# Patient Record
Sex: Male | Born: 1980 | Race: White | Hispanic: No | Smoking: Current every day smoker
Health system: Southern US, Community
[De-identification: ages and names within clinical notes are randomized; demographics above are authoritative.]

## PROBLEM LIST (undated history)

## (undated) DIAGNOSIS — I82409 Acute embolism and thrombosis of unspecified deep veins of unspecified lower extremity: Secondary | ICD-10-CM

## (undated) DIAGNOSIS — J449 Chronic obstructive pulmonary disease, unspecified: Secondary | ICD-10-CM

## (undated) HISTORY — PX: CORONARY ARTERY BYPASS GRAFT: SHX141

## (undated) HISTORY — PX: HERNIA REPAIR: SHX51

---

## 2002-07-24 ENCOUNTER — Encounter: Admission: RE | Admit: 2002-07-24 | Discharge: 2002-09-03 | Payer: Self-pay | Admitting: Orthopedic Surgery

## 2020-12-02 DIAGNOSIS — I361 Nonrheumatic tricuspid (valve) insufficiency: Secondary | ICD-10-CM

## 2021-01-27 ENCOUNTER — Emergency Department (HOSPITAL_COMMUNITY): Payer: Self-pay

## 2021-01-27 ENCOUNTER — Other Ambulatory Visit: Payer: Self-pay

## 2021-01-27 ENCOUNTER — Emergency Department (HOSPITAL_BASED_OUTPATIENT_CLINIC_OR_DEPARTMENT_OTHER): Payer: Self-pay

## 2021-01-27 ENCOUNTER — Inpatient Hospital Stay (HOSPITAL_COMMUNITY)
Admission: EM | Admit: 2021-01-27 | Discharge: 2021-03-01 | DRG: 216 | Disposition: A | Discharge status: Home Health | Payer: Self-pay | Attending: Surgery | Admitting: Surgery

## 2021-01-27 ENCOUNTER — Encounter (HOSPITAL_COMMUNITY): Payer: Self-pay | Admitting: Radiology

## 2021-01-27 DIAGNOSIS — K029 Dental caries, unspecified: Secondary | ICD-10-CM | POA: Diagnosis present

## 2021-01-27 DIAGNOSIS — Z597 Insufficient social insurance and welfare support: Secondary | ICD-10-CM

## 2021-01-27 DIAGNOSIS — T826XXA Infection and inflammatory reaction due to cardiac valve prosthesis, initial encounter: Secondary | ICD-10-CM

## 2021-01-27 DIAGNOSIS — D72829 Elevated white blood cell count, unspecified: Secondary | ICD-10-CM | POA: Diagnosis present

## 2021-01-27 DIAGNOSIS — R718 Other abnormality of red blood cells: Secondary | ICD-10-CM | POA: Diagnosis present

## 2021-01-27 DIAGNOSIS — E43 Unspecified severe protein-calorie malnutrition: Secondary | ICD-10-CM | POA: Diagnosis present

## 2021-01-27 DIAGNOSIS — R7989 Other specified abnormal findings of blood chemistry: Secondary | ICD-10-CM | POA: Insufficient documentation

## 2021-01-27 DIAGNOSIS — F17210 Nicotine dependence, cigarettes, uncomplicated: Secondary | ICD-10-CM | POA: Diagnosis present

## 2021-01-27 DIAGNOSIS — D509 Iron deficiency anemia, unspecified: Secondary | ICD-10-CM

## 2021-01-27 DIAGNOSIS — I440 Atrioventricular block, first degree: Secondary | ICD-10-CM | POA: Diagnosis present

## 2021-01-27 DIAGNOSIS — I083 Combined rheumatic disorders of mitral, aortic and tricuspid valves: Secondary | ICD-10-CM | POA: Diagnosis present

## 2021-01-27 DIAGNOSIS — I4510 Unspecified right bundle-branch block: Secondary | ICD-10-CM | POA: Diagnosis present

## 2021-01-27 DIAGNOSIS — Z86711 Personal history of pulmonary embolism: Secondary | ICD-10-CM

## 2021-01-27 DIAGNOSIS — E872 Acidosis, unspecified: Secondary | ICD-10-CM | POA: Diagnosis present

## 2021-01-27 DIAGNOSIS — Z20822 Contact with and (suspected) exposure to covid-19: Secondary | ICD-10-CM | POA: Diagnosis present

## 2021-01-27 DIAGNOSIS — T380X5A Adverse effect of glucocorticoids and synthetic analogues, initial encounter: Secondary | ICD-10-CM | POA: Diagnosis present

## 2021-01-27 DIAGNOSIS — Z720 Tobacco use: Secondary | ICD-10-CM

## 2021-01-27 DIAGNOSIS — D62 Acute posthemorrhagic anemia: Secondary | ICD-10-CM | POA: Diagnosis not present

## 2021-01-27 DIAGNOSIS — I2721 Secondary pulmonary arterial hypertension: Secondary | ICD-10-CM | POA: Diagnosis present

## 2021-01-27 DIAGNOSIS — I82409 Acute embolism and thrombosis of unspecified deep veins of unspecified lower extremity: Secondary | ICD-10-CM

## 2021-01-27 DIAGNOSIS — K036 Deposits [accretions] on teeth: Secondary | ICD-10-CM

## 2021-01-27 DIAGNOSIS — I5023 Acute on chronic systolic (congestive) heart failure: Secondary | ICD-10-CM | POA: Diagnosis present

## 2021-01-27 DIAGNOSIS — Z01818 Encounter for other preprocedural examination: Secondary | ICD-10-CM

## 2021-01-27 DIAGNOSIS — I2510 Atherosclerotic heart disease of native coronary artery without angina pectoris: Secondary | ICD-10-CM | POA: Diagnosis present

## 2021-01-27 DIAGNOSIS — I252 Old myocardial infarction: Secondary | ICD-10-CM

## 2021-01-27 DIAGNOSIS — J440 Chronic obstructive pulmonary disease with acute lower respiratory infection: Secondary | ICD-10-CM | POA: Diagnosis present

## 2021-01-27 DIAGNOSIS — I214 Non-ST elevation (NSTEMI) myocardial infarction: Principal | ICD-10-CM | POA: Diagnosis present

## 2021-01-27 DIAGNOSIS — I6340 Cerebral infarction due to embolism of unspecified cerebral artery: Secondary | ICD-10-CM | POA: Diagnosis not present

## 2021-01-27 DIAGNOSIS — R509 Fever, unspecified: Secondary | ICD-10-CM

## 2021-01-27 DIAGNOSIS — Z452 Encounter for adjustment and management of vascular access device: Secondary | ICD-10-CM

## 2021-01-27 DIAGNOSIS — J441 Chronic obstructive pulmonary disease with (acute) exacerbation: Secondary | ICD-10-CM | POA: Diagnosis present

## 2021-01-27 DIAGNOSIS — K0530 Chronic periodontitis, unspecified: Secondary | ICD-10-CM

## 2021-01-27 DIAGNOSIS — I059 Rheumatic mitral valve disease, unspecified: Secondary | ICD-10-CM

## 2021-01-27 DIAGNOSIS — I2699 Other pulmonary embolism without acute cor pulmonale: Secondary | ICD-10-CM

## 2021-01-27 DIAGNOSIS — I82433 Acute embolism and thrombosis of popliteal vein, bilateral: Secondary | ICD-10-CM

## 2021-01-27 DIAGNOSIS — N179 Acute kidney failure, unspecified: Secondary | ICD-10-CM | POA: Diagnosis present

## 2021-01-27 DIAGNOSIS — E875 Hyperkalemia: Secondary | ICD-10-CM | POA: Diagnosis present

## 2021-01-27 DIAGNOSIS — I708 Atherosclerosis of other arteries: Secondary | ICD-10-CM | POA: Diagnosis present

## 2021-01-27 DIAGNOSIS — R011 Cardiac murmur, unspecified: Secondary | ICD-10-CM | POA: Diagnosis present

## 2021-01-27 DIAGNOSIS — Z8249 Family history of ischemic heart disease and other diseases of the circulatory system: Secondary | ICD-10-CM

## 2021-01-27 DIAGNOSIS — L8990 Pressure ulcer of unspecified site, unspecified stage: Secondary | ICD-10-CM | POA: Diagnosis present

## 2021-01-27 DIAGNOSIS — J4520 Mild intermittent asthma, uncomplicated: Secondary | ICD-10-CM

## 2021-01-27 DIAGNOSIS — J90 Pleural effusion, not elsewhere classified: Secondary | ICD-10-CM

## 2021-01-27 DIAGNOSIS — J45909 Unspecified asthma, uncomplicated: Secondary | ICD-10-CM

## 2021-01-27 DIAGNOSIS — K045 Chronic apical periodontitis: Secondary | ICD-10-CM | POA: Diagnosis present

## 2021-01-27 DIAGNOSIS — E871 Hypo-osmolality and hyponatremia: Secondary | ICD-10-CM | POA: Diagnosis present

## 2021-01-27 DIAGNOSIS — R197 Diarrhea, unspecified: Secondary | ICD-10-CM | POA: Diagnosis present

## 2021-01-27 DIAGNOSIS — I255 Ischemic cardiomyopathy: Secondary | ICD-10-CM | POA: Diagnosis present

## 2021-01-27 DIAGNOSIS — K08109 Complete loss of teeth, unspecified cause, unspecified class: Secondary | ICD-10-CM

## 2021-01-27 DIAGNOSIS — E8809 Other disorders of plasma-protein metabolism, not elsewhere classified: Secondary | ICD-10-CM | POA: Diagnosis present

## 2021-01-27 DIAGNOSIS — I639 Cerebral infarction, unspecified: Secondary | ICD-10-CM

## 2021-01-27 DIAGNOSIS — J9601 Acute respiratory failure with hypoxia: Secondary | ICD-10-CM | POA: Diagnosis present

## 2021-01-27 DIAGNOSIS — I330 Acute and subacute infective endocarditis: Secondary | ICD-10-CM

## 2021-01-27 DIAGNOSIS — R570 Cardiogenic shock: Secondary | ICD-10-CM | POA: Diagnosis not present

## 2021-01-27 DIAGNOSIS — J449 Chronic obstructive pulmonary disease, unspecified: Secondary | ICD-10-CM

## 2021-01-27 DIAGNOSIS — R778 Other specified abnormalities of plasma proteins: Secondary | ICD-10-CM

## 2021-01-27 DIAGNOSIS — I472 Ventricular tachycardia, unspecified: Secondary | ICD-10-CM | POA: Diagnosis present

## 2021-01-27 DIAGNOSIS — I248 Other forms of acute ischemic heart disease: Secondary | ICD-10-CM | POA: Insufficient documentation

## 2021-01-27 DIAGNOSIS — Z951 Presence of aortocoronary bypass graft: Secondary | ICD-10-CM

## 2021-01-27 DIAGNOSIS — Z6821 Body mass index (BMI) 21.0-21.9, adult: Secondary | ICD-10-CM

## 2021-01-27 DIAGNOSIS — I4729 Other ventricular tachycardia: Secondary | ICD-10-CM

## 2021-01-27 DIAGNOSIS — I38 Endocarditis, valve unspecified: Secondary | ICD-10-CM

## 2021-01-27 DIAGNOSIS — R17 Unspecified jaundice: Secondary | ICD-10-CM

## 2021-01-27 DIAGNOSIS — Z8673 Personal history of transient ischemic attack (TIA), and cerebral infarction without residual deficits: Secondary | ICD-10-CM

## 2021-01-27 DIAGNOSIS — K030 Excessive attrition of teeth: Secondary | ICD-10-CM | POA: Diagnosis present

## 2021-01-27 DIAGNOSIS — Z7901 Long term (current) use of anticoagulants: Secondary | ICD-10-CM

## 2021-01-27 DIAGNOSIS — K000 Anodontia: Secondary | ICD-10-CM | POA: Diagnosis present

## 2021-01-27 DIAGNOSIS — Z91041 Radiographic dye allergy status: Secondary | ICD-10-CM

## 2021-01-27 DIAGNOSIS — I5021 Acute systolic (congestive) heart failure: Secondary | ICD-10-CM

## 2021-01-27 DIAGNOSIS — K083 Retained dental root: Secondary | ICD-10-CM | POA: Diagnosis present

## 2021-01-27 DIAGNOSIS — I82403 Acute embolism and thrombosis of unspecified deep veins of lower extremity, bilateral: Secondary | ICD-10-CM

## 2021-01-27 DIAGNOSIS — Z86718 Personal history of other venous thrombosis and embolism: Secondary | ICD-10-CM

## 2021-01-27 DIAGNOSIS — Z01811 Encounter for preprocedural respiratory examination: Secondary | ICD-10-CM

## 2021-01-27 DIAGNOSIS — I110 Hypertensive heart disease with heart failure: Secondary | ICD-10-CM | POA: Diagnosis present

## 2021-01-27 DIAGNOSIS — I25810 Atherosclerosis of coronary artery bypass graft(s) without angina pectoris: Secondary | ICD-10-CM | POA: Diagnosis present

## 2021-01-27 DIAGNOSIS — Z952 Presence of prosthetic heart valve: Secondary | ICD-10-CM

## 2021-01-27 DIAGNOSIS — I340 Nonrheumatic mitral (valve) insufficiency: Secondary | ICD-10-CM

## 2021-01-27 DIAGNOSIS — J189 Pneumonia, unspecified organism: Secondary | ICD-10-CM | POA: Diagnosis present

## 2021-01-27 HISTORY — DX: Acute embolism and thrombosis of unspecified deep veins of unspecified lower extremity: I82.409

## 2021-01-27 HISTORY — DX: Chronic obstructive pulmonary disease, unspecified: J44.9

## 2021-01-27 LAB — COMPREHENSIVE METABOLIC PANEL
ALT: 28 U/L (ref 0–44)
AST: 35 U/L (ref 15–41)
Albumin: 2.7 g/dL — ABNORMAL LOW (ref 3.5–5.0)
Alkaline Phosphatase: 101 U/L (ref 38–126)
Anion gap: 12 (ref 5–15)
BUN: 23 mg/dL — ABNORMAL HIGH (ref 6–20)
CO2: 22 mmol/L (ref 22–32)
Calcium: 8.2 mg/dL — ABNORMAL LOW (ref 8.9–10.3)
Chloride: 99 mmol/L (ref 98–111)
Creatinine, Ser: 1.39 mg/dL — ABNORMAL HIGH (ref 0.61–1.24)
GFR, Estimated: >60 mL/min (ref >60)
Glucose, Bld: 119 mg/dL — ABNORMAL HIGH (ref 70–99)
Potassium: 3.5 mmol/L (ref 3.5–5.1)
Sodium: 133 mmol/L — ABNORMAL LOW (ref 135–145)
Total Bilirubin: 6.3 mg/dL — ABNORMAL HIGH (ref 0.3–1.2)
Total Protein: 5.5 g/dL — ABNORMAL LOW (ref 6.5–8.1)

## 2021-01-27 LAB — URINALYSIS, ROUTINE W REFLEX MICROSCOPIC
Bilirubin Urine: NEGATIVE
Glucose, UA: NEGATIVE mg/dL
Hgb urine dipstick: NEGATIVE
Ketones, ur: NEGATIVE mg/dL
Leukocytes,Ua: NEGATIVE
Nitrite: NEGATIVE
Protein, ur: NEGATIVE mg/dL
Specific Gravity, Urine: 1.006 (ref 1.005–1.030)
pH: 6 (ref 5.0–8.0)

## 2021-01-27 LAB — CBC
HCT: 36.9 % — ABNORMAL LOW (ref 39.0–52.0)
Hemoglobin: 10.1 g/dL — ABNORMAL LOW (ref 13.0–17.0)
MCH: 19.2 pg — ABNORMAL LOW (ref 26.0–34.0)
MCHC: 27.4 g/dL — ABNORMAL LOW (ref 30.0–36.0)
MCV: 70.3 fL — ABNORMAL LOW (ref 80.0–100.0)
Platelets: 195 10*3/uL (ref 150–400)
RBC: 5.25 MIL/uL (ref 4.22–5.81)
RDW: 22.8 % — ABNORMAL HIGH (ref 11.5–15.5)
WBC: 12.6 10*3/uL — ABNORMAL HIGH (ref 4.0–10.5)
nRBC: 0.3 % — ABNORMAL HIGH (ref 0.0–0.2)

## 2021-01-27 LAB — LACTIC ACID, PLASMA
Lactic Acid, Venous: 3.7 mmol/L (ref 0.5–1.9)
Lactic Acid, Venous: 3.8 mmol/L (ref 0.5–1.9)
Lactic Acid, Venous: 2.5 mmol/L (ref 0.5–1.9)

## 2021-01-27 LAB — BRAIN NATRIURETIC PEPTIDE: B Natriuretic Peptide: 1733 pg/mL — ABNORMAL HIGH (ref 0.0–100.0)

## 2021-01-27 LAB — TROPONIN I (HIGH SENSITIVITY)
Troponin I (High Sensitivity): 99 ng/L — ABNORMAL HIGH (ref <18)
Troponin I (High Sensitivity): 108 ng/L (ref <18)
Troponin I (High Sensitivity): 105 ng/L (ref <18)

## 2021-01-27 LAB — RESP PANEL BY RT-PCR (FLU A&B, COVID) ARPGX2
Influenza A by PCR: NEGATIVE
Influenza B by PCR: NEGATIVE
SARS Coronavirus 2 by RT PCR: NEGATIVE

## 2021-01-27 LAB — PROTIME-INR
INR: 2.5 — ABNORMAL HIGH (ref 0.8–1.2)
Prothrombin Time: 26.7 seconds — ABNORMAL HIGH (ref 11.4–15.2)

## 2021-01-27 MED ORDER — BOOST / RESOURCE BREEZE PO LIQD CUSTOM
1.0000 | Freq: Three times a day (TID) | ORAL | Status: DC
  Administered 2021-01-28: 1 via ORAL
  Filled 2021-01-27: qty 1

## 2021-01-27 MED ORDER — METHYLPREDNISOLONE SODIUM SUCC 40 MG IJ SOLR
40.0000 mg | Freq: Two times a day (BID) | INTRAMUSCULAR | Status: DC
  Administered 2021-01-27 – 2021-01-28 (×2): 40 mg via INTRAVENOUS
  Filled 2021-01-27 (×2): qty 1

## 2021-01-27 MED ORDER — SODIUM CHLORIDE 0.9 % IV SOLN
500.0000 mg | INTRAVENOUS | Status: DC
  Administered 2021-01-27: 500 mg via INTRAVENOUS
  Filled 2021-01-27 (×2): qty 500

## 2021-01-27 MED ORDER — DIPHENHYDRAMINE HCL 25 MG PO CAPS
50.0000 mg | ORAL_CAPSULE | Freq: Once | ORAL | Status: AC
  Administered 2021-01-27: 50 mg via ORAL
  Filled 2021-01-27: qty 2

## 2021-01-27 MED ORDER — METHYLPREDNISOLONE SODIUM SUCC 125 MG IJ SOLR
125.0000 mg | Freq: Once | INTRAMUSCULAR | Status: AC
  Administered 2021-01-27: 125 mg via INTRAVENOUS
  Filled 2021-01-27: qty 2

## 2021-01-27 MED ORDER — HEPARIN BOLUS VIA INFUSION
5100.0000 [IU] | Freq: Once | INTRAVENOUS | Status: AC
  Administered 2021-01-27: 5100 [IU] via INTRAVENOUS
  Filled 2021-01-27: qty 5100

## 2021-01-27 MED ORDER — FUROSEMIDE 10 MG/ML IJ SOLN
40.0000 mg | Freq: Two times a day (BID) | INTRAMUSCULAR | Status: DC
  Administered 2021-01-28 – 2021-01-30 (×5): 40 mg via INTRAVENOUS
  Filled 2021-01-27 (×5): qty 4

## 2021-01-27 MED ORDER — SODIUM CHLORIDE 0.9 % IV SOLN
1.0000 g | INTRAVENOUS | Status: DC
  Filled 2021-01-27: qty 10

## 2021-01-27 MED ORDER — METHYLPREDNISOLONE SODIUM SUCC 125 MG IJ SOLR
75.0000 mg | Freq: Once | INTRAMUSCULAR | Status: AC
  Administered 2021-01-27: 75 mg via INTRAVENOUS
  Filled 2021-01-27: qty 2

## 2021-01-27 MED ORDER — FUROSEMIDE 10 MG/ML IJ SOLN
20.0000 mg | Freq: Once | INTRAMUSCULAR | Status: AC
  Administered 2021-01-27: 20 mg via INTRAVENOUS
  Filled 2021-01-27: qty 2

## 2021-01-27 MED ORDER — FUROSEMIDE 10 MG/ML IJ SOLN
40.0000 mg | Freq: Once | INTRAMUSCULAR | Status: AC
  Administered 2021-01-27: 40 mg via INTRAVENOUS
  Filled 2021-01-27: qty 4

## 2021-01-27 MED ORDER — SODIUM CHLORIDE 0.9 % IV SOLN
1.0000 g | Freq: Once | INTRAVENOUS | Status: AC
  Administered 2021-01-27: 1 g via INTRAVENOUS
  Filled 2021-01-27: qty 10

## 2021-01-27 MED ORDER — HEPARIN (PORCINE) 25000 UT/250ML-% IV SOLN
1500.0000 [IU]/h | INTRAVENOUS | Status: DC
  Administered 2021-01-27: 1250 [IU]/h via INTRAVENOUS
  Administered 2021-01-28: 1350 [IU]/h via INTRAVENOUS
  Filled 2021-01-27 (×2): qty 250

## 2021-01-27 MED ORDER — PANTOPRAZOLE SODIUM 40 MG PO TBEC
40.0000 mg | DELAYED_RELEASE_TABLET | Freq: Every day | ORAL | Status: DC
  Administered 2021-01-27 – 2021-02-16 (×21): 40 mg via ORAL
  Filled 2021-01-27 (×21): qty 1

## 2021-01-27 MED ORDER — IPRATROPIUM-ALBUTEROL 0.5-2.5 (3) MG/3ML IN SOLN
3.0000 mL | Freq: Once | RESPIRATORY_TRACT | Status: AC
  Administered 2021-01-27: 3 mL via RESPIRATORY_TRACT
  Filled 2021-01-27: qty 3

## 2021-01-27 MED ORDER — IOHEXOL 350 MG/ML SOLN
75.0000 mL | Freq: Once | INTRAVENOUS | Status: AC | PRN
  Administered 2021-01-27: 75 mL via INTRAVENOUS

## 2021-01-27 MED ORDER — DM-GUAIFENESIN ER 30-600 MG PO TB12
1.0000 | ORAL_TABLET | Freq: Two times a day (BID) | ORAL | Status: DC
  Administered 2021-01-27 – 2021-02-16 (×40): 1 via ORAL
  Filled 2021-01-27 (×43): qty 1

## 2021-01-27 NOTE — ED Triage Notes (Signed)
Pt arrives via EMS for general weakness and SOB x several days. Pt reports n/v/d x 3 days. States he hasn't eaten in 3 days because he was unable to get to any food. Also c/o throbbing in left arm last night and states he almost passed out on the toilet.

## 2021-01-27 NOTE — H&P (Signed)
History and Physical  Mark Garrett ZDG:[id number] DOB: [birth year] DOA: 01/27/2021  Referring physician: Dwaine Gale, DO PCP: Pcp, No  Patient coming from: Home  Chief Complaint: Shortness of breath and weakness  HPI: Mark Garrett is a 40 y.o. male with medical history significant for asthma/COPD, DVT, tobacco abuse who presents to the emergency department due to 3-day onset of increasing shortness of breath and chest pain, he complained of left-sided throbbing chest pain yesterday with radiation to the left shoulder and arm.  He complained of 3-day onset of leg swelling and states that he ran out of his Xarelto few days ago.  He complained of cough with occasional sputum production and uncertain sputum:, Patient also complained of 3-day onset of diarrhea with several episodes daily.  EMS was activated, on arrival of EMS team, he was noted to be hypotensive and IV fluids was given en route with some improvement (per ED medical record).  He denies fever, chills, nausea, vomiting, headache, abdominal pain.  ED Course:  In the emergency department, he was hemodynamically stable, but O2 sat was 87% on room air and patient was provided with supplemental oxygen via [place] at 4 LPM to maintain O2 sat of 92-100%.  Work-up in the ED showed leukocytosis, microcytic anemia, hyponatremia, BUN/creatinine 23/1.39 (no prior labs for comparison).  Calcium 8.2, albumin 2.7, T bili 6.3 BNP 1733, troponin x2-99 > 108.  Lactic acid - 3.7 > 3.8, urinalysis was negative, influenza A, B, SARS coronavirus 2 was negative. Chest x-ray showed persistent patchy airspace opacities in the right lower lung, overall increased primary differential considerations include infection or alveolar hemorrhage given findings on prior CT. Probable small right pleural effusion with fluid tracking along the fissure. Bilateral lower extremity ultrasound showed age indeterminate DVT CT angiography chest with contrast showed no evidence of  central pulmonary embolism.Right middle lobe and multiple small bilateral lower lobe opacities concerning for pneumonia. Moderate right pleural effusion. Patient was empirically started on IV ceftriaxone and azithromycin due to presumed CAP, IV Lasix was given, breathing treatment was provided, Solu-Medrol was given.  Patient was started on a heparin drip.  Hospitalist was asked to admit patient for further evaluation and management.  Review of Systems: Constitutional: Negative for chills and fever.  HENT: Negative for ear pain and sore throat.   Eyes: Negative for pain and visual disturbance.  Respiratory: Positive for cough and shortness of breath.   Cardiovascular: Positive for chest pain and leg swelling.  Negative for palpitations.  Gastrointestinal: Positive for diarrhea.  Negative for abdominal pain and vomiting.  Endocrine: Negative for polyphagia and polyuria.  Genitourinary: Negative for decreased urine volume, dysuria, enuresis Musculoskeletal: Negative for arthralgias and back pain.  Skin: Negative for color change and rash.  Allergic/Immunologic: Negative for immunocompromised state.  Neurological: Positive for weakness.  Negative for tremors, speech difficulty and headaches.  Hematological: Does not bruise/bleed easily.  All other systems reviewed and are negative   Past medical history: Asthma/COPD DVT  Social History: Tobacco abuse   Allergies  Allergen Reactions   Iodide Rash   Family history: Mom had diabetes and anxiety It was not sure of father's medical history Patient was the only child  Prior to Admission medications   Not on File    Physical Exam: BP (!) 114/91 (BP Location: Right Arm)   Pulse 91   Temp 98 F (36.7 C) (Oral)   Resp 20   Ht 5\' 11"  (1.803 m)   Wt 72.6 kg  SpO2 94%   BMI 22.32 kg/m   General: 40 y.o. year-old male ill-appearing but in no acute distress.  Alert and oriented x3. HEENT: NCAT, EOMI Neck: Supple, trachea  medial Cardiovascular: Tachycardia.  Regular rate and rhythm with no rubs or gallops.  No thyromegaly or JVD noted.  Bilateral lower extremity edema. 2/4 pulses in all 4 extremities. Respiratory: Rales in right lower lobe on auscultation.  Mild scattered wheezing.   Abdomen: Soft, nontender nondistended with normal bowel sounds x4 quadrants. Muskuloskeletal: No cyanosis or clubbing.  Bilateral lower extremity edema  Neuro: CN II-XII intact, strength 5/5 x 4, sensation, reflexes intact Skin: No ulcerative lesions noted or rashes Psychiatry: Judgement and insight appear normal. Mood is appropriate for condition and setting          Labs on Admission:  Basic Metabolic Panel: Recent Labs  Lab 01/27/21 1032  NA 133*  K 3.5  CL 99  CO2 22  GLUCOSE 119*  BUN 23*  CREATININE 1.39*  CALCIUM 8.2*   Liver Function Tests: Recent Labs  Lab 01/27/21 1032  AST 35  ALT 28  ALKPHOS 101  BILITOT 6.3*  PROT 5.5*  ALBUMIN 2.7*   No results for input(s): LIPASE, AMYLASE in the last 168 hours. No results for input(s): AMMONIA in the last 168 hours. CBC: Recent Labs  Lab 01/27/21 1032  WBC 12.6*  HGB 10.1*  HCT 36.9*  MCV 70.3*  PLT 195   Cardiac Enzymes: No results for input(s): CKTOTAL, CKMB, CKMBINDEX, TROPONINI in the last 168 hours.  BNP (last 3 results) Recent Labs    01/27/21 1034  BNP 1,733.0*    ProBNP (last 3 results) No results for input(s): PROBNP in the last 8760 hours.  CBG: No results for input(s): GLUCAP in the last 168 hours.  Radiological Exams on Admission: DG Chest 2 View  Result Date: 01/27/2021 CLINICAL DATA:  sob, chest pain EXAM: CHEST - 2 VIEW COMPARISON:  CT chest 12/02/2020. FINDINGS: Persistent patchy airspace opacities in the right lower lung, overall increased. Probable small right pleural effusion with fluid tracking along the fissure. No visible pneumothorax. Pulmonary nodules were better characterized on prior CT chest. Enlarged cardiac  silhouette. Median sternotomy. No evidence of acute osseous abnormality. IMPRESSION: 1. Persistent patchy airspace opacities in the right lower lung, overall increased. Primary differential considerations include infection or alveolar hemorrhage given findings on prior CT. Probable small right pleural effusion with fluid tracking along the fissure. 2. Cardiomegaly. 3. Pulmonary nodules were better characterized on prior CT chest. Electronically Signed   By: Margaretha Sheffield M.D.   On: 01/27/2021 11:54   CT Angio Chest PE W and/or Wo Contrast  Result Date: 01/27/2021 CLINICAL DATA:  Shortness of breath EXAM: CT ANGIOGRAPHY CHEST WITH CONTRAST TECHNIQUE: Multidetector CT imaging of the chest was performed using the standard protocol during bolus administration of intravenous contrast. Multiplanar CT image reconstructions and MIPs were obtained to evaluate the vascular anatomy. CONTRAST:  69mL OMNIPAQUE IOHEXOL 350 MG/ML SOLN COMPARISON:  CT examination dated September 15, the UB hemangioma FINDINGS: Cardiovascular: Satisfactory opacification of the central pulmonary arteries. There is heterogeneous appearance of the distal segmental and subsegmental pulmonary arteries which could be secondary to chronic thromboembolism and/or mixing artifact. No evidence of central pulmonary embolism. Heart is enlarged. Main pulmonary trunk is dilated measuring up to 3.4 cm concerning for pulmonary arterial hypertension. There is also reflux of contrast into the hepatic veins concerning for right heart failure. No pericardial effusion. Mediastinum/Nodes: No enlarged  mediastinal, hilar, or axillary lymph nodes. Thyroid gland, trachea, and esophagus demonstrate no significant findings. Lungs/Pleura: There is consolidation in the peripheral aspect of the right middle lobe. There are patchy and ground-glass opacities in the right lower lobe. There is a large right pleural effusion. The there are also patchy and ground-glass  opacities in the left lower lobe. Upper Abdomen: No acute abnormality. Musculoskeletal: Sternotomy wires are noted. Review of the MIP images confirms the above findings. IMPRESSION: 1. No evidence of central pulmonary embolism. Evaluation of distal segmental and subsegmental arteries is limited, which may be secondary to chronic thromboembolism and/or mixing artifact. 2. Right middle lobe and multiple small bilateral lower lobe opacities concerning for pneumonia. Moderate right pleural effusion. Follow-up examination to resolution is recommended. Electronically Signed   By: Keane Police D.O.   On: 01/27/2021 16:57   VAS [place] LOWER EXTREMITY VENOUS (DVT) (7a-7p)  Result Date: 01/27/2021  Lower Venous DVT Study Patient Name:  DURELL VITARELLI  Date of Exam:   01/27/2021 Medical Rec #: [id number]        Accession #:    [id number] Date of Birth: 02/15/1981         Patient Gender: M Patient Age:   41 years Exam Location:  [facility] Procedure:      VAS [place] LOWER EXTREMITY VENOUS (DVT) Referring Phys: ERICA CONKLIN --------------------------------------------------------------------------------  Indications: Pulmonary embolism.  Comparison Study: no prior Performing Technologist: Archie Patten RVS  Examination Guidelines: A complete evaluation includes B-mode imaging, spectral Doppler, color Doppler, and power Doppler as needed of all accessible portions of each vessel. Bilateral testing is considered an integral part of a complete examination. Limited examinations for reoccurring indications may be performed as noted. The reflux portion of the exam is performed with the patient in reverse Trendelenburg.  +---------+---------------+---------+-----------+----------+-------------------+ RIGHT    CompressibilityPhasicitySpontaneityPropertiesThrombus Aging      +---------+---------------+---------+-----------+----------+-------------------+ CFV      Full           Yes      Yes                                       +---------+---------------+---------+-----------+----------+-------------------+ SFJ      Full                                                             +---------+---------------+---------+-----------+----------+-------------------+ FV Prox  Full                                                             +---------+---------------+---------+-----------+----------+-------------------+ FV Mid   Full                                                             +---------+---------------+---------+-----------+----------+-------------------+ FV DistalFull                                                             +---------+---------------+---------+-----------+----------+-------------------+  PFV      Full                                                             +---------+---------------+---------+-----------+----------+-------------------+ POP      None           No       No                   Age Indeterminate   +---------+---------------+---------+-----------+----------+-------------------+ PTV      None                                         Age indeterminate                                                         in a single ptv     +---------+---------------+---------+-----------+----------+-------------------+ PERO                                                  Not well visualized +---------+---------------+---------+-----------+----------+-------------------+   +---------+---------------+---------+-----------+----------+-------------------+ LEFT     CompressibilityPhasicitySpontaneityPropertiesThrombus Aging      +---------+---------------+---------+-----------+----------+-------------------+ CFV      Full           Yes      Yes                                      +---------+---------------+---------+-----------+----------+-------------------+ SFJ      Full                                                              +---------+---------------+---------+-----------+----------+-------------------+ FV Prox  Full                                                             +---------+---------------+---------+-----------+----------+-------------------+ FV Mid   Full                                                             +---------+---------------+---------+-----------+----------+-------------------+ FV Distal               Yes      Yes                                      +---------+---------------+---------+-----------+----------+-------------------+  PFV      Full                                                             +---------+---------------+---------+-----------+----------+-------------------+ POP      None           No       No                   Age Indeterminate   +---------+---------------+---------+-----------+----------+-------------------+ PTV      Full                                                             +---------+---------------+---------+-----------+----------+-------------------+ PERO                                                  Not well visualized +---------+---------------+---------+-----------+----------+-------------------+     Summary: BILATERAL: -No evidence of popliteal cyst, bilaterally. RIGHT: - Findings consistent with age indeterminate deep vein thrombosis involving the right popliteal vein, and right posterior tibial veins.  LEFT: - Findings consistent with age indeterminate deep vein thrombosis involving the left popliteal vein.  *See table(s) above for measurements and observations. Electronically signed by Monica Martinez MD on 01/27/2021 at 3:40:07 PM.    Final     EKG: I independently viewed the EKG done and my findings are as followed: Sinus tachycardia at rate of 102 bpm with VPC and incomplete RBBB  Assessment/Plan Present on Admission:  CAP (community acquired pneumonia)  Principal Problem:   CAP (community  acquired pneumonia) Active Problems:   Acute respiratory failure with hypoxia (HCC)   Pleural effusion on right   Leukocytosis   Microcytic anemia   Hypoalbuminemia   Elevated brain natriuretic peptide (BNP) level   Elevated troponin   Lactic acidosis   DVT (deep venous thrombosis) (HCC)   Diarrhea   Tobacco abuse   COPD (chronic obstructive pulmonary disease) ([place])   Asthma  Acute respiratory failure with hypoxia possibly secondary to community-acquired pneumonia with superimposed mild exacerbation of COPD POA Chest x-ray and CT of chest suggestive of pneumonia Patient was started on IV ceftriaxone and azithromycin, we shall continue with same at this time with plan to de-escalate/discontinue based on blood culture, sputum culture, urine Legionella, strep pneumo and procalcitonin Continue Mucinex,  Continue  incentive spirometry, flutter valve and chest PT Continue supplemental oxygen via [place] to maintain O2 sats > 92% with plan to wean patient off supplemental oxygen as tolerated (patient does not use oxygen at baseline).  Lactic acidosis possibly secondary to hypoxia Lactic acid 3.7 > 3.8 > 2.5 Continue supplemental oxygen as described above Continue to trend lactic acid  Right pleural effusion Elevated BNP rule out CHF BNP 1,733.  Patient complained of 3-day onset of leg swelling Continue total input/output, daily weights and fluid restriction Continue IV Lasix 40 twice daily (as tolerated by BP), consider possible thoracentesis for persistent pleural effusion unresponsive to diuretics Continue Cardiac diet  Echocardiogram will be done in the morning   Chest pain rule out ACS Elevated troponin possibly due to type II demand ischemia Patient complained of chest pain with radiation to left shoulder and left arm which has since resolved Troponin x2-108 > 105 He was started on IV heparin drip Continue telemetry, EKG personally reviewed showed sinus tachycardia at a rate of 102  bpm with VPC and incomplete RBBBt Cardiology will be consulted to help decide if Stress test is needed in am Versus other diagnostic modalities.     DVT Bilateral lower extremity ultrasound showed DVT Patient was started on IV heparin drip in the ED; patient run out of home Xarelto a few days ago Consider transitioning patient back to Xarelto in the morning  Acute diarrhea C. Diff be checked  Leukocytosis possibly secondary to multifactorial including pneumonia, steroid effect or reactive Continue treatment as described for pneumonia  Elevated total bilirubin T bili 6.3, liver enzymes were normal.  Direct bilirubin will be checked  Microcytic anemia Iron studies will be checked  Hypoalbuminemia possibly secondary to moderate protein calorie malnutrition Albumin 2.7.  Protein supplement to be provided  Tobacco abuse Patient has 14-pack-year smoking history, he continues to smoke Patient was counseled on tobacco abuse cessation  DVT prophylaxis: Heparin drip  Code Status: Full code  Family Communication: None at bedside  Disposition Plan:  Patient is from:                        home Anticipated DC to:                   SNF or family members home Anticipated DC date:               2-3 days Anticipated DC barriers:          Patient requires inpatient management due to respiratory failure with hypoxia requiring oxygen, Community-acquired pneumonia requiring IV antibiotics, right pleural effusion with elevated BNP pending work-up for CHF rule out  Consults called: Cardiology  Admission status: Inpatient    Frankey Shown MD [facility]  01/27/2021, 9:08 PM

## 2021-01-27 NOTE — ED Provider Notes (Signed)
  Physical Exam  BP 120/89   Pulse (!) 102   Temp 98.3 F (36.8 C) (Oral)   Resp 20   Ht 5\' 11"  (1.803 m)   Wt 72.6 kg   SpO2 92%   BMI 22.32 kg/m   Physical Exam  ED Course/Procedures     Procedures  MDM  Care of patient assumed from previous provider at shift change. Please refer to their note for further history and plan.  In brief, patient is a 40 y.o. y/o male presenting with: - CHF exacerbation, SOB, missed Xarelto - PMHx: No past medical history on file.  Review of ED course: - 3:15 ready for CTA, contrast allergy - heparin ordered, likely PE - found to have DVTs BL   Plan at the time of handoff is as follows: - f/u pending CT   Additional MDM:  VS upon my assumption of care were sig for tachycardia.  Delta troponin and repeat lactic acid pending.  Imaging: CTA negative for PE, notable for multifocal pneumonia with moderate right pleural effusion. Imaging reviewed by radiology and personally by me.  Medications: Medications  ipratropium-albuterol (DUONEB) 0.5-2.5 (3) MG/3ML nebulizer solution 3 mL (3 mLs Nebulization Given 01/27/21 1115)  methylPREDNISolone sodium succinate (SOLU-MEDROL) 125 mg/2 mL injection 125 mg (125 mg Intravenous Given 01/27/21 1115)  furosemide (LASIX) injection 20 mg (20 mg Intravenous Given 01/27/21 1242)  methylPREDNISolone sodium succinate (SOLU-MEDROL) 125 mg/2 mL injection 75 mg (75 mg Intravenous Given 01/27/21 1355)  diphenhydrAMINE (BENADRYL) capsule 50 mg (50 mg Oral Given 01/27/21 1416)   Patient re-evaluated prior to admission. Hemodynamically stable and in no acute distress.  Patient is ill-appearing and remains on 2 L supplemental oxygen.  Given coverage for CAP.  Admitted to hospitalist in serious but stable condition.  Continue heparin drip for presumed new or worsening DVTs.  Continue supplemental oxygen as needed.  Further management per accepting team.  Patient understands and agrees with the plan.  The plan for  this patient was discussed with my attending physician, who voiced agreement and who oversaw evaluation and treatment of this patient.     Note: 13/10/22 was used in the creation of this note.   1. Deep vein thrombosis (DVT) of popliteal vein of both lower extremities, unspecified chronicity (HCC)         Tyrus Wilms, Damar Petit, DO 01/27/21 13/10/22    [id number], MD 01/28/21 1411

## 2021-01-27 NOTE — Progress Notes (Signed)
ANTICOAGULATION CONSULT NOTE - Initial Consult  Pharmacy Consult for heparin Indication: DVT  Allergies  Allergen Reactions   Iodide Rash    Patient Measurements: Height: 5\' 11"  (180.3 cm) Weight: 72.6 kg (160 lb) IBW/kg (Calculated) : 75.3 Heparin Dosing Weight: 72.6 kg  Vital Signs: Temp: 98.3 F (36.8 C) (11/10 1007) Temp Source: Oral (11/10 1007) BP: 143/106 (11/10 1515) Pulse Rate: 100 (11/10 1520)  Labs: Recent Labs    01/27/21 1032 01/27/21 1245  HGB 10.1*  --   HCT 36.9*  --   PLT 195  --   CREATININE 1.39*  --   TROPONINIHS 99* 108*    Estimated Creatinine Clearance: 71.1 mL/min (A) (by C-G formula based on SCr of 1.39 mg/dL (H)).   Medical History: No past medical history on file.  Medications: see MAR  Assessment: 40 yo M with hx of DVT but was no longer taking treatment - coming in with SOB - PE study pending, found to have bilateral DVT on doppler exam. No AC PTA. CBC ok.  Goal of Therapy:  Heparin level 0.3-0.7 units/ml Monitor platelets by anticoagulation protocol: Yes   Plan:  Give 5100 units bolus x 1 Start heparin infusion at 1250 units/hr Check anti-Xa level in 6 hours and daily while on heparin Continue to monitor H&H and platelets F/u plan for PO AC prior to d/c  45, PharmD, [facility] Emergency Medicine Clinical Pharmacist ED RPh Phone: [phone] Main RX: [phone]

## 2021-01-27 NOTE — ED Notes (Signed)
Patient transported to CT 

## 2021-01-27 NOTE — ED Provider Notes (Addendum)
Patient seen here conjunction with orienting APP, Conklin PA-C.  40 year old known history of DVT not currently anticoagulated here for evaluation of chest pain, shortness of breath and hypotension.  Was noted to be hypotensive in the field.  Improved with some IV fluids.  On arrival hypoxic requiring nasal cannula however appears comfortable.  He has bilateral lower extremity swelling.  No known history of CHF per patient.  No prior records to review in epic.  Does have history of COPD had some wheeze on exam was subsequently given steroids and breathing treatment which improved his wheeze.  He still remained hypoxic requiring 4 L.  Given history of recent VTE question high clinical suspicion for pulmonary embolism.  We will plan on labs and imaging  Labs and imaging personally reviewed and interpreted:  Elevated troponin, up trending Lactic acid 3.7 however low suspicion for sepsis at this time. Will hold on Abx, IVF at this time given suspected obstructive process and obvious fluid overload on exam. No hypotension at this time BNP 1733 CBC leukocytosis Metabolic panel creatinine 1.39, no prior to compare.  Does have T bili 6.3, no prior to compare denies any abdominal pain. Unclear etiology Chest xray with possible alveolar hemorrhage vs infiltrates and pleural effusion  Unfortunately patient with contrast dye allergy.  Initially was given 125 Solu-Medrol for COPD exacerbation 1115, radiology tech requesting additional 75 mg Solu-Medrol now as well as 50 mg Benadryl at 1415 for CTA Chest at 1515  Given bilateral DVT on imaging and suspected PE patient started on heparin in ED.  Discussed plan with patient. Agreeable for admission.  Patient critically ill with multiorgan system failure, suspect PE. Question infiltrate vs fluid overload?  Remaining workup will be followed by oncoming provider who will FU on repeat lactic and CTA results. Dipo pending  Suspect will need admission for further  management  CRITICAL CARE Performed by: Thoms Barthelemy A Marne Meline Total critical care time: 31 minutes Critical care time was exclusive of separately billable procedures and treating other patients. Critical care was necessary to treat or prevent imminent or life-threatening deterioration. Critical care was time spent personally by me on the following activities: development of treatment plan with patient and/or surrogate as well as nursing, discussions with consultants, evaluation of patient's response to treatment, examination of patient, obtaining history from patient or surrogate, ordering and performing treatments and interventions, ordering and review of laboratory studies, ordering and review of radiographic studies, pulse oximetry and re-evaluation of patient's condition.        Dimitris Shanahan A, PA-C 01/27/21 1912    Rozelle Logan, DO 02/02/21 1408

## 2021-01-27 NOTE — ED Provider Notes (Signed)
[facility] EMERGENCY DEPARTMENT Provider Note   CSN: [id number] Arrival date & time: 01/27/21  1002     History Chief Complaint  Patient presents with   Shortness of Breath   Weakness    Mark Garrett is a 40 y.o. male presenting to the ED via EMS with chief complaint of 3-day history of shortness of breath and chest pain.  Patient has history of asthma, COPD, CABG 2006.  Patient describes this chest pain as a deep ache that radiates to his left shoulder.  Notes some mild numbness and tingling of the left arm.  Patient states that he has a history of DVT and PE with the most recent episode 1 month ago at [facility].  He was taking his Xarelto until his prescription ran out a few days ago.  He has productive cough with unspecified sputum color.  He has been having diarrhea for 3 days and reports having "10-20 episodes a day". Denies fevers, sore throat, abdominal pain, nausea, vomiting.  Patient reportedly too weak to get up last night and urinate.  No alleviating factors.   Shortness of Breath Associated symptoms: chest pain and cough   Associated symptoms: no abdominal pain, no fever, no headaches, no rash and no vomiting   Weakness Associated symptoms: chest pain, cough, diarrhea and shortness of breath   Associated symptoms: no abdominal pain, no fever, no headaches, no nausea and no vomiting       No past medical history on file.  There are no problems to display for this patient.    No family history on file.     Home Medications Prior to Admission medications   Not on File    Allergies    Iodide  Review of Systems   Review of Systems  Constitutional:  Negative for fever.  HENT: Negative.    Eyes: Negative.   Respiratory:  Positive for cough and shortness of breath.   Cardiovascular:  Positive for chest pain and leg swelling.  Gastrointestinal:  Positive for diarrhea. Negative for abdominal pain, nausea and vomiting.  Endocrine: Negative.    Genitourinary: Negative.   Musculoskeletal: Negative.   Skin:  Negative for rash.  Neurological:  Positive for numbness. Negative for headaches.  All other systems reviewed and are negative.  Physical Exam Updated Vital Signs BP (!) 123/96 (BP Location: Left Arm)   Pulse (!) 106   Temp 98.3 F (36.8 C) (Oral)   Resp (!) 24   Ht 5\' 11"  (1.803 m)   Wt 72.6 kg   SpO2 95%   BMI 22.32 kg/m   Physical Exam Vitals and nursing note reviewed.  Constitutional:      General: He is not in acute distress.    Appearance: He is ill-appearing.     Comments: Patient is lying down in bed appearing uncomfortable with increased work of breathing.  HENT:     Head: Atraumatic.  Eyes:     Conjunctiva/sclera: Conjunctivae normal.  Cardiovascular:     Rate and Rhythm: Regular rhythm. Tachycardia present.     Pulses: Normal pulses.     Heart sounds: No murmur heard.    Comments: 2+ distal pulses symmetric bilaterally.  2+ pitting edema in the bilateral lower extremities.  No signs of ischemia Pulmonary:     Effort: Accessory muscle usage and prolonged expiration present. No respiratory distress.     Breath sounds: Examination of the right-upper field reveals wheezing. Examination of the left-upper field reveals wheezing. Examination of  the right-middle field reveals wheezing. Examination of the left-middle field reveals wheezing. Examination of the right-lower field reveals wheezing. Examination of the left-lower field reveals wheezing. Wheezing present.     Comments: Diffuse wheezing throughout all lung fields on expiration.  Chest:     Chest wall: No tenderness.  Abdominal:     General: Abdomen is flat. There is no distension.     Palpations: Abdomen is soft.     Tenderness: There is no abdominal tenderness.  Musculoskeletal:        General: Normal range of motion.     Cervical back: Normal range of motion.     Right lower leg: Edema present.     Left lower leg: Edema present.  Skin:     General: Skin is warm and dry.     Capillary Refill: Capillary refill takes less than 2 seconds.  Neurological:     General: No focal deficit present.     Mental Status: He is alert.  Psychiatric:        Mood and Affect: Mood normal.    ED Results / Procedures / Treatments   Labs (all labs ordered are listed, but only abnormal results are displayed) Labs Reviewed  COMPREHENSIVE METABOLIC PANEL  CBC  URINALYSIS, ROUTINE W REFLEX MICROSCOPIC  BRAIN NATRIURETIC PEPTIDE  TROPONIN I (HIGH SENSITIVITY)    EKG EKG Interpretation  Date/Time:  Thursday January 27 2021 10:24:12 EST Ventricular Rate:  102 PR Interval:  158 QRS Duration: 116 QT Interval:  366 QTC Calculation: 477 R Axis:   129 Text Interpretation: Sinus tachycardia Ventricular premature complex Incomplete right bundle branch block Inferior infarct, age indeterminate No previous Confirmed by Lavenia Atlas [phone]) on 01/27/2021 10:26:36 AM  Radiology No results found.  Procedures Procedures   Medications Ordered in ED Medications  ipratropium-albuterol (DUONEB) 0.5-2.5 (3) MG/3ML nebulizer solution 3 mL (has no administration in time range)  methylPREDNISolone sodium succinate (SOLU-MEDROL) 125 mg/2 mL injection 125 mg (has no administration in time range)    ED Course  I have reviewed the triage vital signs and the nursing notes.  Pertinent labs & imaging results that were available during my care of the patient were reviewed by me and considered in my medical decision making (see chart for details).    MDM Rules/Calculators/A&P                         40 year old male presenting today for a chief complaint of chest pain and shortness of breath ongoing for about 3 days.  On physical exam patient has increased work of breathing and diffuse wheezing throughout all lung fields.  95% on room air SPO2.  Given 125 mg IV Solu-Medrol in addition to like DuoNeb treatment. An EKG, patient had deep S waves in 1 and Q  waves in 3.  Coupled with physical symptoms and recent history of DVT/PE and lack of anticoagulation for the last few days, will proceed with CTA evaluation of PE in addition to trending troponins.  Given patient's overall clinical appearance including his lower extremity edema, shortness of breath, considering COPD exacerbation versus PE versus new onset CHF or possibly pneumonia.    Labs ordered in ED: CMP: BUN of 23 and Creat of 1.39. Albumin decreased to 2.7, likely due to patient's edema CBC: White count significantly elevated at 12.6, microcytic anemia UA: Unremarkable BNP: Elevated at 1733 Troponin: Trending upwards 90-1 08.   Lactic acid 3.7   Imaging ordered in  ED: CTA chest: Chest xray: Cardiomegaly.  Small right pleural effusion.  Pulmonary nodules seen on previous CTs  CT was unable to be completed immediately d/t known allergy of contrast dye. Radiology called at 1330 to have [place] give him an additional 75mg  Solu-medrol and 50mg  Bendaryl in order to complete CTA at 1515.  Ordered bilateral venous ultrasounds of the lower extremities in the meantime which revealed DVT in the right popliteal and posterior tibial vein as well as DVT in the left popliteal vein.  Even without CTA results currently available, suspect patient is having massive PE. Chest xray shows cardiomegaly suggestive of new onset CHF.  BNP is significantly elevated.  And his troponins are trending upwards.  Lactic acid 3.7 and elevated white blood cell count of 12.6, believe patient is at risk for cardiogenic shock.  Britni Henderly, PA-C talked with pharmacy about risks of heparinizing while waiting for CTA and we were reassured that should they need to thrombolysis, they would simply need to discontinue the heparin for about 45 minutes to an hour.   Transfered care over to Dr. who will determine ultimate disposition and plan.   Final Clinical Impression(s) / ED Diagnoses Final diagnoses:  Deep  vein thrombosis (DVT) of popliteal vein of both lower extremities, unspecified chronicity [facility])    Rx / DC Orders ED Discharge Orders     None        Dwaine Gale, PA-C 01/27/21 1513    Janell Quiet, DO 02/02/21 1408

## 2021-01-28 ENCOUNTER — Other Ambulatory Visit (HOSPITAL_COMMUNITY): Payer: Self-pay

## 2021-01-28 ENCOUNTER — Inpatient Hospital Stay (HOSPITAL_COMMUNITY): Payer: Self-pay

## 2021-01-28 ENCOUNTER — Encounter (HOSPITAL_COMMUNITY): Payer: Self-pay | Admitting: Internal Medicine

## 2021-01-28 DIAGNOSIS — I5021 Acute systolic (congestive) heart failure: Secondary | ICD-10-CM | POA: Insufficient documentation

## 2021-01-28 DIAGNOSIS — I5023 Acute on chronic systolic (congestive) heart failure: Secondary | ICD-10-CM

## 2021-01-28 LAB — BASIC METABOLIC PANEL
Anion gap: 11 (ref 5–15)
BUN: 30 mg/dL — ABNORMAL HIGH (ref 6–20)
CO2: 27 mmol/L (ref 22–32)
Calcium: 8.2 mg/dL — ABNORMAL LOW (ref 8.9–10.3)
Chloride: 94 mmol/L — ABNORMAL LOW (ref 98–111)
Creatinine, Ser: 1.37 mg/dL — ABNORMAL HIGH (ref 0.61–1.24)
GFR, Estimated: >60 mL/min (ref >60)
Glucose, Bld: 141 mg/dL — ABNORMAL HIGH (ref 70–99)
Potassium: 3.7 mmol/L (ref 3.5–5.1)
Sodium: 132 mmol/L — ABNORMAL LOW (ref 135–145)

## 2021-01-28 LAB — LACTIC ACID, PLASMA: Lactic Acid, Venous: 3.1 mmol/L (ref 0.5–1.9)

## 2021-01-28 LAB — COMPREHENSIVE METABOLIC PANEL
ALT: 28 U/L (ref 0–44)
AST: 41 U/L (ref 15–41)
Albumin: 2.9 g/dL — ABNORMAL LOW (ref 3.5–5.0)
Alkaline Phosphatase: 95 U/L (ref 38–126)
Anion gap: 13 (ref 5–15)
BUN: 26 mg/dL — ABNORMAL HIGH (ref 6–20)
CO2: 26 mmol/L (ref 22–32)
Calcium: 8.3 mg/dL — ABNORMAL LOW (ref 8.9–10.3)
Chloride: 96 mmol/L — ABNORMAL LOW (ref 98–111)
Creatinine, Ser: 1.3 mg/dL — ABNORMAL HIGH (ref 0.61–1.24)
GFR, Estimated: >60 mL/min (ref >60)
Glucose, Bld: 150 mg/dL — ABNORMAL HIGH (ref 70–99)
Potassium: 3.2 mmol/L — ABNORMAL LOW (ref 3.5–5.1)
Sodium: 135 mmol/L (ref 135–145)
Total Bilirubin: 6.6 mg/dL — ABNORMAL HIGH (ref 0.3–1.2)
Total Protein: 5.8 g/dL — ABNORMAL LOW (ref 6.5–8.1)

## 2021-01-28 LAB — CBC
HCT: 38.7 % — ABNORMAL LOW (ref 39.0–52.0)
Hemoglobin: 10.9 g/dL — ABNORMAL LOW (ref 13.0–17.0)
MCH: 19.1 pg — ABNORMAL LOW (ref 26.0–34.0)
MCHC: 28.2 g/dL — ABNORMAL LOW (ref 30.0–36.0)
MCV: 67.7 fL — ABNORMAL LOW (ref 80.0–100.0)
Platelets: 182 10*3/uL (ref 150–400)
RBC: 5.72 MIL/uL (ref 4.22–5.81)
RDW: 23.4 % — ABNORMAL HIGH (ref 11.5–15.5)
WBC: 5.6 10*3/uL (ref 4.0–10.5)
nRBC: 0 % (ref 0.0–0.2)

## 2021-01-28 LAB — ECHOCARDIOGRAM COMPLETE
AR max vel: 2.56 cm2
AV Peak grad: 5 mmHg
Ao pk vel: 1.12 m/s
Area-P 1/2: 6.27 cm2
Calc EF: 41.8 %
Height: 71 in
MV M vel: 4.79 m/s
MV Peak grad: 91.9 mmHg
MV VTI: 1.06 cm2
P 1/2 time: 536 msec
S' Lateral: 5.1 cm
Single Plane A2C EF: 44.7 %
Single Plane A4C EF: 42 %
Weight: 2867.74 oz

## 2021-01-28 LAB — C DIFFICILE QUICK SCREEN W PCR REFLEX
C Diff antigen: NEGATIVE
C Diff interpretation: NOT DETECTED
C Diff toxin: NEGATIVE

## 2021-01-28 LAB — IRON AND TIBC
Iron: 25 ug/dL — ABNORMAL LOW (ref 45–182)
Saturation Ratios: 6 % — ABNORMAL LOW (ref 17.9–39.5)
TIBC: 400 ug/dL (ref 250–450)
UIBC: 375 ug/dL

## 2021-01-28 LAB — TROPONIN I (HIGH SENSITIVITY): Troponin I (High Sensitivity): 91 ng/L — ABNORMAL HIGH (ref <18)

## 2021-01-28 LAB — HIV ANTIBODY (ROUTINE TESTING W REFLEX): HIV Screen 4th Generation wRfx: NONREACTIVE

## 2021-01-28 LAB — HEPARIN LEVEL (UNFRACTIONATED)
Heparin Unfractionated: 0.29 IU/mL — ABNORMAL LOW (ref 0.30–0.70)
Heparin Unfractionated: 0.25 IU/mL — ABNORMAL LOW (ref 0.30–0.70)

## 2021-01-28 LAB — FERRITIN: Ferritin: 121 ng/mL (ref 24–336)

## 2021-01-28 LAB — BILIRUBIN, DIRECT: Bilirubin, Direct: 3.1 mg/dL — ABNORMAL HIGH (ref 0.0–0.2)

## 2021-01-28 LAB — PROCALCITONIN: Procalcitonin: 0.5 ng/mL

## 2021-01-28 MED ORDER — AMOXICILLIN-POT CLAVULANATE 875-125 MG PO TABS
1.0000 | ORAL_TABLET | Freq: Two times a day (BID) | ORAL | Status: AC
  Administered 2021-01-28 – 2021-02-01 (×10): 1 via ORAL
  Filled 2021-01-28 (×10): qty 1

## 2021-01-28 MED ORDER — APIXABAN 5 MG PO TABS
10.0000 mg | ORAL_TABLET | Freq: Two times a day (BID) | ORAL | Status: DC
  Administered 2021-01-28 – 2021-01-29 (×3): 10 mg via ORAL
  Filled 2021-01-28 (×3): qty 2

## 2021-01-28 MED ORDER — ADULT MULTIVITAMIN W/MINERALS CH
1.0000 | ORAL_TABLET | Freq: Every day | ORAL | Status: DC
  Administered 2021-01-28 – 2021-02-16 (×20): 1 via ORAL
  Filled 2021-01-28 (×20): qty 1

## 2021-01-28 MED ORDER — APIXABAN 5 MG PO TABS: 5.0000 mg | ORAL_TABLET | Freq: Two times a day (BID) | ORAL | Status: DC

## 2021-01-28 MED ORDER — POTASSIUM CHLORIDE CRYS ER 20 MEQ PO TBCR
40.0000 meq | EXTENDED_RELEASE_TABLET | Freq: Once | ORAL | Status: AC
  Administered 2021-01-28: 40 meq via ORAL
  Filled 2021-01-28: qty 2

## 2021-01-28 MED ORDER — NICOTINE 21 MG/24HR TD PT24
21.0000 mg | MEDICATED_PATCH | Freq: Every day | TRANSDERMAL | Status: DC
  Administered 2021-01-28 – 2021-02-16 (×20): 21 mg via TRANSDERMAL
  Filled 2021-01-28 (×20): qty 1

## 2021-01-28 MED ORDER — ENSURE ENLIVE PO LIQD
237.0000 mL | Freq: Two times a day (BID) | ORAL | Status: DC
  Administered 2021-01-28 – 2021-02-01 (×8): 237 mL via ORAL

## 2021-01-28 NOTE — Progress Notes (Addendum)
PROGRESS NOTE    Mark Garrett  FTD:[id number] DOB: [birth year] DOA: 01/27/2021 PCP: Pcp, No    Brief Narrative:  Mark Garrett was admitted to the hospital with the working diagnosis of acute hypoxemic respiratory failure due acute systolic heart failure exacerbation.   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  Reported 3 days worsening lower extremity edema, not able to use his prosthetics on his right lower extremity, then noticed dyspnea and PND.  Also reported 3 days of diarrhea. On his initial physical examination his oximetry was 87% on room air, his blood pressure was 114/91, heart rate 91, temperature 98, respiratory 20.  Heart S1-S2, present, tachycardic, his lungs had rales at the lower lobe, positive scattered wheezing, abdomen soft, no lower extremity edema.  Sodium 135, potassium 3.2, chloride 96, bicarb 26, glucose 150, BUN 26, creatinine 1.30, bilirubin 6.6, lactic acid 3.1, white count 5.6, hemoglobin 10.9, hematocrit 38.7, platelets 182.  SARS COVID-19 negative, influenza A/B negative.  Urinalysis specific gravity 1.006, negative nitrates.  C. difficile was negative.  Chest radiograph with cardiomegaly, right lower lobe opacity.  CT chest negative for pulmonary embolism. Right middle lobe and right lower lobe nodular opacities, groundglass opacities, positive pleural effusion.  EKG 102 bpm, normal axis, normal intervals, sinus rhythm, positive PVCs, poor R wave progression.  Lower extremity ultrasonography, age indeterminate deep vein thrombosis involving the right popliteal vein and right posterior tibial vein. Age indeterminate deep vein thrombosis above the left popliteal vein.  Assessment & Plan:   Principal Problem:   Acute on chronic systolic CHF (congestive heart failure) (HCC) Active Problems:   CAP (community  acquired pneumonia)   Acute respiratory failure with hypoxia (HCC)   Pleural effusion on right   Microcytic anemia   Hypoalbuminemia   Elevated brain natriuretic peptide (BNP) level   Elevated troponin   Lactic acidosis   DVT (deep venous thrombosis) (HCC)   Tobacco abuse   COPD (chronic obstructive pulmonary disease) (HCC)   Asthma   Acute on chronic systolic heart failure exacerbation, complicated with acute hypoxemic respiratory failure, right pleural effusion.  Patient has been placed on diuresis with improvement of his symptoms but not back to baseline.  Follow up on echocardiogram report, but preliminary looks decreased LV EF and mitral regurgitation.  His blood pressure is low down to 90 to 96 mmHg systolic.  His oxygenation 99% room air.   Elevated bilirubin due to congested hepatopathy.   Plan to continue diuresis with furosemide to keep a negative fluid balance.  Add cardiac monitoring Close follow up on blood pressure and hemodynamics. Pending final report from echo before starting guideline directed therapy for heart failure.   2. Community acquired pneumonia. Patient has been afebrile, and no leukocytosis. Procalcintonin is 0,50.  Will continue antibiotic therapy with plan to complete 5 days of Augmentin.  Continue as needed bronchodilator therapy.   3. AKI,  hyopkalemia. Renal function with serum cr at 1,30, K is 3,2 and serum bicarbonate at 26. Continue diuresis with furosemide.  Plan to add Kcl 40 meq x1.   4. Bilateral lower extremities, DVT. Will continue anticoagulation with apixaban, he run out rivaroxaban 30 day free trial.   5. Chronic anemia. Close follow up on cell count.,  Consult nutrition.   6. Tobacco abuse. Smoking cessation   Patient continue to be at high risk for worsening heart failure and hypotension  Status is: Inpatient  Remains inpatient appropriate because: IV diuresis.    DVT prophylaxis: Rivaroxaban   Code Status:    full   Family Communication:   No family at the bedside     Antimicrobials:  Augmentin     Subjective: Patient is feeling better, but not yet back to his baseline, continue to have dyspnea and lower extremity edema, no fever or chills at home, no significant cough.   Objective: Vitals:   01/27/21 2133 01/28/21 0040 01/28/21 0523 01/28/21 0736  BP: 108/85 118/76 (!) 96/59 90/68  Pulse: 89 94 61 60  Resp: 17 17 17 18   Temp: (!) 97.5 F (36.4 C) 97.8 F (36.6 C) 98 F (36.7 C) (!) 97.4 F (36.3 C)  TempSrc: Oral   Oral  SpO2: 95% 96%  99%  Weight:   81.3 kg   Height:        Intake/Output Summary (Last 24 hours) at 01/28/2021 1119 Last data filed at 01/28/2021 [id number] Gross per 24 hour  Intake 783.08 ml  Output 4550 ml  Net -3766.92 ml   Filed Weights   01/27/21 1008 01/28/21 0523  Weight: 72.6 kg 81.3 kg    Examination:   General: Not in pain or dyspnea. Deconditioned  Neurology: Awake and alert, non focal  E ENT: mild pallor, no icterus, oral mucosa moist Cardiovascular: Mild to moderate JVD. S1-S2 present, rhythmic, no gallops, rubs, positive systolic murmur at the apex. +++ pitting bilateral lower extremity edema. Pulmonary: positive breath sounds bilaterally, positive bilateral expiatory wheezing, but  no rhonchi, positive bilateral rales. Gastrointestinal. Abdomen soft and non tender Skin. No rashes Musculoskeletal: no joint deformities     Data Reviewed: I have personally reviewed following labs and imaging studies  CBC: Recent Labs  Lab 01/27/21 1032 01/27/21 2259  WBC 12.6* 5.6  HGB 10.1* 10.9*  HCT 36.9* 38.7*  MCV 70.3* 67.7*  PLT 195 [id number]   Basic Metabolic Panel: Recent Labs  Lab 01/27/21 1032 01/27/21 2259  NA 133* 135  K 3.5 3.2*  CL 99 96*  CO2 22 26  GLUCOSE 119* 150*  BUN 23* 26*  CREATININE 1.39* 1.30*  CALCIUM 8.2* 8.3*   GFR: Estimated Creatinine Clearance: 78.8 mL/min (A) (by C-G formula based on SCr of 1.3 mg/dL (H)). Liver  Function Tests: Recent Labs  Lab 01/27/21 1032 01/27/21 2259  AST 35 41  ALT 28 28  ALKPHOS 101 95  BILITOT 6.3* 6.6*  PROT 5.5* 5.8*  ALBUMIN 2.7* 2.9*   No results for input(s): LIPASE, AMYLASE in the last 168 hours. No results for input(s): AMMONIA in the last 168 hours. Coagulation Profile: Recent Labs  Lab 01/27/21 1536  INR 2.5*   Cardiac Enzymes: No results for input(s): CKTOTAL, CKMB, CKMBINDEX, TROPONINI in the last 168 hours. BNP (last 3 results) No results for input(s): PROBNP in the last 8760 hours. HbA1C: No results for input(s): HGBA1C in the last 72 hours. CBG: No results for input(s): GLUCAP in the last 168 hours. Lipid Profile: No results for input(s): CHOL, HDL, LDLCALC, TRIG, CHOLHDL, LDLDIRECT in the last 72 hours. Thyroid Function Tests: No results for input(s): TSH, T4TOTAL, FREET4, T3FREE, THYROIDAB in the last 72 hours. Anemia Panel: Recent Labs    01/27/21 2259  FERRITIN 121  TIBC 400  IRON 25*      Radiology Studies: I have reviewed all of the imaging during this hospital visit personally     Scheduled Meds:  dextromethorphan-guaiFENesin  1 tablet Oral BID  feeding supplement  1 Container Oral TID BM   furosemide  40 mg Intravenous Q12H   methylPREDNISolone (SOLU-MEDROL) injection  40 mg Intravenous Q12H   pantoprazole  40 mg Oral Daily   Continuous Infusions:  azithromycin Stopped (01/27/21 1934)   cefTRIAXone (ROCEPHIN)  IV     heparin 1,350 Units/hr (01/28/21 0750)     LOS: 1 day        Emmalynn Pinkham Gerome Apley, MD

## 2021-01-28 NOTE — TOC Initial Note (Addendum)
Transition of Care [facility]) - Initial/Assessment Note    Patient Details  Name: UZZIEL RUSSEY MRN: [id number] Date of Birth: 01/04/1981  [facility]) CM/SW Contact:    Kingsley Plan, RN Phone Number: 01/28/2021, 10:46 AM  Clinical Narrative:                 Patient from home alone. His Aunt lives 15 miles away.   He does not have a PCP. He has moved to [place] and has not gone to establish care at the [facility] clinic. Patient states [facility] gave him 30 days free supply of Xarelto. NCM asked if they discussed patient assistance program and following up at the clinic post discharge. Patient stated "I don't know".   Most likely patient has used 30 day free card for Xarelto, if needed at discharge can ask [employer] [facility] [employer] if they can fill one time.   If needs Eliquis at discharge can use 30 day free card.   NCM provided patient with assistance program applications for Xarelto and Eliquis   Discussed [facility] located [address] in [place]. [facility] requires patient to call himself to schedule an appointment. Patient states he has no phone. NCM suggested he call while here in the hospital, patient reapplied " I don't know".   NCM offered to schedule an appointment at a [facility] in [place] until he arranges an appointment at [facility]. NCM discussed [facility] for appointment also . Patient stated he does not have a phone to schedule transportation. His aunt lives 15 miles away. However, he does have neighbors who would let him use their phone. Delsa Bern [phone].    NCM provided [employer]. Patient states he can  read waiver. Once signed NCM will email waiver and then he can use Pam and David's phone to arrnge transportation to [facility] appointments. Will place [employer] information on AVS.    NCM provided information on applying for Medicaid. NCM called [employer] spoke to  [place]. They are aware patient does not have a phone and will call him on hospital room phone.   Scheduled follow up appointment placed on AVS.   Attending aware of above.   1315 Provided 30 day free Eliquis card and explained. Asked patient if he had read transportation waiver and signed. He has not.   1415 Asked MD if scripts can be sent to [facility] today   Expected Discharge Plan: Home/Self Care     Patient Goals and CMS Choice Patient states their goals for this hospitalization and ongoing recovery are:: to return to home CMS Medicare.gov Compare Post Acute Care list provided to:: Patient    Expected Discharge Plan and Services Expected Discharge Plan: Home/Self Care   Discharge Planning Services: CM Consult, Indigent Health Clinic, [facility] Program, Medication Assistance, Follow-up appt scheduled   Living arrangements for the past 2 months: Single Family Home                   DME Agency: NA       HH Arranged: NA          Prior Living Arrangements/Services Living arrangements for the past 2 months: Single Family Home Lives with:: Self Patient language and need for interpreter reviewed:: Yes        Need for Family Participation in Patient Care: Yes (Comment) Care giver support system in place?: Yes (comment)   Criminal Activity/Legal Involvement Pertinent  to Current Situation/Hospitalization: No - Comment as needed  Activities of Daily Living Home Assistive Devices/Equipment: Prosthesis ADL Screening (condition at time of admission) Patient's cognitive ability adequate to safely complete daily activities?: Yes Is the patient deaf or have difficulty hearing?: No Does the patient have difficulty seeing, even when wearing glasses/contacts?: No Does the patient have difficulty concentrating, remembering, or making decisions?: No Patient able to express need for assistance with ADLs?: Yes Does the patient have difficulty dressing or bathing?: No Independently  performs ADLs?: Yes (appropriate for developmental age) Does the patient have difficulty walking or climbing stairs?: No Weakness of Legs: Left Weakness of Arms/Hands: None  Permission Sought/Granted   Permission granted to share information with : No              Emotional Assessment Appearance:: Appears stated age Attitude/Demeanor/Rapport: Avoidant Affect (typically observed): Flat Orientation: : Oriented to Self, Oriented to Place, Oriented to  Time, Oriented to Situation Alcohol / Substance Use: Not Applicable Psych Involvement: No (comment)  Admission diagnosis:  CAP (community acquired pneumonia) [J18.9] Deep vein thrombosis (DVT) of popliteal vein of both lower extremities, unspecified chronicity (HCC) [I82.433] Patient Active Problem List   Diagnosis Date Noted   CAP (community acquired pneumonia) 01/27/2021   Acute respiratory failure with hypoxia (HCC) 01/27/2021   Pleural effusion on right 01/27/2021   Leukocytosis 01/27/2021   Microcytic anemia 01/27/2021   Hypoalbuminemia 01/27/2021   Elevated brain natriuretic peptide (BNP) level 01/27/2021   Elevated troponin 01/27/2021   Lactic acidosis 01/27/2021   DVT (deep venous thrombosis) (HCC) 01/27/2021   Diarrhea 01/27/2021   Tobacco abuse 01/27/2021   COPD (chronic obstructive pulmonary disease) (HCC) 01/27/2021   Asthma 01/27/2021   PCP:  Oneita Hurt, No Pharmacy:   [facility] [phone] - [place], [place] - [address] [place] [address] Phone: [phone] Fax: [phone]  [name] [address] [place] [address] Phone: [phone] Fax: [phone]     Social Determinants of Health (SDOH) Interventions    Readmission Risk Interventions No flowsheet data found.

## 2021-01-28 NOTE — Progress Notes (Signed)
Initial Nutrition Assessment  DOCUMENTATION CODES:   Not applicable  INTERVENTION:  -d/c Boost Breeze -Ensure Enlive po BID, each supplement provides 350 kcal and 20 grams of protein -MVI with minerals daily  NUTRITION DIAGNOSIS:   Increased nutrient needs related to chronic illness (CHF exacerbation) as evidenced by estimated needs.  GOAL:   Patient will meet greater than or equal to 90% of their needs  MONITOR:   PO intake, Supplement acceptance, Labs, Weight trends, I & O's  REASON FOR ASSESSMENT:   Consult Assessment of nutrition requirement/status  ASSESSMENT:   Pt with PMH significant for asthma, COPD, tobacco abuse, s/p aortic revascularization, R BKA and DVT admitted with acute hypoxemic respiratory failure 2/2 acute systolic CHF exacerbation.  Pt unavailable at time of RD visit.   Pt with orders for Boost Breeze but given increased nutrient needs 2/2 CHF exacerbation will d/c Boost and order Ensure Enlive as it provides more kcals/protein.   No weight history available for review.   No PO Intake documented   UOP: x24 hours I/O: - since admit  Medications:  amoxicillin-clavulanate  1 tablet Oral Q12H   dextromethorphan-guaiFENesin  1 tablet Oral BID   feeding supplement  237 mL Oral BID BM   furosemide  40 mg Intravenous Q12H   multivitamin with minerals  1 tablet Oral Daily   pantoprazole  40 mg Oral Daily   potassium chloride  40 mEq Oral Once   Labs: Recent Labs  Lab 01/27/21 1032 01/27/21 2259  NA 133* 135  K 3.5 3.2*  CL 99 96*  CO2 22 26  BUN 23* 26*  CREATININE 1.39* 1.30*  CALCIUM 8.2* 8.3*  GLUCOSE 119* 150*   NUTRITION - FOCUSED PHYSICAL EXAM: Unable to perform at this time. Will attempt at follow-up.  Diet Order:   Diet Order             Diet Heart Room service appropriate? Yes; Fluid consistency: Thin  Diet effective now                   EDUCATION NEEDS:   No education needs have been identified at  this time  Skin:  Skin Assessment: Reviewed RN Assessment  Last BM:  11/10  Height:   Ht Readings from Last 1 Encounters:  01/27/21 5\' 11"  (1.803 m)    Weight:   Wt Readings from Last 1 Encounters:  01/28/21 81.3 kg     BMI:  Body mass index is 25 kg/m.  Estimated Nutritional Needs:   Kcal:  2000-2200  Protein:  100-110 grams  Fluid:  >/=2L     13/11/22., MS, RD, LDN (she/her/hers) RD pager number and weekend/on-call pager number located in [facility].

## 2021-01-28 NOTE — Progress Notes (Signed)
Mobility Specialist Progress Note:   01/28/21 1450  Mobility  Activity Ambulated in hall  Level of Assistance Independent  Assistive Device None  Distance Ambulated (ft) 180 ft  Mobility Ambulated independently in hallway  Mobility Response Tolerated well  Mobility performed by Mobility specialist  $Mobility charge 1 Mobility   Pt ambulated with no AD and no prosthetic. Asx during amb. Distance limited by not having prosthetic. Left sitting EOB eating lunch.  Addison Lank Mobility Specialist  Phone [phone]

## 2021-01-28 NOTE — TOC Benefit Eligibility Note (Signed)
Patient Advocate Encounter  Insurance verification completed.    The patient is uninsured  Trany Chernick, CPhT Pharmacy Patient Advocate Specialist [facility] Patient Advocate Team Direct Number: [phone]  Fax: [phone]        

## 2021-01-28 NOTE — Progress Notes (Signed)
Patient had 3bts of WQRS at 2046. Upon entering the room to assess he was sitting on the toilet smocking. He was educated. On-call notified, nicotine patch ordered. We continue to monitor.

## 2021-01-28 NOTE — Progress Notes (Addendum)
ANTICOAGULATION CONSULT NOTE  Pharmacy Consult for hEPARIN Indication: pre-existing DVT  Allergies  Allergen Reactions   Iodide Rash    Patient Measurements: Height: 5\' 11"  (180.3 cm) Weight: 81.3 kg (179 lb 3.7 oz) IBW/kg (Calculated) : 75.3  Heparin Dosing Weight: 72.6 kg  Vital Signs: Temp: 97.4 F (36.3 C) (11/11 0736) Temp Source: Oral (11/11 0736) BP: 90/68 (11/11 0736) Pulse Rate: 60 (11/11 0736)  Labs: Recent Labs    01/27/21 1032 01/27/21 1245 01/27/21 1536 01/27/21 1742 01/27/21 2259 01/28/21 1004  HGB 10.1*  --   --   --  10.9*  --   HCT 36.9*  --   --   --  38.7*  --   PLT 195  --   --   --  182  --   LABPROT  --   --  26.7*  --   --   --   INR  --   --  2.5*  --   --   --   HEPARINUNFRC  --   --   --   --  0.29* 0.25*  CREATININE 1.39*  --   --   --  1.30*  --   TROPONINIHS 99* 108*  --  105* 91*  --     Estimated Creatinine Clearance: 78.8 mL/min (A) (by C-G formula based on SCr of 1.3 mg/dL (H)).   Medications:  Medications Prior to Admission  Medication Sig Dispense Refill Last Dose   XARELTO 20 MG TABS tablet Take 20 mg by mouth every evening.   01/25/2021 at 1900   Infusions:   azithromycin Stopped (01/27/21 1934)   cefTRIAXone (ROCEPHIN)  IV     heparin 1,350 Units/hr (01/28/21 0750)    Assessment: 40 y.o. male with medical history significant for asthma/COPD, recent DVT, tobacco abuse who presented with 3 days SOB and CP. CT showed no evidence of pulmonary embolism.. 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 Pharmacy consulted to initiate heparin infusion.   Patient was previously prescribed Xarelto to treat DVT but has been out of the medication. APTT and Heparin levels correlating and support patient was not on AC PTA.   Heparin level still low at 0.25. Will increase rate of heparin and recheck in 6 hours. CBC stable. No s/sx bleeding per chart review.   Goal of Therapy:  Heparin level 0.3-0.7  units/ml Monitor platelets by anticoagulation protocol: Yes   Plan:  Increase heparin infusion to 1500 units/hr Check heparin level in 6 hours and daily while on heparin Continue to monitor H&H and platelets Follow-up plan for PO AC    Thank you for allowing pharmacy to be a part of this patient's care.  45, PharmD Clinical Pharmacist

## 2021-01-28 NOTE — Progress Notes (Signed)
ANTICOAGULATION CONSULT NOTE  Pharmacy Consult for Apixaban Indication: pre-existing DVT  Allergies  Allergen Reactions   Iodide Rash    Patient Measurements: Height: 5\' 11"  (180.3 cm) Weight: 81.3 kg (179 lb 3.7 oz) IBW/kg (Calculated) : 75.3  Heparin Dosing Weight: 72.6 kg  Vital Signs: Temp: 97.4 F (36.3 C) (11/11 0736) Temp Source: Oral (11/11 0736) BP: 90/68 (11/11 0736) Pulse Rate: 60 (11/11 0736)  Labs: Recent Labs    01/27/21 1032 01/27/21 1245 01/27/21 1536 01/27/21 1742 01/27/21 2259 01/28/21 1004  HGB 10.1*  --   --   --  10.9*  --   HCT 36.9*  --   --   --  38.7*  --   PLT 195  --   --   --  182  --   LABPROT  --   --  26.7*  --   --   --   INR  --   --  2.5*  --   --   --   HEPARINUNFRC  --   --   --   --  0.29* 0.25*  CREATININE 1.39*  --   --   --  1.30*  --   TROPONINIHS 99* 108*  --  105* 91*  --      Estimated Creatinine Clearance: 78.8 mL/min (A) (by C-G formula based on SCr of 1.3 mg/dL (H)).   Medications:  Medications Prior to Admission  Medication Sig Dispense Refill Last Dose   XARELTO 20 MG TABS tablet Take 20 mg by mouth every evening.   01/25/2021 at 1900   Infusions:     Assessment: 40 y.o. male with medical history significant for asthma/COPD, recent DVT, tobacco abuse who presented with 3 days SOB and CP. CT showed no evidence of pulmonary embolism.. 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 Pharmacy consulted to initiate heparin infusion.   Patient was previously prescribed Xarelto to treat DVT but has been out of the medication. APTT and Heparin levels correlating and support patient was not on AC PTA.  CBC stable. No s/sx bleeding per chart review. Will start apixaban   Goal of Therapy:  Heparin level 0.3-0.7 units/ml Monitor platelets by anticoagulation protocol: Yes   Plan:  Stop heparin at 1400 Give first dose of apixaban at 1400 Continue to monitor H&H and  platelets    Thank you for allowing pharmacy to be a part of this patient's care.  45, PharmD Clinical Pharmacist

## 2021-01-28 NOTE — Progress Notes (Signed)
ANTICOAGULATION CONSULT NOTE - Follow Up Consult  Pharmacy Consult for heparin Indication: DVT  Allergies  Allergen Reactions   Iodide Rash    Patient Measurements: Height: 5\' 11"  (180.3 cm) Weight: 72.6 kg (160 lb) IBW/kg (Calculated) : 75.3 Heparin Dosing Weight: 72.6 kg  Vital Signs: Temp: 97.8 F (36.6 C) (11/11 0040) Temp Source: Oral (11/10 2133) BP: 118/76 (11/11 0040) Pulse Rate: 94 (11/11 0040)  Labs: Recent Labs    01/27/21 1032 01/27/21 1245 01/27/21 1536 01/27/21 1742 01/27/21 2259  HGB 10.1*  --   --   --  10.9*  HCT 36.9*  --   --   --  38.7*  PLT 195  --   --   --  182  LABPROT  --   --  26.7*  --   --   INR  --   --  2.5*  --   --   HEPARINUNFRC  --   --   --   --  0.29*  CREATININE 1.39*  --   --   --  1.30*  TROPONINIHS 99* 108*  --  105* 91*     Estimated Creatinine Clearance: 76 mL/min (A) (by C-G formula based on SCr of 1.3 mg/dL (H)).   Medical History: History reviewed. No pertinent past medical history.  Medications: see MAR  Assessment: 40 yo M with hx of DVT but was no longer taking treatment - coming in with SOB - found to have bilateral DVT on doppler exam. No AC PTA. CBC ok.  Heparin level of 0.29 is subtherapeutic on heparin 1250 units/hr. Per RN no issues with IV infusion or access.  Goal of Therapy:  Heparin level 0.3-0.7 units/ml Monitor platelets by anticoagulation protocol: Yes   Plan:  Increase heparin to 1350 units/hr  Check 6 hr heparin level  Monitor heparin level, CBC and s/s of bleeding daily  F/u plan for PO AC prior to d/c  45, PharmD, BCPS Clinical Pharmacist 01/28/2021 1:09 AM

## 2021-01-29 ENCOUNTER — Inpatient Hospital Stay: Payer: Self-pay

## 2021-01-29 ENCOUNTER — Encounter (HOSPITAL_COMMUNITY): Payer: Self-pay | Admitting: Internal Medicine

## 2021-01-29 ENCOUNTER — Inpatient Hospital Stay (HOSPITAL_COMMUNITY): Payer: Self-pay

## 2021-01-29 DIAGNOSIS — R570 Cardiogenic shock: Secondary | ICD-10-CM

## 2021-01-29 DIAGNOSIS — I340 Nonrheumatic mitral (valve) insufficiency: Secondary | ICD-10-CM

## 2021-01-29 DIAGNOSIS — Z951 Presence of aortocoronary bypass graft: Secondary | ICD-10-CM

## 2021-01-29 DIAGNOSIS — I82433 Acute embolism and thrombosis of popliteal vein, bilateral: Secondary | ICD-10-CM

## 2021-01-29 DIAGNOSIS — I4729 Other ventricular tachycardia: Secondary | ICD-10-CM

## 2021-01-29 DIAGNOSIS — R17 Unspecified jaundice: Secondary | ICD-10-CM

## 2021-01-29 DIAGNOSIS — I5021 Acute systolic (congestive) heart failure: Secondary | ICD-10-CM

## 2021-01-29 DIAGNOSIS — I248 Other forms of acute ischemic heart disease: Secondary | ICD-10-CM

## 2021-01-29 LAB — CBC
HCT: 35.5 % — ABNORMAL LOW (ref 39.0–52.0)
Hemoglobin: 10.2 g/dL — ABNORMAL LOW (ref 13.0–17.0)
MCH: 19.5 pg — ABNORMAL LOW (ref 26.0–34.0)
MCHC: 28.7 g/dL — ABNORMAL LOW (ref 30.0–36.0)
MCV: 67.7 fL — ABNORMAL LOW (ref 80.0–100.0)
Platelets: 167 10*3/uL (ref 150–400)
RBC: 5.24 MIL/uL (ref 4.22–5.81)
RDW: 23.1 % — ABNORMAL HIGH (ref 11.5–15.5)
WBC: 23.9 10*3/uL — ABNORMAL HIGH (ref 4.0–10.5)
nRBC: 0.2 % (ref 0.0–0.2)

## 2021-01-29 LAB — BASIC METABOLIC PANEL
Anion gap: 10 (ref 5–15)
BUN: 33 mg/dL — ABNORMAL HIGH (ref 6–20)
CO2: 32 mmol/L (ref 22–32)
Calcium: 8.3 mg/dL — ABNORMAL LOW (ref 8.9–10.3)
Chloride: 91 mmol/L — ABNORMAL LOW (ref 98–111)
Creatinine, Ser: 1.32 mg/dL — ABNORMAL HIGH (ref 0.61–1.24)
GFR, Estimated: >60 mL/min (ref >60)
Glucose, Bld: 172 mg/dL — ABNORMAL HIGH (ref 70–99)
Potassium: 3.1 mmol/L — ABNORMAL LOW (ref 3.5–5.1)
Sodium: 133 mmol/L — ABNORMAL LOW (ref 135–145)

## 2021-01-29 LAB — MAGNESIUM: Magnesium: 1.9 mg/dL (ref 1.7–2.4)

## 2021-01-29 MED ORDER — ASPIRIN 81 MG PO CHEW
81.0000 mg | CHEWABLE_TABLET | Freq: Every day | ORAL | Status: DC
  Administered 2021-01-29 – 2021-02-16 (×17): 81 mg via ORAL
  Filled 2021-01-29 (×19): qty 1

## 2021-01-29 MED ORDER — MAGNESIUM SULFATE 2 GM/50ML IV SOLN
2.0000 g | Freq: Once | INTRAVENOUS | Status: AC
  Administered 2021-01-29: 2 g via INTRAVENOUS
  Filled 2021-01-29: qty 50

## 2021-01-29 MED ORDER — CHLORHEXIDINE GLUCONATE CLOTH 2 % EX PADS
6.0000 | MEDICATED_PAD | Freq: Every day | CUTANEOUS | Status: DC
  Administered 2021-01-29 – 2021-01-30 (×2): 6 via TOPICAL

## 2021-01-29 MED ORDER — AMIODARONE HCL IN DEXTROSE 360-4.14 MG/200ML-% IV SOLN
60.0000 mg/h | INTRAVENOUS | Status: AC
  Administered 2021-01-29 (×2): 60 mg/h via INTRAVENOUS
  Filled 2021-01-29: qty 200

## 2021-01-29 MED ORDER — HEPARIN (PORCINE) 25000 UT/250ML-% IV SOLN
1400.0000 [IU]/h | INTRAVENOUS | Status: DC
  Filled 2021-01-29: qty 250

## 2021-01-29 MED ORDER — HEPARIN (PORCINE) 25000 UT/250ML-% IV SOLN
1450.0000 [IU]/h | INTRAVENOUS | Status: DC
  Administered 2021-01-29: 1450 [IU]/h via INTRAVENOUS
  Filled 2021-01-29 (×2): qty 250

## 2021-01-29 MED ORDER — POTASSIUM CHLORIDE CRYS ER 20 MEQ PO TBCR
40.0000 meq | EXTENDED_RELEASE_TABLET | Freq: Once | ORAL | Status: AC
  Administered 2021-01-29: 40 meq via ORAL
  Filled 2021-01-29: qty 2

## 2021-01-29 MED ORDER — AMIODARONE HCL IN DEXTROSE 360-4.14 MG/200ML-% IV SOLN
60.0000 mg/h | INTRAVENOUS | Status: DC
  Administered 2021-01-30: 30 mg/h via INTRAVENOUS
  Filled 2021-01-29 (×3): qty 200

## 2021-01-29 MED ORDER — POTASSIUM CHLORIDE 10 MEQ/100ML IV SOLN
10.0000 meq | INTRAVENOUS | Status: AC
  Administered 2021-01-29 (×4): 10 meq via INTRAVENOUS
  Filled 2021-01-29 (×4): qty 100

## 2021-01-29 MED ORDER — AMIODARONE LOAD VIA INFUSION
150.0000 mg | INTRAVENOUS | Status: AC
  Administered 2021-01-29: 150 mg via INTRAVENOUS
  Filled 2021-01-29: qty 83.34

## 2021-01-29 NOTE — Assessment & Plan Note (Addendum)
--   Troponins flat, history suggests recent ACS although echocardiogram does not show focal abnormalities.  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  Poor historian. -- Currently asymptomatic, pain-free, will consult cardiology for their further recommendations

## 2021-01-29 NOTE — Assessment & Plan Note (Signed)
--   No wheezing on exam, good air movement, appears stable, apparently not on bronchodilators -- Recommend smoking cessation

## 2021-01-29 NOTE — Assessment & Plan Note (Signed)
--   Etiology unclear microcytosis significant -- Iron supplementation, follow-up as an outpatient

## 2021-01-29 NOTE — Consult Note (Addendum)
CONSULTATION NOTE   Patient Name: Mark Garrett Date of Encounter: 01/29/2021 Cardiologist: None Electrophysiologist: None Advanced Heart Failure: None   Chief Complaint   Shortness of breath, weakness  Patient Profile   40 year old male with shortness of breath and weakness, history of asthma/COPD, now found to have acute systolic congestive heart failure.  HPI   Mark Garrett is a 40 y.o. male who is being seen today for the evaluation of congestive heart failure at the request of Dr. Irene Limbo. This is a pleasant 40 year old male with a history of prior cardiac surgery (I suspect CABG) which he reports having in 2006 in [place] - (one of his vessels was "100% blocked" and they took a vein from his leg), asthma/COPD, DVT and tobacco abuse who presented with several days of increasing shortness of breath and chest pain.  The chest pain was described as left-sided chest pain that radiated down the left arm.  He also noted that he has had several days of leg swelling and had run out of his Xarelto which she was previously taking for DVT.  He called EMS and was noted to be hypotensive on arrival and was given IV fluids.  He was hypoxic in the emergency department room air sats at 87%.  BNP was elevated at 1733.  Troponin was mildly elevated at 99 and 108.  Lactic acid was elevated 3.8.  Chest x-ray showed patchy opacities in the lung and probable small right pleural effusion.  An age-indeterminate DVT was noted.  CT angiography of the chest was negative for central PE and demonstrated moderate right pleural effusion, however I personally reviewed the study and it was notable for prior bypass surgery. Echo was performed and demonstrates a lower LVEF of 35 to 40% with moderate LVH.  There is a description of severe posteriorly directed mitral regurgitation, moderate tricuspid regurgitation, mild AI, severe LAE, moderate RAE, moderate RV systolic dysfunction and interventricular septal  flattening consistent with RV pressure and volume overload.  Of note, the patient had a prior echo which I believe was at [facility] indicating LVEF 55 to 60% with basal inferior and inferoseptal hypokinesis.  This was performed on 12/02/2020.  There is mild to moderate aortic regurgitation, severe mitral regurgitation and mild to moderate tricuspid regurgitation noted.  Also noted the patient has been having runs of nonsustained VT greater than 20 beats for which she says he is asymptomatic.  They have increased in frequency today.  Other abnormal labs include low potassium and magnesium (3.1 and 1.9), elevated troponins of 108, 105, and 91.  Venous lactate 3.1.  Leukocytosis at 23,900.  Procalcitonin 0.5.  PMHx   Past Medical History:  Diagnosis Date   COPD (chronic obstructive pulmonary disease) (HCC)    DVT (deep venous thrombosis) (HCC)     Past Surgical History:  Procedure Laterality Date   CORONARY ARTERY BYPASS GRAFT     HERNIA REPAIR      FAMHx   Family History  Problem Relation Age of Onset   CAD Mother    CAD Father     SOCHx    reports that he has been smoking cigarettes. He has never used smokeless tobacco. No history on file for alcohol use and drug use.  Outpatient Medications   No current facility-administered medications on file prior to encounter.   Current Outpatient Medications on File Prior to Encounter  Medication Sig Dispense Refill   XARELTO 20 MG TABS tablet Take 20 mg by mouth every  evening.      Inpatient Medications    Scheduled Meds:  amiodarone  150 mg Intravenous STAT   amoxicillin-clavulanate  1 tablet Oral Q12H   apixaban  10 mg Oral BID   Followed by   Melene Muller ON 02/04/2021] apixaban  5 mg Oral BID   dextromethorphan-guaiFENesin  1 tablet Oral BID   feeding supplement  237 mL Oral BID BM   furosemide  40 mg Intravenous Q12H   multivitamin with minerals  1 tablet Oral Daily   nicotine  21 mg Transdermal QHS   pantoprazole  40 mg  Oral Daily    Continuous Infusions:  amiodarone     Followed by   amiodarone     magnesium sulfate bolus IVPB 2 g (01/29/21 1545)   potassium chloride 10 mEq (01/29/21 1553)    PRN Meds:    ALLERGIES   Allergies  Allergen Reactions   Iodide Rash    ROS   Pertinent items noted in HPI and remainder of comprehensive ROS otherwise negative.  Vitals   Vitals:   01/29/21 0500 01/29/21 0531 01/29/21 0747 01/29/21 1552  BP:  112/78 113/78 121/85  Pulse:  96 92 96  Resp:  Temp:  (!) 97.5 F (36.4 C) 97.9 F (36.6 C) 97.9 F (36.6 C)  TempSrc:  Oral Oral   SpO2:  99% 99% 93%  Weight: 82.3 kg     Height:        Intake/Output Summary (Last 24 hours) at 01/29/2021 1610 Last data filed at 01/29/2021 1421 Gross per 24 hour  Intake 820 ml  Output 4025 ml  Net -3205 ml   Filed Weights   01/27/21 1008 01/28/21 0523 01/29/21 0500  Weight: 72.6 kg 81.3 kg 82.3 kg    Physical Exam   General appearance: alert, fatigued, and no distress Neck: no carotid bruit, no JVD, and thyroid not enlarged, symmetric, no tenderness/mass/nodules Lungs: diminished breath sounds RLL and RML Heart: regular rate and rhythm, S1, S2 normal, and systolic murmur: holosystolic 3/6, blowing at apex Abdomen: soft, non-tender; bowel sounds normal; no masses,  no organomegaly Extremities: 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  VALUE IS CONSISTENT WITH PREVIOUSLY REPORTED AND CALLED VALUE. Performed at [facility]  Lab, [address], [place], [place] [address]   Troponin I (High Sensitivity)     Status: Abnormal   Collection Time: 01/27/21  5:42 PM  Result Value Ref Range   Troponin I (High Sensitivity) 105 (HH) <18 ng/L    Comment: CRITICAL VALUE NOTED.  VALUE IS CONSISTENT WITH PREVIOUSLY REPORTED AND CALLED VALUE. (NOTE) Elevated high sensitivity troponin I (hsTnI) values and significant  changes across serial measurements may suggest ACS but many other  chronic and acute conditions are known to elevate hsTnI results.  Refer to the Links section for chest pain algorithms and additional  guidance. Performed at [facility], [address] [place], [place] [address]   C Difficile Quick Screen  w PCR reflex     Status: None   Collection Time: 01/27/21  8:47 PM   Specimen: STOOL  Result Value Ref Range   C Diff antigen NEGATIVE NEGATIVE   C Diff toxin NEGATIVE NEGATIVE   C Diff interpretation No C. difficile detected.     Comment: Performed at [facility], [address] [place], [place] [address]  Heparin level (unfractionated)     Status: Abnormal   Collection Time: 01/27/21 10:59 PM  Result Value Ref Range   Heparin Unfractionated 0.29 (L) 0.30 - 0.70 IU/mL    Comment: (NOTE) The clinical reportable range upper limit is being lowered to >1.10 to align with the FDA approved guidance for the current laboratory assay.  If heparin results are below expected values, and patient dosage has  been confirmed, suggest follow up testing of antithrombin III levels. Performed at [facility], [address], [place], [place] [address]   CBC     Status: Abnormal   Collection Time: 01/27/21 10:59 PM  Result Value Ref Range   WBC 5.6 4.0 - 10.5 K/uL   RBC 5.72 4.22 - 5.81 MIL/uL   Hemoglobin 10.9 (L) 13.0 - 17.0 g/dL   HCT 09.8 (L) 11.9 - 14.7 %   MCV 67.7 (L) 80.0 - 100.0 fL   MCH 19.1 (L) 26.0 - 34.0 pg   MCHC 28.2 (L) 30.0 - 36.0 g/dL   RDW 82.9 (H) 56.2 - 13.0 %    Platelets 182 150 - 400 K/uL    Comment: REPEATED TO VERIFY   nRBC 0.0 0.0 - 0.2 %    Comment: Performed at [facility], [address], [place], [place] [address]  Lactic acid, plasma     Status: Abnormal   Collection Time: 01/27/21 10:59 PM  Result Value Ref Range   Lactic Acid, Venous 3.1 (HH) 0.5 - 1.9 mmol/L    Comment: CRITICAL VALUE NOTED.  VALUE IS CONSISTENT WITH PREVIOUSLY REPORTED AND CALLED VALUE. Performed at [facility], [address], [place], [place] [address]   Troponin I (High Sensitivity)     Status: Abnormal   Collection Time: 01/27/21 10:59 PM  Result Value Ref Range   Troponin I (High Sensitivity) 91 (H) <18 ng/L    Comment: (NOTE) Elevated high sensitivity troponin I (hsTnI) values and significant  changes across serial measurements may suggest ACS but many other  chronic and acute conditions are known to elevate hsTnI results.  Refer to the "Links" section for chest pain algorithms and additional  guidance. Performed at [facility], [address] [place], [place] [address]   Culture, blood (Routine X 2) w Reflex to ID Panel     Status: None (Preliminary result)   Collection Time: 01/27/21 10:59 PM   Specimen: BLOOD  Result Value Ref Range   Specimen Description BLOOD LEFT ANTECUBITAL    Special Requests      BOTTLES DRAWN AEROBIC AND ANAEROBIC Blood Culture adequate volume   Culture      NO GROWTH 2 DAYS Performed at [facility], [address], [place], [place] [address]    Report Status PENDING   Culture, blood (Routine X 2) w Reflex to ID Panel     Status: None (Preliminary result)   Collection Time: 01/27/21 10:59 PM   Specimen: BLOOD LEFT HAND  Result Value Ref Range   Specimen Description BLOOD LEFT HAND    Special Requests  BOTTLES DRAWN AEROBIC AND ANAEROBIC Blood Culture adequate volume   Culture      NO GROWTH 2 DAYS Performed at [facility], [address] [place], [place] [address]    Report  Status PENDING   Procalcitonin - Baseline     Status: None   Collection Time: 01/27/21 10:59 PM  Result Value Ref Range   Procalcitonin 0.50 ng/mL    Comment:        Interpretation: PCT (Procalcitonin) <= 0.5 ng/mL: Systemic infection (sepsis) is not likely. Local bacterial infection is possible. (NOTE)       Sepsis PCT Algorithm           Lower Respiratory Tract                                      Infection PCT Algorithm    ----------------------------     ----------------------------         PCT < 0.25 ng/mL                PCT < 0.10 ng/mL          Strongly encourage             Strongly discourage   discontinuation of antibiotics    initiation of antibiotics    ----------------------------     -----------------------------       PCT 0.25 - 0.50 ng/mL            PCT 0.10 - 0.25 ng/mL               OR       >80% decrease in PCT            Discourage initiation of                                            antibiotics      Encourage discontinuation           of antibiotics    ----------------------------     -----------------------------         PCT >= 0.50 ng/mL              PCT 0.26 - 0.50 ng/mL               AND        <80% decrease in PCT             Encourage initiation of                                             antibiotics       Encourage continuation           of antibiotics    ----------------------------     -----------------------------        PCT >= 0.50 ng/mL                  PCT > 0.50 ng/mL               AND         increase in PCT  Strongly encourage                                      initiation of antibiotics    Strongly encourage escalation           of antibiotics                                     -----------------------------                                           PCT <= 0.25 ng/mL                                                 OR                                        > 80% decrease in PCT                                       Discontinue / Do not initiate                                             antibiotics  Performed at [facility], [address] [place], [place] [address]   Ferritin     Status: None   Collection Time: 01/27/21 10:59 PM  Result Value Ref Range   Ferritin 121 24 - 336 ng/mL    Comment: Performed at [facility], [address] [place], [place] [address]  Iron and TIBC     Status: Abnormal   Collection Time: 01/27/21 10:59 PM  Result Value Ref Range   Iron 25 (L) 45 - 182 ug/dL   TIBC 098 119 - 147 ug/dL   Saturation Ratios 6 (L) 17.9 - 39.5 %   UIBC 375 ug/dL    Comment: Performed at [facility], [address], [place], [place] [address]  Bilirubin, direct     Status: Abnormal   Collection Time: 01/27/21 10:59 PM  Result Value Ref Range   Bilirubin, Direct 3.1 (H) 0.0 - 0.2 mg/dL    Comment: Performed at [facility], [address], [place], [place] [address]  HIV Antibody (routine testing w rflx)     Status: None   Collection Time: 01/27/21 10:59 PM  Result Value Ref Range   HIV Screen 4th Generation wRfx Non Reactive Non Reactive    Comment: Performed at [facility], [address], [place], [place] [address]  Comprehensive metabolic panel     Status: Abnormal   Collection Time: 01/27/21 10:59 PM  Result Value Ref Range   Sodium 135 135 - 145 mmol/L   Potassium 3.2 (L) 3.5 - 5.1 mmol/L   Chloride 96 (L)  98 - 111 mmol/L   CO2 26 22 - 32 mmol/L   Glucose, Bld 150 (H) 70 - 99 mg/dL    Comment: Glucose reference range applies only to samples taken after fasting for at least 8 hours.   BUN 26 (H) 6 - 20 mg/dL   Creatinine, Ser 9.97 (H) 0.61 - 1.24 mg/dL   Calcium 8.3 (L) 8.9 - 10.3 mg/dL   Total Protein 5.8 (L) 6.5 - 8.1 g/dL   Albumin 2.9 (L) 3.5 - 5.0 g/dL   AST 41 15 - 41 U/L   ALT 28 0 - 44 U/L   Alkaline Phosphatase 95 38 - 126 U/L   Total Bilirubin 6.6 (H) 0.3 - 1.2 mg/dL   GFR, Estimated >74 >14 mL/min    Comment:  (NOTE) Calculated using the CKD-EPI Creatinine Equation (2021)    Anion gap 13 5 - 15    Comment: Performed at [facility], [address], [place], [place] [address]  Heparin level (unfractionated)     Status: Abnormal   Collection Time: 01/28/21 10:04 AM  Result Value Ref Range   Heparin Unfractionated 0.25 (L) 0.30 - 0.70 IU/mL    Comment: (NOTE) The clinical reportable range upper limit is being lowered to >1.10 to align with the FDA approved guidance for the current laboratory assay.  If heparin results are below expected values, and patient dosage has  been confirmed, suggest follow up testing of antithrombin III levels. Performed at [facility], [address], [place], [place] [address]   Basic metabolic panel     Status: Abnormal   Collection Time: 01/28/21 10:20 PM  Result Value Ref Range   Sodium 132 (L) 135 - 145 mmol/L   Potassium 3.7 3.5 - 5.1 mmol/L   Chloride 94 (L) 98 - 111 mmol/L   CO2 27 22 - 32 mmol/L   Glucose, Bld 141 (H) 70 - 99 mg/dL    Comment: Glucose reference range applies only to samples taken after fasting for at least 8 hours.   BUN 30 (H) 6 - 20 mg/dL   Creatinine, Ser 4.35 (H) 0.61 - 1.24 mg/dL   Calcium 8.2 (L) 8.9 - 10.3 mg/dL   GFR, Estimated >68 >61 mL/min    Comment: (NOTE) Calculated using the CKD-EPI Creatinine Equation (2021)    Anion gap 11 5 - 15    Comment: Performed at [facility], [address], [place], [place] [address]  CBC     Status: Abnormal   Collection Time: 01/29/21  1:04 AM  Result Value Ref Range   WBC 23.9 (H) 4.0 - 10.5 K/uL   RBC 5.24 4.22 - 5.81 MIL/uL   Hemoglobin 10.2 (L) 13.0 - 17.0 g/dL   HCT 90.2 (L) 11.1 - 55.2 %   MCV 67.7 (L) 80.0 - 100.0 fL   MCH 19.5 (L) 26.0 - 34.0 pg   MCHC 28.7 (L) 30.0 - 36.0 g/dL   RDW 08.0 (H) 22.3 - 36.1 %   Platelets 167 150 - 400 K/uL    Comment: REPEATED TO VERIFY   nRBC 0.2 0.0 - 0.2 %    Comment: Performed at [facility], [address] [place], [place] [address]  Magnesium     Status: None   Collection Time: 01/29/21  1:04 AM  Result Value Ref Range   Magnesium 1.9 1.7 - 2.4 mg/dL    Comment: Performed at [facility], [address]  [place], [place] [address]  Basic metabolic panel     Status: Abnormal   Collection Time: 01/29/21  2:30 PM  Result Value Ref Range   Sodium 133 (L) 135 - 145 mmol/L   Potassium 3.1 (L) 3.5 - 5.1 mmol/L   Chloride 91 (L) 98 - 111 mmol/L   CO2 32 22 - 32 mmol/L   Glucose, Bld 172 (H) 70 - 99 mg/dL    Comment: Glucose reference range applies only to samples taken after fasting for at least 8 hours.   BUN 33 (H) 6 - 20 mg/dL   Creatinine, Ser 6.04 (H) 0.61 - 1.24 mg/dL   Calcium 8.3 (L) 8.9 - 10.3 mg/dL   GFR, Estimated >54 >09 mL/min    Comment: (NOTE) Calculated using the CKD-EPI Creatinine Equation (2021)    Anion gap 10 5 - 15    Comment: Performed at [facility], [address] [place], [place] [address]    ECG   Sinus tachycardia, PVCs, probable left atrial enlargement, bifascicular block and inferior infarct pattern- Personally Reviewed  Telemetry   Sinus rhythm with frequent PVCs, runs of nonsustained VT between 20-30 beats at times- Personally Reviewed  Radiology   ECHOCARDIOGRAM COMPLETE  Result Date: 01/28/2021    ECHOCARDIOGRAM REPORT   Patient Name:   Mark Garrett Date of Exam: 01/28/2021 Medical Rec #:  [id number]       Height:       71.0 in Accession #:    [id number]      Weight:       179.2 lb Date of Birth:  Jul 31, 1980        BSA:          2.013 m Patient Age:    40 years        BP:           114/91 mmHg Patient Gender: M               HR:           95 bpm. Exam Location:  Inpatient Procedure: 2D Echo, Cardiac Doppler and Color Doppler Indications:    CHF  History:        Patient has no prior history of Echocardiogram examinations.                 COPD.  Sonographer:    Cleatis Polka Referring Phys: [id number] OLADAPO ADEFESO IMPRESSIONS  1. Left  ventricular ejection fraction, by estimation, is 35 to 40%. The left ventricle has moderately decreased function. The left ventricle demonstrates global hypokinesis. The left ventricular internal cavity size was mildly dilated. There is moderate  eccentric left ventricular hypertrophy. Left ventricular diastolic parameters are indeterminate. There is the interventricular septum is flattened in systole and diastole, consistent with right ventricular pressure and volume overload.  2. Right ventricular systolic function is moderately reduced. The right ventricular size is normal.  3. Left atrial size was severely dilated.  4. Right atrial size was moderately dilated.  5. Eccentric posteriorly directed severe mitral regurgitation. Mitral valve leaflets thickened. Restricted anterior leaflet, posterior/anterior leaflets not fully coapting heavily calcified with calcified chordae. The mitral valve is abnormal. Severe mitral valve regurgitation.  6. Tricuspid valve regurgitation is moderate.  7. The aortic valve is normal in structure. Aortic valve regurgitation is mild. Comparison(s): No prior Echocardiogram. FINDINGS  Left Ventricle: Left ventricular ejection fraction, by estimation, is 35 to 40%. The left ventricle has moderately decreased function. The left ventricle demonstrates  global hypokinesis. The left ventricular internal cavity size was mildly dilated. There is moderate eccentric left ventricular hypertrophy. The interventricular septum is flattened in systole and diastole, consistent with right ventricular pressure and volume overload. Left ventricular diastolic parameters are indeterminate. Right Ventricle: The right ventricular size is normal. No increase in right ventricular wall thickness. Right ventricular systolic function is moderately reduced. Left Atrium: Left atrial size was severely dilated. Right Atrium: Right atrial size was moderately dilated. Pericardium: Trivial pericardial effusion is present.  Mitral Valve: Eccentric posteriorly directed severe mitral regurgitation. Mitral valve leaflets thickened. Restricted anterior leaflet, posterior/anterior leaflets not fully coapting heavily calcified with calcified chordae. The mitral valve is abnormal.  Severe mitral valve regurgitation. MV peak gradient, 16.8 mmHg. The mean mitral valve gradient is 8.0 mmHg. Tricuspid Valve: The tricuspid valve is normal in structure. Tricuspid valve regurgitation is moderate. Aortic Valve: The aortic valve is normal in structure. Aortic valve regurgitation is mild. Aortic regurgitation PHT measures 536 msec. Aortic valve peak gradient measures 5.0 mmHg. Pulmonic Valve: The pulmonic valve was normal in structure. Pulmonic valve regurgitation is mild to moderate. Aorta: The aortic root and ascending aorta are structurally normal, with no evidence of dilitation. IAS/Shunts: No atrial level shunt detected by color flow Doppler.  LEFT VENTRICLE PLAX 2D LVIDd:         6.70 cm      Diastology LVIDs:         5.10 cm      LV e' medial:    3.37 cm/s LV PW:         1.30 cm      LV E/e' medial:  46.3 LV IVS:        1.30 cm      LV e' lateral:   9.79 cm/s LVOT diam:     2.00 cm      LV E/e' lateral: 15.9 LV SV:         41 LV SV Index:   20 LVOT Area:     3.14 cm  LV Volumes (MOD) LV vol d, MOD A2C: 237.0 ml LV vol d, MOD A4C: 250.0 ml LV vol s, MOD A2C: 131.0 ml LV vol s, MOD A4C: 145.0 ml LV SV MOD A2C:     106.0 ml LV SV MOD A4C:     250.0 ml LV SV MOD BP:      105.2 ml RIGHT VENTRICLE            IVC RV Basal diam:  5.20 cm    IVC diam: 2.00 cm RV Mid diam:    4.10 cm RV S prime:     8.49 cm/s TAPSE (M-mode): 1.5 cm LEFT ATRIUM              Index        RIGHT ATRIUM           Index LA diam:        5.80 cm  2.88 cm/m   RA Area:     26.40 cm LA Vol (A2C):   122.0 ml 60.62 ml/m  RA Volume:   85.90 ml  42.68 ml/m LA Vol (A4C):   92.2 ml  45.81 ml/m LA Biplane Vol: 108.0 ml 53.66 ml/m  AORTIC VALVE AV Area (Vmax): 2.56 cm AV Vmax:         112.00 cm/s AV Peak Grad:   5.0 mmHg LVOT Vmax:      91.40 cm/s LVOT Vmean:     67.000 cm/s  LVOT VTI:       0.129 m AI PHT:         536 msec  AORTA Ao Root diam: 2.90 cm Ao Asc diam:  2.80 cm MITRAL VALVE                TRICUSPID VALVE MV Area (PHT): 6.27 cm     TR Peak grad:   52.4 mmHg MV Area VTI:   1.06 cm     TR Vmax:        362.00 cm/s MV Peak grad:  16.8 mmHg MV Mean grad:  8.0 mmHg     SHUNTS MV Vmax:       2.05 m/s     Systemic VTI:  0.13 m MV Vmean:      139.0 cm/s   Systemic Diam: 2.00 cm MV Decel Time: 121 msec MR Peak grad: 91.9 mmHg MR Mean grad: 61.0 mmHg MR Vmax:      479.33 cm/s MR Vmean:     376.7 cm/s MV E velocity: 156.00 cm/s MV A velocity: 92.70 cm/s MV E/A ratio:  1.68 Photographer signed by Carolan Clines Signature Date/Time: 01/28/2021/12:24:20 PM    Final     Cardiac Studies   See echo above-personally reviewed  Impression   Principal Problem:   Acute systolic CHF (congestive heart failure) (HCC) Active Problems:   CAP (community acquired pneumonia)   Acute respiratory failure with hypoxia (HCC)   Pleural effusion on right   Leukocytosis   Microcytic anemia   Hypoalbuminemia   Elevated brain natriuretic peptide (BNP) level   Demand ischemia (HCC)   DVT (deep venous thrombosis) (HCC)   Cigarette smoker   COPD (chronic obstructive pulmonary disease) (HCC)   Asthma   Severe mitral regurgitation   Serum total bilirubin elevated   Paroxysmal VT   Recommendation   Chest pain/NSTEMI Mr. Bogacz is describing symptoms concerning for unstable angina that occurred the night before admission.  He is noted to have troponin elevation which was mild and has declined slowly.  He is noted to have a prior bypass after review of his CT imaging.  This could represent bypass graft ischemia.  We will plan to transition him off of Xarelto to heparin in order to allow cardiac catheterization.  He needs to be on therapeutic doses given recent DVT in September.  We will  also begin aspirin 81 mg daily.  Hold on beta-blocker at this point as he is in decompensated heart failure but will be treated with amiodarone.  He is having nonsustained VT which will hopefully quiet down with the amiodarone.  We will check a fasting lipid profile but given his young age I am highly suspicious of familial hyperlipidemia -start appropriate statin therapy. Coronary artery disease/probable CABG Although he is a poor historian, it appears Mr. Linse had prior CABG in [place] around 2006.  He does have a midline sternotomy scar and pointed out that vein was taken from his leg.  No records are currently available.  We will try to obtain those.  Based on elevated troponin and a decline in LVEF, he will need a repeat heart catheterization this week.  Probably right and left.  He is currently on Xarelto which we will hold to allow that.  Transition to heparin and medical therapy for chest pain/NSTEMI as above. Acute systolic congestive heart failure/cardiogenic shock LVEF is newly declined to 35 to 40% with severe mitral regurgitation, probably moderate tricuspid regurgitation, mild to moderate aortic  insufficiency as well.  He will need further imaging of the valves with TEE likely at some point for work-up of his MR.  I am concerned about low output given a reduced LVEF and significant mitral regurgitation.  His lactate is significantly elevated along with a leukocytosis.  Procalcitonin is low.  We will place a PICC line and obtain Co.oximetry panels to guide heart failure therapy.  Good diuresis with Lasix.  We will continue 40 mg IV twice daily.  He is 1.3 L negative today and 7.9 L negative overall. Recent DVT Hold Xarelto and transition to heparin per pharmacy to allow cardiac catheterization this week.  Full code.  May need advanced heart failure evaluation next week.  CRITICAL CARE TIME: I have spent a total of 65 minutes with patient reviewing hospital notes, telemetry, EKGs,  labs and examining the patient as well as establishing an assessment and plan that was discussed with the patient.  > 50% of time was spent in direct patient care. The patient is critically ill with multi-organ system failure and requires high complexity decision making for assessment and support, frequent evaluation and titration of therapies, application of advanced monitoring technologies and extensive interpretation of multiple databases.   Length of Stay:  LOS: 2 days   Chrystie Nose, MD, [facility], FACP  [place]  [facility]  Medical Director of the [facility]  [facility] Diplomate of the American Board of Clinical Lipidology Attending Cardiologist  Direct Dial: [phone]  Fax: [phone]  Website:  [email or web address] 01/29/2021, 4:10 PM

## 2021-01-29 NOTE — Progress Notes (Signed)
Spoke with Wendie Chess, stated pt just arrived to room.  States ok to arrive for PICC placement at 1730.

## 2021-01-29 NOTE — Assessment & Plan Note (Signed)
--   Probably secondary to steroids, follow clinically

## 2021-01-29 NOTE — Progress Notes (Signed)
Pt left hand IV occluded upon assessment. Amiodarone paused and VAST team bedside attempting a PICC line. Insertion unsuccessful. Awaiting recommendations. Will restart Amiodarone on other IV site once Mag & K+ gtt are finished.

## 2021-01-29 NOTE — Progress Notes (Signed)
Patient has 5bts of BBB; VT, on call notified no new order. We continue to monitor

## 2021-01-29 NOTE — Assessment & Plan Note (Signed)
-   Suspect new diagnosis. -- Cardiology consultation

## 2021-01-29 NOTE — Progress Notes (Addendum)
Attempted to place a PICC line x 2 with no success. Unable to thread the guidewire beyond the subclavian region. Multiple attempts to reposition arm to advance PICC. Fibrous tissue noted with insertion of PICC line once advanced into the subclavian region. Pt stated he felt "his heart beating." Immediately withdrew PICC line. Arm cleaned thoroughly with CHG and biopatch applied over insertion site, pressure held to site x 10 minutes.Caryn Bee Primary RN aware.  Dr Imogene Burn notified of difficulties and notations with insertion process.  Dr Imogene Burn to attempt CVC.

## 2021-01-29 NOTE — Assessment & Plan Note (Signed)
--   Appears asymptomatic at this point, continue antibiotic

## 2021-01-29 NOTE — Progress Notes (Signed)
Pt admitted to 6E AxOx4, VS wnL and as per flow. Pt oriented to 6E processes. Amiodarone, Potassium and Magnesium IV currently running. All questions and concerns addressed. Call bell placed within reach, will continue to monitor and maintain safety.

## 2021-01-29 NOTE — Hospital Course (Addendum)
40 year old man PMH including COPD, DVT, ran out of anticoagulation several days prior, presented with throbbing chest pain with radiation to left arm and shoulder, hypotensive in the field and given fluids.  Noted to be hypoxic. --11/10 bilateral lower extremity Doppler showed indeterminate DVT.  Admitted for acute hypoxic respiratory failure, pneumonia, COPD exacerbation, lactic acidosis, chest pain rule out with elevated troponin -- 11/11 echocardiogram revealed systolic dysfunction, global hypokinesis, unclear whether new or old, started on diuresis -- 11/12 patient reports chest pain resolved, was throbbing left shoulder left arm and left jaw, neck prior to admission with associated shortness of breath.  Multiple episodes of ectopy and NSVT.  Seen by cardiology.  Transferred to cardiac unit, started on heparin, amiodarone.  Central line placed, unable to place PICC. --11/13 feels okay today.  Seen by cardiology and advanced heart failure.  Started on milrinone. --11/14 no chest pain, feels okay today, breathing okay.  Dominant issues are cardiac in nature.  Pneumonia appears to be resolving rapidly would complete total 5 days antibiotics.  Discussed with Dr. Shirlee Latch who has graciously accepted the patient to his service.  I will follow-up in consult tomorrow.

## 2021-01-29 NOTE — Assessment & Plan Note (Addendum)
--  Isolated, etiology unclear, reports occasional alcohol use only, suppose could be related to CHF. --trend Hgb

## 2021-01-29 NOTE — Progress Notes (Signed)
Progress Note Mark Garrett   WEX:[id number]  DOB: 27-Nov-1980  DOA: 01/27/2021     2 Date of Service: 01/29/2021   Clinical Course 40 year old man PMH including COPD, DVT, ran out of anticoagulation several days prior, presented with throbbing chest pain with radiation to left arm and shoulder, hypotensive in the field and given fluids.  Noted to be hypoxic. --11/10 bilateral lower extremity Doppler showed indeterminate DVT.  Admitted for acute hypoxic respiratory failure, pneumonia, COPD exacerbation, lactic acidosis, chest pain rule out with elevated troponin -- 11/11 echocardiogram revealed systolic dysfunction, global hypokinesis, unclear whether new or old, started on diuresis -- 11/12 patient reports chest pain resolved, was throbbing left shoulder left arm and left jaw, neck prior to admission with associated shortness of breath.  Multiple episodes of ectopy and NSVT  Assessment and Plan * Acute systolic CHF (congestive heart failure) (HCC) -- No previous information in epic or Care Everywhere, may be new diagnosis, LVEF 35-40% with global hypokinesis -- Bilateral lower extremity edema present 3 to 4 days per patient, some concern for recent cardiac event -- No chest pain now or shortness of breath.  Continue diuresis. -- We will need to start goal-directed therapy prior to discharge, will consult cardiology for further recommendations  Acute respiratory failure with hypoxia (HCC) -- H&P documents SPO2 87%, now resolved off oxygen, suspect secondary to CHF, pneumonia.  Demand ischemia (HCC) -- Troponins flat, history suggests recent ACS although echocardiogram does not show focal abnormalities.  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  Poor historian. -- Currently asymptomatic, pain-free, will consult cardiology for their further recommendations  CAP (community acquired pneumonia) -- Appears asymptomatic at this point, continue antibiotic  Paroxysmal VT --  PVCs and ectopy 9, with 138 beat run and shorter runs as well -- Magnesium below goal, will replace, check potassium and supplement   Severe mitral regurgitation - Suspect new diagnosis. -- Cardiology consultation  COPD (chronic obstructive pulmonary disease) (HCC) -- No wheezing on exam, good air movement, appears stable, apparently not on bronchodilators -- Recommend smoking cessation  DVT (deep venous thrombosis) (HCC) -- Bilateral lower extremity DVT.  Suspect chronic by history.  Imaging, CT did not show large embolus, will continue anticoagulation  Serum total bilirubin elevated --Isolated, etiology unclear, reports occasional alcohol use only, suppose could be related to CHF. --trend Hgb  Microcytic anemia -- Etiology unclear microcytosis significant -- Iron supplementation, follow-up as an outpatient  Leukocytosis -- Probably secondary to steroids, follow clinically  Cigarette smoker -- Recommend cessation  Subjective:  No shortness of breath, no chest pain.  He reports on admission, prior to admission, he had throbbing left shoulder pain radiating down to his left hand and up into his jaw, neck and face.  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  Was on anticoagulation but ran out.  Lives alone, not on dialysis ability, on food stamps.  Poor historian.  Objective Vitals:   01/28/21 2027 01/29/21 0500 01/29/21 0531 01/29/21 0747  BP: 113/84  112/78 113/78  Pulse: 100  96 92  Resp: 19  20 20   Temp: 97.7 F (36.5 C)  (!) 97.5 F (36.4 C) 97.9 F (36.6 C)  TempSrc: Oral  Oral Oral  SpO2: 100%  99% 99%  Weight:  82.3 kg    Height:       82.3 kg  Vitals reviewed.  Exam Physical Exam  Vitals reviewed.  Constitutional:      General: He is not in acute distress.    Appearance: He is not ill-appearing or toxic-appearing.  Cardiovascular:     Rate and Rhythm: Normal rate and regular rhythm.      Heart sounds: No murmur heard.    Comments: Telemetry with multiple episodes of PVCs, 138 beat run of wide-complex tachycardia regular Pulmonary:     Effort: Pulmonary effort is normal. No respiratory distress.     Breath sounds: No wheezing, rhonchi or rales.  Musculoskeletal:     Right lower leg: Edema present.     Left lower leg: Edema present.     Comments: 2-3+  Neurological:     Mental Status: He is alert.  Psychiatric:        Mood and Affect: Mood normal.        Behavior: Behavior normal.     Labs / Other Information My review of labs, imaging, notes and other tests is significant for Creatinine stable 1.37 yesterday, WBC up to 23.9 on steroids, hemoglobin stable 10.2, magnesium at 1 AM was 1.9.   Echocardiogram reviewed showed LVEF 30 to 40% with global hypokinesis, severe mitral regurgitation  Disposition Plan: Status is: Inpatient  Remains inpatient appropriate because: Volume overload requiring IV diuresis, further evaluation for new systolic dysfunction with possible recent ACS, multiple episodes of ectopy and 1 long run of wide complex regular tachycardia.  On apixaban No family present   Time spent: 50 minutes [facility] 01/29/2021, 2:19 PM

## 2021-01-29 NOTE — Assessment & Plan Note (Addendum)
--   No previous information in epic or Care Everywhere, may be new diagnosis, LVEF 35-40% with global hypokinesis -- Bilateral lower extremity edema present 3 to 4 days per patient, some concern for recent cardiac event -- No chest pain now or shortness of breath.  Continue diuresis. -- We will need to start goal-directed therapy prior to discharge, will consult cardiology for further recommendations

## 2021-01-29 NOTE — Assessment & Plan Note (Signed)
Recommend cessation. ?

## 2021-01-29 NOTE — Progress Notes (Signed)
   X-cover note: Received call from vascular access team. They were unsuccessful x 2 to place PICC line. Pt on amiodarone gtts and heparin gtts. Both he has not been receiving for several hours due to lack of vascular access.  PICC team does not think pt will be able to get PICC/Midline due to severe scarring of his peripheral veins.  They asked me to place central venous line.  Carollee Herter, DO

## 2021-01-29 NOTE — Assessment & Plan Note (Addendum)
--   Bilateral lower extremity DVT.  Suspect chronic by history.  Imaging, CT did not show large embolus, will continue anticoagulation

## 2021-01-29 NOTE — Assessment & Plan Note (Signed)
--   PVCs and ectopy 9, with 138 beat run and shorter runs as well -- Magnesium below goal, will replace, check potassium and supplement

## 2021-01-29 NOTE — Progress Notes (Signed)
Patient had 9bts of PVCs at 6:28 am, patient stable no s/s noted, patient denied any discomfort. This RN message the on-call but still waiting for her respond. Will pass it on in report to the next shift. We continue to monitor.

## 2021-01-29 NOTE — Procedures (Addendum)
   Procedure: Central venous line insertion, left internal jugular Indication: Acute systolic heart failure Operator: Carollee Herter, DO  Description: After verbal and written consent was obtained from the patient and witnessed by the nurse, the patient is placed in the supine position.  The left internal jugular vein area was sterilely prepped and draped with 2% chlorhexidine and allowed to dry.  Maximum sterile barrier precautions were taken.  The area just superficial to the left internal jugular vein was anesthetized with 5 cc of 1% lidocaine.  Subsequently, the left internal jugular vein was punctured with an 18-gauge needle with direct ultrasound guidance.  Dark red nonpulsatile venous blood was aspirated.  Guidewire was inserted through the needle and the needle was subsequently removed in its entirety.  Skin was nicked with a #11 scalpel blade.  Tissue was dilated with a tissue dilator over the guidewire.  Tissue dilator was removed.  Subsequently, a previously saline flush triple-lumen 7 French catheter was placed over the guidewire using a modified Seldinger technique.  Catheter was advanced to the 18 cm mark on the catheter and the guidewire was moved its entirety.  All 3 ports were aspirated for dark red nonpulsatile venous blood separately flushed with 10 cc normal saline.  Sterile Biopatch was placed at the insertion site.  Catheter was sutured to the skin with silk suture.  Sterile Tegaderm dressing was placed over the insertion site.  Patient tolerated procedure without complication or difficulty.  Stat portable chest x-ray was obtained and documents the terminal tip of the catheter in the superior vena cava.  No large pneumothorax was seen.  Catheter is ready for immediate use.  Carollee Herter, DO [facility]

## 2021-01-29 NOTE — Significant Event (Addendum)
Rapid Response Event Note   Reason for Call :  Initiation of Amiodarone  Initial Focused Assessment:  Called to start amiodarone bolus and gtt on this patient. Patient has been in sinus rhythm with frequent PVCs and has had nonsustained runs of VT (20-30 beats per episode). He states that he felt like he felt a lot of pressure in his chest when the event happens  He denies chest pain currently. He states that he has mild SOB. Currently, K and Mg are being replaced.   Interventions:  Amiodarone gtt and bolus  Plan of Care:  Patient was transferred to 6E    Event Summary:   MD Notified: Dr. Rennis Golden bedside Call Time: 1600 Arrival Time: 1605 End Time: 1730  Andrey Spearman, RN

## 2021-01-29 NOTE — Progress Notes (Addendum)
ANTICOAGULATION CONSULT NOTE  Pharmacy Consult to transition back to heparin Indication: pre-existing DVT  Allergies  Allergen Reactions   Iodide Rash    Patient Measurements: Height: 5\' 11"  (180.3 cm) Weight: 82.3 kg (181 lb 7 oz) IBW/kg (Calculated) : 75.3  Heparin Dosing Weight: 72.6 kg  Vital Signs: Temp: 97.9 F (36.6 C) (11/12 1552) Temp Source: Oral (11/12 0747) BP: 121/85 (11/12 1552) Pulse Rate: 96 (11/12 1552)  Labs: Recent Labs    01/27/21 1032 01/27/21 1245 01/27/21 1536 01/27/21 1742 01/27/21 2259 01/28/21 1004 01/28/21 2220 01/29/21 0104 01/29/21 1430  HGB 10.1*  --   --   --  10.9*  --   --  10.2*  --   HCT 36.9*  --   --   --  38.7*  --   --  35.5*  --   PLT 195  --   --   --  182  --   --  167  --   LABPROT  --   --  26.7*  --   --   --   --   --   --   INR  --   --  2.5*  --   --   --   --   --   --   HEPARINUNFRC  --   --   --   --  0.29* 0.25*  --   --   --   CREATININE 1.39*  --   --   --  1.30*  --  1.37*  --  1.32*  TROPONINIHS 99* 108*  --  105* 91*  --   --   --   --      Estimated Creatinine Clearance: 77.6 mL/min (A) (by C-G formula based on SCr of 1.32 mg/dL (H)).   Medications:  Medications Prior to Admission  Medication Sig Dispense Refill Last Dose   XARELTO 20 MG TABS tablet Take 20 mg by mouth every evening.   01/25/2021 at 1900   Infusions:   amiodarone 60 mg/hr (01/29/21 1624)   Followed by   amiodarone     magnesium sulfate bolus IVPB 2 g (01/29/21 1545)   potassium chloride 10 mEq (01/29/21 1553)     Assessment: 40 y.o. male with medical history significant for asthma/COPD, recent DVT, tobacco abuse who presented with 3 days SOB and CP. CT showed no evidence of pulmonary embolism.. 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 Pharmacy consulted to initiate heparin infusion.   Patient was previously prescribed Xarelto to treat DVT but has been out of the medication. He was started on  heparin on admit then later transitioned to apixaban yesterday. Cardiology is recommending cardiac cath this week so will transition back to heparin tonight (last apixaban dose was this am 11/12). Will need to follow appt's until they correlate with heparin levels given recent apixaban use.    Goal of Therapy:  Aptt goal 66-102s Heparin level 0.3-0.7 units/ml Monitor platelets by anticoagulation protocol: Yes   Plan:  Start heparin at 1450 units/hr tonight Continue to monitor daily cbc, heparin level, and aptt Follow up timing of cardiac cath  Thank you for allowing pharmacy to be a part of this patient's care.  13/12 PharmD., BCPS Clinical Pharmacist 01/29/2021 4:32 PM

## 2021-01-29 NOTE — Assessment & Plan Note (Signed)
--   H&P documents SPO2 87%, now resolved off oxygen, suspect secondary to CHF, pneumonia.

## 2021-01-30 DIAGNOSIS — I214 Non-ST elevation (NSTEMI) myocardial infarction: Principal | ICD-10-CM

## 2021-01-30 DIAGNOSIS — R570 Cardiogenic shock: Secondary | ICD-10-CM

## 2021-01-30 LAB — LIPID PANEL
Cholesterol: 97 mg/dL (ref 0–200)
HDL: 11 mg/dL — ABNORMAL LOW (ref >40)
LDL Cholesterol: 74 mg/dL (ref 0–99)
Total CHOL/HDL Ratio: 8.8 RATIO
Triglycerides: 58 mg/dL (ref <150)
VLDL: 12 mg/dL (ref 0–40)

## 2021-01-30 LAB — COOXEMETRY PANEL
Carboxyhemoglobin: 1.1 % (ref 0.5–1.5)
Carboxyhemoglobin: 1.7 % — ABNORMAL HIGH (ref 0.5–1.5)
Methemoglobin: 0.9 % (ref 0.0–1.5)
Methemoglobin: 0.9 % (ref 0.0–1.5)
O2 Saturation: 38.7 %
O2 Saturation: 70.3 %
Total hemoglobin: 10.3 g/dL — ABNORMAL LOW (ref 12.0–16.0)
Total hemoglobin: 9.7 g/dL — ABNORMAL LOW (ref 12.0–16.0)

## 2021-01-30 LAB — CBC
HCT: 36.6 % — ABNORMAL LOW (ref 39.0–52.0)
Hemoglobin: 10.2 g/dL — ABNORMAL LOW (ref 13.0–17.0)
MCH: 19.3 pg — ABNORMAL LOW (ref 26.0–34.0)
MCHC: 27.9 g/dL — ABNORMAL LOW (ref 30.0–36.0)
MCV: 69.2 fL — ABNORMAL LOW (ref 80.0–100.0)
Platelets: 157 10*3/uL (ref 150–400)
RBC: 5.29 MIL/uL (ref 4.22–5.81)
RDW: 23.4 % — ABNORMAL HIGH (ref 11.5–15.5)
WBC: 21.3 10*3/uL — ABNORMAL HIGH (ref 4.0–10.5)
nRBC: 0.7 % — ABNORMAL HIGH (ref 0.0–0.2)

## 2021-01-30 LAB — HEPARIN LEVEL (UNFRACTIONATED)
Heparin Unfractionated: >1.1 IU/mL — ABNORMAL HIGH (ref 0.30–0.70)
Heparin Unfractionated: >1.1 IU/mL — ABNORMAL HIGH (ref 0.30–0.70)

## 2021-01-30 LAB — COMPREHENSIVE METABOLIC PANEL
ALT: 37 U/L (ref 0–44)
AST: 46 U/L — ABNORMAL HIGH (ref 15–41)
Albumin: 2.9 g/dL — ABNORMAL LOW (ref 3.5–5.0)
Alkaline Phosphatase: 92 U/L (ref 38–126)
Anion gap: 15 (ref 5–15)
BUN: 37 mg/dL — ABNORMAL HIGH (ref 6–20)
CO2: 28 mmol/L (ref 22–32)
Calcium: 8.3 mg/dL — ABNORMAL LOW (ref 8.9–10.3)
Chloride: 90 mmol/L — ABNORMAL LOW (ref 98–111)
Creatinine, Ser: 1.39 mg/dL — ABNORMAL HIGH (ref 0.61–1.24)
GFR, Estimated: >60 mL/min (ref >60)
Glucose, Bld: 113 mg/dL — ABNORMAL HIGH (ref 70–99)
Potassium: 3.8 mmol/L (ref 3.5–5.1)
Sodium: 133 mmol/L — ABNORMAL LOW (ref 135–145)
Total Bilirubin: 3.3 mg/dL — ABNORMAL HIGH (ref 0.3–1.2)
Total Protein: 5.9 g/dL — ABNORMAL LOW (ref 6.5–8.1)

## 2021-01-30 LAB — APTT
aPTT: 146 seconds — ABNORMAL HIGH (ref 24–36)
aPTT: 136 seconds — ABNORMAL HIGH (ref 24–36)
aPTT: 119 seconds — ABNORMAL HIGH (ref 24–36)

## 2021-01-30 LAB — LACTIC ACID, PLASMA
Lactic Acid, Venous: 3 mmol/L (ref 0.5–1.9)
Lactic Acid, Venous: 2.4 mmol/L (ref 0.5–1.9)

## 2021-01-30 LAB — BASIC METABOLIC PANEL
Anion gap: 14 (ref 5–15)
BUN: 37 mg/dL — ABNORMAL HIGH (ref 6–20)
CO2: 33 mmol/L — ABNORMAL HIGH (ref 22–32)
Calcium: 8.1 mg/dL — ABNORMAL LOW (ref 8.9–10.3)
Chloride: 88 mmol/L — ABNORMAL LOW (ref 98–111)
Creatinine, Ser: 1.18 mg/dL (ref 0.61–1.24)
GFR, Estimated: >60 mL/min (ref >60)
Glucose, Bld: 119 mg/dL — ABNORMAL HIGH (ref 70–99)
Potassium: 2.9 mmol/L — ABNORMAL LOW (ref 3.5–5.1)
Sodium: 135 mmol/L (ref 135–145)

## 2021-01-30 LAB — MAGNESIUM
Magnesium: 2.3 mg/dL (ref 1.7–2.4)
Magnesium: 2.6 mg/dL — ABNORMAL HIGH (ref 1.7–2.4)

## 2021-01-30 LAB — GLUCOSE, CAPILLARY: Glucose-Capillary: 106 mg/dL — ABNORMAL HIGH (ref 70–99)

## 2021-01-30 MED ORDER — SODIUM CHLORIDE 0.9% FLUSH: 3.0000 mL | INTRAVENOUS | Status: DC | PRN

## 2021-01-30 MED ORDER — LOSARTAN POTASSIUM 25 MG PO TABS
12.5000 mg | ORAL_TABLET | Freq: Two times a day (BID) | ORAL | Status: DC
  Administered 2021-01-30 (×2): 12.5 mg via ORAL
  Filled 2021-01-30 (×3): qty 1

## 2021-01-30 MED ORDER — POTASSIUM CHLORIDE CRYS ER 20 MEQ PO TBCR
40.0000 meq | EXTENDED_RELEASE_TABLET | ORAL | Status: AC
  Administered 2021-01-30 (×2): 40 meq via ORAL
  Filled 2021-01-30 (×2): qty 2

## 2021-01-30 MED ORDER — MAGNESIUM SULFATE 2 GM/50ML IV SOLN
INTRAVENOUS | Status: AC
  Filled 2021-01-30: qty 50

## 2021-01-30 MED ORDER — ROSUVASTATIN CALCIUM 20 MG PO TABS
40.0000 mg | ORAL_TABLET | Freq: Every day | ORAL | Status: DC
  Administered 2021-01-30 – 2021-02-16 (×18): 40 mg via ORAL
  Filled 2021-01-30 (×18): qty 2

## 2021-01-30 MED ORDER — FUROSEMIDE 10 MG/ML IJ SOLN
80.0000 mg | Freq: Two times a day (BID) | INTRAMUSCULAR | Status: DC
  Administered 2021-01-30: 80 mg via INTRAVENOUS
  Filled 2021-01-30 (×2): qty 8

## 2021-01-30 MED ORDER — HEPARIN (PORCINE) 25000 UT/250ML-% IV SOLN
1300.0000 [IU]/h | INTRAVENOUS | Status: DC
  Administered 2021-01-30 (×2): 1300 [IU]/h via INTRAVENOUS
  Filled 2021-01-30: qty 250

## 2021-01-30 MED ORDER — POTASSIUM CHLORIDE 10 MEQ/100ML IV SOLN
10.0000 meq | INTRAVENOUS | Status: AC
  Administered 2021-01-30 – 2021-01-31 (×3): 10 meq via INTRAVENOUS
  Filled 2021-01-30 (×4): qty 100

## 2021-01-30 MED ORDER — SODIUM CHLORIDE 0.9% FLUSH
10.0000 mL | Freq: Two times a day (BID) | INTRAVENOUS | Status: DC
  Administered 2021-01-30: 40 mL
  Administered 2021-01-30 – 2021-02-15 (×21): 10 mL
  Administered 2021-02-16: 30 mL

## 2021-01-30 MED ORDER — SODIUM CHLORIDE 0.9% FLUSH: 10.0000 mL | INTRAVENOUS | Status: DC | PRN

## 2021-01-30 MED ORDER — FUROSEMIDE 10 MG/ML IJ SOLN
80.0000 mg | Freq: Two times a day (BID) | INTRAMUSCULAR | Status: DC
  Filled 2021-01-30: qty 8

## 2021-01-30 MED ORDER — FUROSEMIDE 10 MG/ML IJ SOLN
40.0000 mg | Freq: Once | INTRAMUSCULAR | Status: AC
  Administered 2021-01-30: 40 mg via INTRAVENOUS

## 2021-01-30 MED ORDER — MAGNESIUM SULFATE 2 GM/50ML IV SOLN: 2.0000 g | Freq: Once | INTRAVENOUS | Status: DC

## 2021-01-30 MED ORDER — SODIUM CHLORIDE 0.9 % IV SOLN
250.0000 mL | INTRAVENOUS | Status: DC | PRN
  Administered 2021-01-30: 250 mL via INTRAVENOUS

## 2021-01-30 MED ORDER — SODIUM CHLORIDE 0.9 % IV SOLN
510.0000 mg | Freq: Once | INTRAVENOUS | Status: AC
  Administered 2021-01-30: 510 mg via INTRAVENOUS
  Filled 2021-01-30: qty 17

## 2021-01-30 MED ORDER — SODIUM CHLORIDE 0.9% FLUSH
3.0000 mL | Freq: Two times a day (BID) | INTRAVENOUS | Status: DC
  Administered 2021-01-30 – 2021-02-16 (×13): 3 mL via INTRAVENOUS

## 2021-01-30 MED ORDER — AMIODARONE HCL IN DEXTROSE 360-4.14 MG/200ML-% IV SOLN: 30.0000 mg/h | INTRAVENOUS | Status: DC

## 2021-01-30 MED ORDER — AMIODARONE LOAD VIA INFUSION
150.0000 mg | Freq: Once | INTRAVENOUS | Status: AC
Start: 2021-01-30 — End: 2021-01-30
  Administered 2021-01-30: 150 mg via INTRAVENOUS
  Filled 2021-01-30: qty 83.34

## 2021-01-30 MED ORDER — SPIRONOLACTONE 12.5 MG HALF TABLET
12.5000 mg | ORAL_TABLET | Freq: Every day | ORAL | Status: DC
  Administered 2021-01-30: 12.5 mg via ORAL
  Filled 2021-01-30 (×2): qty 1

## 2021-01-30 MED ORDER — SODIUM CHLORIDE 0.9 % IV SOLN: INTRAVENOUS | Status: DC

## 2021-01-30 MED ORDER — AMIODARONE HCL IN DEXTROSE 360-4.14 MG/200ML-% IV SOLN
30.0000 mg/h | INTRAVENOUS | Status: AC
  Administered 2021-01-30 – 2021-01-31 (×4): 60 mg/h via INTRAVENOUS
  Filled 2021-01-30 (×3): qty 200

## 2021-01-30 MED ORDER — POTASSIUM CHLORIDE CRYS ER 20 MEQ PO TBCR
40.0000 meq | EXTENDED_RELEASE_TABLET | Freq: Once | ORAL | Status: AC
  Administered 2021-01-30: 40 meq via ORAL
  Filled 2021-01-30: qty 2

## 2021-01-30 MED ORDER — ASPIRIN 81 MG PO CHEW
81.0000 mg | CHEWABLE_TABLET | ORAL | Status: AC
  Administered 2021-01-31: 81 mg via ORAL
  Filled 2021-01-30: qty 1

## 2021-01-30 MED ORDER — HEPARIN (PORCINE) 25000 UT/250ML-% IV SOLN: 1200.0000 [IU]/h | INTRAVENOUS | Status: DC

## 2021-01-30 MED ORDER — MILRINONE LACTATE IN DEXTROSE 20-5 MG/100ML-% IV SOLN
0.3750 ug/kg/min | INTRAVENOUS | Status: DC
  Administered 2021-01-30 (×2): 0.25 ug/kg/min via INTRAVENOUS
  Administered 2021-01-31 – 2021-02-16 (×29): 0.375 ug/kg/min via INTRAVENOUS
  Filled 2021-01-30 (×39): qty 100

## 2021-01-30 NOTE — Assessment & Plan Note (Signed)
--   Low output systolic CHF complicated by severe mitral regurgitation.  Milrinone and further management per cardiology.

## 2021-01-30 NOTE — Progress Notes (Signed)
DAILY PROGRESS NOTE   Patient Name: Mark Garrett Date of Encounter: 01/30/2021 Cardiologist: None  Chief Complaint   Fatigued, did not sleep well  Patient Profile   40 year old male with shortness of breath and weakness, history of asthma/COPD, now found to have acute systolic congestive heart failure.  Subjective   [name] is fatigued today- appreciate overnight placement of TLC as PICC could not be placed, his COOX is very low at 38% and did not result until 0144. Creatinine remains stable - urine output still good, net negative -1.7L yesterday, now 8.3L negative. Started on amiodarone yesterday for NSVT up to 20-25 beats, this is better overnight - still with PVC's, couplets and triplets. Mg 2.3, K 3.8.  Objective   Vitals:   01/29/21 1909 01/29/21 2000 01/30/21 0047 01/30/21 0608  BP: (!) 131/94 (!) 127/91 (!) 129/105 (!) 140/103  Pulse:  95  88  Resp: _0 Temp:  97.7 F (36.5 C) 98 F (36.7 C) 97.6 F (36.4 C)  TempSrc:  Oral Oral Oral  SpO2:  93% 94% 94%  Weight:    82 kg  Height:        Intake/Output Summary (Last 24 hours) at 01/30/2021 0805 Last data filed at 01/30/2021 8372 Gross per 24 hour  Intake 1118.97 ml  Output 2825 ml  Net -1706.03 ml   Filed Weights   01/28/21 0523 01/29/21 0500 01/30/21 9021  Weight: 81.3 kg 82.3 kg 82 kg    Physical Exam   General appearance: moderate distress and somnolent, curled up on his side Neck: JVD - a few cm above sternal notch, no carotid bruit, supple, symmetrical, trachea midline, and thyroid not enlarged, symmetric, no tenderness/mass/nodules Lungs: diminished breath sounds RLL and RML Heart: regular rate and rhythm and ectopy noted Abdomen: soft, non-tender; bowel sounds normal; no masses,  no organomegaly Extremities: extremities normal, atraumatic, no cyanosis or edema and s/p left foot amputation Pulses: weak distal pulses Skin: cool, dry Neurologic: Mental status: awakens to voice,  follows commands Appears fatigued  Inpatient Medications    Scheduled Meds:  amoxicillin-clavulanate  1 tablet Oral Q12H   aspirin  81 mg Oral Daily   Chlorhexidine Gluconate Cloth  6 each Topical Daily   dextromethorphan-guaiFENesin  1 tablet Oral BID   feeding supplement  237 mL Oral BID BM   furosemide  40 mg Intravenous Q12H   multivitamin with minerals  1 tablet Oral Daily   nicotine  21 mg Transdermal QHS   pantoprazole  40 mg Oral Daily   sodium chloride flush  10-40 mL Intracatheter Q12H    Continuous Infusions:  amiodarone 30 mg/hr (01/30/21 0708)   heparin 1,450 Units/hr (01/29/21 2118)   milrinone      PRN Meds: sodium chloride flush   Labs   Results for orders placed or performed during the hospital encounter of 01/27/21 (from the past 48 hour(s))  Heparin level (unfractionated)     Status: Abnormal   Collection Time: 01/28/21 10:04 AM  Result Value Ref Range   Heparin Unfractionated 0.25 (L) 0.30 - 0.70 IU/mL    Comment: (NOTE) The clinical reportable range upper limit is being lowered to >1.10 to align with the FDA approved guidance for the current laboratory assay.  If heparin results are below expected values, and patient dosage has  been confirmed, suggest follow up testing of antithrombin III levels. Performed at [facility], [place] [address], [place], [place] [address]   Basic  metabolic panel     Status: Abnormal   Collection Time: 01/28/21 10:20 PM  Result Value Ref Range   Sodium 132 (L) 135 - 145 mmol/L   Potassium 3.7 3.5 - 5.1 mmol/L   Chloride 94 (L) 98 - 111 mmol/L   CO2 27 22 - 32 mmol/L   Glucose, Bld 141 (H) 70 - 99 mg/dL    Comment: Glucose reference range applies only to samples taken after fasting for at least 8 hours.   BUN 30 (H) 6 - 20 mg/dL   Creatinine, Ser 1.37 (H) 0.61 - 1.24 mg/dL   Calcium 8.2 (L) 8.9 - 10.3 mg/dL   GFR, Estimated >60 >60 mL/min    Comment: (NOTE) Calculated using the CKD-EPI Creatinine Equation  (2021)    Anion gap 11 5 - 15    Comment: Performed at [facility] [address], [place], [place] [address]  CBC     Status: Abnormal   Collection Time: 01/29/21  1:04 AM  Result Value Ref Range   WBC 23.9 (H) 4.0 - 10.5 K/uL   RBC 5.24 4.22 - 5.81 MIL/uL   Hemoglobin 10.2 (L) 13.0 - 17.0 g/dL   HCT 35.5 (L) 39.0 - 52.0 %   MCV 67.7 (L) 80.0 - 100.0 fL   MCH 19.5 (L) 26.0 - 34.0 pg   MCHC 28.7 (L) 30.0 - 36.0 g/dL   RDW 23.1 (H) 11.5 - 15.5 %   Platelets 167 150 - 400 K/uL    Comment: REPEATED TO VERIFY   nRBC 0.2 0.0 - 0.2 %    Comment: Performed at [facility], [place] [address] [place], [place] [address]  Magnesium     Status: None   Collection Time: 01/29/21  1:04 AM  Result Value Ref Range   Magnesium 1.9 1.7 - 2.4 mg/dL    Comment: Performed at [facility] [address], [place], [place] [address]  Basic metabolic panel     Status: Abnormal   Collection Time: 01/29/21  2:30 PM  Result Value Ref Range   Sodium 133 (L) 135 - 145 mmol/L   Potassium 3.1 (L) 3.5 - 5.1 mmol/L   Chloride 91 (L) 98 - 111 mmol/L   CO2 32 22 - 32 mmol/L   Glucose, Bld 172 (H) 70 - 99 mg/dL    Comment: Glucose reference range applies only to samples taken after fasting for at least 8 hours.   BUN 33 (H) 6 - 20 mg/dL   Creatinine, Ser 1.32 (H) 0.61 - 1.24 mg/dL   Calcium 8.3 (L) 8.9 - 10.3 mg/dL   GFR, Estimated >60 >60 mL/min    Comment: (NOTE) Calculated using the CKD-EPI Creatinine Equation (2021)    Anion gap 10 5 - 15    Comment: Performed at [facility] [address], [place], [place] [address]  .Cooxemetry Panel (carboxy, met, total hgb, O2 sat)     Status: Abnormal   Collection Time: 01/30/21  1:44 AM  Result Value Ref Range   Total hemoglobin 10.3 (L) 12.0 - 16.0 g/dL   O2 Saturation 38.7 %   Carboxyhemoglobin 1.1 0.5 - 1.5 %   Methemoglobin 0.9 0.0 - 1.5 %    Comment: Performed at [facility], [place] [address], [place], [place] [address]   APTT     Status: Abnormal   Collection Time: 01/30/21  4:56 AM  Result Value Ref Range   aPTT 146 (H) 24 -  36 seconds    Comment:        IF BASELINE aPTT IS ELEVATED, SUGGEST PATIENT RISK ASSESSMENT BE USED TO DETERMINE APPROPRIATE ANTICOAGULANT THERAPY. Performed at [facility], [place] [address], [place], [place] [address]   Comprehensive metabolic panel     Status: Abnormal   Collection Time: 01/30/21  4:56 AM  Result Value Ref Range   Sodium 133 (L) 135 - 145 mmol/L   Potassium 3.8 3.5 - 5.1 mmol/L    Comment: DELTA CHECK NOTED   Chloride 90 (L) 98 - 111 mmol/L   CO2 28 22 - 32 mmol/L   Glucose, Bld 113 (H) 70 - 99 mg/dL    Comment: Glucose reference range applies only to samples taken after fasting for at least 8 hours.   BUN 37 (H) 6 - 20 mg/dL   Creatinine, Ser 1.39 (H) 0.61 - 1.24 mg/dL   Calcium 8.3 (L) 8.9 - 10.3 mg/dL   Total Protein 5.9 (L) 6.5 - 8.1 g/dL   Albumin 2.9 (L) 3.5 - 5.0 g/dL   AST 46 (H) 15 - 41 U/L   ALT 37 0 - 44 U/L   Alkaline Phosphatase 92 38 - 126 U/L   Total Bilirubin 3.3 (H) 0.3 - 1.2 mg/dL   GFR, Estimated >60 >60 mL/min    Comment: (NOTE) Calculated using the CKD-EPI Creatinine Equation (2021)    Anion gap 15 5 - 15    Comment: Performed at [facility] [address] [place], [place] [address]  Magnesium     Status: None   Collection Time: 01/30/21  4:56 AM  Result Value Ref Range   Magnesium 2.3 1.7 - 2.4 mg/dL    Comment: Performed at [facility] [address], [place], [place] [address]  Lipid panel     Status: Abnormal   Collection Time: 01/30/21  4:56 AM  Result Value Ref Range   Cholesterol 97 0 - 200 mg/dL   Triglycerides 58 <150 mg/dL   HDL 11 (L) >40 mg/dL   Total CHOL/HDL Ratio 8.8 RATIO   VLDL 12 0 - 40 mg/dL   LDL Cholesterol 74 0 - 99 mg/dL    Comment:        Total Cholesterol/HDL:CHD Risk Coronary Heart Disease Risk Table                     Men   Women  1/2 Average Risk   3.4   3.3   Average Risk       5.0   4.4  2 X Average Risk   9.6   7.1  3 X Average Risk  23.4   11.0        Use the calculated Patient Ratio above and the CHD Risk Table to determine the patient's CHD Risk.        ATP III CLASSIFICATION (LDL):  <100     mg/dL   Optimal  100-129  mg/dL   Near or Above                    Optimal  130-159  mg/dL   Borderline  160-189  mg/dL   High  >190     mg/dL   Very High Performed at [facility] [address], [place],  [address]   CBC     Status: Abnormal   Collection Time: 01/30/21  4:56 AM  Result Value Ref Range  WBC 21.3 (H) 4.0 - 10.5 K/uL   RBC 5.29 4.22 - 5.81 MIL/uL   Hemoglobin 10.2 (L) 13.0 - 17.0 g/dL   HCT 36.6 (L) 39.0 - 52.0 %   MCV 69.2 (L) 80.0 - 100.0 fL   MCH 19.3 (L) 26.0 - 34.0 pg   MCHC 27.9 (L) 30.0 - 36.0 g/dL   RDW 23.4 (H) 11.5 - 15.5 %   Platelets 157 150 - 400 K/uL    Comment: REPEATED TO VERIFY   nRBC 0.7 (H) 0.0 - 0.2 %    Comment: Performed at [facility], [place] [address], [place], [place] [address]  Heparin level (unfractionated)     Status: Abnormal   Collection Time: 01/30/21  4:56 AM  Result Value Ref Range   Heparin Unfractionated >1.10 (H) 0.30 - 0.70 IU/mL    Comment: (NOTE) The clinical reportable range upper limit is being lowered to >1.10 to align with the FDA approved guidance for the current laboratory assay.  If heparin results are below expected values, and patient dosage has  been confirmed, suggest follow up testing of antithrombin III levels. Performed at [facility], [place] [address] [place], [place] [address]   Glucose, capillary     Status: Abnormal   Collection Time: 01/30/21  7:24 AM  Result Value Ref Range   Glucose-Capillary 106 (H) 70 - 99 mg/dL    Comment: Glucose reference range applies only to samples taken after fasting for at least 8 hours.    ECG   N/A  Telemetry   NSR with PVC's, longest NSVT is 3 beats - Personally Reviewed  Radiology     DG CHEST PORT 1 VIEW  Result Date: 01/29/2021 CLINICAL DATA:  Central line placement EXAM: PORTABLE CHEST 1 VIEW COMPARISON:  CTA chest dated 12/27/2020 FINDINGS: Left IJ venous catheter terminates in the mid SVC.  No pneumothorax. Masslike opacity in the right lower lung, possibly reflecting pneumonia, although continued attention on follow-up is suggested to document resolution. Small right pleural effusion. No frank interstitial edema. Cardiomegaly.  Postsurgical changes related to prior CABG. Median sternotomy. IMPRESSION: Left IJ venous catheter terminates in the mid SVC.  No pneumothorax. Stable masslike opacity in the right lower lung, possibly reflecting pneumonia, although continued attention on follow-up is suggested to document resolution. Electronically Signed   By: Julian Hy M.D.   On: 01/29/2021 20:12   ECHOCARDIOGRAM COMPLETE  Result Date: 01/28/2021    ECHOCARDIOGRAM REPORT   Patient Name:   LAMONTA CYPRESS Date of Exam: 01/28/2021 Medical Rec #:  [id number]       Height:       71.0 in Accession #:    [id number]      Weight:       179.2 lb Date of Birth:  [birth year]        BSA:          2.013 m Patient Age:    13 years        BP:           114/91 mmHg Patient Gender: M               HR:           95 bpm. Exam Location:  Inpatient Procedure: 2D Echo, Cardiac Doppler and Color Doppler Indications:    CHF  History:        Patient has no prior history of Echocardiogram examinations.  COPD.  Sonographer:    Jyl Heinz Referring Phys: [id number] OLADAPO ADEFESO IMPRESSIONS  1. Left ventricular ejection fraction, by estimation, is 35 to 40%. The left ventricle has moderately decreased function. The left ventricle demonstrates global hypokinesis. The left ventricular internal cavity size was mildly dilated. There is moderate  eccentric left ventricular hypertrophy. Left ventricular diastolic parameters are indeterminate. There is the interventricular septum is flattened in  systole and diastole, consistent with right ventricular pressure and volume overload.  2. Right ventricular systolic function is moderately reduced. The right ventricular size is normal.  3. Left atrial size was severely dilated.  4. Right atrial size was moderately dilated.  5. Eccentric posteriorly directed severe mitral regurgitation. Mitral valve leaflets thickened. Restricted anterior leaflet, posterior/anterior leaflets not fully coapting heavily calcified with calcified chordae. The mitral valve is abnormal. Severe mitral valve regurgitation.  6. Tricuspid valve regurgitation is moderate.  7. The aortic valve is normal in structure. Aortic valve regurgitation is mild. Comparison(s): No prior Echocardiogram. FINDINGS  Left Ventricle: Left ventricular ejection fraction, by estimation, is 35 to 40%. The left ventricle has moderately decreased function. The left ventricle demonstrates global hypokinesis. The left ventricular internal cavity size was mildly dilated. There is moderate eccentric left ventricular hypertrophy. The interventricular septum is flattened in systole and diastole, consistent with right ventricular pressure and volume overload. Left ventricular diastolic parameters are indeterminate. Right Ventricle: The right ventricular size is normal. No increase in right ventricular wall thickness. Right ventricular systolic function is moderately reduced. Left Atrium: Left atrial size was severely dilated. Right Atrium: Right atrial size was moderately dilated. Pericardium: Trivial pericardial effusion is present. Mitral Valve: Eccentric posteriorly directed severe mitral regurgitation. Mitral valve leaflets thickened. Restricted anterior leaflet, posterior/anterior leaflets not fully coapting heavily calcified with calcified chordae. The mitral valve is abnormal.  Severe mitral valve regurgitation. MV peak gradient, 16.8 mmHg. The mean mitral valve gradient is 8.0 mmHg. Tricuspid Valve: The tricuspid  valve is normal in structure. Tricuspid valve regurgitation is moderate. Aortic Valve: The aortic valve is normal in structure. Aortic valve regurgitation is mild. Aortic regurgitation PHT measures 536 msec. Aortic valve peak gradient measures 5.0 mmHg. Pulmonic Valve: The pulmonic valve was normal in structure. Pulmonic valve regurgitation is mild to moderate. Aorta: The aortic root and ascending aorta are structurally normal, with no evidence of dilitation. IAS/Shunts: No atrial level shunt detected by color flow Doppler.  LEFT VENTRICLE PLAX 2D LVIDd:         6.70 cm      Diastology LVIDs:         5.10 cm      LV e' medial:    3.37 cm/s LV PW:         1.30 cm      LV E/e' medial:  46.3 LV IVS:        1.30 cm      LV e' lateral:   9.79 cm/s LVOT diam:     2.00 cm      LV E/e' lateral: 15.9 LV SV:         41 LV SV Index:   20 LVOT Area:     3.14 cm  LV Volumes (MOD) LV vol d, MOD A2C: 237.0 ml LV vol d, MOD A4C: 250.0 ml LV vol s, MOD A2C: 131.0 ml LV vol s, MOD A4C: 145.0 ml LV SV MOD A2C:     106.0 ml LV SV MOD A4C:     250.0 ml LV SV  MOD BP:      105.2 ml RIGHT VENTRICLE            IVC RV Basal diam:  5.20 cm    IVC diam: 2.00 cm RV Mid diam:    4.10 cm RV S prime:     8.49 cm/s TAPSE (M-mode): 1.5 cm LEFT ATRIUM              Index        RIGHT ATRIUM           Index LA diam:        5.80 cm  2.88 cm/m   RA Area:     26.40 cm LA Vol (A2C):   122.0 ml 60.62 ml/m  RA Volume:   85.90 ml  42.68 ml/m LA Vol (A4C):   92.2 ml  45.81 ml/m LA Biplane Vol: 108.0 ml 53.66 ml/m  AORTIC VALVE AV Area (Vmax): 2.56 cm AV Vmax:        112.00 cm/s AV Peak Grad:   5.0 mmHg LVOT Vmax:      91.40 cm/s LVOT Vmean:     67.000 cm/s LVOT VTI:       0.129 m AI PHT:         536 msec  AORTA Ao Root diam: 2.90 cm Ao Asc diam:  2.80 cm MITRAL VALVE                TRICUSPID VALVE MV Area (PHT): 6.27 cm     TR Peak grad:   52.4 mmHg MV Area VTI:   1.06 cm     TR Vmax:        362.00 cm/s MV Peak grad:  16.8 mmHg MV Mean grad:  8.0  mmHg     SHUNTS MV Vmax:       2.05 m/s     Systemic VTI:  0.13 m MV Vmean:      139.0 cm/s   Systemic Diam: 2.00 cm MV Decel Time: 121 msec MR Peak grad: 91.9 mmHg MR Mean grad: 61.0 mmHg MR Vmax:      479.33 cm/s MR Vmean:     376.7 cm/s MV E velocity: 156.00 cm/s MV A velocity: 92.70 cm/s MV E/A ratio:  1.68 Mary Scientist, physiological signed by Phineas Inches Signature Date/Time: 01/28/2021/12:24:20 PM    Final    [place] EKG SITE RITE  Result Date: 01/29/2021 If Site Rite image not attached, placement could not be confirmed due to current cardiac rhythm.   Cardiac Studies   See echo above  Assessment   Principal Problem:   Acute systolic CHF (congestive heart failure) (HCC) Active Problems:   CAP (community acquired pneumonia)   Acute respiratory failure with hypoxia (HCC)   Pleural effusion on right   Leukocytosis   Microcytic anemia   Hypoalbuminemia   Elevated brain natriuretic peptide (BNP) level   Demand ischemia (HCC)   DVT (deep venous thrombosis) (HCC)   Cigarette smoker   COPD (chronic obstructive pulmonary disease) (HCC)   Asthma   Severe mitral regurgitation   Serum total bilirubin elevated   Paroxysmal VT   Plan   Chest pain/NSTEMI Mr. Parodi is describing symptoms concerning for unstable angina that occurred the night before admission.  He is noted to have troponin elevation which was mild and has declined slowly.  He is noted to have a prior bypass after review of his CT imaging.  This could represent bypass graft ischemia.  We will plan to transition  him off of Xarelto to heparin in order to allow cardiac catheterization.  He needs to be on therapeutic doses given recent DVT in September.  We will also begin aspirin 81 mg daily.  Hold on beta-blocker at this point as he is in decompensated heart failure but will be treated with amiodarone.  He is having nonsustained VT which will hopefully quiet down with the amiodarone.  We will check a fasting lipid profile but given  his young age I am highly suspicious of familial hyperlipidemia -start appropriate statin therapy. Coronary artery disease/probable CABG Although he is a poor historian, it appears Mr. Raffel had prior [place] around [place].  He does have a midline sternotomy scar and pointed out that vein was taken from his leg.  No records are currently available.  We will try to obtain those.  Based on elevated troponin and a decline in LVEF, he will need a repeat heart catheterization this week.  Probably right and left.  He is currently on Xarelto which we will hold to allow that.  Transition to heparin and medical therapy for chest pain/NSTEMI as above. Acute systolic congestive heart failure/cardiogenic shock LVEF is newly declined to 35 to 40% with severe mitral regurgitation, probably moderate tricuspid regurgitation, mild to moderate aortic insufficiency as well.  He will need further imaging of the valves with TEE likely at some point for work-up of his MR.  I am concerned about low output given a reduced LVEF and significant mitral regurgitation.  His lactate is significantly elevated along with a leukocytosis.  Procalcitonin is low. Unable to place PICC- central line last night, COOX is 38% - plan to start milrinone 0.25 mcg/kg/min today.  Continued good diuresis with Lasix.  We will continue 40 mg IV twice daily.  He is 8.3L negative overall - Will ask advanced HF team to see. Recent DVT Hold Xarelto and transition to heparin per pharmacy to allow cardiac catheterization this week.  CRITICAL CARE TIME: I have spent a total of 45 minutes with patient reviewing hospital notes, telemetry, EKGs, labs and examining the patient as well as establishing an assessment and plan that was discussed with the patient.  > 50% of time was spent in direct patient care. The patient is critically ill with multi-organ system failure and requires high complexity decision making for assessment and support, frequent  evaluation and titration of therapies, application of advanced monitoring technologies and extensive interpretation of multiple databases.   Length of Stay:  LOS: 3 days   Pixie Casino, MD, [facility], Accident Director of the [facility]  [facility] Diplomate of the American Board of Clinical Lipidology Attending Cardiologist  Direct Dial: [phone]  Fax: [phone]  Website:  [email or web address] 01/30/2021, 8:05 AM

## 2021-01-30 NOTE — Consult Note (Signed)
Advanced Heart Failure Team Consult Note   Primary Physician: Pcp, No PCP-Cardiologist:  None  Reason for Consultation: CHF  HPI:    Mark Garrett is seen today for evaluation of CHF at the request of Dr. Debara Pickett.   40 y.o. with history of CAD s/p CABG in [place] in 2006, ischemic cardiomyopathy with severe mitral regurgitation, active smoking, recent DVT, and traumatic amputation of left foot.  Patient had crush injury to left foot in 2004, lost the foot.  Has a prosthesis.  He had CABG in 2006 (said "they were unable to do a stent").  From CTA this admission, looks like he has LIMA and a vein graft.   Patient appears to have been lost to medical followup until 10/22, was not on any meds.  He was then seen at [facility] in 10/22 with bilateral DVTs and started on Xarelto.  Echo there was reported as showing EF 55-60% with inferior hypokinesis and severe MR.  He was then admitted here on 11/10 with chest pain, leg swelling, cough, hypoxemia.  Lactate was 3.8 initially. COVID and influenza negative.  CTA chest showed no PE but was concerning for RML PNA.  He was started on ceftriaxone/azithromycin, Solumedrol, and IV Lasix. BNP 1733, HS-TnI mildly elevated without trend (99>108>105>91).  [place] of legs showed bilateral DVTs, age indeterminant.  IV Lasix was continued, IV amiodarone was added for NSVT.  I reviewed the echo done here, it showed EF 35-40%, severe hypokinesis inferior and inferolateral walls, moderate LV dilation with mild LVH, mild RV dilation with moderately decreased systolic function, restricted posterior mitral leaflet and calcified mitral valve with severe mitral regurgitation and at least mild mitral stenosis (mean gradient 8 mmHg), PASP 60.   CVL was placed, co-ox 39%.  Creatinine 1.34 today.  He was started on milrinone 0.25 this morning. Today, he says that he feels "tired." No chest pain or dyspnea currently.   Review of Systems: All systems reviewed and negative  except as per HPI.   Home Medications Prior to Admission medications   Medication Sig Start Date End Date Taking? Authorizing Provider  XARELTO 20 MG TABS tablet Take 20 mg by mouth every evening. 12/15/20  Yes [provider]   Past Medical History:  1. Bilateral DVTs: 10/22.  2. CAD: s/p CABG in [place] in 2006 3. Crush injury with loss of left foot 2004.  4. Ischemic cardiomyopathy:  - Echo (10/22, [place]): EF 55-60%, basal inferior/IS HK.  - Echo (11/22): EF 35-40%, severe hypokinesis inferior and inferolateral walls, moderate LV dilation with mild LVH, mild RV dilation with moderately decreased systolic function, restricted posterior mitral leaflet and calcified mitral valve with severe mitral regurgitation and at least mild mitral stenosis (mean gradient 8 mmHg), PASP 60.  5. Mitral regurgitation: Suspect primarily ischemic MR with restricted posterior leaflet, but the mitral valve is also significantly calcified.  6. Active smoker  Past Surgical History: Past Surgical History:  Procedure Laterality Date   CORONARY ARTERY BYPASS GRAFT     HERNIA REPAIR      Family History: Family History  Problem Relation Age of Onset   CAD Mother    CAD Father     Social History: Social History   Socioeconomic History   Marital status: Unknown    Spouse name: Not on file   Number of children: Not on file   Years of education: Not on file   Highest education level: Not on file  Occupational History  Not on file  Tobacco Use   Smoking status: Every Day    Types: Cigarettes   Smokeless tobacco: Never  Vaping Use   Vaping Use: Never used  Substance and Sexual Activity   Alcohol use: Not on file   Drug use: Not on file   Sexual activity: Yes  Other Topics Concern   Not on file  Social History Narrative   Not on file   Social Determinants of Health   Financial Resource Strain: Not on file  Food Insecurity: Not on file  Transportation Needs: Not on file   Physical Activity: Not on file  Stress: Not on file  Social Connections: Not on file    Allergies:  Allergies  Allergen Reactions   Iodide Rash    Objective:    Vital Signs:   Temp:  [97.6 F (36.4 C)-98 F (36.7 C)] 97.7 F (36.5 C) (11/13 0835) Pulse Rate:  [88-96] 91 (11/13 0835) Resp:  [18-20] 18 (11/13 0835) BP: (116-140)/(85-105) 116/98 (11/13 0835) SpO2:  [93 %-97 %] 97 % (11/13 0835) Weight:  [82 kg] 82 kg (11/13 0608) Last BM Date: 01/29/21  Weight change: Filed Weights   01/28/21 0523 01/29/21 0500 01/30/21 0608  Weight: 81.3 kg 82.3 kg 82 kg    Intake/Output:   Intake/Output Summary (Last 24 hours) at 01/30/2021 0931 Last data filed at 01/30/2021 9381 Gross per 24 hour  Intake 1237.57 ml  Output 2325 ml  Net -1087.43 ml      Physical Exam    General:  Well appearing. No resp difficulty HEENT: normal Neck: supple. JVP 14+. Carotids 2+ bilat; no bruits. No lymphadenopathy or thyromegaly appreciated. Cor: PMI nondisplaced. Regular rate & rhythm. No rubs, gallops.  3/6 HSM apex.  Lungs: clear Abdomen: soft, nontender, nondistended. No hepatosplenomegaly. No bruits or masses. Good bowel sounds. Extremities: no cyanosis, clubbing, rash, 1+ edema to knees. S/p left foot amputation.  Neuro: alert & orientedx3, cranial nerves grossly intact. moves all 4 extremities w/o difficulty. Affect pleasant   Telemetry   NSR 70s (personally reviewed)  EKG    Sinus tachy 100, RVH, LPFB, poor RWP, old inferior MI (personally reviewed)  Labs   Basic Metabolic Panel: Recent Labs  Lab 01/27/21 1032 01/27/21 2259 01/28/21 2220 01/29/21 0104 01/29/21 1430 01/30/21 0456  NA 133* 135 132*  --  133* 133*  K 3.5 3.2* 3.7  --  3.1* 3.8  CL 99 96* 94*  --  91* 90*  CO2 22 26 27   --  32 28  GLUCOSE 119* 150* 141*  --  172* 113*  BUN 23* 26* 30*  --  33* 37*  CREATININE 1.39* 1.30* 1.37*  --  1.32* 1.39*  CALCIUM 8.2* 8.3* 8.2*  --  8.3* 8.3*  MG  --   --    --  1.9  --  2.3    Liver Function Tests: Recent Labs  Lab 01/27/21 1032 01/27/21 2259 01/30/21 0456  AST 35 41 46*  ALT 28 28 37  ALKPHOS 101 95 92  BILITOT 6.3* 6.6* 3.3*  PROT 5.5* 5.8* 5.9*  ALBUMIN 2.7* 2.9* 2.9*   No results for input(s): LIPASE, AMYLASE in the last 168 hours. No results for input(s): AMMONIA in the last 168 hours.  CBC: Recent Labs  Lab 01/27/21 1032 01/27/21 2259 01/29/21 0104 01/30/21 0456  WBC 12.6* 5.6 23.9* 21.3*  HGB 10.1* 10.9* 10.2* 10.2*  HCT 36.9* 38.7* 35.5* 36.6*  MCV 70.3* 67.7* 67.7* 69.2*  PLT  195 182 167 157    Cardiac Enzymes: No results for input(s): CKTOTAL, CKMB, CKMBINDEX, TROPONINI in the last 168 hours.  BNP: BNP (last 3 results) Recent Labs    01/27/21 1034  BNP 1,733.0*    ProBNP (last 3 results) No results for input(s): PROBNP in the last 8760 hours.   CBG: Recent Labs  Lab 01/30/21 0724  GLUCAP 106*    Coagulation Studies: Recent Labs    01/27/21 1536  LABPROT 26.7*  INR 2.5*     Imaging   DG CHEST PORT 1 VIEW  Result Date: 01/29/2021 CLINICAL DATA:  Central line placement EXAM: PORTABLE CHEST 1 VIEW COMPARISON:  CTA chest dated 12/27/2020 FINDINGS: Left IJ venous catheter terminates in the mid SVC.  No pneumothorax. Masslike opacity in the right lower lung, possibly reflecting pneumonia, although continued attention on follow-up is suggested to document resolution. Small right pleural effusion. No frank interstitial edema. Cardiomegaly.  Postsurgical changes related to prior CABG. Median sternotomy. IMPRESSION: Left IJ venous catheter terminates in the mid SVC.  No pneumothorax. Stable masslike opacity in the right lower lung, possibly reflecting pneumonia, although continued attention on follow-up is suggested to document resolution. Electronically Signed   By: Julian Hy M.D.   On: 01/29/2021 20:12   [place] EKG SITE RITE  Result Date: 01/29/2021 If Site Rite image not attached, placement  could not be confirmed due to current cardiac rhythm.    Medications:     Current Medications:  amoxicillin-clavulanate  1 tablet Oral Q12H   aspirin  81 mg Oral Daily   Chlorhexidine Gluconate Cloth  6 each Topical Daily   dextromethorphan-guaiFENesin  1 tablet Oral BID   feeding supplement  237 mL Oral BID BM   furosemide  80 mg Intravenous Q12H   losartan  12.5 mg Oral BID   multivitamin with minerals  1 tablet Oral Daily   nicotine  21 mg Transdermal QHS   pantoprazole  40 mg Oral Daily   sodium chloride flush  10-40 mL Intracatheter Q12H   sodium chloride flush  3 mL Intravenous Q12H   spironolactone  12.5 mg Oral Daily    Infusions:  amiodarone 30 mg/hr (01/30/21 0708)   heparin 1,450 Units/hr (01/29/21 2118)   milrinone 0.25 mcg/kg/min (01/30/21 0830)     Assessment/Plan   1. Acute systolic CHF: Ischemic cardiomyopathy, symptoms worsened by severe MR.  I reviewed the echo done today, showing EF 35-40%, severe hypokinesis inferior and inferolateral walls, moderate LV dilation with mild LVH, mild RV dilation with moderately decreased systolic function, restricted posterior mitral leaflet and calcified mitral valve with severe mitral regurgitation and at least mild mitral stenosis (mean gradient 8 mmHg), PASP 60. Per report, echo at [facility] showed EF 55-60% with severe MR in 10/22.  It was a technically difficult study per report.  It is likely that he had a new ACS event prior to admission with reported drop in EF though HS-TnI with mild elevation and no trend suggests that it was not immediately prior to admission.  Low output noted with co-ox 39% this morning, lactate elevated when he was initially admitted.  Currently, BP is stable.  He is volume overloaded on exam.  - Agree with milrinone 0.25, repeat co-ox in pm and recheck lactate.  - Lasix 80 mg IV bid.  - Add spironolactone 12.5 daily.  - Add losartan 12.5 bid for afterload reduction, if BP/creatinine remain stable,  can transition to [employer].  - Will need right/left heart cath,  will aim for tomorrow. Discussed risks/benefits with patient and he agrees to procedure.  2. CAD: S/p CABG 2006.  Review of CT appears to show LIMA and 1 SVG.  As above, with fall in EF and CHF exacerbation, possible ACS prior to admission.  However, mild HS-TnI elevation with no trend suggests that ACS was not immediately prior to admission.  Current elevation is likely demand ischemia from volume overload.  - Continue heparin gtt while off Xarelto.  - ASA 81 - Crestor 40 - Cath tomorrow as above.  3.  Mitral valve disease: I reviewed echo.  The posterior MV leaflet is restricted with severe MR, suspect primarily infarct-related MR given LV dilation and inferior/inferolateral severe hypokinesis.  However, the MV is also thickened and there is at least mild mitral stenosis (mean gradient 8 likely overestimates given severe MR with high flow).   - He will need a TEE eventually, would diurese and afterload reduce first.  - Unlikely to be a good Mitraclip candidate with degree of MS.  4. 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  ?due to sedentary lifestyle + ?genetic predisposition.   - Was on Xarelto PTA, now heparin gtt.  Restart Xarelto prior to discharge.  5. Smoking: I strongly encouraged him to quit.  6. PNA: WBCs 21 but afebrile.  Culture NGTD.  RML PNA on CT chest.  - Currently on Augmentin.  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  - PT.   Length of Stay: 3  Loralie Champagne, MD  01/30/2021, 9:31 AM  Advanced Heart Failure Team Pager [phone] (M-F; 7a - 5p)  Please contact [facility] for night-coverage after hours (4p -7a ) and weekends on [email or web address]

## 2021-01-30 NOTE — Assessment & Plan Note (Addendum)
--   H&P documented SPO2 87%, resolved off oxygen, suspect secondary to CHF more so than pneumonia.

## 2021-01-30 NOTE — Assessment & Plan Note (Addendum)
--   No wheezing on exam today, good air movement, appears stable, apparently not on bronchodilators -- Recommend smoking cessation

## 2021-01-30 NOTE — Progress Notes (Signed)
ANTICOAGULATION CONSULT NOTE  Pharmacy Consult to transition back to heparin Indication: pre-existing DVT  Allergies  Allergen Reactions   Iodide Rash    Patient Measurements: Height: 5\' 11"  (180.3 cm) Weight: 82 kg (180 lb 12.4 oz) IBW/kg (Calculated) : 75.3  Heparin Dosing Weight: 72.6 kg  Vital Signs: Temp: 97.7 F (36.5 C) (11/13 0835) Temp Source: Oral (11/13 0835) BP: 116/98 (11/13 0835) Pulse Rate: 91 (11/13 0934)  Labs: Recent Labs    01/27/21 1032 01/27/21 1245 01/27/21 1536 01/27/21 1742 01/27/21 2259 01/28/21 1004 01/28/21 2220 01/29/21 0104 01/29/21 1430 01/30/21 0456 01/30/21 0906  HGB  --   --   --   --  10.9*  --   --  10.2*  --  10.2*  --   HCT  --   --   --   --  38.7*  --   --  35.5*  --  36.6*  --   PLT  --   --   --   --  182  --   --  167  --  157  --   APTT  --   --   --   --   --   --   --   --   --  146* 136*  LABPROT  --   --  26.7*  --   --   --   --   --   --   --   --   INR  --   --  2.5*  --   --   --   --   --   --   --   --   HEPARINUNFRC   < >  --   --   --  0.29* 0.25*  --   --   --  >1.10* >1.10*  CREATININE  --   --   --   --  1.30*  --  1.37*  --  1.32* 1.39*  --   TROPONINIHS  --  108*  --  105* 91*  --   --   --   --   --   --    < > = values in this interval not displayed.     Estimated Creatinine Clearance: 73.7 mL/min (A) (by C-G formula based on SCr of 1.39 mg/dL (H)).   Medications:  Medications Prior to Admission  Medication Sig Dispense Refill Last Dose   XARELTO 20 MG TABS tablet Take 20 mg by mouth every evening.   01/25/2021 at 1900   Infusions:   amiodarone 30 mg/hr (01/30/21 0708)   ferumoxytol     heparin 1,450 Units/hr (01/29/21 2118)   milrinone 0.25 mcg/kg/min (01/30/21 0830)     Assessment: 40 y.o. male with medical history significant for asthma/COPD, recent DVT, tobacco abuse who presented with 3 days SOB and CP. CT showed no evidence of pulmonary embolism.. Bilateral lower extremity ultrasound  reconfirmed known age indeterminate DVT. Pharmacy consulted to initiate heparin infusion.   Patient was previously prescribed Xarelto to treat DVT but has been out of the medication. He was started on heparin on admit then later transitioned to apixaban yesterday. Cardiology is recommending cardiac cath this week so was transitioned to IV heparin 11/12 (last apixaban dose was am 11/12). Will need to follow appt's until they correlate with heparin levels given recent apixaban use.   aPTT this AM 146 and supratherapeutic. Discussed with RN and found that AM lab had been drawn from same  access as heparin IV site. Repeat aPTT now 136 and still supratherapeutic. Confirmed with RN and patient that lab was drawn at different site (R arm) than where IV heparin currently running. RN confirmed no signs/symptoms of bleeding. Hgb today is stable at 10.2 but down from 10.9 at admit 3 days ago. Platelets remain stable. Given supratherapeutic aPTT will plan to hold heparin x 1 hour and restart at decreased rate (-2 units/kg/h)  Goal of Therapy:  Aptt goal 66-102s Heparin level 0.3-0.7 units/ml Monitor platelets by anticoagulation protocol: Yes   Plan:  Stop IV heparin gtt 1450 units/h x 1 hour Restart IV heparin gtt at 1300 units/h 6 hour heparin level check from restart Continue to monitor daily cbc, heparin level, and aptt Follow up timing of cardiac cath  Thank you for involving pharmacy in this patient's care.  Arnette Felts, PharmD PGY1 Ambulatory Care Pharmacy Resident 01/30/2021 10:31 AM  **Pharmacist phone directory can be found on [email or web address] listed under [facility]**

## 2021-01-30 NOTE — Assessment & Plan Note (Signed)
Management per cardiology. 

## 2021-01-30 NOTE — Assessment & Plan Note (Addendum)
--  Isolated, etiology unclear, reports occasional alcohol use only --Trending down, probably secondary to acute CHF, check CMP in a.m.

## 2021-01-30 NOTE — Progress Notes (Signed)
Progress Note Mark Garrett   EQA:[id number]  DOB: 04-28-1980  DOA: 01/27/2021     3 Date of Service: 01/30/2021   Clinical Course 40 year old man PMH including COPD, DVT, ran out of anticoagulation several days prior, presented with throbbing chest pain with radiation to left arm and shoulder, hypotensive in the field and given fluids.  Noted to be hypoxic. --11/10 bilateral lower extremity Doppler showed indeterminate DVT.  Admitted for acute hypoxic respiratory failure, pneumonia, COPD exacerbation, lactic acidosis, chest pain rule out with elevated troponin -- 11/11 echocardiogram revealed systolic dysfunction, global hypokinesis, unclear whether new or old, started on diuresis -- 11/12 patient reports chest pain resolved, was throbbing left shoulder left arm and left jaw, neck prior to admission with associated shortness of breath.  Multiple episodes of ectopy and NSVT.  Seen by cardiology.  Transferred to cardiac unit, started on heparin, amiodarone.  Central line placed, unable to place PICC. --11/13 feels okay today.  Seen by cardiology and advanced heart failure.  Started on milrinone.  Assessment and Plan * Acute systolic CHF (congestive heart failure) (HCC) -- No previous information in epic or Care Everywhere, may be new diagnosis, LVEF 35-40% with severe inferior and inferolateral wall hypokinesis -- With volume overload, low output.  Secondary to ischemic cardiomyopathy, complicated by severe mitral regurgitation.  Seen by advanced heart failure, started on milrinone, high-dose Lasix increased, spironolactone and losartan added --Plan for right and left heart cath tomorrow  Acute respiratory failure with hypoxia (HCC) -- H&P documents SPO2 87%, resolved off oxygen, suspect secondary to CHF, pneumonia.  Cardiogenic shock (HCC) -- Low output systolic CHF complicated by severe mitral regurgitation.  Milrinone and further management per cardiology.  NSTEMI (non-ST elevated  myocardial infarction) (HCC) -- Asymptomatic.  Continues on heparin, aspirin per cardiology.  Started on ARB and spironolactone.  Likely prior to admission given relatively low troponin.  CAP (community acquired pneumonia) -- Appears asymptomatic at this point, continue antibiotic  Paroxysmal VT -- PVCs and ectopy 9, with 138 beat run and shorter runs as well -- Magnesium below goal, will replace, check potassium and supplement   Severe mitral regurgitation -- Management per cardiology  COPD (chronic obstructive pulmonary disease) (HCC) -- No wheezing on exam, good air movement, appears stable, apparently not on bronchodilators -- Recommend smoking cessation  DVT (deep venous thrombosis) (HCC) -- Bilateral lower extremity DVT.  Suspect chronic by history.  Imaging, CT did not show large embolus, will continue anticoagulation  Serum total bilirubin elevated --Isolated, etiology unclear, reports occasional alcohol use only --Trending down, probably secondary to acute CHF.  Subjective:  Feels ok today  Objective Vitals:   01/30/21 0047 01/30/21 0608 01/30/21 0835 01/30/21 0934  BP: (!) 129/105 (!) 140/103 (!) 116/98   Pulse:  88 91 91  Resp: 19 18 18 18   Temp: 98 F (36.7 C) 97.6 F (36.4 C) 97.7 F (36.5 C)   TempSrc: Oral Oral Oral   SpO2: 94% 94% 97% 94%  Weight:  82 kg    Height:       82 kg  Vital signs were reviewed and unremarkable.  Exam Physical Exam Constitutional:      General: He is not in acute distress.    Appearance: He is ill-appearing. He is not toxic-appearing.  Cardiovascular:     Rate and Rhythm: Normal rate and regular rhythm.     Heart sounds: No murmur heard. Pulmonary:     Effort: Pulmonary effort is normal. No respiratory distress.  Breath sounds: No wheezing, rhonchi or rales.  Neurological:     Mental Status: He is alert.  Psychiatric:        Mood and Affect: Mood normal.        Behavior: Behavior normal.    Labs / Other  Information My review of labs, imaging, notes and other tests is significant for    creatinine stable 1.39, potassium within normal limits, total bilirubin decreasing 3.3, AST modestly elevated.  LDL 74.  WBC slightly down 21.3.  Disposition Plan: Status is: Inpatient  Remains inpatient appropriate because: Low output heart failure, cardiogenic shock.  Heparin No family present  Time spent: 35 minutes [facility] 01/30/2021, 11:56 AM

## 2021-01-30 NOTE — Assessment & Plan Note (Addendum)
--   Appears appears resolved clinically.  Would complete total 5 days antibiotics.

## 2021-01-30 NOTE — Assessment & Plan Note (Addendum)
--   New diagnosis, LVEF 35-40% with severe inferior and inferolateral wall hypokinesis -- With continued volume overload.  Secondary to ischemic cardiomyopathy, complicated by severe mitral regurgitation.  Management per advanced heart failure, continued on milrinone, high-dose Lasix, spironolactone and losartan  --Plan for right and left heart cath today

## 2021-01-30 NOTE — Assessment & Plan Note (Signed)
--   Bilateral lower extremity DVT.  Suspect chronic by history.  Imaging, CT did not show large embolus, will continue anticoagulation

## 2021-01-30 NOTE — Assessment & Plan Note (Addendum)
--   PVCs and ectopy now much better controlled on amiodarone -- Potassium at goal.  Magnesium at goal yesterday.

## 2021-01-30 NOTE — Progress Notes (Signed)
ANTICOAGULATION CONSULT NOTE  Pharmacy Consult to transition back to heparin Indication: pre-existing DVT  Allergies  Allergen Reactions   Iodide Rash    Patient Measurements: Height: 5\' 11"  (180.3 cm) Weight: 82 kg (180 lb 12.4 oz) IBW/kg (Calculated) : 75.3  Heparin Dosing Weight: 72.6 kg  Vital Signs: Temp: 97.6 F (36.4 C) (11/13 1555) Temp Source: Oral (11/13 1555) BP: 110/97 (11/13 1716) Pulse Rate: 73 (11/13 1716)  Labs: Recent Labs    01/27/21 2259 01/28/21 1004 01/28/21 2220 01/29/21 0104 01/29/21 1430 01/30/21 0456 01/30/21 0906 01/30/21 1442 01/30/21 1750  HGB 10.9*  --   --  10.2*  --  10.2*  --   --   --   HCT 38.7*  --   --  35.5*  --  36.6*  --   --   --   PLT 182  --   --  167  --  157  --   --   --   APTT  --   --   --   --   --  146* 136*  --  119*  HEPARINUNFRC 0.29* 0.25*  --   --   --  >1.10* >1.10*  --   --   CREATININE 1.30*  --    < >  --  1.32* 1.39*  --  1.18  --   TROPONINIHS 91*  --   --   --   --   --   --   --   --    < > = values in this interval not displayed.     Estimated Creatinine Clearance: 86.9 mL/min (by C-G formula based on SCr of 1.18 mg/dL).   Medications:  Medications Prior to Admission  Medication Sig Dispense Refill Last Dose   XARELTO 20 MG TABS tablet Take 20 mg by mouth every evening.   01/25/2021 at 1900   Infusions:   amiodarone 60 mg/hr (01/30/21 1730)   ferumoxytol     heparin 1,300 Units/hr (01/30/21 1449)   magnesium sulfate bolus IVPB     milrinone 0.25 mcg/kg/min (01/30/21 0830)     Assessment: 40 y.o. male with medical history significant for asthma/COPD, recent DVT, tobacco abuse who presented with 3 days SOB and CP. CT showed no evidence of pulmonary embolism.. 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 Pharmacy consulted to initiate heparin infusion.    Aptt still high this evening after rate adjustment earlier at 119s. No bleeding issues noted.   Goal of  Therapy:  Aptt goal 66-102s Heparin level 0.3-0.7 units/ml Monitor platelets by anticoagulation protocol: Yes   Plan:  Stop IV heparin gtt x 1 hour Restart IV heparin gtt at 1050 units/h 6 hour heparin level check from restart Continue to monitor daily cbc, heparin level, and aptt Follow up timing of cardiac cath  Thank you for involving pharmacy in this patient's care.  45 PharmD., BCPS Clinical Pharmacist 01/30/2021 7:46 PM  **Pharmacist phone directory can be found on [email or web address] listed under [facility]**

## 2021-01-30 NOTE — Assessment & Plan Note (Addendum)
--   Remains asymptomatic, no chest pain.  Continues heparin, aspirin per cardiology.  ARB and spironolactone.  Likely prior to admission given relatively low troponin. -- Plan for left and right heart cath today.

## 2021-01-30 NOTE — Progress Notes (Signed)
Pt having frequent PVCs and multiple runs of Vtach; most notable were 15 & 24 beats respectively. Cardiology and Heart Failure team aware. Milrinone, Amiodarone, Heparin, Lasix and Magnesium infusions adjusted accordingly. Pt AxOx4, VS wnL, a little drowsy otherwise asymptomatic. Diuresing very well with an output of over 3L in 5 hours. 02 saturation 96% on RA. No complaints other than noise in the hospital.

## 2021-01-31 ENCOUNTER — Other Ambulatory Visit (HOSPITAL_COMMUNITY): Payer: Self-pay

## 2021-01-31 ENCOUNTER — Encounter (HOSPITAL_COMMUNITY)
Admission: EM | Disposition: A | Discharge status: Home Health | Payer: Self-pay | Source: Home / Self Care | Attending: Cardiology

## 2021-01-31 DIAGNOSIS — I2510 Atherosclerotic heart disease of native coronary artery without angina pectoris: Secondary | ICD-10-CM

## 2021-01-31 DIAGNOSIS — I25810 Atherosclerosis of coronary artery bypass graft(s) without angina pectoris: Secondary | ICD-10-CM

## 2021-01-31 DIAGNOSIS — R079 Chest pain, unspecified: Secondary | ICD-10-CM

## 2021-01-31 HISTORY — PX: RIGHT/LEFT HEART CATH AND CORONARY/GRAFT ANGIOGRAPHY: CATH118267

## 2021-01-31 LAB — COOXEMETRY PANEL
Carboxyhemoglobin: 1.2 % (ref 0.5–1.5)
Carboxyhemoglobin: 1.6 % — ABNORMAL HIGH (ref 0.5–1.5)
Carboxyhemoglobin: 1.6 % — ABNORMAL HIGH (ref 0.5–1.5)
Carboxyhemoglobin: 1.8 % — ABNORMAL HIGH (ref 0.5–1.5)
Methemoglobin: 1.3 % (ref 0.0–1.5)
Methemoglobin: 1.2 % (ref 0.0–1.5)
Methemoglobin: 1.2 % (ref 0.0–1.5)
Methemoglobin: 1 % (ref 0.0–1.5)
O2 Saturation: 36.4 %
O2 Saturation: 42.3 %
O2 Saturation: 46.8 %
O2 Saturation: 52.7 %
Total hemoglobin: 9.3 g/dL — ABNORMAL LOW (ref 12.0–16.0)
Total hemoglobin: 9.4 g/dL — ABNORMAL LOW (ref 12.0–16.0)
Total hemoglobin: 8.9 g/dL — ABNORMAL LOW (ref 12.0–16.0)
Total hemoglobin: 9.5 g/dL — ABNORMAL LOW (ref 12.0–16.0)

## 2021-01-31 LAB — BASIC METABOLIC PANEL
Anion gap: 8 (ref 5–15)
Anion gap: 12 (ref 5–15)
Anion gap: 10 (ref 5–15)
BUN: 38 mg/dL — ABNORMAL HIGH (ref 6–20)
BUN: 36 mg/dL — ABNORMAL HIGH (ref 6–20)
BUN: 31 mg/dL — ABNORMAL HIGH (ref 6–20)
CO2: 33 mmol/L — ABNORMAL HIGH (ref 22–32)
CO2: 31 mmol/L (ref 22–32)
CO2: 29 mmol/L (ref 22–32)
Calcium: 8.1 mg/dL — ABNORMAL LOW (ref 8.9–10.3)
Calcium: 8.2 mg/dL — ABNORMAL LOW (ref 8.9–10.3)
Calcium: 7.9 mg/dL — ABNORMAL LOW (ref 8.9–10.3)
Chloride: 91 mmol/L — ABNORMAL LOW (ref 98–111)
Chloride: 90 mmol/L — ABNORMAL LOW (ref 98–111)
Chloride: 94 mmol/L — ABNORMAL LOW (ref 98–111)
Creatinine, Ser: 1.39 mg/dL — ABNORMAL HIGH (ref 0.61–1.24)
Creatinine, Ser: 1.36 mg/dL — ABNORMAL HIGH (ref 0.61–1.24)
Creatinine, Ser: 1.15 mg/dL (ref 0.61–1.24)
GFR, Estimated: >60 mL/min (ref >60)
GFR, Estimated: >60 mL/min (ref >60)
GFR, Estimated: >60 mL/min (ref >60)
Glucose, Bld: 95 mg/dL (ref 70–99)
Glucose, Bld: 89 mg/dL (ref 70–99)
Glucose, Bld: 115 mg/dL — ABNORMAL HIGH (ref 70–99)
Potassium: 4 mmol/L (ref 3.5–5.1)
Potassium: 4 mmol/L (ref 3.5–5.1)
Potassium: 3.9 mmol/L (ref 3.5–5.1)
Sodium: 132 mmol/L — ABNORMAL LOW (ref 135–145)
Sodium: 133 mmol/L — ABNORMAL LOW (ref 135–145)
Sodium: 133 mmol/L — ABNORMAL LOW (ref 135–145)

## 2021-01-31 LAB — POCT I-STAT EG7
Acid-Base Excess: 8 mmol/L — ABNORMAL HIGH (ref 0.0–2.0)
Acid-Base Excess: 9 mmol/L — ABNORMAL HIGH (ref 0.0–2.0)
Acid-Base Excess: 9 mmol/L — ABNORMAL HIGH (ref 0.0–2.0)
Bicarbonate: 34.1 mmol/L — ABNORMAL HIGH (ref 20.0–28.0)
Bicarbonate: 34.1 mmol/L — ABNORMAL HIGH (ref 20.0–28.0)
Bicarbonate: 34.7 mmol/L — ABNORMAL HIGH (ref 20.0–28.0)
Calcium, Ion: 1.04 mmol/L — ABNORMAL LOW (ref 1.15–1.40)
Calcium, Ion: 1.04 mmol/L — ABNORMAL LOW (ref 1.15–1.40)
Calcium, Ion: 1.06 mmol/L — ABNORMAL LOW (ref 1.15–1.40)
HCT: 34 % — ABNORMAL LOW (ref 39.0–52.0)
HCT: 36 % — ABNORMAL LOW (ref 39.0–52.0)
HCT: 36 % — ABNORMAL LOW (ref 39.0–52.0)
Hemoglobin: 11.6 g/dL — ABNORMAL LOW (ref 13.0–17.0)
Hemoglobin: 12.2 g/dL — ABNORMAL LOW (ref 13.0–17.0)
Hemoglobin: 12.2 g/dL — ABNORMAL LOW (ref 13.0–17.0)
O2 Saturation: 57 %
O2 Saturation: 58 %
O2 Saturation: 75 %
Potassium: 4.1 mmol/L (ref 3.5–5.1)
Potassium: 4.2 mmol/L (ref 3.5–5.1)
Potassium: 4.3 mmol/L (ref 3.5–5.1)
Sodium: 133 mmol/L — ABNORMAL LOW (ref 135–145)
Sodium: 133 mmol/L — ABNORMAL LOW (ref 135–145)
Sodium: 134 mmol/L — ABNORMAL LOW (ref 135–145)
TCO2: 36 mmol/L — ABNORMAL HIGH (ref 22–32)
TCO2: 36 mmol/L — ABNORMAL HIGH (ref 22–32)
TCO2: 36 mmol/L — ABNORMAL HIGH (ref 22–32)
pCO2, Ven: 49.1 mmHg (ref 44.0–60.0)
pCO2, Ven: 49.2 mmHg (ref 44.0–60.0)
pCO2, Ven: 52.5 mmHg (ref 44.0–60.0)
pH, Ven: 7.42 (ref 7.250–7.430)
pH, Ven: 7.45 — ABNORMAL HIGH (ref 7.250–7.430)
pH, Ven: 7.456 — ABNORMAL HIGH (ref 7.250–7.430)
pO2, Ven: 29 mmHg — CL (ref 32.0–45.0)
pO2, Ven: 29 mmHg — CL (ref 32.0–45.0)
pO2, Ven: 41 mmHg (ref 32.0–45.0)

## 2021-01-31 LAB — CBC
HCT: 33.8 % — ABNORMAL LOW (ref 39.0–52.0)
HCT: 32.3 % — ABNORMAL LOW (ref 39.0–52.0)
Hemoglobin: 9.4 g/dL — ABNORMAL LOW (ref 13.0–17.0)
Hemoglobin: 9.2 g/dL — ABNORMAL LOW (ref 13.0–17.0)
MCH: 19 pg — ABNORMAL LOW (ref 26.0–34.0)
MCH: 19.5 pg — ABNORMAL LOW (ref 26.0–34.0)
MCHC: 27.8 g/dL — ABNORMAL LOW (ref 30.0–36.0)
MCHC: 28.5 g/dL — ABNORMAL LOW (ref 30.0–36.0)
MCV: 68.4 fL — ABNORMAL LOW (ref 80.0–100.0)
MCV: 68.6 fL — ABNORMAL LOW (ref 80.0–100.0)
Platelets: 151 10*3/uL (ref 150–400)
Platelets: 150 10*3/uL (ref 150–400)
RBC: 4.94 MIL/uL (ref 4.22–5.81)
RBC: 4.71 MIL/uL (ref 4.22–5.81)
RDW: 23.3 % — ABNORMAL HIGH (ref 11.5–15.5)
RDW: 23.1 % — ABNORMAL HIGH (ref 11.5–15.5)
WBC: 13 10*3/uL — ABNORMAL HIGH (ref 4.0–10.5)
WBC: 8.8 10*3/uL (ref 4.0–10.5)
nRBC: 0.8 % — ABNORMAL HIGH (ref 0.0–0.2)
nRBC: 0.9 % — ABNORMAL HIGH (ref 0.0–0.2)

## 2021-01-31 LAB — POCT ACTIVATED CLOTTING TIME: Activated Clotting Time: 173 seconds

## 2021-01-31 LAB — APTT: aPTT: 53 seconds — ABNORMAL HIGH (ref 24–36)

## 2021-01-31 LAB — HEPARIN LEVEL (UNFRACTIONATED): Heparin Unfractionated: 0.85 IU/mL — ABNORMAL HIGH (ref 0.30–0.70)

## 2021-01-31 LAB — MRSA NEXT GEN BY PCR, NASAL: MRSA by PCR Next Gen: NOT DETECTED

## 2021-01-31 SURGERY — RIGHT/LEFT HEART CATH AND CORONARY/GRAFT ANGIOGRAPHY
Anesthesia: LOCAL

## 2021-01-31 MED ORDER — LABETALOL HCL 5 MG/ML IV SOLN: 10.0000 mg | INTRAVENOUS | Status: AC | PRN

## 2021-01-31 MED ORDER — HEPARIN SODIUM (PORCINE) 1000 UNIT/ML IJ SOLN
INTRAMUSCULAR | Status: AC
  Filled 2021-01-31: qty 1

## 2021-01-31 MED ORDER — IOHEXOL 350 MG/ML SOLN
INTRAVENOUS | Status: DC | PRN
  Administered 2021-01-31: 75 mL

## 2021-01-31 MED ORDER — MIDAZOLAM HCL 2 MG/2ML IJ SOLN
INTRAMUSCULAR | Status: DC | PRN
  Administered 2021-01-31: .5 mg via INTRAVENOUS

## 2021-01-31 MED ORDER — LIDOCAINE HCL (PF) 1 % IJ SOLN
INTRAMUSCULAR | Status: AC
  Filled 2021-01-31: qty 5

## 2021-01-31 MED ORDER — FENTANYL CITRATE (PF) 100 MCG/2ML IJ SOLN
INTRAMUSCULAR | Status: AC
  Filled 2021-01-31: qty 2

## 2021-01-31 MED ORDER — FUROSEMIDE 10 MG/ML IJ SOLN
80.0000 mg | Freq: Once | INTRAMUSCULAR | Status: AC
  Administered 2021-01-31: 80 mg via INTRAVENOUS
  Filled 2021-01-31: qty 8

## 2021-01-31 MED ORDER — LIVING BETTER WITH HEART FAILURE BOOK: Freq: Once | Status: DC

## 2021-01-31 MED ORDER — HEPARIN (PORCINE) IN NACL 1000-0.9 UT/500ML-% IV SOLN
INTRAVENOUS | Status: AC
  Filled 2021-01-31: qty 1000

## 2021-01-31 MED ORDER — NOREPINEPHRINE 4 MG/250ML-% IV SOLN
3.0000 ug/min | INTRAVENOUS | Status: DC
  Administered 2021-01-31: 19:00:00 3 ug/min via INTRAVENOUS
  Filled 2021-01-31: qty 250

## 2021-01-31 MED ORDER — ACETAMINOPHEN 325 MG PO TABS: 650.0000 mg | ORAL_TABLET | ORAL | Status: DC | PRN

## 2021-01-31 MED ORDER — CHLORHEXIDINE GLUCONATE CLOTH 2 % EX PADS
6.0000 | MEDICATED_PAD | Freq: Every day | CUTANEOUS | Status: DC
  Administered 2021-01-31 – 2021-02-16 (×17): 6 via TOPICAL

## 2021-01-31 MED ORDER — HEPARIN (PORCINE) 25000 UT/250ML-% IV SOLN
1300.0000 [IU]/h | INTRAVENOUS | Status: DC
  Administered 2021-01-31 – 2021-02-05 (×5): 1200 [IU]/h via INTRAVENOUS
  Administered 2021-02-07: 1300 [IU]/h via INTRAVENOUS
  Filled 2021-01-31 (×9): qty 250

## 2021-01-31 MED ORDER — HYDRALAZINE HCL 20 MG/ML IJ SOLN: 10.0000 mg | INTRAMUSCULAR | Status: AC | PRN

## 2021-01-31 MED ORDER — HEPARIN SODIUM (PORCINE) 1000 UNIT/ML IJ SOLN
INTRAMUSCULAR | Status: DC | PRN
  Administered 2021-01-31: 4000 [IU] via INTRAVENOUS

## 2021-01-31 MED ORDER — POTASSIUM CHLORIDE CRYS ER 20 MEQ PO TBCR
40.0000 meq | EXTENDED_RELEASE_TABLET | Freq: Two times a day (BID) | ORAL | Status: AC
  Administered 2021-01-31 (×2): 40 meq via ORAL
  Filled 2021-01-31 (×2): qty 2

## 2021-01-31 MED ORDER — SODIUM CHLORIDE 0.9% FLUSH
3.0000 mL | Freq: Two times a day (BID) | INTRAVENOUS | Status: DC
  Administered 2021-01-31 – 2021-02-15 (×13): 3 mL via INTRAVENOUS

## 2021-01-31 MED ORDER — FENTANYL CITRATE (PF) 100 MCG/2ML IJ SOLN
INTRAMUSCULAR | Status: DC | PRN
  Administered 2021-01-31: 12.5 ug via INTRAVENOUS

## 2021-01-31 MED ORDER — VERAPAMIL HCL 2.5 MG/ML IV SOLN
INTRAVENOUS | Status: AC
  Filled 2021-01-31: qty 2

## 2021-01-31 MED ORDER — "THROMBI-PAD 3""X3"" EX PADS"
1.0000 | MEDICATED_PAD | Freq: Once | CUTANEOUS | Status: DC
  Filled 2021-01-31 (×2): qty 1

## 2021-01-31 MED ORDER — VERAPAMIL HCL 2.5 MG/ML IV SOLN
INTRAVENOUS | Status: DC | PRN
  Administered 2021-01-31: 10 mL via INTRA_ARTERIAL

## 2021-01-31 MED ORDER — MIDAZOLAM HCL 2 MG/2ML IJ SOLN
INTRAMUSCULAR | Status: AC
  Filled 2021-01-31: qty 2

## 2021-01-31 MED ORDER — FUROSEMIDE 10 MG/ML IJ SOLN
10.0000 mg/h | INTRAMUSCULAR | Status: DC
  Administered 2021-01-31 – 2021-02-01 (×2): 10 mg/h via INTRAVENOUS
  Filled 2021-01-31 (×4): qty 20

## 2021-01-31 MED ORDER — SODIUM CHLORIDE 0.9% FLUSH: 3.0000 mL | INTRAVENOUS | Status: DC | PRN

## 2021-01-31 MED ORDER — LIDOCAINE HCL (PF) 1 % IJ SOLN
INTRAMUSCULAR | Status: AC
  Filled 2021-01-31: qty 30

## 2021-01-31 MED ORDER — AMIODARONE HCL IN DEXTROSE 360-4.14 MG/200ML-% IV SOLN
30.0000 mg/h | INTRAVENOUS | Status: DC
  Administered 2021-01-31 – 2021-02-08 (×14): 30 mg/h via INTRAVENOUS
  Filled 2021-01-31 (×17): qty 200

## 2021-01-31 MED ORDER — ONDANSETRON HCL 4 MG/2ML IJ SOLN: 4.0000 mg | Freq: Four times a day (QID) | INTRAMUSCULAR | Status: DC | PRN

## 2021-01-31 MED ORDER — SODIUM CHLORIDE 0.9 % IV SOLN: INTRAVENOUS | Status: DC | PRN

## 2021-01-31 MED ORDER — METOLAZONE 2.5 MG PO TABS
2.5000 mg | ORAL_TABLET | Freq: Once | ORAL | Status: DC
  Filled 2021-01-31: qty 1

## 2021-01-31 MED ORDER — HEPARIN (PORCINE) IN NACL 1000-0.9 UT/500ML-% IV SOLN
INTRAVENOUS | Status: DC | PRN
  Administered 2021-01-31 (×2): 500 mL

## 2021-01-31 MED ORDER — SODIUM CHLORIDE 0.9 % IV SOLN: 250.0000 mL | INTRAVENOUS | Status: DC | PRN

## 2021-01-31 MED ORDER — LIDOCAINE HCL (PF) 1 % IJ SOLN
INTRAMUSCULAR | Status: DC | PRN
  Administered 2021-01-31: 10 mL

## 2021-01-31 SURGICAL SUPPLY — 15 items
CATH BALLN WEDGE 5F 110CM (CATHETERS) ×2 IMPLANT
CATH INFINITI 5FR MULTPACK ANG (CATHETERS) ×2 IMPLANT
CATH SWAN GANZ 7F STRAIGHT (CATHETERS) ×2 IMPLANT
DEVICE RAD COMP TR BAND LRG (VASCULAR PRODUCTS) ×2 IMPLANT
GLIDESHEATH SLEND SS 6F .021 (SHEATH) ×2 IMPLANT
GUIDEWIRE .025 260CM (WIRE) ×2 IMPLANT
GUIDEWIRE INQWIRE 1.5J.035X260 (WIRE) ×1 IMPLANT
INQWIRE 1.5J .035X260CM (WIRE) ×2
KIT HEART LEFT (KITS) ×2 IMPLANT
PACK CARDIAC CATHETERIZATION (CUSTOM PROCEDURE TRAY) ×2 IMPLANT
SHEATH GLIDE SLENDER 4/5FR (SHEATH) ×2 IMPLANT
SHEATH PINNACLE 7F 10CM (SHEATH) ×2 IMPLANT
SHEATH PROBE COVER 6X72 (BAG) ×2 IMPLANT
TRANSDUCER W/STOPCOCK (MISCELLANEOUS) ×2 IMPLANT
WIRE EMERALD 3MM-J .025X260CM (WIRE) ×2 IMPLANT

## 2021-01-31 NOTE — Progress Notes (Addendum)
3:35 PM Was CTB by RN regarding hypotension. SBP 70s. Rechecked and confirmed manually. Examined pt at bedside. He is completely asymptomatic. A&Ox3, conversant. No dizziness. In supine position, post R/LHC. RHC via Rt femoral vein. LHC via Lt radial. Both sites stable, no signs of bleeding. Will check STAT CBC and BMP.   D/w Dr. Shirlee Latch. Suspect hypotension 2/2 sedation used during cath. Has low BP at baseline. Per Dr. Shirlee Latch, ok to continue w/ milrinone 0.375 mcg/kg/min. Will discontinue losartan and spironolactone.   RN and patient updated on plan. Will monitor closely.   Addendum 5:21 PM   Remains hypotensive, SBP 80s. Repeat Co-ox 46% despite milrinone. SCr 1.15, CO2 29. Hgb 12.2>>9.2. Groin reassessed, soft. No hematoma. Palpable DP rt foot but toes cool to touch. Reviewed labs and d/w Dr. Shirlee Latch. Will transfer to CCU for addition of NE. Start at 3 mcg. Will ask RT to place arterial line. Repeat Co-ox tonight. Follow CBC.   D/w 6E bedside nurse and 2H charge RN. Pt updated. He is asleep. Drowsy but arousable. No complaints. No respiratory distress.   Robbie Lis, PA-C 01/31/2021

## 2021-01-31 NOTE — Assessment & Plan Note (Signed)
Recommend cessation. ?

## 2021-01-31 NOTE — Progress Notes (Signed)
Milrinone stopped and due to SBP below 80. Paged CHF on call.

## 2021-01-31 NOTE — H&P (View-Only) (Signed)
Patient ID: Mark Garrett, male   DOB: 02/12/1981, 40 y.o.   MRN: [id number]     Advanced Heart Failure Rounding Note  PCP-Cardiologist: None   Subjective:    Looks more comfortable this morning.  He is on milrinone 0.25 with excellent diuresis overnight on IV Lasix.  Co-ox still low at 42%. Weight down 4 lbs.  CVP remains > 20.   On amiodarone gtt with NSVT, no further overnight.    Objective:   Weight Range: 79.9 kg Body mass index is 24.57 kg/m.   Vital Signs:   Temp:  [97.2 F (36.2 C)-98 F (36.7 C)] 97.7 F (36.5 C) (11/14 0749) Pulse Rate:  [71-91] 71 (11/14 0749) Resp:  [13-18] 15 (11/14 0749) BP: (87-118)/(56-97) 87/56 (11/14 0749) SpO2:  [94 %-100 %] 96 % (11/14 0749) Weight:  [79.9 kg] 79.9 kg (11/14 0433) Last BM Date: 01/30/21  Weight change: Filed Weights   01/29/21 0500 01/30/21 0608 01/31/21 0433  Weight: 82.3 kg 82 kg 79.9 kg    Intake/Output:   Intake/Output Summary (Last 24 hours) at 01/31/2021 0850 Last data filed at 01/31/2021 0700 Gross per 24 hour  Intake 1494.45 ml  Output 6790 ml  Net -5295.55 ml      Physical Exam    General:  Well appearing. No resp difficulty HEENT: Normal Neck: Supple. JVP 16+. Carotids 2+ bilat; no bruits. No lymphadenopathy or thyromegaly appreciated. Cor: PMI nondisplaced. Regular rate & rhythm. No rubs, gallops.  3/6 HSM apex.  Lungs: Clear Abdomen: Soft, nontender, nondistended. No hepatosplenomegaly. No bruits or masses. Good bowel sounds. Extremities: No cyanosis, clubbing, rash.  1+ edema to knees with left foot amputation.  Neuro: Alert & orientedx3, cranial nerves grossly intact. moves all 4 extremities w/o difficulty. Affect pleasant   Telemetry   NSR, no further NSVT overnight. Personally reviewed  Labs    CBC Recent Labs    01/30/21 0456 01/31/21 0620  WBC 21.3* 13.0*  HGB 10.2* 9.4*  HCT 36.6* 33.8*  MCV 69.2* 68.4*  PLT 157 151   Basic Metabolic Panel Recent Labs     01/30/21 0456 01/30/21 1442  NA 133* 135  K 3.8 2.9*  CL 90* 88*  CO2 28 33*  GLUCOSE 113* 119*  BUN 37* 37*  CREATININE 1.39* 1.18  CALCIUM 8.3* 8.1*  MG 2.3 2.6*   Liver Function Tests Recent Labs    01/30/21 0456  AST 46*  ALT 37  ALKPHOS 92  BILITOT 3.3*  PROT 5.9*  ALBUMIN 2.9*   No results for input(s): LIPASE, AMYLASE in the last 72 hours. Cardiac Enzymes No results for input(s): CKTOTAL, CKMB, CKMBINDEX, TROPONINI in the last 72 hours.  BNP: BNP (last 3 results) Recent Labs    01/27/21 1034  BNP 1,733.0*    ProBNP (last 3 results) No results for input(s): PROBNP in the last 8760 hours.   D-Dimer No results for input(s): DDIMER in the last 72 hours. Hemoglobin A1C No results for input(s): HGBA1C in the last 72 hours. Fasting Lipid Panel Recent Labs    01/30/21 0456  CHOL 97  HDL 11*  LDLCALC 74  TRIG 58  CHOLHDL 8.8   Thyroid Function Tests No results for input(s): TSH, T4TOTAL, T3FREE, THYROIDAB in the last 72 hours.  Invalid input(s): FREET3  Other results:   Imaging    No results found.   Medications:     Scheduled Medications:  amoxicillin-clavulanate  1 tablet Oral Q12H   aspirin  81 mg Oral  Daily   Chlorhexidine Gluconate Cloth  6 each Topical Daily   dextromethorphan-guaiFENesin  1 tablet Oral BID   feeding supplement  237 mL Oral BID BM   furosemide  80 mg Intravenous BID   Living Better with Heart Failure Book   Does not apply Once   losartan  12.5 mg Oral BID   multivitamin with minerals  1 tablet Oral Daily   nicotine  21 mg Transdermal QHS   pantoprazole  40 mg Oral Daily   rosuvastatin  40 mg Oral Daily   sodium chloride flush  10-40 mL Intracatheter Q12H   sodium chloride flush  3 mL Intravenous Q12H   spironolactone  12.5 mg Oral Daily    Infusions:  sodium chloride Stopped (01/30/21 2329)   sodium chloride 10 mL/hr at 01/31/21 0640   amiodarone 60 mg/hr (01/31/21 0629)   heparin 1,200 Units/hr  (01/31/21 0733)   magnesium sulfate bolus IVPB     milrinone 0.25 mcg/kg/min (01/31/21 0024)    PRN Medications: sodium chloride, sodium chloride flush, sodium chloride flush    Assessment/Plan   1. Acute systolic CHF: Ischemic cardiomyopathy, symptoms worsened by severe MR.  I reviewed the echo done today, showing EF 35-40%, severe hypokinesis inferior and inferolateral walls, moderate LV dilation with mild LVH, mild RV dilation with moderately decreased systolic function, restricted posterior mitral leaflet and calcified mitral valve with severe mitral regurgitation and at least mild mitral stenosis (mean gradient 8 mmHg), PASP 60. Per report, echo at [facility] showed EF 55-60% with severe MR in 10/22.  It was a technically difficult study per report.  It is likely that he had a new ACS event prior to admission with reported drop in EF though HS-TnI with mild elevation and no trend suggests that it was not immediately prior to admission.  Low output noted with co-ox 39% initially, lactate elevated when he was initially admitted.  Milrinone 0.25 started with good diuresis and weight down, but CVP still >20.  Co-ox low again this morning at 42%. - Increase milrinone to 0.375, repeat co-ox.   - Lasix 80 mg IV bid again today, good response so far.  - Continue spironolactone 12.5 daily. Still waiting for BMET this morning.  - Continue losartan 12.5 bid for afterload reduction, if BP/creatinine remain stable, can transition to Entresto.  - Will need right/left heart cath, planned for today. Discussed risks/benefits with patient and he agrees to procedure.  2. CAD: S/p CABG 2006.  Review of CT appears to show LIMA and 1 SVG.  As above, with fall in EF and CHF exacerbation, possible ACS prior to admission.  However, mild HS-TnI elevation with no trend suggests that ACS was not immediately prior to admission.  Current elevation is likely demand ischemia from volume overload.  - Continue heparin gtt while  off Xarelto.  - ASA 81 - Crestor 40 - Cath today as above.  3.  Mitral valve disease: I reviewed echo.  The posterior MV leaflet is restricted with severe MR, suspect primarily infarct-related MR given LV dilation and inferior/inferolateral severe hypokinesis.  However, the MV is also thickened and there is at least mild mitral stenosis (mean gradient 8 likely overestimates given severe MR with high flow).   - He will need a TEE eventually, would diurese and afterload reduce first.  - Unlikely to be a good Mitraclip candidate with degree of MS.  4. 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  ?due to sedentary lifestyle + ?genetic predisposition.   -   Was on Xarelto PTA, now heparin gtt.  Restart Xarelto prior to discharge.  5. Smoking: I strongly encouraged him to quit.  6. PNA: WBCs 21 => 13 but afebrile.  Culture NGTD.  RML PNA on CT chest.  - Currently on Augmentin.  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  - PT.   Need to mobilize.   Length of Stay: 4  Alvis Edgell, MD  01/31/2021, 8:50 AM  Advanced Heart Failure Team Pager [phone] (M-F; 7a - 5p)  Please contact [facility] for night-coverage after hours (5p -7a ) and weekends on [email or web address]   

## 2021-01-31 NOTE — Progress Notes (Signed)
CSW received consult for resources for assist with wood for patients wood burning stove. CSW met with patient at beside. Patient confirmed he needs assist with wood for his wood burning stove. CSW provided patient with resources for [place] community resource guide. Patient accepted. CSW provided patient with supervisor there name to call at [place] social services who can help assist patient. Patient thanked CSW. All questions answered. No further questions reported at this time. 

## 2021-01-31 NOTE — Progress Notes (Signed)
DAILY PROGRESS NOTE   Patient Name: Mark Garrett Date of Encounter: 01/31/2021 Cardiologist:  Iantha Fallen   Chief Complaint   No complaints this am BP low   Patient Profile   40 year old male with shortness of breath and weakness, history of asthma/COPD, now found to have acute systolic congestive heart failure.  Subjective   For cath today NSVT EF 35-40% moderate RV reduction severe MR   Objective   Vitals:   01/30/21 2331 01/31/21 0433 01/31/21 0437 01/31/21 0749  BP: 99/73 90/68 100/63 (!) 87/56  Pulse: 75 74 75 71  Resp: 15 13 16 15   Temp: (!) 97.2 F (36.2 C) 97.9 F (36.6 C) 97.9 F (36.6 C) 97.7 F (36.5 C)  TempSrc:  Oral Oral Oral  SpO2: 97% 100% 98% 96%  Weight:  79.9 kg    Height:        Intake/Output Summary (Last 24 hours) at 01/31/2021 0850 Last data filed at 01/31/2021 0700 Gross per 24 hour  Intake 1494.45 ml  Output 6790 ml  Net -5295.55 ml   Filed Weights   01/29/21 0500 01/30/21 0608 01/31/21 0433  Weight: 82.3 kg 82 kg 79.9 kg    Physical Exam   Affect appropriate Healthy:  appears stated age 37: left neck line  Neck supple with no adenopathy JVP normal no bruits no thyromegaly Lungs clear with no wheezing and good diaphragmatic motion Heart:  S1/S2 MR murmur, no rub, gallop or click PMI normal Abdomen: benighn, BS positve, no tenderness, no AAA no bruit.  No HSM or HJR Distal pulses intact with no bruits No edema Neuro non-focal Skin warm and dry No muscular weakness   Inpatient Medications    Scheduled Meds:  amoxicillin-clavulanate  1 tablet Oral Q12H   aspirin  81 mg Oral Daily   Chlorhexidine Gluconate Cloth  6 each Topical Daily   dextromethorphan-guaiFENesin  1 tablet Oral BID   feeding supplement  237 mL Oral BID BM   furosemide  80 mg Intravenous BID   Living Better with Heart Failure Book   Does not apply Once   losartan  12.5 mg Oral BID   multivitamin with minerals  1 tablet Oral Daily    nicotine  21 mg Transdermal QHS   pantoprazole  40 mg Oral Daily   rosuvastatin  40 mg Oral Daily   sodium chloride flush  10-40 mL Intracatheter Q12H   sodium chloride flush  3 mL Intravenous Q12H   spironolactone  12.5 mg Oral Daily    Continuous Infusions:  sodium chloride Stopped (01/30/21 2329)   sodium chloride 10 mL/hr at 01/31/21 0640   amiodarone 60 mg/hr (01/31/21 0629)   heparin 1,200 Units/hr (01/31/21 0733)   magnesium sulfate bolus IVPB     milrinone 0.25 mcg/kg/min (01/31/21 0024)    PRN Meds: sodium chloride, sodium chloride flush, sodium chloride flush   Labs   Results for orders placed or performed during the hospital encounter of 01/27/21 (from the past 48 hour(s))  Basic metabolic panel     Status: Abnormal   Collection Time: 01/29/21  2:30 PM  Result Value Ref Range   Sodium 133 (L) 135 - 145 mmol/L   Potassium 3.1 (L) 3.5 - 5.1 mmol/L   Chloride 91 (L) 98 - 111 mmol/L   CO2 32 22 - 32 mmol/L   Glucose, Bld 172 (H) 70 - 99 mg/dL    Comment: Glucose reference range applies only to samples taken after fasting for  at least 8 hours.   BUN 33 (H) 6 - 20 mg/dL   Creatinine, Ser 1.32 (H) 0.61 - 1.24 mg/dL   Calcium 8.3 (L) 8.9 - 10.3 mg/dL   GFR, Estimated >60 >60 mL/min    Comment: (NOTE) Calculated using the CKD-EPI Creatinine Equation (2021)    Anion gap 10 5 - 15    Comment: Performed at [facility] [address] [place], [place] [address]  .Cooxemetry Panel (carboxy, met, total hgb, O2 sat)     Status: Abnormal   Collection Time: 01/30/21  1:44 AM  Result Value Ref Range   Total hemoglobin 10.3 (L) 12.0 - 16.0 g/dL   O2 Saturation 38.7 %   Carboxyhemoglobin 1.1 0.5 - 1.5 %   Methemoglobin 0.9 0.0 - 1.5 %    Comment: Performed at [facility], [place] [address], [place], [place] [address]  APTT     Status: Abnormal   Collection Time: 01/30/21  4:56 AM  Result Value Ref Range   aPTT 146 (H) 24 - 36 seconds    Comment:        IF  BASELINE aPTT IS ELEVATED, SUGGEST PATIENT RISK ASSESSMENT BE USED TO DETERMINE APPROPRIATE ANTICOAGULANT THERAPY. Performed at [facility], [place] [address], [place], [place] [address]   Comprehensive metabolic panel     Status: Abnormal   Collection Time: 01/30/21  4:56 AM  Result Value Ref Range   Sodium 133 (L) 135 - 145 mmol/L   Potassium 3.8 3.5 - 5.1 mmol/L    Comment: DELTA CHECK NOTED   Chloride 90 (L) 98 - 111 mmol/L   CO2 28 22 - 32 mmol/L   Glucose, Bld 113 (H) 70 - 99 mg/dL    Comment: Glucose reference range applies only to samples taken after fasting for at least 8 hours.   BUN 37 (H) 6 - 20 mg/dL   Creatinine, Ser 1.39 (H) 0.61 - 1.24 mg/dL   Calcium 8.3 (L) 8.9 - 10.3 mg/dL   Total Protein 5.9 (L) 6.5 - 8.1 g/dL   Albumin 2.9 (L) 3.5 - 5.0 g/dL   AST 46 (H) 15 - 41 U/L   ALT 37 0 - 44 U/L   Alkaline Phosphatase 92 38 - 126 U/L   Total Bilirubin 3.3 (H) 0.3 - 1.2 mg/dL   GFR, Estimated >60 >60 mL/min    Comment: (NOTE) Calculated using the CKD-EPI Creatinine Equation (2021)    Anion gap 15 5 - 15    Comment: Performed at [facility] [address], [place], [place] [address]  Magnesium     Status: None   Collection Time: 01/30/21  4:56 AM  Result Value Ref Range   Magnesium 2.3 1.7 - 2.4 mg/dL    Comment: Performed at [facility] [address] [place], [place] [address]  Lipid panel     Status: Abnormal   Collection Time: 01/30/21  4:56 AM  Result Value Ref Range   Cholesterol 97 0 - 200 mg/dL   Triglycerides 58 <150 mg/dL   HDL 11 (L) >40 mg/dL   Total CHOL/HDL Ratio 8.8 RATIO   VLDL 12 0 - 40 mg/dL   LDL Cholesterol 74 0 - 99 mg/dL    Comment:        Total Cholesterol/HDL:CHD Risk Coronary Heart Disease Risk Table                     Men  Women  1/2 Average Risk   3.4   3.3  Average Risk       5.0   4.4  2 X Average Risk   9.6   7.1  3 X Average Risk  23.4   11.0        Use the calculated Patient Ratio above and the CHD  Risk Table to determine the patient's CHD Risk.        ATP III CLASSIFICATION (LDL):  <100     mg/dL   Optimal  100-129  mg/dL   Near or Above                    Optimal  130-159  mg/dL   Borderline  160-189  mg/dL   High  >190     mg/dL   Very High Performed at [facility] [address], [place], [place] [address]   CBC     Status: Abnormal   Collection Time: 01/30/21  4:56 AM  Result Value Ref Range   WBC 21.3 (H) 4.0 - 10.5 K/uL   RBC 5.29 4.22 - 5.81 MIL/uL   Hemoglobin 10.2 (L) 13.0 - 17.0 g/dL   HCT 36.6 (L) 39.0 - 52.0 %   MCV 69.2 (L) 80.0 - 100.0 fL   MCH 19.3 (L) 26.0 - 34.0 pg   MCHC 27.9 (L) 30.0 - 36.0 g/dL   RDW 23.4 (H) 11.5 - 15.5 %   Platelets 157 150 - 400 K/uL    Comment: REPEATED TO VERIFY   nRBC 0.7 (H) 0.0 - 0.2 %    Comment: Performed at [facility], [place] [address] [place], [place] [address]  Heparin level (unfractionated)     Status: Abnormal   Collection Time: 01/30/21  4:56 AM  Result Value Ref Range   Heparin Unfractionated >1.10 (H) 0.30 - 0.70 IU/mL    Comment: (NOTE) The clinical reportable range upper limit is being lowered to >1.10 to align with the FDA approved guidance for the current laboratory assay.  If heparin results are below expected values, and patient dosage has  been confirmed, suggest follow up testing of antithrombin III levels. Performed at [facility], [place] [address] [place], [place] [address]   Glucose, capillary     Status: Abnormal   Collection Time: 01/30/21  7:24 AM  Result Value Ref Range   Glucose-Capillary 106 (H) 70 - 99 mg/dL    Comment: Glucose reference range applies only to samples taken after fasting for at least 8 hours.  Heparin level (unfractionated)     Status: Abnormal   Collection Time: 01/30/21  9:06 AM  Result Value Ref Range   Heparin Unfractionated >1.10 (H) 0.30 - 0.70 IU/mL    Comment: (NOTE) The clinical reportable range upper limit is being lowered to >1.10 to align  with the FDA approved guidance for the current laboratory assay.  If heparin results are below expected values, and patient dosage has  been confirmed, suggest follow up testing of antithrombin III levels. Performed at [facility], [place] [address], [place], [place] [address]   APTT     Status: Abnormal   Collection Time: 01/30/21  9:06 AM  Result Value Ref Range   aPTT 136 (H) 24 - 36 seconds    Comment:        IF BASELINE aPTT IS ELEVATED, SUGGEST PATIENT RISK ASSESSMENT BE USED TO DETERMINE APPROPRIATE ANTICOAGULANT THERAPY. Performed at [facility], [place] [address] [place], [place] [address]   Cooxemetry Panel (carboxy, met, total hgb, O2 sat)     Status: Abnormal   Collection Time: 01/30/21  2:42 PM  Result Value Ref Range   Total hemoglobin 9.7 (L) 12.0 - 16.0 g/dL   O2 Saturation 70.3 %   Carboxyhemoglobin 1.7 (H) 0.5 - 1.5 %   Methemoglobin 0.9 0.0 - 1.5 %    Comment: Performed at [facility] [address], [place], [place] [address]  Lactic acid, plasma     Status: Abnormal   Collection Time: 01/30/21  2:42 PM  Result Value Ref Range   Lactic Acid, Venous 3.0 (HH) 0.5 - 1.9 mmol/L    Comment: CRITICAL VALUE NOTED.  VALUE IS CONSISTENT WITH PREVIOUSLY REPORTED AND CALLED VALUE. Performed at [facility], [place] [address] [place], [place] [address]   Basic metabolic panel     Status: Abnormal   Collection Time: 01/30/21  2:42 PM  Result Value Ref Range   Sodium 135 135 - 145 mmol/L   Potassium 2.9 (L) 3.5 - 5.1 mmol/L   Chloride 88 (L) 98 - 111 mmol/L   CO2 33 (H) 22 - 32 mmol/L   Glucose, Bld 119 (H) 70 - 99 mg/dL    Comment: Glucose reference range applies only to samples taken after fasting for at least 8 hours.   BUN 37 (H) 6 - 20 mg/dL   Creatinine, Ser 1.18 0.61 - 1.24 mg/dL   Calcium 8.1 (L) 8.9 - 10.3 mg/dL   GFR, Estimated >60 >60 mL/min    Comment: (NOTE) Calculated using the CKD-EPI Creatinine Equation (2021)    Anion gap  14 5 - 15    Comment: Performed at [facility] [address] [place], [place] [address]  Magnesium     Status: Abnormal   Collection Time: 01/30/21  2:42 PM  Result Value Ref Range   Magnesium 2.6 (H) 1.7 - 2.4 mg/dL    Comment: Performed at [facility] [address] [place], [place] [address]  APTT     Status: Abnormal   Collection Time: 01/30/21  5:50 PM  Result Value Ref Range   aPTT 119 (H) 24 - 36 seconds    Comment:        IF BASELINE aPTT IS ELEVATED, SUGGEST PATIENT RISK ASSESSMENT BE USED TO DETERMINE APPROPRIATE ANTICOAGULANT THERAPY. Performed at [facility], [place] [address] [place], [place] [address]   Lactic acid, plasma     Status: Abnormal   Collection Time: 01/30/21 11:01 PM  Result Value Ref Range   Lactic Acid, Venous 2.4 (HH) 0.5 - 1.9 mmol/L    Comment: CRITICAL VALUE NOTED.  VALUE IS CONSISTENT WITH PREVIOUSLY REPORTED AND CALLED VALUE. Performed at [facility], [place] [address], [place], [place] [address]   .Cooxemetry Panel (carboxy, met, total hgb, O2 sat)     Status: Abnormal   Collection Time: 01/31/21  5:18 AM  Result Value Ref Range   Total hemoglobin 9.3 (L) 12.0 - 16.0 g/dL   O2 Saturation 36.4 %   Carboxyhemoglobin 1.2 0.5 - 1.5 %   Methemoglobin 1.3 0.0 - 1.5 %    Comment: Performed at [facility] [address] [place], [place] [address]  .Cooxemetry Panel (carboxy, met, total hgb, O2 sat)     Status: Abnormal   Collection Time: 01/31/21  5:42 AM  Result Value Ref Range  Total hemoglobin 9.4 (L) 12.0 - 16.0 g/dL   O2 Saturation 42.3 %    Comment: THIS IS A RECOLLECTION.   Carboxyhemoglobin 1.6 (H) 0.5 - 1.5 %   Methemoglobin 1.2 0.0 - 1.5 %    Comment: Performed at [facility], [place] [address], [place], [place] [address]  CBC     Status: Abnormal   Collection Time: 01/31/21  6:20 AM  Result Value Ref Range   WBC 13.0 (H) 4.0 - 10.5 K/uL   RBC 4.94 4.22 - 5.81 MIL/uL   Hemoglobin 9.4 (L)  13.0 - 17.0 g/dL   HCT 33.8 (L) 39.0 - 52.0 %   MCV 68.4 (L) 80.0 - 100.0 fL   MCH 19.0 (L) 26.0 - 34.0 pg   MCHC 27.8 (L) 30.0 - 36.0 g/dL   RDW 23.3 (H) 11.5 - 15.5 %   Platelets 151 150 - 400 K/uL   nRBC 0.8 (H) 0.0 - 0.2 %    Comment: Performed at [facility], [place] [address] [place], [place] [address]  Heparin level (unfractionated)     Status: Abnormal   Collection Time: 01/31/21  6:20 AM  Result Value Ref Range   Heparin Unfractionated 0.85 (H) 0.30 - 0.70 IU/mL    Comment: (NOTE) The clinical reportable range upper limit is being lowered to >1.10 to align with the FDA approved guidance for the current laboratory assay.  If heparin results are below expected values, and patient dosage has  been confirmed, suggest follow up testing of antithrombin III levels. Performed at [facility], [place] [address] [place], [place] [address]   APTT     Status: Abnormal   Collection Time: 01/31/21  6:20 AM  Result Value Ref Range   aPTT 53 (H) 24 - 36 seconds    Comment:        IF BASELINE aPTT IS ELEVATED, SUGGEST PATIENT RISK ASSESSMENT BE USED TO DETERMINE APPROPRIATE ANTICOAGULANT THERAPY. Performed at [facility], [place] [address] [place], [place] [address]     ECG   N/A  Telemetry   NSR with PVC's, longest NSVT is 3 beats - Personally Reviewed  Radiology    DG CHEST PORT 1 VIEW  Result Date: 01/29/2021 CLINICAL DATA:  Central line placement EXAM: PORTABLE CHEST 1 VIEW COMPARISON:  CTA chest dated 12/27/2020 FINDINGS: Left IJ venous catheter terminates in the mid SVC.  No pneumothorax. Masslike opacity in the right lower lung, possibly reflecting pneumonia, although continued attention on follow-up is suggested to document resolution. Small right pleural effusion. No frank interstitial edema. Cardiomegaly.  Postsurgical changes related to prior CABG. Median sternotomy. IMPRESSION: Left IJ venous catheter terminates in the mid SVC.  No pneumothorax.  Stable masslike opacity in the right lower lung, possibly reflecting pneumonia, although continued attention on follow-up is suggested to document resolution. Electronically Signed   By: Julian Hy M.D.   On: 01/29/2021 20:12   [place] EKG SITE RITE  Result Date: 01/29/2021 If Site Rite image not attached, placement could not be confirmed due to current cardiac rhythm.   Cardiac Studies   See echo above  Assessment   Principal Problem:   Acute systolic CHF (congestive heart failure) (HCC) Active Problems:   CAP (community acquired pneumonia)   Acute respiratory failure with hypoxia (HCC)   Pleural effusion on right   Leukocytosis   Microcytic anemia   NSTEMI (non-ST elevated myocardial infarction) ([facility])   DVT (deep venous thrombosis) ([facility])  Cigarette smoker   COPD (chronic obstructive pulmonary disease) (HCC)   Asthma   Severe mitral regurgitation   Serum total bilirubin elevated   Paroxysmal VT   Cardiogenic shock ([facility])   Plan   Chest pain/NSTEMI Prior CABG in [place] 2006 likely ischemic DCM / MR for right and left cath today with Dr Aundra Dubin .on heparin troponin peak only 108  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 For right and left cath today on Milrinone Has been seen by advanced CHF team on milrinone Coox only 42% Hold ARB and diuretics prior to cath BP soft Cr ok K low yesterday supplemented pending this am  Recent DVT Xarelto held for cath on heparin    Jenkins Rouge 01/31/2021, 8:50 AM

## 2021-01-31 NOTE — Progress Notes (Signed)
Patient ID: Mark Garrett, male   DOB: 02/12/1981, 40 y.o.   MRN: [id number]     Advanced Heart Failure Rounding Note  PCP-Cardiologist: None   Subjective:    Looks more comfortable this morning.  He is on milrinone 0.25 with excellent diuresis overnight on IV Lasix.  Co-ox still low at 42%. Weight down 4 lbs.  CVP remains > 20.   On amiodarone gtt with NSVT, no further overnight.    Objective:   Weight Range: 79.9 kg Body mass index is 24.57 kg/m.   Vital Signs:   Temp:  [97.2 F (36.2 C)-98 F (36.7 C)] 97.7 F (36.5 C) (11/14 0749) Pulse Rate:  [71-91] 71 (11/14 0749) Resp:  [13-18] 15 (11/14 0749) BP: (87-118)/(56-97) 87/56 (11/14 0749) SpO2:  [94 %-100 %] 96 % (11/14 0749) Weight:  [79.9 kg] 79.9 kg (11/14 0433) Last BM Date: 01/30/21  Weight change: Filed Weights   01/29/21 0500 01/30/21 0608 01/31/21 0433  Weight: 82.3 kg 82 kg 79.9 kg    Intake/Output:   Intake/Output Summary (Last 24 hours) at 01/31/2021 0850 Last data filed at 01/31/2021 0700 Gross per 24 hour  Intake 1494.45 ml  Output 6790 ml  Net -5295.55 ml      Physical Exam    General:  Well appearing. No resp difficulty HEENT: Normal Neck: Supple. JVP 16+. Carotids 2+ bilat; no bruits. No lymphadenopathy or thyromegaly appreciated. Cor: PMI nondisplaced. Regular rate & rhythm. No rubs, gallops.  3/6 HSM apex.  Lungs: Clear Abdomen: Soft, nontender, nondistended. No hepatosplenomegaly. No bruits or masses. Good bowel sounds. Extremities: No cyanosis, clubbing, rash.  1+ edema to knees with left foot amputation.  Neuro: Alert & orientedx3, cranial nerves grossly intact. moves all 4 extremities w/o difficulty. Affect pleasant   Telemetry   NSR, no further NSVT overnight. Personally reviewed  Labs    CBC Recent Labs    01/30/21 0456 01/31/21 0620  WBC 21.3* 13.0*  HGB 10.2* 9.4*  HCT 36.6* 33.8*  MCV 69.2* 68.4*  PLT 157 151   Basic Metabolic Panel Recent Labs     01/30/21 0456 01/30/21 1442  NA 133* 135  K 3.8 2.9*  CL 90* 88*  CO2 28 33*  GLUCOSE 113* 119*  BUN 37* 37*  CREATININE 1.39* 1.18  CALCIUM 8.3* 8.1*  MG 2.3 2.6*   Liver Function Tests Recent Labs    01/30/21 0456  AST 46*  ALT 37  ALKPHOS 92  BILITOT 3.3*  PROT 5.9*  ALBUMIN 2.9*   No results for input(s): LIPASE, AMYLASE in the last 72 hours. Cardiac Enzymes No results for input(s): CKTOTAL, CKMB, CKMBINDEX, TROPONINI in the last 72 hours.  BNP: BNP (last 3 results) Recent Labs    01/27/21 1034  BNP 1,733.0*    ProBNP (last 3 results) No results for input(s): PROBNP in the last 8760 hours.   D-Dimer No results for input(s): DDIMER in the last 72 hours. Hemoglobin A1C No results for input(s): HGBA1C in the last 72 hours. Fasting Lipid Panel Recent Labs    01/30/21 0456  CHOL 97  HDL 11*  LDLCALC 74  TRIG 58  CHOLHDL 8.8   Thyroid Function Tests No results for input(s): TSH, T4TOTAL, T3FREE, THYROIDAB in the last 72 hours.  Invalid input(s): FREET3  Other results:   Imaging    No results found.   Medications:     Scheduled Medications:  amoxicillin-clavulanate  1 tablet Oral Q12H   aspirin  81 mg Oral  Daily   Chlorhexidine Gluconate Cloth  6 each Topical Daily   dextromethorphan-guaiFENesin  1 tablet Oral BID   feeding supplement  237 mL Oral BID BM   furosemide  80 mg Intravenous BID   Living Better with Heart Failure Book   Does not apply Once   losartan  12.5 mg Oral BID   multivitamin with minerals  1 tablet Oral Daily   nicotine  21 mg Transdermal QHS   pantoprazole  40 mg Oral Daily   rosuvastatin  40 mg Oral Daily   sodium chloride flush  10-40 mL Intracatheter Q12H   sodium chloride flush  3 mL Intravenous Q12H   spironolactone  12.5 mg Oral Daily    Infusions:  sodium chloride Stopped (01/30/21 2329)   sodium chloride 10 mL/hr at 01/31/21 0640   amiodarone 60 mg/hr (01/31/21 0629)   heparin 1,200 Units/hr  (01/31/21 0733)   magnesium sulfate bolus IVPB     milrinone 0.25 mcg/kg/min (01/31/21 0024)    PRN Medications: sodium chloride, sodium chloride flush, sodium chloride flush    Assessment/Plan   1. Acute systolic CHF: Ischemic cardiomyopathy, symptoms worsened by severe MR.  I reviewed the echo done today, showing EF 35-40%, severe hypokinesis inferior and inferolateral walls, moderate LV dilation with mild LVH, mild RV dilation with moderately decreased systolic function, restricted posterior mitral leaflet and calcified mitral valve with severe mitral regurgitation and at least mild mitral stenosis (mean gradient 8 mmHg), PASP 60. Per report, echo at [facility] showed EF 55-60% with severe MR in 10/22.  It was a technically difficult study per report.  It is likely that he had a new ACS event prior to admission with reported drop in EF though HS-TnI with mild elevation and no trend suggests that it was not immediately prior to admission.  Low output noted with co-ox 39% initially, lactate elevated when he was initially admitted.  Milrinone 0.25 started with good diuresis and weight down, but CVP still >20.  Co-ox low again this morning at 42%. - Increase milrinone to 0.375, repeat co-ox.   - Lasix 80 mg IV bid again today, good response so far.  - Continue spironolactone 12.5 daily. Still waiting for BMET this morning.  - Continue losartan 12.5 bid for afterload reduction, if BP/creatinine remain stable, can transition to [employer].  - Will need right/left heart cath, planned for today. Discussed risks/benefits with patient and he agrees to procedure.  2. CAD: S/p CABG 2006.  Review of CT appears to show LIMA and 1 SVG.  As above, with fall in EF and CHF exacerbation, possible ACS prior to admission.  However, mild HS-TnI elevation with no trend suggests that ACS was not immediately prior to admission.  Current elevation is likely demand ischemia from volume overload.  - Continue heparin gtt while  off Xarelto.  - ASA 81 - Crestor 40 - Cath today as above.  3.  Mitral valve disease: I reviewed echo.  The posterior MV leaflet is restricted with severe MR, suspect primarily infarct-related MR given LV dilation and inferior/inferolateral severe hypokinesis.  However, the MV is also thickened and there is at least mild mitral stenosis (mean gradient 8 likely overestimates given severe MR with high flow).   - He will need a TEE eventually, would diurese and afterload reduce first.  - Unlikely to be a good Mitraclip candidate with degree of MS.  4. 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  ?due to sedentary lifestyle + ?genetic predisposition.   -  Was on Xarelto PTA, now heparin gtt.  Restart Xarelto prior to discharge.  5. Smoking: I strongly encouraged him to quit.  6. PNA: WBCs 21 => 13 but afebrile.  Culture NGTD.  RML PNA on CT chest.  - Currently on Augmentin.  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  - PT.   Need to mobilize.   Length of Stay: 4  Loralie Champagne, MD  01/31/2021, 8:50 AM  Advanced Heart Failure Team Pager [phone] (M-F; 7a - 5p)  Please contact [facility] for night-coverage after hours (5p -7a ) and weekends on [email or web address]

## 2021-01-31 NOTE — Assessment & Plan Note (Signed)
--   Etiology unclear, microcytosis significant -- Iron supplementation, follow-up as an outpatient

## 2021-01-31 NOTE — Progress Notes (Signed)
IV Team consulted regarding blood continuing to ooze from IJ. Secure chat messaged patient's nusre Yoko RN/Stephanie RN. Requesting MD to order Thrombi pad. Instructed to place consult one thrombi pad arrived to unit. Tomasita Morrow, RN VAST

## 2021-01-31 NOTE — Progress Notes (Addendum)
ANTICOAGULATION CONSULT NOTE  Pharmacy Consult to transition back to heparin Indication: pre-existing DVT  Allergies  Allergen Reactions   Iodide Rash    Patient Measurements: Height: 5\' 11"  (180.3 cm) Weight: 79.9 kg (176 lb 2.4 oz) IBW/kg (Calculated) : 75.3  Heparin Dosing Weight: 72.6 kg  Vital Signs: Temp: 97.9 F (36.6 C) (11/14 0437) Temp Source: Oral (11/14 0437) BP: 100/63 (11/14 0437) Pulse Rate: 75 (11/14 0437)  Labs: Recent Labs    01/28/21 1004 01/29/21 0104 01/29/21 1430 01/30/21 0456 01/30/21 0906 01/30/21 1442 01/30/21 1750 01/31/21 0620  HGB  --  10.2*  --  10.2*  --   --   --   --   HCT  --  35.5*  --  36.6*  --   --   --   --   PLT  --  167  --  157  --   --   --   --   APTT   < >  --   --  146* 136*  --  119* 53*  HEPARINUNFRC  --   --   --  >1.10* >1.10*  --   --  0.85*  CREATININE  --   --  1.32* 1.39*  --  1.18  --   --    < > = values in this interval not displayed.     Estimated Creatinine Clearance: 86.9 mL/min (by C-G formula based on SCr of 1.18 mg/dL).   Medications:  Medications Prior to Admission  Medication Sig Dispense Refill Last Dose   XARELTO 20 MG TABS tablet Take 20 mg by mouth every evening.   01/25/2021 at 1900   Infusions:   sodium chloride Stopped (01/30/21 2329)   sodium chloride 10 mL/hr at 01/31/21 0640   amiodarone 60 mg/hr (01/31/21 0629)   heparin 1,050 Units/hr (01/30/21 2215)   magnesium sulfate bolus IVPB     milrinone 0.25 mcg/kg/min (01/31/21 0024)     Assessment: 40 y.o. male with medical history significant for asthma/COPD, recent DVT, tobacco abuse who presented with 3 days SOB and CP. CT showed no evidence of pulmonary embolism.. 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 Pharmacy consulted to initiate heparin infusion.   Heparin level came subtherapeutic at 0.85 (as expected with recent DOAC), aPTT is subtherapeutic at 53, on 1050 units/hr No s/sx of bleeding  or infusion issues.   Goal of Therapy:  aPTT goal 66-102s Heparin level 0.3-0.7 units/ml Monitor platelets by anticoagulation protocol: Yes   Plan:  Increase IV heparin gtt at 1200 units/hr Order 6 hour level  Continue to monitor daily CBC, heparin level, and aPTT Follow up timing of cardiac cath  Thank you for involving pharmacy in this patient's care.  45, PharmD, BCCCP Clinical Pharmacist  Phone: [phone] 01/31/2021 7:30 AM  Please check AMION for all [facility] Pharmacy phone numbers After 10:00 PM, call Main Pharmacy [phone]  ADDENDUM Underwent cardiac cath this afternoon. Given DVT, plan to restart heparin 8 hours post-sheath removal (documented on 11/14@1329 ). Will restart heparin infusion at 1200 units/hr on 11/24@2130  - will order levels 6 hours after restart.   12/24, PharmD, BCCCP Clinical Pharmacist

## 2021-01-31 NOTE — Interval H&P Note (Signed)
History and Physical Interval Note:  01/31/2021 12:29 PM  Mark Garrett  has presented today for surgery, with the diagnosis of HF.  The various methods of treatment have been discussed with the patient and family. After consideration of risks, benefits and other options for treatment, the patient has consented to  Procedure(s): RIGHT/LEFT HEART CATH AND CORONARY/GRAFT ANGIOGRAPHY (N/A) as a surgical intervention.  The patient's history has been reviewed, patient examined, no change in status, stable for surgery.  I have reviewed the patient's chart and labs.  Questions were answered to the patient's satisfaction.     Yenni Carra [employer]

## 2021-01-31 NOTE — Progress Notes (Signed)
Date and time results received:  01/30/21 @ 23:01  Test: Lactic Acid   Critical Value: 2.4  Name of Provider Notified: Carollee Herter, DO  Value reduced from 3.0 on 01/30/21 @ 14:42  No additional orders at this time. Will continue to monitor.

## 2021-01-31 NOTE — Assessment & Plan Note (Signed)
--   Now trending down, doubt infectious in nature, may have been related to steroid --Follow clinically.

## 2021-01-31 NOTE — Plan of Care (Signed)
  Problem: Education: Goal: Knowledge of General Education information will improve Description: Including pain rating scale, medication(s)/side effects and non-pharmacologic comfort measures Outcome: Progressing   Problem: Elimination: Goal: Will not experience complications related to urinary retention Outcome: Progressing   Problem: Education: Goal: Understanding of CV disease, CV risk reduction, and recovery process will improve Outcome: Progressing  Problem: Education: Goal: Ability to demonstrate management of disease process will improve Outcome: Progressing

## 2021-01-31 NOTE — Progress Notes (Signed)
Progress Note Mark Garrett   WYO:[id number]  DOB: [birth year]  DOA: 01/27/2021     4 Date of Service: 01/31/2021   Clinical Course 40 year old man PMH including COPD, DVT, ran out of anticoagulation several days prior, presented with throbbing chest pain with radiation to left arm and shoulder, hypotensive in the field and given fluids.  Noted to be hypoxic. --11/10 bilateral lower extremity Doppler showed indeterminate DVT.  Admitted for acute hypoxic respiratory failure, pneumonia, COPD exacerbation, lactic acidosis, chest pain rule out with elevated troponin -- 11/11 echocardiogram revealed systolic dysfunction, global hypokinesis, unclear whether new or old, started on diuresis -- 11/12 patient reports chest pain resolved, was throbbing left shoulder left arm and left jaw, neck prior to admission with associated shortness of breath.  Multiple episodes of ectopy and NSVT.  Seen by cardiology.  Transferred to cardiac unit, started on heparin, amiodarone.  Central line placed, unable to place PICC. --11/13 feels okay today.  Seen by cardiology and advanced heart failure.  Started on milrinone. --11/14 no chest pain, feels okay today, breathing okay.  Dominant issues are cardiac in nature.  Pneumonia appears to be resolving rapidly would complete total 5 days antibiotics.  Discussed with Dr. Shirlee Latch who has graciously accepted the patient to his service.  I will follow-up in consult tomorrow.  Assessment and Plan * Acute systolic CHF (congestive heart failure) (HCC) -- New diagnosis, LVEF 35-40% with severe inferior and inferolateral wall hypokinesis -- With continued volume overload.  Secondary to ischemic cardiomyopathy, complicated by severe mitral regurgitation.  Management per advanced heart failure, continued on milrinone, high-dose Lasix, spironolactone and losartan  --Plan for right and left heart cath today  Acute respiratory failure with hypoxia (HCC)-resolved as of 01/31/2021 -- H&P  documented SPO2 87%, resolved off oxygen, suspect secondary to CHF more so than pneumonia.  Cardiogenic shock (HCC) -- Low output systolic CHF complicated by severe mitral regurgitation.  Milrinone and further management per cardiology.  NSTEMI (non-ST elevated myocardial infarction) (HCC) -- Remains asymptomatic, no chest pain.  Continues heparin, aspirin per cardiology.  ARB and spironolactone.  Likely prior to admission given relatively low troponin. -- Plan for left and right heart cath today.  CAP (community acquired pneumonia) -- Appears appears resolved clinically.  Would complete total 5 days antibiotics.  Paroxysmal VT -- PVCs and ectopy now much better controlled on amiodarone -- Potassium at goal.  Magnesium at goal yesterday.   Severe mitral regurgitation -- Management per cardiology  COPD (chronic obstructive pulmonary disease) (HCC) -- No wheezing on exam today, good air movement, appears stable, apparently not on bronchodilators -- Recommend smoking cessation  DVT (deep venous thrombosis) (HCC) -- Bilateral lower extremity DVT.  Suspect chronic by history.  Imaging, CT did not show large embolus, will continue anticoagulation  Serum total bilirubin elevated --Isolated, etiology unclear, reports occasional alcohol use only --Trending down, probably secondary to acute CHF, check CMP in a.m.  Microcytic anemia -- Etiology unclear, microcytosis significant -- Iron supplementation, follow-up as an outpatient  Leukocytosis -- Now trending down, doubt infectious in nature, may have been related to steroid --Follow clinically.   Cigarette smoker -- Recommend cessation  Dominant issues appear cardiac in nature.  Pneumonia appears to be resolving rapidly would complete total 5 days antibiotics.  Discussed with Dr. Shirlee Latch who has graciously accepted the patient to his service.  I will follow-up in consult tomorrow.  Subjective:  Feels okay, breathing okay, no chest  pain  Objective Vitals:   01/30/21  2331 01/31/21 0433 01/31/21 0437 01/31/21 0749  BP: 99/73 90/68 100/63 (!) 87/56  Pulse: 75 74 75 71  Resp: 15 13 16 15   Temp: (!) 97.2 F (36.2 C) 97.9 F (36.6 C) 97.9 F (36.6 C) 97.7 F (36.5 C)  TempSrc:  Oral Oral Oral  SpO2: 97% 100% 98% 96%  Weight:  79.9 kg    Height:       79.9 kg  Vital signs were reviewed and unremarkable. Telemetry sinus rhythm with PVCs Exam Physical Exam Vitals reviewed.  Constitutional:      General: He is not in acute distress.    Appearance: He is not ill-appearing or toxic-appearing.  Cardiovascular:     Rate and Rhythm: Normal rate and regular rhythm.     Heart sounds: No murmur heard. Pulmonary:     Effort: Pulmonary effort is normal. No respiratory distress.     Breath sounds: No wheezing, rhonchi or rales.  Musculoskeletal:     Right lower leg: Edema present.     Left lower leg: Edema present.     Comments: Still has significant pitting edema  Psychiatric:        Mood and Affect: Mood normal.        Behavior: Behavior normal.    Labs / Other Information My review of labs, imaging, notes and other tests is significant for sodium mildly low at 132.  Creatinine slightly up at 1.39.   Hemoglobin slightly lower at 9.4.  Disposition Plan: Status is: Inpatient  Remains inpatient appropriate because: Acute systolic CHF with volume overload, cardiogenic shock  Heparin No family present  Time spent: 20 minutes [facility] 01/31/2021, 10:14 AM

## 2021-01-31 NOTE — Progress Notes (Signed)
Advised by CHF on call to restart milrinone and monitor patient.

## 2021-01-31 NOTE — Progress Notes (Signed)
Contacted pharmacy to verify that amiodarone needs to be discontinued. After further review by pharmacy. Amiodarone was restarted by pharmacy.

## 2021-01-31 NOTE — Progress Notes (Signed)
RT attempted to place aline with any success. 2 RT's attempted without any success. RN aware.

## 2021-02-01 ENCOUNTER — Encounter (HOSPITAL_COMMUNITY): Payer: Self-pay | Admitting: Cardiology

## 2021-02-01 LAB — CBC
HCT: 31.4 % — ABNORMAL LOW (ref 39.0–52.0)
Hemoglobin: 8.9 g/dL — ABNORMAL LOW (ref 13.0–17.0)
MCH: 19.4 pg — ABNORMAL LOW (ref 26.0–34.0)
MCHC: 28.3 g/dL — ABNORMAL LOW (ref 30.0–36.0)
MCV: 68.4 fL — ABNORMAL LOW (ref 80.0–100.0)
Platelets: 153 10*3/uL (ref 150–400)
RBC: 4.59 MIL/uL (ref 4.22–5.81)
RDW: 23.1 % — ABNORMAL HIGH (ref 11.5–15.5)
WBC: 9.8 10*3/uL (ref 4.0–10.5)
nRBC: 2.3 % — ABNORMAL HIGH (ref 0.0–0.2)

## 2021-02-01 LAB — CULTURE, BLOOD (ROUTINE X 2)
Culture: NO GROWTH
Culture: NO GROWTH
Special Requests: ADEQUATE
Special Requests: ADEQUATE

## 2021-02-01 LAB — COMPREHENSIVE METABOLIC PANEL
ALT: 29 U/L (ref 0–44)
AST: 27 U/L (ref 15–41)
Albumin: 2.7 g/dL — ABNORMAL LOW (ref 3.5–5.0)
Alkaline Phosphatase: 88 U/L (ref 38–126)
Anion gap: 8 (ref 5–15)
BUN: 25 mg/dL — ABNORMAL HIGH (ref 6–20)
CO2: 33 mmol/L — ABNORMAL HIGH (ref 22–32)
Calcium: 8.1 mg/dL — ABNORMAL LOW (ref 8.9–10.3)
Chloride: 89 mmol/L — ABNORMAL LOW (ref 98–111)
Creatinine, Ser: 1.21 mg/dL (ref 0.61–1.24)
GFR, Estimated: >60 mL/min (ref >60)
Glucose, Bld: 137 mg/dL — ABNORMAL HIGH (ref 70–99)
Potassium: 3.9 mmol/L (ref 3.5–5.1)
Sodium: 130 mmol/L — ABNORMAL LOW (ref 135–145)
Total Bilirubin: 2.9 mg/dL — ABNORMAL HIGH (ref 0.3–1.2)
Total Protein: 5 g/dL — ABNORMAL LOW (ref 6.5–8.1)

## 2021-02-01 LAB — COOXEMETRY PANEL
Carboxyhemoglobin: 1.7 % — ABNORMAL HIGH (ref 0.5–1.5)
Methemoglobin: 0.9 % (ref 0.0–1.5)
O2 Saturation: 58.2 %
Total hemoglobin: 9 g/dL — ABNORMAL LOW (ref 12.0–16.0)

## 2021-02-01 LAB — APTT
aPTT: 85 seconds — ABNORMAL HIGH (ref 24–36)
aPTT: 79 seconds — ABNORMAL HIGH (ref 24–36)

## 2021-02-01 LAB — HEPARIN LEVEL (UNFRACTIONATED)
Heparin Unfractionated: 0.45 IU/mL (ref 0.30–0.70)
Heparin Unfractionated: 0.42 IU/mL (ref 0.30–0.70)

## 2021-02-01 MED ORDER — POTASSIUM CHLORIDE CRYS ER 20 MEQ PO TBCR: 40.0000 meq | EXTENDED_RELEASE_TABLET | Freq: Two times a day (BID) | ORAL | Status: DC

## 2021-02-01 MED ORDER — POTASSIUM CHLORIDE CRYS ER 20 MEQ PO TBCR
40.0000 meq | EXTENDED_RELEASE_TABLET | Freq: Two times a day (BID) | ORAL | Status: AC
  Administered 2021-02-01 (×2): 40 meq via ORAL
  Filled 2021-02-01 (×2): qty 2

## 2021-02-01 MED ORDER — SPIRONOLACTONE 12.5 MG HALF TABLET
12.5000 mg | ORAL_TABLET | Freq: Every day | ORAL | Status: DC
  Administered 2021-02-01 – 2021-02-06 (×6): 12.5 mg via ORAL
  Filled 2021-02-01 (×6): qty 1

## 2021-02-01 NOTE — Progress Notes (Addendum)
ANTICOAGULATION CONSULT NOTE  Pharmacy Consult to transition back to heparin Indication: pre-existing DVT  Allergies  Allergen Reactions   Iodide Rash    Patient Measurements: Height: 5\' 11"  (180.3 cm) Weight: 78.6 kg (173 lb 4.5 oz) IBW/kg (Calculated) : 75.3  Heparin Dosing Weight: 72.6 kg  Vital Signs: Temp: 97.6 F (36.4 C) (11/14 1900) Temp Source: Oral (11/14 1900) BP: 105/80 (11/15 0430) Pulse Rate: 84 (11/15 0430)  Labs: Recent Labs    01/30/21 0456 01/30/21 0906 01/30/21 1442 01/30/21 1750 01/31/21 0620 01/31/21 0900 01/31/21 1259 01/31/21 1329 01/31/21 1526 02/01/21 0344  HGB 10.2*  --   --   --  9.4*  --    < > 11.6* 9.2* 8.9*  HCT 36.6*  --   --   --  33.8*  --    < > 34.0* 32.3* 31.4*  PLT 157  --   --   --  151  --   --   --  150 153  APTT 146* 136*  --  119* 53*  --   --   --   --   --   HEPARINUNFRC >1.10* >1.10*  --   --  0.85*  --   --   --   --   --   CREATININE 1.39*  --    < >  --  1.39* 1.36*  --   --  1.15 1.21   < > = values in this interval not displayed.     Estimated Creatinine Clearance: 84.7 mL/min (by C-G formula based on SCr of 1.21 mg/dL).   Medications:  Medications Prior to Admission  Medication Sig Dispense Refill Last Dose   XARELTO 20 MG TABS tablet Take 20 mg by mouth every evening.   01/25/2021 at 1900   Infusions:   sodium chloride     sodium chloride Stopped (01/31/21 2357)   amiodarone 30 mg/hr (02/01/21 0400)   furosemide (LASIX) 200 mg in dextrose 5% 100 mL (2mg /mL) infusion 10 mg/hr (02/01/21 0400)   heparin 1,200 Units/hr (02/01/21 0400)   magnesium sulfate bolus IVPB     milrinone 0.375 mcg/kg/min (02/01/21 0400)   norepinephrine (LEVOPHED) Adult infusion Stopped (02/01/21 0111)    Assessment: 40 y.o. male with medical history significant for asthma/COPD, recent DVT, tobacco abuse who presented with 3 days SOB and CP. CT showed no evidence of pulmonary embolism.. Bilateral lower extremity ultrasound  reconfirmed known age indeterminate DVT. Pharmacy consulted to initiate heparin infusion.   aPTT of 85 sec and heparin level of 0.46 are therapeutic and now correlating on 1200 units/hr after holding heparin for cardiac cath and restarting at 2130 (8h post sheath removal). No s/sx of bleeding or infusion issues. CBC stable.  11/15 PM Update: Confirmatory aPTT (79 sec) and heparin level (0.42) therapeutic. Will use anti-Xa monitoring now that DOAC not impacting levels.  Goal of Therapy:  aPTT goal 66-102 sec Heparin level 0.3-0.7 units/ml Monitor platelets by anticoagulation protocol: Yes   Plan:  Continue heparin infusion at 1200 units/hr Continue to monitor daily CBC, heparin level Monitor s/sx bleeding  2131, PharmD PGY1 Pharmacy Resident 02/01/2021  2:56 PM  Please check [email or web address] for unit-specific pharmacy phone numbers.

## 2021-02-01 NOTE — Progress Notes (Signed)
  Progress Note EVONTE PRESTAGE   [id number]  DOB: 1980-06-19  DOA: 01/27/2021     5 Date of Service: 02/01/2021  Dominant issues are cardiac in nature, from a general medical perspective patient appears stable, will sign off at this time, recommendations as below.  Do not hesitate to consult if we can be of further assistance.  Assessment and Plan CAP  -- Appears appears resolved clinically.  Would complete total 5 days antibiotics.  COPD (HCC) -- stable without exacerbation, apparently not on bronchodilators -- Recommend smoking cessation  DVT (HCC) -- Bilateral lower extremity DVT.  Suspect chronic by history.  Imaging, CT did not show large embolus -- recommend oral anticoagulation on discharge and follow-up with PCP  Serum total bilirubin elevated --Isolated, etiology unclear, reports occasional alcohol use only --Trending down, probably secondary to acute CHF, follow-up as outpatient  Microcytic anemia -- Etiology unclear, microcytosis significant, suggest iron supplementation, follow-up as an outpatient  No charge note  Brendia Sacks, MD [facility]  02/01/2021, 4:51 PM

## 2021-02-01 NOTE — Evaluation (Addendum)
Occupational Therapy Evaluation Patient Details Name: Mark Garrett MRN: [id number] DOB: 04/28/1980 Today's Date: 02/01/2021   History of Present Illness Pt is a 40 y/o male admitted due to CHF exacerbation and underwent R/L heart cath and coronary graft angiography on 11/14. PMH: CHF, CAD (s/p CABG 2006), mitral valve disease, traumatic amputation of L foot.   Clinical Impression   PTA, pt lives alone and reports typically Modified Independence with ADLs, IADLs and mobility with use of no AD vs scooter for long distance mobility. Pt endorses 1-2 recent falls. Pt presents now with mild deficits in standing balance and endurance. Pt able to mobilize on a min guard basis without AD, Setup for UB ADLs and min guard for LB ADLs. Pt denies SOB, pain or dizziness with mobility. Anticipate no OT needs at DC but will continue to follow acutely to progress endurance and safety with ADLs/IADLs at home.  BP pre activity: 83/52 (62) BP post activity: 116/66 (79) SpO2 97% at rest on RA, 92% with activity HR WFL (70s-80s)     Recommendations for follow up therapy are one component of a multi-disciplinary discharge planning process, led by the attending physician.  Recommendations may be updated based on patient status, additional functional criteria and insurance authorization.   Follow Up Recommendations  No OT follow up    Assistance Recommended at Discharge PRN  Functional Status Assessment  Patient has had a recent decline in their functional status and demonstrates the ability to make significant improvements in function in a reasonable and predictable amount of time.  Equipment Recommendations  None recommended by OT    Recommendations for Other Services       Precautions / Restrictions Precautions Precautions: Fall;Other (comment) Precaution Comments: hx of L forefoot amputation (does not have prosthetic in room; ambulates with/without it at home) Restrictions Weight Bearing  Restrictions: No      Mobility Bed Mobility Overal bed mobility: Modified Independent             General bed mobility comments: after set up for lines    Transfers Overall transfer level: Needs assistance Equipment used: None Transfers: Sit to/from Stand Sit to Stand: Min guard           General transfer comment: min guard for safety without use of AD      Balance Overall balance assessment: Needs assistance Sitting-balance support: Feet supported;No upper extremity supported Sitting balance-Leahy Scale: Good     Standing balance support: Single extremity supported;No upper extremity supported;During functional activity Standing balance-Leahy Scale: Good Standing balance comment: able to mobilize short distances without AD, mild unsteadiness noted                           ADL either performed or assessed with clinical judgement   ADL Overall ADL's : Needs assistance/impaired Eating/Feeding: Independent   Grooming: Supervision/safety;Standing;Oral care Grooming Details (indicate cue type and reason): for safety due to mild unsteadiness Upper Body Bathing: Set up;Sitting   Lower Body Bathing: Min guard;Sit to/from stand   Upper Body Dressing : Set up;Sitting   Lower Body Dressing: Min guard;Sit to/from stand Lower Body Dressing Details (indicate cue type and reason): able to don socks sitting EOB with figure four position Toilet Transfer: Min guard;Ambulation   Toileting- Clothing Manipulation and Hygiene: Min guard;Sit to/from stand         General ADL Comments: Mild unsteadiness without prosthetic and without AD, denies SOB, pain or dizziness with  activity     Vision Baseline Vision/History: 0 No visual deficits Ability to See in Adequate Light: 0 Adequate Patient Visual Report: No change from baseline Vision Assessment?: No apparent visual deficits     Perception     Praxis      Pertinent Vitals/Pain Pain Assessment: No/denies  pain     Hand Dominance Right   Extremity/Trunk Assessment Upper Extremity Assessment Upper Extremity Assessment: Overall WFL for tasks assessed   Lower Extremity Assessment Lower Extremity Assessment: Defer to PT evaluation   Cervical / Trunk Assessment Cervical / Trunk Assessment: Normal   Communication Communication Communication: No difficulties   Cognition Arousal/Alertness: Awake/alert Behavior During Therapy: WFL for tasks assessed/performed Overall Cognitive Status: Within Functional Limits for tasks assessed                                       General Comments       Exercises     Shoulder Instructions      Home Living Family/patient expects to be discharged to:: Private residence Living Arrangements: Alone Available Help at Discharge: Neighbor;Available PRN/intermittently Type of Home: House Home Access: Stairs to enter [employer] of Steps: 2 Entrance Stairs-Rails: Right;Left Home Layout: One level;Other (Comment) (2 steps down into living room)     Bathroom Shower/Tub: Tub/shower unit (brings in water via buckets)   Bathroom Toilet: Standard (brings in water via buckets)     Home Equipment: Electric scooter   Additional Comments: does not have power, running water at home. Accessing water from neighbors, wood for wood burning stove      Prior Functioning/Environment Prior Level of Function : Independent/Modified Independent             Mobility Comments: no use of AD for mobility, has prosthetic but can mobilize with or without it. does endorse 1-2 recent falls (one from scooter and one from chair at home) ADLs Comments: able to complete ADLs, simple meals that do not have to be heated up, has electric scooter he uses in community, goes to [employer] for laundry completion        OT Problem List: Decreased activity tolerance;Impaired balance (sitting and/or standing)      OT Treatment/Interventions:  Self-care/ADL training;Therapeutic exercise;Energy conservation;DME and/or AE instruction;Therapeutic activities;Patient/family education;Balance training    OT Goals(Current goals can be found in the care plan section) Acute Rehab OT Goals Patient Stated Goal: feel better, go home soon OT Goal Formulation: With patient Time For Goal Achievement: 02/15/21 Potential to Achieve Goals: Good ADL Goals Pt Will Transfer to Toilet: ambulating;Independently Pt Will Perform Tub/Shower Transfer: Tub transfer;Independently;ambulating Pt/caregiver will Perform Home Exercise Program: Increased strength;Both right and left upper extremity;With theraband;Independently;With written HEP provided Additional ADL Goal #1: Pt to verbalize at least 3 fall prevention strategies to implement at home Additional ADL Goal #2: Pt to increase standing tolerance > 10 min during ADLs/IADLs to maximize overall endurance  OT Frequency: Min 2X/week   Barriers to D/C:            Co-evaluation              AM-PAC OT "6 Clicks" Daily Activity     Outcome Measure Help from another person eating meals?: None Help from another person taking care of personal grooming?: A Little Help from another person toileting, which includes using toliet, bedpan, or urinal?: A Little Help from another person bathing (including  washing, rinsing, drying)?: A Little Help from another person to put on and taking off regular upper body clothing?: None Help from another person to put on and taking off regular lower body clothing?: A Little 6 Click Score: 20   End of Session Equipment Utilized During Treatment: Gait belt Nurse Communication: Mobility status;Other (comment) (vitals)  Activity Tolerance: Patient tolerated treatment well Patient left: in bed;Other (comment) (sitting EOB with PT)  OT Visit Diagnosis: Unsteadiness on feet (R26.81);Other abnormalities of gait and mobility (R26.89)                Time: 1336-1400 OT Time  Calculation (min): 24 min Charges:  OT General Charges $OT Visit: 1 Visit OT Evaluation $OT Eval Moderate Complexity: 1 Mod OT Treatments $Self Care/Home Management : 8-22 mins  Bradd Canary, OTR/L Acute Rehab Services Office: [phone]   Lorre Munroe 02/01/2021, 2:31 PM

## 2021-02-01 NOTE — Evaluation (Addendum)
Physical Therapy Evaluation Patient Details Name: Mark Garrett MRN: [id number] DOB: 1980-12-13 Today's Date: 02/01/2021  History of Present Illness  Pt is a 40 y/o male admitted due to CHF exacerbation and underwent R/L heart cath and coronary graft angiography on 11/14. PMH: CHF, CAD (s/p CABG 2006), mitral valve disease, traumatic amputation of L foot.  Clinical Impression  Pt presents to PT with slightly unsteady gait due to illness and inactivity. Expect pt will make good progress back to baseline with mobility. Will follow acutely but doubt pt will need PT after DC. Home situation (no electricity or running water) may complicate medical management.         Recommendations for follow up therapy are one component of a multi-disciplinary discharge planning process, led by the attending physician.  Recommendations may be updated based on patient status, additional functional criteria and insurance authorization.  Follow Up Recommendations No PT follow up    Assistance Recommended at Discharge None  Functional Status Assessment Patient has had a recent decline in their functional status and demonstrates the ability to make significant improvements in function in a reasonable and predictable amount of time.  Equipment Recommendations  None recommended by PT    Recommendations for Other Services       Precautions / Restrictions Precautions Precautions: Fall;Other (comment) Precaution Comments: hx of L forefoot amputation (does not have prosthetic in room; ambulates with/without it at home) Restrictions Weight Bearing Restrictions: No      Mobility  Bed Mobility Overal bed mobility: Modified Independent                 Transfers Overall transfer level: Needs assistance Equipment used: None Transfers: Sit to/from Stand Sit to Stand: Min guard           General transfer comment: Assist for safety and lines    Ambulation/Gait Ambulation/Gait assistance: Min  guard Gait Distance (Feet): 250 Feet Assistive device: None Gait Pattern/deviations: Step-through pattern;Decreased stride length Gait velocity: decr Gait velocity interpretation: <1.31 ft/sec, indicative of household ambulator   General Gait Details: Slightly unsteady but no overt loss of balance. Pt's prosthetic at home and he hasn't been able to wear in week prior due to edema so pt amb on residual foot.  Stairs            Wheelchair Mobility    Modified Rankin (Stroke Patients Only)       Balance Overall balance assessment: Needs assistance Sitting-balance support: Feet supported;No upper extremity supported Sitting balance-Leahy Scale: Good     Standing balance support: No upper extremity supported Standing balance-Leahy Scale: Good Standing balance comment: able to mobilize short distances without AD, mild unsteadiness noted                             Pertinent Vitals/Pain Pain Assessment: No/denies pain    Home Living Family/patient expects to be discharged to:: Private residence Living Arrangements: Alone Available Help at Discharge: Neighbor;Available PRN/intermittently Type of Home: House Home Access: Stairs to enter Entrance Stairs-Rails: Right;Left Entrance Stairs-Number of Steps: 2   Home Layout: One level;Other (Comment) (2 steps down to living room) Home Equipment: Electric scooter Additional Comments: does not have power, running water at home. Accessing water from neighbors, wood for wood burning stove    Prior Function Prior Level of Function : Independent/Modified Independent             Mobility Comments: no use of AD for  mobility, has prosthetic but can mobilize with or without it. does endorse 1-2 recent falls (one from scooter and one from chair at home) ADLs Comments: able to complete ADLs, simple meals that do not have to be heated up, has electric scooter he uses in community, goes to [employer] for laundry completion      Hand Dominance   Dominant Hand: Right    Extremity/Trunk Assessment   Upper Extremity Assessment Upper Extremity Assessment: Defer to OT evaluation    Lower Extremity Assessment Lower Extremity Assessment: Generalized weakness;LLE deficits/detail LLE Deficits / Details: foot amputation    Cervical / Trunk Assessment Cervical / Trunk Assessment: Normal  Communication   Communication: No difficulties  Cognition Arousal/Alertness: Awake/alert Behavior During Therapy: WFL for tasks assessed/performed Overall Cognitive Status: Within Functional Limits for tasks assessed                                          General Comments      Exercises     Assessment/Plan    PT Assessment Patient needs continued PT services  PT Problem List Decreased strength;Decreased balance;Decreased mobility;Decreased activity tolerance       PT Treatment Interventions DME instruction;Functional mobility training;Balance training;Patient/family education;Therapeutic activities;Gait training;Stair training;Therapeutic exercise    PT Goals (Current goals can be found in the Care Plan section)  Acute Rehab PT Goals Patient Stated Goal: return home PT Goal Formulation: With patient Time For Goal Achievement: 02/08/21 Potential to Achieve Goals: Good    Frequency Min 3X/week   Barriers to discharge        Co-evaluation               AM-PAC PT "6 Clicks" Mobility  Outcome Measure Help needed turning from your back to your side while in a flat bed without using bedrails?: None Help needed moving from lying on your back to sitting on the side of a flat bed without using bedrails?: None Help needed moving to and from a bed to a chair (including a wheelchair)?: A Little Help needed standing up from a chair using your arms (e.g., wheelchair or bedside chair)?: A Little Help needed to walk in hospital room?: A Little Help needed climbing 3-5 steps with a railing? : A  Little 6 Click Score: 20    End of Session   Activity Tolerance: Patient tolerated treatment well Patient left: in bed;with call bell/phone within reach Nurse Communication: Mobility status PT Visit Diagnosis: Unsteadiness on feet (R26.81);Muscle weakness (generalized) (M62.81)    Time: [id number] PT Time Calculation (min) (ACUTE ONLY): 12 min   Charges:   PT Evaluation $PT Eval Moderate Complexity: [place] Pager [phone] Office [place] 02/01/2021, 4:45 PM

## 2021-02-01 NOTE — Progress Notes (Signed)
Patient ID: Mark Garrett, male   DOB: [birth year], 40 y.o.   MRN: [id number]     Advanced Heart Failure Rounding Note  PCP-Cardiologist: None   Subjective:    This morning, he is on milrinone 0.375 with co-ox 58%.  CVP 13 on Lasix gtt 10 mg/hr.  Weight down another 3 lbs.  Creatinine stable 1.2.   He is in NSR on amiodarone gtt with rare PVCs.   LHC/RHC:   Coronary Findings  Diagnostic Dominance: Right Left Anterior Descending  Mid LAD lesion is 95% stenosed.  Ramus Intermedius  Ramus-1 lesion is 50% stenosed.  Ramus-2 lesion is 80% stenosed.  Right Coronary Artery  Mid RCA lesion is 30% stenosed.  Graft To Ramus  Origin lesion is 100% stenosed.  LIMA Graft To Dist LAD  Intervention  No interventions have been documented. Right Heart  Right Heart Pressures RHC Procedural Findings: Hemodynamics (mmHg) on milrinone 0.375 RA mean 18 RV 69/20 PA 76/25, mean 44 PCWP mean 22, v-waves to 37 mmHg LV 100/23 AO 100/60  Oxygen saturations: PA 58% AO 97%  Cardiac Output (Fick) 5.33  Cardiac Index (Fick) 2.67 PVR 4.1 WU  Cardiac Output (Thermo) 4.89 Cardiac Index (Thermo) 2.45  PVR 4.5 WU  PAPI: 2.8    Objective:   Weight Range: 78.6 kg Body mass index is 24.17 kg/m.   Vital Signs:   Temp:  [97.6 F (36.4 C)-98 F (36.7 C)] 98 F (36.7 C) (11/15 0723) Pulse Rate:  [57-128] 73 (11/15 0630) Resp:  [0-49] 15 (11/15 0700) BP: (66-122)/(42-89) 111/79 (11/15 0700) SpO2:  [0 %-100 %] 100 % (11/15 0700) Weight:  [78.6 kg] 78.6 kg (11/15 0440) Last BM Date: 01/30/21  Weight change: Filed Weights   01/30/21 0608 01/31/21 0433 02/01/21 0440  Weight: 82 kg 79.9 kg 78.6 kg    Intake/Output:   Intake/Output Summary (Last 24 hours) at 02/01/2021 0808 Last data filed at 02/01/2021 0600 Gross per 24 hour  Intake 1503.34 ml  Output 4000 ml  Net -2496.66 ml      Physical Exam    General: NAD Neck: JVP 14 cm, no thyromegaly or thyroid nodule.  Lungs:  Clear to auscultation bilaterally with normal respiratory effort. CV: Nondisplaced PMI.  Heart regular S1/S2, no S3/S4, 3/6 HSM apex.  1+ edema to knees.  Abdomen: Soft, nontender, no hepatosplenomegaly, no distention.  Skin: Intact without lesions or rashes.  Neurologic: Alert and oriented x 3.  Psych: Normal affect. Extremities: No clubbing or cyanosis. S/p left foot amputation HEENT: Normal.    Telemetry   NSR, no further NSVT overnight. Personally reviewed  Labs    CBC Recent Labs    01/31/21 1526 02/01/21 0344  WBC 8.8 9.8  HGB 9.2* 8.9*  HCT 32.3* 31.4*  MCV 68.6* 68.4*  PLT 150 153   Basic Metabolic Panel Recent Labs    54/62/70 0456 01/30/21 1442 01/31/21 0620 01/31/21 1526 02/01/21 0344  NA 133* 135   < > 133* 130*  K 3.8 2.9*   < > 3.9 3.9  CL 90* 88*   < > 94* 89*  CO2 28 33*   < > 29 33*  GLUCOSE 113* 119*   < > 115* 137*  BUN 37* 37*   < > 31* 25*  CREATININE 1.39* 1.18   < > 1.15 1.21  CALCIUM 8.3* 8.1*   < > 7.9* 8.1*  MG 2.3 2.6*  --   --   --    < > =  values in this interval not displayed.   Liver Function Tests Recent Labs    01/30/21 0456 02/01/21 0344  AST 46* 27  ALT 37 29  ALKPHOS 92 88  BILITOT 3.3* 2.9*  PROT 5.9* 5.0*  ALBUMIN 2.9* 2.7*   No results for input(s): LIPASE, AMYLASE in the last 72 hours. Cardiac Enzymes No results for input(s): CKTOTAL, CKMB, CKMBINDEX, TROPONINI in the last 72 hours.  BNP: BNP (last 3 results) Recent Labs    01/27/21 1034  BNP 1,733.0*    ProBNP (last 3 results) No results for input(s): PROBNP in the last 8760 hours.   D-Dimer No results for input(s): DDIMER in the last 72 hours. Hemoglobin A1C No results for input(s): HGBA1C in the last 72 hours. Fasting Lipid Panel Recent Labs    01/30/21 0456  CHOL 97  HDL 11*  LDLCALC 74  TRIG 58  CHOLHDL 8.8   Thyroid Function Tests No results for input(s): TSH, T4TOTAL, T3FREE, THYROIDAB in the last 72 hours.  Invalid input(s):  FREET3  Other results:   Imaging    CARDIAC CATHETERIZATION  Result Date: 01/31/2021   Mid LAD lesion is 95% stenosed.   Ramus-1 lesion is 50% stenosed.   Ramus-2 lesion is 80% stenosed.   Mid RCA lesion is 30% stenosed.   Origin lesion is 100% stenosed. 1.  Elevated R>L heart filling pressure. 2.  Adequate PAPI 3.  Mixed pulmonary venous/pulmonary arterial hypertension. 4.  Prominent V-waves in PCWP tracing 5.  Stable cardiac output on milrinone 0.375 6.  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  Patent LIMA-LAD with 99% mid LAD stenosis. Patent RCA and AV LCx.     Medications:     Scheduled Medications:  amoxicillin-clavulanate  1 tablet Oral Q12H   aspirin  81 mg Oral Daily   Chlorhexidine Gluconate Cloth  6 each Topical Daily   dextromethorphan-guaiFENesin  1 tablet Oral BID   feeding supplement  237 mL Oral BID BM   Living Better with Heart Failure Book   Does not apply Once   metolazone  2.5 mg Oral Once   multivitamin with minerals  1 tablet Oral Daily   nicotine  21 mg Transdermal QHS   pantoprazole  40 mg Oral Daily   potassium chloride  40 mEq Oral BID   rosuvastatin  40 mg Oral Daily   sodium chloride flush  10-40 mL Intracatheter Q12H   sodium chloride flush  3 mL Intravenous Q12H   sodium chloride flush  3 mL Intravenous Q12H   Thrombi-Pad  1 each Topical Once    Infusions:  sodium chloride     sodium chloride Stopped (01/31/21 2357)   amiodarone 30 mg/hr (02/01/21 0600)   furosemide (LASIX) 200 mg in dextrose 5% 100 mL (2mg /mL) infusion 10 mg/hr (02/01/21 0600)   heparin 1,200 Units/hr (02/01/21 0600)   magnesium sulfate bolus IVPB     milrinone 0.375 mcg/kg/min (02/01/21 0600)   norepinephrine (LEVOPHED) Adult infusion Stopped (02/01/21 0111)    PRN Medications: sodium chloride, Place/Maintain arterial line **AND** sodium chloride, acetaminophen, ondansetron (ZOFRAN) IV, sodium chloride flush, sodium chloride flush    Assessment/Plan    1. Acute systolic CHF: Ischemic cardiomyopathy, symptoms worsened by severe MR.  I reviewed the echo done today, showing EF 35-40%, severe hypokinesis inferior and inferolateral walls, moderate LV dilation with mild LVH, mild RV dilation with moderately decreased systolic function, restricted posterior mitral leaflet and calcified mitral valve with severe mitral regurgitation and at least mild  mitral stenosis (mean gradient 8 mmHg), PASP 60. Per report, echo at [facility] showed EF 55-60% with severe MR in 10/22.  It was a technically difficult study per report.  It is possible that he had a new ACS event prior to admission (?occlusion of SVG-ramus) with reported drop in EF though HS-TnI with mild elevation and no trend suggests that it was not immediately prior to admission.  Low output noted with co-ox 39% initially, lactate elevated when he was initially admitted.  Currently on milrinone 0.375 with co-ox 58%.  CVP down to 13 today, good diuresis.  - Continue milrinone 0.375.   - Continue Lasix gtt 10 mg/hr for at least 1 more day.  - He can get spironolactone 12.5 daily.  - Hold ARB/ARNI with soft BP.   2. CAD: S/p CABG 2006.  As above, with fall in EF and CHF exacerbation, possible ACS prior to admission.  However, mild HS-TnI elevation with no trend suggests that ACS was not immediately prior to admission.  Current elevation is likely demand ischemia from volume overload. LHC on 11/14 showed patent LIMA-LAD with SVG-ramus occluded at aorta (does not look new); there was complex 80% proximal ramus stenosis.  Minimal RCA disease and LAD territory well-supplied by LIMA.  No chest pain.  - Continue heparin gtt while off Xarelto.  - ASA 81 - Crestor 40 - For now, would manage medically.  If chest pain develops, could consider intervention on native ramus.  3.  Mitral valve disease: I reviewed echo.  The posterior MV leaflet is restricted with severe MR, suspect primarily infarct-related MR given LV  dilation and inferior/inferolateral severe hypokinesis.  However, the MV is also thickened and there is at least mild mitral stenosis (mean gradient 8 likely overestimates given severe MR with high flow).   - He will need a TEE, will plan for tomorrow after further diuresis.   - Unlikely to be a good Mitraclip candidate with degree of MS.  Will need to consider options after TEE.  4. 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  ?due to sedentary lifestyle + ?genetic predisposition.   - Was on Xarelto PTA, now heparin gtt.  Restart Xarelto prior to discharge.  5. Smoking: I strongly encouraged him to quit.  6. PNA: WBCs 21 => 13 => 9, afebrile.  Culture NGTD.  RML PNA on CT chest.  - Currently on Augmentin, complete 5 days abx.  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  - PT.   Need to mobilize.   CRITICAL CARE Performed by: Loralie Champagne  Total critical care time: 35 minutes  Critical care time was exclusive of separately billable procedures and treating other patients.  Critical care was necessary to treat or prevent imminent or life-threatening deterioration.  Critical care was time spent personally by me on the following activities: development of treatment plan with patient and/or surrogate as well as nursing, discussions with consultants, evaluation of patient's response to treatment, examination of patient, obtaining history from patient or surrogate, ordering and performing treatments and interventions, ordering and review of laboratory studies, ordering and review of radiographic studies, pulse oximetry and re-evaluation of patient's condition.   Length of Stay: Floyd Hill, MD  02/01/2021, 8:08 AM  Advanced Heart Failure Team Pager [phone] (M-F; 7a - 5p)  Please contact [facility] for night-coverage after hours (5p -7a ) and weekends on [email or web address]

## 2021-02-02 ENCOUNTER — Inpatient Hospital Stay (HOSPITAL_COMMUNITY): Payer: Self-pay

## 2021-02-02 ENCOUNTER — Encounter (HOSPITAL_COMMUNITY)
Admission: EM | Disposition: A | Discharge status: Home Health | Payer: Self-pay | Source: Home / Self Care | Attending: Cardiology

## 2021-02-02 ENCOUNTER — Encounter (HOSPITAL_COMMUNITY): Payer: Self-pay | Admitting: Internal Medicine

## 2021-02-02 ENCOUNTER — Inpatient Hospital Stay (HOSPITAL_COMMUNITY): Payer: Self-pay | Admitting: Certified Registered Nurse Anesthetist

## 2021-02-02 DIAGNOSIS — R7881 Bacteremia: Secondary | ICD-10-CM

## 2021-02-02 DIAGNOSIS — I340 Nonrheumatic mitral (valve) insufficiency: Secondary | ICD-10-CM

## 2021-02-02 HISTORY — PX: TEE WITHOUT CARDIOVERSION: SHX5443

## 2021-02-02 LAB — COOXEMETRY PANEL
Carboxyhemoglobin: 1.8 % — ABNORMAL HIGH (ref 0.5–1.5)
Carboxyhemoglobin: 1.5 % (ref 0.5–1.5)
Carboxyhemoglobin: 1.7 % — ABNORMAL HIGH (ref 0.5–1.5)
Methemoglobin: 0.8 % (ref 0.0–1.5)
Methemoglobin: 0.8 % (ref 0.0–1.5)
Methemoglobin: 0.9 % (ref 0.0–1.5)
O2 Saturation: 42.9 %
O2 Saturation: 35.8 %
O2 Saturation: 58.6 %
Total hemoglobin: 10.7 g/dL — ABNORMAL LOW (ref 12.0–16.0)
Total hemoglobin: 10.8 g/dL — ABNORMAL LOW (ref 12.0–16.0)
Total hemoglobin: 10.9 g/dL — ABNORMAL LOW (ref 12.0–16.0)

## 2021-02-02 LAB — CBC
HCT: 36.9 % — ABNORMAL LOW (ref 39.0–52.0)
Hemoglobin: 10.4 g/dL — ABNORMAL LOW (ref 13.0–17.0)
MCH: 19.1 pg — ABNORMAL LOW (ref 26.0–34.0)
MCHC: 28.2 g/dL — ABNORMAL LOW (ref 30.0–36.0)
MCV: 67.8 fL — ABNORMAL LOW (ref 80.0–100.0)
Platelets: 193 10*3/uL (ref 150–400)
RBC: 5.44 MIL/uL (ref 4.22–5.81)
RDW: 23.9 % — ABNORMAL HIGH (ref 11.5–15.5)
WBC: 12.1 10*3/uL — ABNORMAL HIGH (ref 4.0–10.5)
nRBC: 1.5 % — ABNORMAL HIGH (ref 0.0–0.2)

## 2021-02-02 LAB — MAGNESIUM: Magnesium: 1.8 mg/dL (ref 1.7–2.4)

## 2021-02-02 LAB — BASIC METABOLIC PANEL
Anion gap: 9 (ref 5–15)
BUN: 13 mg/dL (ref 6–20)
CO2: 36 mmol/L — ABNORMAL HIGH (ref 22–32)
Calcium: 9.2 mg/dL (ref 8.9–10.3)
Chloride: 88 mmol/L — ABNORMAL LOW (ref 98–111)
Creatinine, Ser: 1.22 mg/dL (ref 0.61–1.24)
GFR, Estimated: >60 mL/min (ref >60)
Glucose, Bld: 88 mg/dL (ref 70–99)
Potassium: 3.9 mmol/L (ref 3.5–5.1)
Sodium: 133 mmol/L — ABNORMAL LOW (ref 135–145)

## 2021-02-02 LAB — HEPARIN LEVEL (UNFRACTIONATED): Heparin Unfractionated: 0.56 IU/mL (ref 0.30–0.70)

## 2021-02-02 LAB — ECHO TEE
MV M vel: 4.85 m/s
MV Peak grad: 94.1 mmHg
Radius: 1.2 cm

## 2021-02-02 LAB — C-REACTIVE PROTEIN: CRP: 1.7 mg/dL — ABNORMAL HIGH (ref <1.0)

## 2021-02-02 LAB — SEDIMENTATION RATE: Sed Rate: 1 mm/hr (ref 0–16)

## 2021-02-02 SURGERY — ECHOCARDIOGRAM, TRANSESOPHAGEAL
Anesthesia: Monitor Anesthesia Care

## 2021-02-02 MED ORDER — POTASSIUM CHLORIDE CRYS ER 20 MEQ PO TBCR: 40.0000 meq | EXTENDED_RELEASE_TABLET | Freq: Once | ORAL | Status: DC

## 2021-02-02 MED ORDER — CLOPIDOGREL BISULFATE 75 MG PO TABS: 75.0000 mg | ORAL_TABLET | Freq: Every day | ORAL | Status: DC

## 2021-02-02 MED ORDER — EPHEDRINE SULFATE-NACL 50-0.9 MG/10ML-% IV SOSY
PREFILLED_SYRINGE | INTRAVENOUS | Status: DC | PRN
  Administered 2021-02-02 (×2): 5 mg via INTRAVENOUS

## 2021-02-02 MED ORDER — SODIUM CHLORIDE 0.9 % IV SOLN: INTRAVENOUS | Status: DC

## 2021-02-02 MED ORDER — SODIUM CHLORIDE 0.9 % IV SOLN
2.0000 g | Freq: Three times a day (TID) | INTRAVENOUS | Status: DC
  Administered 2021-02-02 – 2021-02-04 (×6): 2 g via INTRAVENOUS
  Filled 2021-02-02 (×6): qty 2

## 2021-02-02 MED ORDER — PROPOFOL 500 MG/50ML IV EMUL
INTRAVENOUS | Status: DC | PRN
  Administered 2021-02-02: 100 ug/kg/min via INTRAVENOUS

## 2021-02-02 MED ORDER — VANCOMYCIN HCL 1500 MG/300ML IV SOLN
1500.0000 mg | Freq: Once | INTRAVENOUS | Status: AC
  Administered 2021-02-02: 1500 mg via INTRAVENOUS
  Filled 2021-02-02: qty 300

## 2021-02-02 MED ORDER — BUTAMBEN-TETRACAINE-BENZOCAINE 2-2-14 % EX AERO
INHALATION_SPRAY | CUTANEOUS | Status: DC | PRN
  Administered 2021-02-02: 2 via TOPICAL

## 2021-02-02 MED ORDER — VANCOMYCIN HCL 1750 MG/350ML IV SOLN: 1750.0000 mg | INTRAVENOUS | Status: DC

## 2021-02-02 MED ORDER — PROPOFOL 10 MG/ML IV BOLUS
INTRAVENOUS | Status: DC | PRN
  Administered 2021-02-02: 20 mg via INTRAVENOUS

## 2021-02-02 MED ORDER — DIGOXIN 125 MCG PO TABS
0.1250 mg | ORAL_TABLET | Freq: Every day | ORAL | Status: DC
  Administered 2021-02-02 – 2021-02-16 (×15): 0.125 mg via ORAL
  Filled 2021-02-02 (×15): qty 1

## 2021-02-02 MED ORDER — TORSEMIDE 20 MG PO TABS
40.0000 mg | ORAL_TABLET | Freq: Every day | ORAL | Status: DC
  Administered 2021-02-02: 40 mg via ORAL
  Filled 2021-02-02 (×2): qty 2

## 2021-02-02 MED ORDER — MAGNESIUM SULFATE 2 GM/50ML IV SOLN
2.0000 g | Freq: Once | INTRAVENOUS | Status: AC
  Administered 2021-02-02: 2 g via INTRAVENOUS
  Filled 2021-02-02: qty 50

## 2021-02-02 MED ORDER — LIDOCAINE 2% (20 MG/ML) 5 ML SYRINGE
INTRAMUSCULAR | Status: DC | PRN
  Administered 2021-02-02: 80 mg via INTRAVENOUS

## 2021-02-02 MED ORDER — VANCOMYCIN HCL 1000 MG/200ML IV SOLN
1000.0000 mg | INTRAVENOUS | Status: DC
Start: 2021-02-03 — End: 2021-02-04
  Administered 2021-02-03: 16:00:00 1000 mg via INTRAVENOUS
  Filled 2021-02-02: qty 200

## 2021-02-02 MED ORDER — POTASSIUM CHLORIDE 20 MEQ PO PACK
40.0000 meq | PACK | Freq: Once | ORAL | Status: AC
Start: 2021-02-02 — End: 2021-02-02
  Administered 2021-02-02: 40 meq via ORAL
  Filled 2021-02-02: qty 2

## 2021-02-02 NOTE — Progress Notes (Addendum)
    [facility]  Date of Admission:  01/27/2021     Total days of antibiotics 7         BRIEF PROGRESS NOTE:  Received consult for Mr. Castronovo regarding findings of endocarditis on TEE. Attempted to see Mr. Castorena however he was not in the room at the time. 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 Formal consult to follow.    Marcos Eke, NP [facility]  02/02/2021  3:36 PM

## 2021-02-02 NOTE — Progress Notes (Signed)
ANTICOAGULATION CONSULT NOTE  Pharmacy Consult to transition back to heparin Indication: pre-existing DVT  Allergies  Allergen Reactions   Iodide Rash    Patient Measurements: Height: 5\' 11"  (180.3 cm) Weight: 78.6 kg (173 lb 4.5 oz) IBW/kg (Calculated) : 75.3  Heparin Dosing Weight: 72.6 kg  Vital Signs: Temp: 97.8 F (36.6 C) (11/15 2000) Temp Source: Oral (11/15 2000) BP: 127/92 (11/16 0500) Pulse Rate: 81 (11/16 0500)  Labs: Recent Labs    01/31/21 0620 01/31/21 0900 01/31/21 1259 01/31/21 1329 01/31/21 1526 02/01/21 0344 02/01/21 0840 02/01/21 1329 02/01/21 1331 02/02/21 0500  HGB 9.4*  --    < > 11.6* 9.2* 8.9*  --   --   --   --   HCT 33.8*  --    < > 34.0* 32.3* 31.4*  --   --   --   --   PLT 151  --   --   --  150 153  --   --   --   --   APTT 53*  --   --   --   --   --  85* 79*  --   --   HEPARINUNFRC 0.85*  --   --   --   --   --  0.45  --  0.42 0.56  CREATININE 1.39* 1.36*  --   --  1.15 1.21  --   --   --   --    < > = values in this interval not displayed.     Estimated Creatinine Clearance: 84.7 mL/min (by C-G formula based on SCr of 1.21 mg/dL).   Medications:  Medications Prior to Admission  Medication Sig Dispense Refill Last Dose   XARELTO 20 MG TABS tablet Take 20 mg by mouth every evening.   01/25/2021 at 1900   Infusions:   sodium chloride     sodium chloride Stopped (01/31/21 2357)   amiodarone 30 mg/hr (02/02/21 0400)   furosemide (LASIX) 200 mg in dextrose 5% 100 mL (2mg /mL) infusion 10 mg/hr (02/02/21 0400)   heparin 1,200 Units/hr (02/02/21 0400)   magnesium sulfate bolus IVPB     milrinone 0.375 mcg/kg/min (02/02/21 0400)   norepinephrine (LEVOPHED) Adult infusion Stopped (02/01/21 0111)    Assessment: 40 y.o. male with medical history significant for asthma/COPD, recent DVT, tobacco abuse who presented with 3 days SOB and CP. CT showed no evidence of pulmonary embolism.. Bilateral lower extremity ultrasound reconfirmed  known age indeterminate DVT. Pharmacy consulted to initiate heparin infusion.   Heparin level of 0.56 is therapeutic on 1200 units/hr. No s/sx of bleeding or infusion issues. CBC stable.  Goal of Therapy:  aPTT goal 66-102 sec Heparin level 0.3-0.7 units/ml Monitor platelets by anticoagulation protocol: Yes   Plan:  Continue heparin infusion at 1200 units/hr Continue to monitor daily CBC, heparin level Monitor s/sx bleeding  02/03/21, PharmD PGY1 Pharmacy Resident 02/02/2021  5:45 AM  Please check [email or web address] for unit-specific pharmacy phone numbers.

## 2021-02-02 NOTE — H&P (View-Only) (Signed)
Patient ID: Mark Garrett, male   DOB: [birth year], 40 y.o.   MRN: [id number]     Advanced Heart Failure Rounding Note  PCP-Cardiologist: None   Subjective:    This morning, he is on milrinone 0.375 with co-ox low at 36%.  CVP 5 on Lasix gtt 10 mg/hr.  Excellent diuresis yesterday with creatinine down to 1.22.  He is in NSR on amiodarone gtt with rare PVCs.   LHC/RHC:   Coronary Findings  Diagnostic Dominance: Right Left Anterior Descending  Mid LAD lesion is 95% stenosed.  Ramus Intermedius  Ramus-1 lesion is 50% stenosed.  Ramus-2 lesion is 80% stenosed.  Right Coronary Artery  Mid RCA lesion is 30% stenosed.  Graft To Ramus  Origin lesion is 100% stenosed.  LIMA Graft To Dist LAD  Intervention  No interventions have been documented. Right Heart  Right Heart Pressures RHC Procedural Findings: Hemodynamics (mmHg) on milrinone 0.375 RA mean 18 RV 69/20 PA 76/25, mean 44 PCWP mean 22, v-waves to 37 mmHg LV 100/23 AO 100/60  Oxygen saturations: PA 58% AO 97%  Cardiac Output (Fick) 5.33  Cardiac Index (Fick) 2.67 PVR 4.1 WU  Cardiac Output (Thermo) 4.89 Cardiac Index (Thermo) 2.45  PVR 4.5 WU  PAPI: 2.8    Objective:   Weight Range: 70.9 kg Body mass index is 21.8 kg/m.   Vital Signs:   Temp:  [97.6 F (36.4 C)-98 F (36.7 C)] 97.8 F (36.6 C) (11/15 2000) Pulse Rate:  [69-117] 74 (11/16 0600) Resp:  [12-24] 15 (11/16 0600) BP: (77-134)/(48-92) 122/91 (11/16 0600) SpO2:  [80 %-100 %] 96 % (11/16 0600) Weight:  [70.9 kg] 70.9 kg (11/16 0500) Last BM Date: 01/30/21  Weight change: Filed Weights   01/31/21 0433 02/01/21 0440 02/02/21 0500  Weight: 79.9 kg 78.6 kg 70.9 kg    Intake/Output:   Intake/Output Summary (Last 24 hours) at 02/02/2021 0721 Last data filed at 02/02/2021 0630 Gross per 24 hour  Intake 983.44 ml  Output 11400 ml  Net -10416.56 ml      Physical Exam    General: NAD Neck: No JVD, no thyromegaly or thyroid  nodule.  Lungs: Clear to auscultation bilaterally with normal respiratory effort. CV: Nondisplaced PMI.  Heart regular S1/S2, no S3/S4, 3/6 HSM apex.  No peripheral edema.  No carotid bruit.  Normal pedal pulses.  Abdomen: Soft, nontender, no hepatosplenomegaly, no distention.  Skin: Intact without lesions or rashes.  Neurologic: Alert and oriented x 3.  Psych: Normal affect. Extremities: No clubbing or cyanosis. S/p left foot traumatic amputation.  HEENT: Normal.    Telemetry   NSR, no further NSVT overnight. Personally reviewed  Labs    CBC Recent Labs    02/01/21 0344 02/02/21 0500  WBC 9.8 12.1*  HGB 8.9* 10.4*  HCT 31.4* 36.9*  MCV 68.4* 67.8*  PLT 153 0000000   Basic Metabolic Panel Recent Labs    01/30/21 1442 01/31/21 0620 02/01/21 0344 02/02/21 0500  NA 135   < > 130* 133*  K 2.9*   < > 3.9 3.9  CL 88*   < > 89* 88*  CO2 33*   < > 33* 36*  GLUCOSE 119*   < > 137* 88  BUN 37*   < > 25* 13  CREATININE 1.18   < > 1.21 1.22  CALCIUM 8.1*   < > 8.1* 9.2  MG 2.6*  --   --  1.8   < > = values in this interval not  displayed.   Liver Function Tests Recent Labs    02/01/21 0344  AST 27  ALT 29  ALKPHOS 88  BILITOT 2.9*  PROT 5.0*  ALBUMIN 2.7*   No results for input(s): LIPASE, AMYLASE in the last 72 hours. Cardiac Enzymes No results for input(s): CKTOTAL, CKMB, CKMBINDEX, TROPONINI in the last 72 hours.  BNP: BNP (last 3 results) Recent Labs    01/27/21 1034  BNP 1,733.0*    ProBNP (last 3 results) No results for input(s): PROBNP in the last 8760 hours.   D-Dimer No results for input(s): DDIMER in the last 72 hours. Hemoglobin A1C No results for input(s): HGBA1C in the last 72 hours. Fasting Lipid Panel No results for input(s): CHOL, HDL, LDLCALC, TRIG, CHOLHDL, LDLDIRECT in the last 72 hours.  Thyroid Function Tests No results for input(s): TSH, T4TOTAL, T3FREE, THYROIDAB in the last 72 hours.  Invalid input(s): FREET3  Other  results:   Imaging    No results found.   Medications:     Scheduled Medications:  aspirin  81 mg Oral Daily   Chlorhexidine Gluconate Cloth  6 each Topical Daily   dextromethorphan-guaiFENesin  1 tablet Oral BID   digoxin  0.125 mg Oral Daily   feeding supplement  237 mL Oral BID BM   Living Better with Heart Failure Book   Does not apply Once   metolazone  2.5 mg Oral Once   multivitamin with minerals  1 tablet Oral Daily   nicotine  21 mg Transdermal QHS   pantoprazole  40 mg Oral Daily   rosuvastatin  40 mg Oral Daily   sodium chloride flush  10-40 mL Intracatheter Q12H   sodium chloride flush  3 mL Intravenous Q12H   sodium chloride flush  3 mL Intravenous Q12H   spironolactone  12.5 mg Oral Daily   Thrombi-Pad  1 each Topical Once   torsemide  40 mg Oral Daily    Infusions:  sodium chloride     sodium chloride Stopped (01/31/21 2357)   amiodarone 30 mg/hr (02/02/21 0600)   heparin 1,200 Units/hr (02/02/21 0600)   magnesium sulfate bolus IVPB     magnesium sulfate bolus IVPB     milrinone 0.375 mcg/kg/min (02/02/21 0601)   norepinephrine (LEVOPHED) Adult infusion Stopped (02/01/21 0111)    PRN Medications: sodium chloride, Place/Maintain arterial line **AND** sodium chloride, acetaminophen, ondansetron (ZOFRAN) IV, sodium chloride flush, sodium chloride flush    Assessment/Plan   1. Acute systolic CHF: Ischemic cardiomyopathy, symptoms worsened by severe MR.  I reviewed the echo done today, showing EF 35-40%, severe hypokinesis inferior and inferolateral walls, moderate LV dilation with mild LVH, mild RV dilation with moderately decreased systolic function, restricted posterior mitral leaflet and calcified mitral valve with severe mitral regurgitation and at least mild mitral stenosis (mean gradient 8 mmHg), PASP 60. Per report, echo at [facility] showed EF 55-60% with severe MR in 10/22.  It was a technically difficult study per report.  It is possible that he  had a new ACS event prior to admission (?occlusion of SVG-ramus) with reported drop in EF though HS-TnI with mild elevation and no trend suggests that it was not immediately prior to admission.  Low output noted with co-ox 39% initially, lactate elevated when he was initially admitted.  Currently on milrinone 0.375, am co-ox low again at 36%.  This is surprising given excellent diuresis and stable creatinine at 1.2.  CVP down to 5 today, weight down markedly.  - Continue milrinone  0.375 for now but send stat co-ox.  Increase milrinone to 0.5 if remains low.   - Add digoxin 0.125 daily.  - Stop Lasix gtt, start torsemide this evening.   - Continue spironolactone 12.5 daily.  - Hold ARB/ARNI with soft BP.   2. CAD: S/p CABG 2006.  As above, with fall in EF and CHF exacerbation, possible ACS prior to admission.  However, mild HS-TnI elevation with no trend suggests that ACS was not immediately prior to admission.  Current elevation is likely demand ischemia from volume overload. LHC on 11/14 showed patent LIMA-LAD with SVG-ramus occluded at aorta (does not look new); there was complex 80% proximal ramus stenosis.  Minimal RCA disease and LAD territory well-supplied by LIMA.  No chest pain.  - Continue heparin gtt while off Xarelto.  - ASA 81 - Crestor 40 - For now, would manage medically.  If chest pain develops, could consider intervention on native ramus.  3.  Mitral valve disease: I reviewed echo.  The posterior MV leaflet is restricted with severe MR, suspect primarily infarct-related MR given LV dilation and inferior/inferolateral severe hypokinesis.  However, the MV is also thickened and there is at least mild mitral stenosis (mean gradient 8 likely overestimates given severe MR with high flow).   - He will need a TEE today, discussed risks/benefits with patient and he agrees to procedure.   - Unlikely to be a good Mitraclip candidate with degree of MS.  Will need to consider options after TEE.  4.  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  ?due to sedentary lifestyle + ?genetic predisposition.   - Was on Xarelto PTA, now heparin gtt.  Restart Xarelto prior to discharge.  5. Smoking: I strongly encouraged him to quit.  6. PNA: WBCs 21 => 13 => 9, afebrile.  Culture NGTD.  RML PNA on CT chest.  - Currently on Augmentin, complete 5 days abx.  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  - PT.   Need to mobilize.   CRITICAL CARE Performed by: Loralie Champagne  Total critical care time: 35 minutes  Critical care time was exclusive of separately billable procedures and treating other patients.  Critical care was necessary to treat or prevent imminent or life-threatening deterioration.  Critical care was time spent personally by me on the following activities: development of treatment plan with patient and/or surrogate as well as nursing, discussions with consultants, evaluation of patient's response to treatment, examination of patient, obtaining history from patient or surrogate, ordering and performing treatments and interventions, ordering and review of laboratory studies, ordering and review of radiographic studies, pulse oximetry and re-evaluation of patient's condition.   Length of Stay: 6  Loralie Champagne, MD  02/02/2021, 7:21 AM  Advanced Heart Failure Team Pager [phone] (M-F; 7a - 5p)  Please contact [facility] for night-coverage after hours (5p -7a ) and weekends on [email or web address]

## 2021-02-02 NOTE — Interval H&P Note (Signed)
History and Physical Interval Note:  02/02/2021 12:46 PM  Mark Garrett  has presented today for surgery, with the diagnosis of mitral regurg.  The various methods of treatment have been discussed with the patient and family. After consideration of risks, benefits and other options for treatment, the patient has consented to  Procedure(s): TRANSESOPHAGEAL ECHOCARDIOGRAM (TEE) (N/A) as a surgical intervention.  The patient's history has been reviewed, patient examined, no change in status, stable for surgery.  I have reviewed the patient's chart and labs.  Questions were answered to the patient's satisfaction.     Morell Mears [employer]

## 2021-02-02 NOTE — Consult Note (Signed)
[facility]    Date of Admission:  01/27/2021     Total days of antibiotics 8               Reason for Consult: Endocarditis    Referring Provider: Aundra Dubin Primary Care Provider: Pcp, No   ASSESSMENT:  Mark Garrett is a 40 y/o caucasian male admitted with acute systolic heart failure and CT scan concerning for pneumonia. 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 Suspect this will likely be culture negative endocarditis as previous blood cultures finalized without growth. No clear sources of infection.  Awaiting evaluation by CVTS for possible mitral valve replacement. Agree with vancomycin and cefepime with consideration to narrow cefepime to ceftriaxone. Repeat blood cultures drawn on 02/02/21 are pending.  Remaining medical and supportive care per primary team.    PLAN:  Continue current dose of vancomycin and cefepime. Awaiting evaluation by CVTS for possible valve replacement. Monitor repeat cultures for clearance of bacteremia.  Remaining medical and supportive care per Primary Team ID will continue to follow.    Principal Problem:   Acute systolic CHF (congestive heart failure) (HCC) Active Problems:   CAP (community acquired pneumonia)   Pleural effusion on right   Leukocytosis   Microcytic anemia   NSTEMI (non-ST elevated myocardial infarction) (HCC)   DVT (deep venous thrombosis) (HCC)   Cigarette smoker   COPD (chronic obstructive pulmonary disease) (HCC)   Asthma   Severe mitral regurgitation   Serum total bilirubin elevated   Paroxysmal VT   Cardiogenic shock (HCC)    aspirin  81 mg Oral Daily   Chlorhexidine Gluconate Cloth  6 each Topical Daily   dextromethorphan-guaiFENesin  1 tablet Oral BID   digoxin  0.125 mg Oral Daily   feeding supplement  237 mL Oral BID BM   Living Better with Heart Failure Book   Does not apply Once   multivitamin with minerals  1 tablet Oral  Daily   nicotine  21 mg Transdermal QHS   pantoprazole  40 mg Oral Daily   rosuvastatin  40 mg Oral Daily   sodium chloride flush  10-40 mL Intracatheter Q12H   sodium chloride flush  3 mL Intravenous Q12H   sodium chloride flush  3 mL Intravenous Q12H   spironolactone  12.5 mg Oral Daily   Thrombi-Pad  1 each Topical Once   torsemide  40 mg Oral Daily     HPI: Mark Garrett is a 40 y.o. male with previous medical history of COPD/asthma, CAD s/p CABG in 2006, and DVT admitted with increasing shortness of breath and chest pain.   Mark Garrett began having increasing shortness of breath starting about 3 days prior to arrival with left sided throbbing chest pain starting 1 day prior to arrival. Found to have hypoxia on arrival with work up showing leukocytosis, hyponatremia, lactic acidosis,  and BNP 1733. Chest x-ray with patchy airspace opacities in the right lung. CT angio chest with no pulmonary embolism and right middle lobe and small bilateral lower lobe opacities concerning for pneumonia. Started on antibiotics for suspected pneumonia with Azithromycin and Ceftriaxone. US doppler of bilateral lower extremities with age indeterminate DVT. Echocardiogram with EF of 35-40% which was confirmed with TEE. There was the subsequent finding of mobile vegetation on both the anterior and posterior mitral valve leaflets with severe mitral regurgitation.   Mark Garrett has been afebrile since admission and leukocytosis has improved.  Has received 6 days of antibiotic therapy with antibiotics changed to vancomycin and cefepime following endocarditis findings. Blood cultures on admission finalized without growth. Completed 6 days of treatment for pneumonia.  Mark Garrett has lost approximately 100 lbs over the course of the last several months as he is currently living with nutrition assistance. No recent infection and denies antibiotics.   Review of Systems: Review of Systems  Constitutional:  Negative  for chills, fever and weight loss.  Respiratory:  Negative for cough, shortness of breath and wheezing.   Cardiovascular:  Negative for chest pain and leg swelling.  Gastrointestinal:  Negative for abdominal pain, constipation, diarrhea, nausea and vomiting.  Skin:  Negative for rash.    Past Medical History:  Diagnosis Date   COPD (chronic obstructive pulmonary disease) (HCC)    DVT (deep venous thrombosis) (HCC)     Social History   Tobacco Use   Smoking status: Every Day    Types: Cigarettes   Smokeless tobacco: Never  Vaping Use   Vaping Use: Never used    Family History  Problem Relation Age of Onset   CAD Mother    CAD Father     Allergies  Allergen Reactions   Iodide Rash    OBJECTIVE: Blood pressure 115/78, pulse 70, temperature (!) 97.4 F (36.3 C), temperature source Temporal, resp. rate 13, height 5\' 11"  (1.803 m), weight 70.9 kg, SpO2 92 %.  Physical Exam Constitutional:      General: He is not in acute distress.    Appearance: He is well-developed.  Cardiovascular:     Rate and Rhythm: Normal rate and regular rhythm.     Heart sounds: Normal heart sounds.  Pulmonary:     Effort: Pulmonary effort is normal.     Breath sounds: Normal breath sounds.  Skin:    General: Skin is warm and dry.  Neurological:     Mental Status: He is alert and oriented to person, place, and time.  Psychiatric:        Behavior: Behavior normal.        Thought Content: Thought content normal.        Judgment: Judgment normal.    Lab Results Lab Results  Component Value Date   WBC 12.1 (H) 02/02/2021   HGB 10.4 (L) 02/02/2021   HCT 36.9 (L) 02/02/2021   MCV 67.8 (L) 02/02/2021   PLT 193 02/02/2021    Lab Results  Component Value Date   CREATININE 1.22 02/02/2021   BUN 13 02/02/2021   NA 133 (L) 02/02/2021   K 3.9 02/02/2021   CL 88 (L) 02/02/2021   CO2 36 (H) 02/02/2021    Lab Results  Component Value Date   ALT 29 02/01/2021   AST 27 02/01/2021    ALKPHOS 88 02/01/2021   BILITOT 2.9 (H) 02/01/2021     Microbiology: Recent Results (from the past 240 hour(s))  Resp Panel by RT-PCR (Flu A&B, Covid) Nasopharyngeal Swab     Status: None   Collection Time: 01/27/21 10:09 AM   Specimen: Nasopharyngeal Swab; Nasopharyngeal(NP) swabs in vial transport medium  Result Value Ref Range Status   SARS Coronavirus 2 by RT PCR NEGATIVE NEGATIVE Final    Comment: (NOTE) SARS-CoV-2 target nucleic acids are NOT DETECTED.  The SARS-CoV-2 RNA is generally detectable in upper respiratory specimens during the acute phase of infection. The lowest concentration of SARS-CoV-2 viral copies this assay can detect is 138 copies/mL. A negative result does not preclude SARS-Cov-2 infection  and should not be used as the sole basis for treatment or other patient management decisions. A negative result may occur with  improper specimen collection/handling, submission of specimen other than nasopharyngeal swab, presence of viral mutation(s) within the areas targeted by this assay, and inadequate number of viral copies(<138 copies/mL). A negative result must be combined with clinical observations, patient history, and epidemiological information. The expected result is Negative.  Fact Sheet for Patients:  EntrepreneurPulse.com.au  Fact Sheet for Healthcare Providers:  IncredibleEmployment.be  This test is no t yet approved or cleared by the [place] FDA and  has been authorized for detection and/or diagnosis of SARS-CoV-2 by FDA under an Emergency Use Authorization (EUA). This EUA will remain  in effect (meaning this test can be used) for the duration of the COVID-19 declaration under Section 564(b)(1) of the Act, 21 U.S.C.section 360bbb-3(b)(1), unless the authorization is terminated  or revoked sooner.       Influenza A by PCR NEGATIVE NEGATIVE Final   Influenza B by PCR NEGATIVE NEGATIVE Final    Comment:  (NOTE) The Xpert Xpress SARS-CoV-2/FLU/RSV plus assay is intended as an aid in the diagnosis of influenza from Nasopharyngeal swab specimens and should not be used as a sole basis for treatment. Nasal washings and aspirates are unacceptable for Xpert Xpress SARS-CoV-2/FLU/RSV testing.  Fact Sheet for Patients: EntrepreneurPulse.com.au  Fact Sheet for Healthcare Providers: IncredibleEmployment.be  This test is not yet approved or cleared by the [place] FDA and has been authorized for detection and/or diagnosis of SARS-CoV-2 by FDA under an Emergency Use Authorization (EUA). This EUA will remain in effect (meaning this test can be used) for the duration of the COVID-19 declaration under Section 564(b)(1) of the Act, 21 U.S.C. section 360bbb-3(b)(1), unless the authorization is terminated or revoked.  Performed at [facility], [place] [employer] [place] [address] [place], [place] [address]   C Difficile Quick Screen w PCR reflex     Status: None   Collection Time: 01/27/21  8:47 PM   Specimen: STOOL  Result Value Ref Range Status   C Diff antigen NEGATIVE NEGATIVE Final   C Diff toxin NEGATIVE NEGATIVE Final   C Diff interpretation No C. difficile detected.  Final    Comment: Performed at [facility], [place] [address] [place], [place] [address]  Culture, blood (Routine X 2) w Reflex to ID Panel     Status: None   Collection Time: 01/27/21 10:59 PM   Specimen: BLOOD  Result Value Ref Range Status   Specimen Description BLOOD LEFT ANTECUBITAL  Final   Special Requests   Final    BOTTLES DRAWN AEROBIC AND ANAEROBIC Blood Culture adequate volume   Culture   Final    NO GROWTH 5 DAYS Performed at [facility], [place] [address] [place], [place] [address]    Report Status 02/01/2021 FINAL  Final  Culture, blood (Routine X 2) w Reflex to ID Panel     Status: None   Collection Time: 01/27/21 10:59 PM   Specimen: BLOOD LEFT HAND  Result Value  Ref Range Status   Specimen Description BLOOD LEFT HAND  Final   Special Requests   Final    BOTTLES DRAWN AEROBIC AND ANAEROBIC Blood Culture adequate volume   Culture   Final    NO GROWTH 5 DAYS Performed at [facility], [place] [address], [place],  [address]    Report Status 02/01/2021 FINAL  Final  MRSA Next Gen  by PCR, Nasal     Status: None   Collection Time: 01/31/21  7:04 PM   Specimen: Nasal Mucosa; Nasal Swab  Result Value Ref Range Status   MRSA by PCR Next Gen NOT DETECTED NOT DETECTED Final    Comment: (NOTE) The GeneXpert MRSA Assay (FDA approved for NASAL specimens only), is one component of a comprehensive MRSA colonization surveillance program. It is not intended to diagnose MRSA infection nor to guide or monitor treatment for MRSA infections. Test performance is not FDA approved in patients less than 47 years old. Performed at [facility], [place] [address], [place], [place] [address]      Terri Piedra, [facility]  02/02/2021  2:49 PM

## 2021-02-02 NOTE — Anesthesia Procedure Notes (Signed)
Procedure Name: MAC Date/Time: 02/02/2021 12:55 PM Performed by: Dorthea Cove, CRNA Pre-anesthesia Checklist: Patient identified, Emergency Drugs available, Suction available, Patient being monitored and Timeout performed Patient Re-evaluated:Patient Re-evaluated prior to induction Oxygen Delivery Method: Nasal cannula Preoxygenation: Pre-oxygenation with 100% oxygen Induction Type: IV induction Placement Confirmation: positive ETCO2 and CO2 detector Dental Injury: Teeth and Oropharynx as per pre-operative assessment

## 2021-02-02 NOTE — CV Procedure (Signed)
Procedure: TEE  Indication: Mitral regurgitation  Sedation: Per anesthesiology  Findings: Please see echo section for full report.  Mildly dilated left ventricle with mild LV hypertrophy.  EF 35-40%, inferolateral and inferior hypokinesis.  Normal RV size with mildly decreased systolic function.  Moderate left atrial enlargement, no LA appendage thrombus.  Moderate right atrial enlargement.  No PFO or ASD by color doppler.  Mild TR, peak RV-RA gradient 33 mmHg, no TV vegetation.  No PV vegetation.  Trileaflet aortic valve with mild AI, no AS, no vegetation.  There was mobile vegetation on both the anterior and posterior mitral leaflets.  The leaflets did not completely coapt (?destruction from endocarditis) with severe mitral regurgitation (ERO 0.56 cm^2).  There was flattening but not flow reversal in the pulmonary vein systolic doppler pattern. No significant stenosis.  Normal caliber thoracic aorta.  There was extensive plaque in the proximal descending thoracic aorta with what appears to be mobile vegetation attached to it.   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  Severe mitral regurgitation.   Marca Ancona 02/02/2021 1:24 PM

## 2021-02-02 NOTE — Transfer of Care (Signed)
Immediate Anesthesia Transfer of Care Note  Patient: Mark Garrett [facility]  Procedure(s) Performed: TRANSESOPHAGEAL ECHOCARDIOGRAM (TEE)  Patient Location: Endoscopy Unit  Anesthesia Type:MAC  Level of Consciousness: awake and drowsy  Airway & Oxygen Therapy: Patient Spontanous Breathing and Patient connected to nasal cannula oxygen  Post-op Assessment: Report given to RN and Post -op Vital signs reviewed and stable  Post vital signs: Reviewed and stable  Last Vitals:  Vitals Value Taken Time  BP 113/74 02/02/21 1320  Temp    Pulse 70 02/02/21 1324  Resp 12 02/02/21 1324  SpO2 94 % 02/02/21 1324  Vitals shown include unvalidated device data.  Last Pain:  Vitals:   02/02/21 1217  TempSrc: Temporal  PainSc: 0-No pain      Patients Stated Pain Goal: 0 (09/62/83 6629)  Complications: No notable events documented.

## 2021-02-02 NOTE — Anesthesia Postprocedure Evaluation (Signed)
Anesthesia Post Note  Patient: Mark Garrett [facility]  Procedure(s) Performed: TRANSESOPHAGEAL ECHOCARDIOGRAM (TEE)     Patient location during evaluation: Endoscopy Anesthesia Type: MAC Level of consciousness: awake and alert Pain management: pain level controlled Vital Signs Assessment: post-procedure vital signs reviewed and stable Respiratory status: spontaneous breathing, nonlabored ventilation and respiratory function stable Cardiovascular status: stable and blood pressure returned to baseline Postop Assessment: no apparent nausea or vomiting Anesthetic complications: no   No notable events documented.  Last Vitals:  Vitals:   02/02/21 1330 02/02/21 1340  BP: 116/73 115/78  Pulse: 71 70  Resp: 10 13  Temp:    SpO2: 94% 92%    Last Pain:  Vitals:   02/02/21 1340  TempSrc:   PainSc: 0-No pain                 Catalina Gravel

## 2021-02-02 NOTE — Anesthesia Preprocedure Evaluation (Signed)
Anesthesia Evaluation  Patient identified by MRN, date of birth, ID band Patient awake    Reviewed: Allergy & Precautions, NPO status , Patient's Chart, lab work & pertinent test results  Airway Mallampati: II  TM Distance: >3 FB Neck ROM: Full    Dental  (+) Dental Advisory Given, Poor Dentition   Pulmonary asthma , COPD, Current Smoker and Patient abstained from smoking.,    Pulmonary exam normal breath sounds clear to auscultation       Cardiovascular + Past MI, + CABG, +CHF and + DVT  Normal cardiovascular exam Rhythm:Regular Rate:Normal     Neuro/Psych negative neurological ROS     GI/Hepatic negative GI ROS, Neg liver ROS,   Endo/Other  negative endocrine ROS  Renal/GU negative Renal ROS     Musculoskeletal negative musculoskeletal ROS (+)   Abdominal   Peds  Hematology  (+) Blood dyscrasia, anemia ,   Anesthesia Other Findings Day of surgery medications reviewed with the patient.  Reproductive/Obstetrics                             Anesthesia Physical Anesthesia Plan  ASA: 3  Anesthesia Plan: MAC   Post-op Pain Management:    Induction: Intravenous  PONV Risk Score and Plan: 0 and Propofol infusion and Treatment may vary due to age or medical condition  Airway Management Planned: Natural Airway and Nasal Cannula  Additional Equipment:   Intra-op Plan:   Post-operative Plan:   Informed Consent: I have reviewed the patients History and Physical, chart, labs and discussed the procedure including the risks, benefits and alternatives for the proposed anesthesia with the patient or authorized representative who has indicated his/her understanding and acceptance.     Dental advisory given  Plan Discussed with: CRNA  Anesthesia Plan Comments:         Anesthesia Quick Evaluation

## 2021-02-02 NOTE — Progress Notes (Signed)
1615 - Report received from [place], [place].  All questions answered.  Safety checks performed.   Hand hygiene performed before/after each pt contact. Assessment and Rx. 1730 - Dinner arrived for pt.  Pt ate 100%. 1900 - Report given to Hilda Lias, Charity fundraiser.  All questions answered.

## 2021-02-02 NOTE — Progress Notes (Signed)
Physical Therapy Treatment Patient Details Name: Mark Garrett MRN: [id number] DOB: May 30, 1980 Today's Date: 02/02/2021   History of Present Illness Pt is a 40 y/o male admitted due to CHF exacerbation and underwent R/L heart cath and coronary graft angiography on 11/14. PMH: CHF, CAD (s/p CABG 2006), mitral valve disease, traumatic amputation of L foot.    PT Comments    Pt tolerates treatment well, demonstrating some improvement in balance this session despite ambulation without prosthetic device. Pt will benefit from continued aggressive mobilization to improve activity tolerance and restore independence.   Recommendations for follow up therapy are one component of a multi-disciplinary discharge planning process, led by the attending physician.  Recommendations may be updated based on patient status, additional functional criteria and insurance authorization.  Follow Up Recommendations  No PT follow up     Assistance Recommended at Discharge None  Equipment Recommendations  None recommended by PT    Recommendations for Other Services       Precautions / Restrictions Precautions Precautions: Fall;Other (comment) Precaution Comments: hx of L forefoot amputation (does not have prosthetic in room; ambulates with/without it at home) Restrictions Weight Bearing Restrictions: No     Mobility  Bed Mobility Overal bed mobility: Modified Independent             General bed mobility comments: increased time, assist for lines    Transfers Overall transfer level: Independent Equipment used: None Transfers: Sit to/from Stand Sit to Stand: Independent                Ambulation/Gait Ambulation/Gait assistance: Supervision Gait Distance (Feet): 250 Feet Assistive device: None Gait Pattern/deviations: Step-through pattern Gait velocity: reduced Gait velocity interpretation: 1.31 - 2.62 ft/sec, indicative of limited community ambulator   General Gait Details: pt  with slowed step-through gait, mild increased in sway however no LOB noted   Stairs             Wheelchair Mobility    Modified Rankin (Stroke Patients Only)       Balance Overall balance assessment: Needs assistance Sitting-balance support: No upper extremity supported;Feet supported Sitting balance-Leahy Scale: Good     Standing balance support: No upper extremity supported;During functional activity Standing balance-Leahy Scale: Good                              Cognition Arousal/Alertness: Awake/alert Behavior During Therapy: WFL for tasks assessed/performed Overall Cognitive Status: Within Functional Limits for tasks assessed                                          Exercises      General Comments General comments (skin integrity, edema, etc.): VSS on RA      Pertinent Vitals/Pain Pain Assessment: No/denies pain    Home Living                          Prior Function            PT Goals (current goals can now be found in the care plan section) Acute Rehab PT Goals Patient Stated Goal: return home Progress towards PT goals: Progressing toward goals    Frequency    Min 3X/week      PT Plan Current plan remains appropriate    Co-evaluation  AM-PAC PT "6 Clicks" Mobility   Outcome Measure  Help needed turning from your back to your side while in a flat bed without using bedrails?: None Help needed moving from lying on your back to sitting on the side of a flat bed without using bedrails?: None Help needed moving to and from a bed to a chair (including a wheelchair)?: None Help needed standing up from a chair using your arms (e.g., wheelchair or bedside chair)?: None Help needed to walk in hospital room?: A Little Help needed climbing 3-5 steps with a railing? : A Little 6 Click Score: 22    End of Session   Activity Tolerance: Patient tolerated treatment well Patient left: in  chair;with call bell/phone within reach Nurse Communication: Mobility status PT Visit Diagnosis: Unsteadiness on feet (R26.81);Muscle weakness (generalized) (M62.81)     Time: [id number] PT Time Calculation (min) (ACUTE ONLY): 16 min  Charges:  $Gait Training: 8-22 mins                     Zenaida Niece, PT, DPT Acute Rehabilitation Pager: [phone] Office [place] 02/02/2021, 12:13 PM

## 2021-02-02 NOTE — Progress Notes (Signed)
Patient ID: Mark Garrett, male   DOB: 1980-09-16, 40 y.o.   MRN: [id number]     Advanced Heart Failure Rounding Note  PCP-Cardiologist: None   Subjective:    This morning, he is on milrinone 0.375 with co-ox low at 36%.  CVP 5 on Lasix gtt 10 mg/hr.  Excellent diuresis yesterday with creatinine down to 1.22.  He is in NSR on amiodarone gtt with rare PVCs.   LHC/RHC:   Coronary Findings  Diagnostic Dominance: Right Left Anterior Descending  Mid LAD lesion is 95% stenosed.  Ramus Intermedius  Ramus-1 lesion is 50% stenosed.  Ramus-2 lesion is 80% stenosed.  Right Coronary Artery  Mid RCA lesion is 30% stenosed.  Graft To Ramus  Origin lesion is 100% stenosed.  LIMA Graft To Dist LAD  Intervention  No interventions have been documented. Right Heart  Right Heart Pressures RHC Procedural Findings: Hemodynamics (mmHg) on milrinone 0.375 RA mean 18 RV 69/20 PA 76/25, mean 44 PCWP mean 22, v-waves to 37 mmHg LV 100/23 AO 100/60  Oxygen saturations: PA 58% AO 97%  Cardiac Output (Fick) 5.33  Cardiac Index (Fick) 2.67 PVR 4.1 WU  Cardiac Output (Thermo) 4.89 Cardiac Index (Thermo) 2.45  PVR 4.5 WU  PAPI: 2.8    Objective:   Weight Range: 70.9 kg Body mass index is 21.8 kg/m.   Vital Signs:   Temp:  [97.6 F (36.4 C)-98 F (36.7 C)] 97.8 F (36.6 C) (11/15 2000) Pulse Rate:  [69-117] 74 (11/16 0600) Resp:  [12-24] 15 (11/16 0600) BP: (77-134)/(48-92) 122/91 (11/16 0600) SpO2:  [80 %-100 %] 96 % (11/16 0600) Weight:  [70.9 kg] 70.9 kg (11/16 0500) Last BM Date: 01/30/21  Weight change: Filed Weights   01/31/21 0433 02/01/21 0440 02/02/21 0500  Weight: 79.9 kg 78.6 kg 70.9 kg    Intake/Output:   Intake/Output Summary (Last 24 hours) at 02/02/2021 0721 Last data filed at 02/02/2021 0630 Gross per 24 hour  Intake 983.44 ml  Output 11400 ml  Net -10416.56 ml      Physical Exam    General: NAD Neck: No JVD, no thyromegaly or thyroid  nodule.  Lungs: Clear to auscultation bilaterally with normal respiratory effort. CV: Nondisplaced PMI.  Heart regular S1/S2, no S3/S4, 3/6 HSM apex.  No peripheral edema.  No carotid bruit.  Normal pedal pulses.  Abdomen: Soft, nontender, no hepatosplenomegaly, no distention.  Skin: Intact without lesions or rashes.  Neurologic: Alert and oriented x 3.  Psych: Normal affect. Extremities: No clubbing or cyanosis. S/p left foot traumatic amputation.  HEENT: Normal.    Telemetry   NSR, no further NSVT overnight. Personally reviewed  Labs    CBC Recent Labs    02/01/21 0344 02/02/21 0500  WBC 9.8 12.1*  HGB 8.9* 10.4*  HCT 31.4* 36.9*  MCV 68.4* 67.8*  PLT 153 0000000   Basic Metabolic Panel Recent Labs    01/30/21 1442 01/31/21 0620 02/01/21 0344 02/02/21 0500  NA 135   < > 130* 133*  K 2.9*   < > 3.9 3.9  CL 88*   < > 89* 88*  CO2 33*   < > 33* 36*  GLUCOSE 119*   < > 137* 88  BUN 37*   < > 25* 13  CREATININE 1.18   < > 1.21 1.22  CALCIUM 8.1*   < > 8.1* 9.2  MG 2.6*  --   --  1.8   < > = values in this interval not  displayed.   Liver Function Tests Recent Labs    02/01/21 0344  AST 27  ALT 29  ALKPHOS 88  BILITOT 2.9*  PROT 5.0*  ALBUMIN 2.7*   No results for input(s): LIPASE, AMYLASE in the last 72 hours. Cardiac Enzymes No results for input(s): CKTOTAL, CKMB, CKMBINDEX, TROPONINI in the last 72 hours.  BNP: BNP (last 3 results) Recent Labs    01/27/21 1034  BNP 1,733.0*    ProBNP (last 3 results) No results for input(s): PROBNP in the last 8760 hours.   D-Dimer No results for input(s): DDIMER in the last 72 hours. Hemoglobin A1C No results for input(s): HGBA1C in the last 72 hours. Fasting Lipid Panel No results for input(s): CHOL, HDL, LDLCALC, TRIG, CHOLHDL, LDLDIRECT in the last 72 hours.  Thyroid Function Tests No results for input(s): TSH, T4TOTAL, T3FREE, THYROIDAB in the last 72 hours.  Invalid input(s): FREET3  Other  results:   Imaging    No results found.   Medications:     Scheduled Medications:  aspirin  81 mg Oral Daily   Chlorhexidine Gluconate Cloth  6 each Topical Daily   dextromethorphan-guaiFENesin  1 tablet Oral BID   digoxin  0.125 mg Oral Daily   feeding supplement  237 mL Oral BID BM   Living Better with Heart Failure Book   Does not apply Once   metolazone  2.5 mg Oral Once   multivitamin with minerals  1 tablet Oral Daily   nicotine  21 mg Transdermal QHS   pantoprazole  40 mg Oral Daily   rosuvastatin  40 mg Oral Daily   sodium chloride flush  10-40 mL Intracatheter Q12H   sodium chloride flush  3 mL Intravenous Q12H   sodium chloride flush  3 mL Intravenous Q12H   spironolactone  12.5 mg Oral Daily   Thrombi-Pad  1 each Topical Once   torsemide  40 mg Oral Daily    Infusions:  sodium chloride     sodium chloride Stopped (01/31/21 2357)   amiodarone 30 mg/hr (02/02/21 0600)   heparin 1,200 Units/hr (02/02/21 0600)   magnesium sulfate bolus IVPB     magnesium sulfate bolus IVPB     milrinone 0.375 mcg/kg/min (02/02/21 0601)   norepinephrine (LEVOPHED) Adult infusion Stopped (02/01/21 0111)    PRN Medications: sodium chloride, Place/Maintain arterial line **AND** sodium chloride, acetaminophen, ondansetron (ZOFRAN) IV, sodium chloride flush, sodium chloride flush    Assessment/Plan   1. Acute systolic CHF: Ischemic cardiomyopathy, symptoms worsened by severe MR.  I reviewed the echo done today, showing EF 35-40%, severe hypokinesis inferior and inferolateral walls, moderate LV dilation with mild LVH, mild RV dilation with moderately decreased systolic function, restricted posterior mitral leaflet and calcified mitral valve with severe mitral regurgitation and at least mild mitral stenosis (mean gradient 8 mmHg), PASP 60. Per report, echo at [facility] showed EF 55-60% with severe MR in 10/22.  It was a technically difficult study per report.  It is possible that he  had a new ACS event prior to admission (?occlusion of SVG-ramus) with reported drop in EF though HS-TnI with mild elevation and no trend suggests that it was not immediately prior to admission.  Low output noted with co-ox 39% initially, lactate elevated when he was initially admitted.  Currently on milrinone 0.375, am co-ox low again at 36%.  This is surprising given excellent diuresis and stable creatinine at 1.2.  CVP down to 5 today, weight down markedly.  - Continue milrinone  0.375 for now but send stat co-ox.  Increase milrinone to 0.5 if remains low.   - Add digoxin 0.125 daily.  - Stop Lasix gtt, start torsemide this evening.   - Continue spironolactone 12.5 daily.  - Hold ARB/ARNI with soft BP.   2. CAD: S/p CABG 2006.  As above, with fall in EF and CHF exacerbation, possible ACS prior to admission.  However, mild HS-TnI elevation with no trend suggests that ACS was not immediately prior to admission.  Current elevation is likely demand ischemia from volume overload. LHC on 11/14 showed patent LIMA-LAD with SVG-ramus occluded at aorta (does not look new); there was complex 80% proximal ramus stenosis.  Minimal RCA disease and LAD territory well-supplied by LIMA.  No chest pain.  - Continue heparin gtt while off Xarelto.  - ASA 81 - Crestor 40 - For now, would manage medically.  If chest pain develops, could consider intervention on native ramus.  3.  Mitral valve disease: I reviewed echo.  The posterior MV leaflet is restricted with severe MR, suspect primarily infarct-related MR given LV dilation and inferior/inferolateral severe hypokinesis.  However, the MV is also thickened and there is at least mild mitral stenosis (mean gradient 8 likely overestimates given severe MR with high flow).   - He will need a TEE today, discussed risks/benefits with patient and he agrees to procedure.   - Unlikely to be a good Mitraclip candidate with degree of MS.  Will need to consider options after TEE.  4.  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  ?due to sedentary lifestyle + ?genetic predisposition.   - Was on Xarelto PTA, now heparin gtt.  Restart Xarelto prior to discharge.  5. Smoking: I strongly encouraged him to quit.  6. PNA: WBCs 21 => 13 => 9, afebrile.  Culture NGTD.  RML PNA on CT chest.  - Currently on Augmentin, complete 5 days abx.  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  - PT.   Need to mobilize.   CRITICAL CARE Performed by: Loralie Champagne  Total critical care time: 35 minutes  Critical care time was exclusive of separately billable procedures and treating other patients.  Critical care was necessary to treat or prevent imminent or life-threatening deterioration.  Critical care was time spent personally by me on the following activities: development of treatment plan with patient and/or surrogate as well as nursing, discussions with consultants, evaluation of patient's response to treatment, examination of patient, obtaining history from patient or surrogate, ordering and performing treatments and interventions, ordering and review of laboratory studies, ordering and review of radiographic studies, pulse oximetry and re-evaluation of patient's condition.   Length of Stay: 6  Loralie Champagne, MD  02/02/2021, 7:21 AM  Advanced Heart Failure Team Pager [phone] (M-F; 7a - 5p)  Please contact [facility] for night-coverage after hours (5p -7a ) and weekends on [email or web address]

## 2021-02-02 NOTE — Progress Notes (Addendum)
Pharmacy Antibiotic Note  CLAYBURN WEEKLY is a 40 y.o. male admitted on 01/27/2021 with acute exacerbation of heart failure now with suspected culture negative endocarditis.  Pharmacy has been consulted for cefepime and vancomycin dosing.  Preliminary TEE reading suggestive of mitral valve endocarditis. WBC 12.1, afebrile SCr 1.22 - at baseline  Plan: Give vancomycin 1500 mg IV x1 Vancomycin 1000 mg IV every 12 hours.  Goal trough 15-20 mcg/mL.    > eAUC 559.3, SCr used 1.22, Cmax 34.5 Cefepime 2 gm IV every 8 hours Obtain vancomycin levels as indicated Monitor renal function, CBC, cultures/sensitivities, LOT and de-escalate as able  Temp (24hrs), Avg:97.7 F (36.5 C), Min:97.4 F (36.3 C), Max:97.9 F (36.6 C)  Recent Labs  Lab 01/27/21 1453 01/27/21 1742 01/27/21 2259 01/28/21 2220 01/30/21 0456 01/30/21 1442 01/30/21 2301 01/31/21 0620 01/31/21 0900 01/31/21 1526 02/01/21 0344 02/02/21 0500  WBC  --   --  5.6   < > 21.3*  --   --  13.0*  --  8.8 9.8 12.1*  CREATININE  --   --  1.30*   < > 1.39* 1.18  --  1.39* 1.36* 1.15 1.21 1.22  LATICACIDVEN 3.8* 2.5* 3.1*  --   --  3.0* 2.4*  --   --   --   --   --    < > = values in this interval not displayed.    Estimated Creatinine Clearance: 79.1 mL/min (by C-G formula based on SCr of 1.22 mg/dL).    Allergies  Allergen Reactions   Iodide Rash    Antimicrobials this admission: Vancomycin 11/16 > Cefepime 11/16 >  Augmentin 11/11 > 11/15 (CAP treatment) Ceftriaxone, Azithromycin 11/10 x1   Dose adjustments this admission: none  Microbiology results: 11/16 BCx: sent 11/10 BCx: NGF 11/14 MRSA PCR: negative  Thank you for allowing pharmacy to be a part of this patient's care.  Filbert Schilder, PharmD PGY1 Pharmacy Resident 02/02/2021  3:35 PM  Please check [email or web address] for unit-specific pharmacy phone numbers.

## 2021-02-02 NOTE — Plan of Care (Signed)
  Problem: Education: Goal: Knowledge of General Education information will improve Description: Including pain rating scale, medication(s)/side effects and non-pharmacologic comfort measures Outcome: Progressing   Problem: Health Behavior/Discharge Planning: Goal: Ability to manage health-related needs will improve Outcome: Progressing   Problem: Clinical Measurements: Goal: Ability to maintain clinical measurements within normal limits will improve Outcome: Progressing   Problem: Coping: Goal: Level of anxiety will decrease Outcome: Progressing   Problem: Elimination: Goal: Will not experience complications related to urinary retention Outcome: Progressing   Problem: Pain Managment: Goal: General experience of comfort will improve Outcome: Progressing   Problem: Safety: Goal: Ability to remain free from injury will improve Outcome: Progressing

## 2021-02-02 NOTE — Progress Notes (Signed)
  Echocardiogram Echocardiogram Transesophageal has been performed.  Mark Garrett F 02/02/2021, 2:18 PM

## 2021-02-03 ENCOUNTER — Inpatient Hospital Stay (HOSPITAL_COMMUNITY): Payer: Self-pay

## 2021-02-03 ENCOUNTER — Inpatient Hospital Stay (HOSPITAL_COMMUNITY): Payer: Medicaid Other

## 2021-02-03 DIAGNOSIS — K08109 Complete loss of teeth, unspecified cause, unspecified class: Secondary | ICD-10-CM

## 2021-02-03 DIAGNOSIS — I255 Ischemic cardiomyopathy: Secondary | ICD-10-CM

## 2021-02-03 DIAGNOSIS — K036 Deposits [accretions] on teeth: Secondary | ICD-10-CM

## 2021-02-03 DIAGNOSIS — I059 Rheumatic mitral valve disease, unspecified: Secondary | ICD-10-CM

## 2021-02-03 DIAGNOSIS — K029 Dental caries, unspecified: Secondary | ICD-10-CM

## 2021-02-03 DIAGNOSIS — I340 Nonrheumatic mitral (valve) insufficiency: Secondary | ICD-10-CM

## 2021-02-03 DIAGNOSIS — I2510 Atherosclerotic heart disease of native coronary artery without angina pectoris: Secondary | ICD-10-CM

## 2021-02-03 DIAGNOSIS — K030 Excessive attrition of teeth: Secondary | ICD-10-CM

## 2021-02-03 DIAGNOSIS — K0530 Chronic periodontitis, unspecified: Secondary | ICD-10-CM

## 2021-02-03 DIAGNOSIS — K045 Chronic apical periodontitis: Secondary | ICD-10-CM

## 2021-02-03 DIAGNOSIS — E43 Unspecified severe protein-calorie malnutrition: Secondary | ICD-10-CM | POA: Insufficient documentation

## 2021-02-03 DIAGNOSIS — J449 Chronic obstructive pulmonary disease, unspecified: Secondary | ICD-10-CM

## 2021-02-03 DIAGNOSIS — Z7901 Long term (current) use of anticoagulants: Secondary | ICD-10-CM

## 2021-02-03 DIAGNOSIS — K083 Retained dental root: Secondary | ICD-10-CM

## 2021-02-03 DIAGNOSIS — Z01818 Encounter for other preprocedural examination: Secondary | ICD-10-CM

## 2021-02-03 LAB — CBC
HCT: 37.6 % — ABNORMAL LOW (ref 39.0–52.0)
Hemoglobin: 10.9 g/dL — ABNORMAL LOW (ref 13.0–17.0)
MCH: 19.7 pg — ABNORMAL LOW (ref 26.0–34.0)
MCHC: 29 g/dL — ABNORMAL LOW (ref 30.0–36.0)
MCV: 68.1 fL — ABNORMAL LOW (ref 80.0–100.0)
Platelets: 224 10*3/uL (ref 150–400)
RBC: 5.52 MIL/uL (ref 4.22–5.81)
RDW: 24.2 % — ABNORMAL HIGH (ref 11.5–15.5)
WBC: 13.4 10*3/uL — ABNORMAL HIGH (ref 4.0–10.5)
nRBC: 1 % — ABNORMAL HIGH (ref 0.0–0.2)

## 2021-02-03 LAB — PULMONARY FUNCTION TEST
FEF 25-75 Pre: 1.04 L/sec
FEF2575-%Pred-Pre: 26 %
FEV1-%Pred-Pre: 50 %
FEV1-Pre: 2.18 L
FEV1FVC-%Pred-Pre: 75 %
FEV6-%Pred-Pre: 64 %
FEV6-Pre: 3.42 L
FEV6FVC-%Pred-Pre: 97 %
FVC-%Pred-Pre: 66 %
FVC-Pre: 3.63 L
Pre FEV1/FVC ratio: 60 %
Pre FEV6/FVC Ratio: 95 %

## 2021-02-03 LAB — BASIC METABOLIC PANEL
Anion gap: 9 (ref 5–15)
BUN: 16 mg/dL (ref 6–20)
CO2: 32 mmol/L (ref 22–32)
Calcium: 8.9 mg/dL (ref 8.9–10.3)
Chloride: 91 mmol/L — ABNORMAL LOW (ref 98–111)
Creatinine, Ser: 1.27 mg/dL — ABNORMAL HIGH (ref 0.61–1.24)
GFR, Estimated: >60 mL/min (ref >60)
Glucose, Bld: 88 mg/dL (ref 70–99)
Potassium: 3.7 mmol/L (ref 3.5–5.1)
Sodium: 132 mmol/L — ABNORMAL LOW (ref 135–145)

## 2021-02-03 LAB — PHOSPHORUS: Phosphorus: 3.7 mg/dL (ref 2.5–4.6)

## 2021-02-03 LAB — COOXEMETRY PANEL
Carboxyhemoglobin: 2.1 % — ABNORMAL HIGH (ref 0.5–1.5)
Methemoglobin: 0.8 % (ref 0.0–1.5)
O2 Saturation: 62.4 %
Total hemoglobin: 10.9 g/dL — ABNORMAL LOW (ref 12.0–16.0)

## 2021-02-03 LAB — HEPARIN LEVEL (UNFRACTIONATED): Heparin Unfractionated: 0.47 IU/mL (ref 0.30–0.70)

## 2021-02-03 LAB — HIV ANTIBODY (ROUTINE TESTING W REFLEX): HIV Screen 4th Generation wRfx: NONREACTIVE

## 2021-02-03 LAB — MAGNESIUM: Magnesium: 2.1 mg/dL (ref 1.7–2.4)

## 2021-02-03 LAB — GLUCOSE, CAPILLARY: Glucose-Capillary: 122 mg/dL — ABNORMAL HIGH (ref 70–99)

## 2021-02-03 MED ORDER — ENSURE ENLIVE PO LIQD
237.0000 mL | Freq: Three times a day (TID) | ORAL | Status: DC
  Administered 2021-02-03 – 2021-02-16 (×32): 237 mL via ORAL

## 2021-02-03 MED ORDER — POTASSIUM CHLORIDE CRYS ER 20 MEQ PO TBCR
40.0000 meq | EXTENDED_RELEASE_TABLET | Freq: Once | ORAL | Status: AC
  Administered 2021-02-03: 07:00:00 40 meq via ORAL
  Filled 2021-02-03: qty 2

## 2021-02-03 MED ORDER — TORSEMIDE 20 MG PO TABS
20.0000 mg | ORAL_TABLET | Freq: Every day | ORAL | Status: DC
  Administered 2021-02-03: 08:00:00 20 mg via ORAL

## 2021-02-03 NOTE — Progress Notes (Signed)
Patient ID: Mark Garrett, male   DOB: 10-Dec-1980, 40 y.o.   MRN: [id number] Patient ID: Mark Garrett, male   DOB: Nov 09, 1980, 40 y.o.   MRN: [id number]     Advanced Heart Failure Rounding Note  PCP-Cardiologist: None   Subjective:    This morning, he is on milrinone 0.375 with co-ox 62%.  CVP 3-4.  Creatinine 1.27.   He is in NSR on amiodarone gtt with rare PVCs.   TEE (11/16): EF 35-40%, there was mobile vegetation on both the anterior and posterior mitral leaflets.  The leaflets did not completely coapt (?destruction from endocarditis) with severe mitral regurgitation (ERO 0.56 cm^2).  There was extensive plaque in the proximal descending thoracic aorta with what appears to be mobile vegetation attached to it.   LHC/RHC:   Coronary Findings  Diagnostic Dominance: Right Left Anterior Descending  Mid LAD lesion is 95% stenosed.  Ramus Intermedius  Ramus-1 lesion is 50% stenosed.  Ramus-2 lesion is 80% stenosed.  Right Coronary Artery  Mid RCA lesion is 30% stenosed.  Graft To PLV  Origin lesion is 100% stenosed.  LIMA Graft To Dist LAD  Intervention  No interventions have been documented. Right Heart  Right Heart Pressures RHC Procedural Findings: Hemodynamics (mmHg) on milrinone 0.375 RA mean 18 RV 69/20 PA 76/25, mean 44 PCWP mean 22, v-waves to 37 mmHg LV 100/23 AO 100/60  Oxygen saturations: PA 58% AO 97%  Cardiac Output (Fick) 5.33  Cardiac Index (Fick) 2.67 PVR 4.1 WU  Cardiac Output (Thermo) 4.89 Cardiac Index (Thermo) 2.45  PVR 4.5 WU  PAPI: 2.8    Objective:   Weight Range: 68.9 kg Body mass index is 21.19 kg/m.   Vital Signs:   Temp:  [97.4 F (36.3 C)-97.8 F (36.6 C)] 97.8 F (36.6 C) (11/17 0300) Pulse Rate:  [70-88] 84 (11/17 0700) Resp:  [10-23] 14 (11/17 0700) BP: (93-134)/(57-93) 120/81 (11/17 0600) SpO2:  [92 %-99 %] 99 % (11/17 0700) Weight:  [68.9 kg-70.9 kg] 68.9 kg (11/17 0500) Last BM Date: 01/30/21  Weight  change: Filed Weights   02/02/21 0500 02/02/21 1217 02/03/21 0500  Weight: 70.9 kg 70.9 kg 68.9 kg    Intake/Output:   Intake/Output Summary (Last 24 hours) at 02/03/2021 0812 Last data filed at 02/03/2021 0700 Gross per 24 hour  Intake 2280.1 ml  Output 3725 ml  Net -1444.9 ml      Physical Exam    General: NAD Neck: No JVD, no thyromegaly or thyroid nodule.  Lungs: Clear to auscultation bilaterally with normal respiratory effort. CV: Nondisplaced PMI.  Heart regular S1/S2, no S3/S4, 2/6 HSM apex.  No peripheral edema.   Abdomen: Soft, nontender, no hepatosplenomegaly, no distention.  Skin: Intact without lesions or rashes.  Neurologic: Alert and oriented x 3.  Psych: Normal affect. Extremities: No clubbing or cyanosis.  HEENT: Normal.    Telemetry   NSR, no further NSVT overnight. Personally reviewed  Labs    CBC Recent Labs    02/02/21 0500 02/03/21 0405  WBC 12.1* 13.4*  HGB 10.4* 10.9*  HCT 36.9* 37.6*  MCV 67.8* 68.1*  PLT 193 416   Basic Metabolic Panel Recent Labs    02/02/21 0500 02/03/21 0405  NA 133* 132*  K 3.9 3.7  CL 88* 91*  CO2 36* 32  GLUCOSE 88 88  BUN 13 16  CREATININE 1.22 1.27*  CALCIUM 9.2 8.9  MG 1.8 2.1   Liver Function Tests Recent Labs    02/01/21  0344  AST 27  ALT 29  ALKPHOS 88  BILITOT 2.9*  PROT 5.0*  ALBUMIN 2.7*   No results for input(s): LIPASE, AMYLASE in the last 72 hours. Cardiac Enzymes No results for input(s): CKTOTAL, CKMB, CKMBINDEX, TROPONINI in the last 72 hours.  BNP: BNP (last 3 results) Recent Labs    01/27/21 1034  BNP 1,733.0*    ProBNP (last 3 results) No results for input(s): PROBNP in the last 8760 hours.   D-Dimer No results for input(s): DDIMER in the last 72 hours. Hemoglobin A1C No results for input(s): HGBA1C in the last 72 hours. Fasting Lipid Panel No results for input(s): CHOL, HDL, LDLCALC, TRIG, CHOLHDL, LDLDIRECT in the last 72 hours.  Thyroid Function  Tests No results for input(s): TSH, T4TOTAL, T3FREE, THYROIDAB in the last 72 hours.  Invalid input(s): FREET3  Other results:   Imaging    DG Orthopantogram  Result Date: 02/02/2021 CLINICAL DATA:  Possible surgical patient with endocarditis and broken teeth EXAM: ORTHOPANTOGRAM/PANORAMIC COMPARISON:  None. FINDINGS: Multiple dental amalgams. No periapical lucency. Large carie of tooth 2. Fragmented or partially extracted most posterior left mandibular molar. IMPRESSION: 1. Multiple dental amalgams. No periapical lucency. 2. Large carie of most posterior remaining right maxillary molar. 3. Fragmented or partially extracted most posterior left mandibular molar. Correlate with direct visualization. Electronically Signed   By: Ulyses Jarred M.D.   On: 02/02/2021 20:26   CT HEAD WO CONTRAST (5MM)  Result Date: 02/02/2021 CLINICAL DATA:  TIA EXAM: CT HEAD WITHOUT CONTRAST TECHNIQUE: Contiguous axial images were obtained from the base of the skull through the vertex without intravenous contrast. COMPARISON:  December 06, 2020 FINDINGS: Brain: There is no acute intracranial hemorrhage, mass effect, or edema. No new loss of gray-white differentiation. Chronic right frontoparietal infarcts. Small chronic cerebellar infarcts. Additional patchy low-density in the supratentorial white matter is nonspecific but probably reflects stable chronic microvascular ischemic changes. There is no extra-axial fluid collection. Ventricles and sulci are stable in size and configuration. Vascular: No hyperdense vessel or unexpected calcification. Skull: Calvarium is unremarkable. Sinuses/Orbits: No acute finding. Other: None. IMPRESSION: No acute intracranial hemorrhage or evidence of acute infarction. Stable chronic/nonemergent findings detailed above. Electronically Signed   By: Macy Mis M.D.   On: 02/02/2021 15:57   ECHO TEE  Result Date: 02/02/2021    TRANSESOPHOGEAL ECHO REPORT   Patient Name:   Mark Garrett Date of Exam: 02/02/2021 Medical Rec #:  [id number]       Height:       71.0 in Accession #:    [id number]      Weight:       156.3 lb Date of Birth:  Dec 11, 1980        BSA:          1.899 m Patient Age:    69 years        BP:           115/78 mmHg Patient Gender: M               HR:           74 bpm. Exam Location:  Inpatient Procedure: Cardiac Doppler, Color Doppler, 3D Echo and Transesophageal Echo Indications:     Bacteremia  History:         Patient has prior history of Echocardiogram examinations, most                  recent 01/28/2021.  Sonographer:  Merrie Roof RDCS Referring Phys:  [place] Diagnosing Phys: Kirk Ruths McleanMD PROCEDURE: After discussion of the risks and benefits of a TEE, an informed consent was obtained from the patient. The transesophogeal probe was passed without difficulty through the esophogus of the patient. Local oropharyngeal anesthetic was provided with Cetacaine. Sedation performed by different physician. The patient was monitored while under deep sedation. The patient's vital signs; including heart rate, blood pressure, and oxygen saturation; remained stable throughout the procedure. The patient developed no complications during the procedure. IMPRESSIONS  1. Left ventricular ejection fraction, by estimation, is 35 to 40%. The left ventricle has moderately decreased function. The left ventricle demonstrates regional wall motion abnormalities with inferior and inferolateral hypokinesis. The left ventricular internal cavity size was mildly dilated.  2. Right ventricular systolic function is mildly reduced. The right ventricular size is normal.  3. Peak RV-RA gradient 33 mmHg. No TV vegetation.  4. No pulmonary valve vegetation.  5. The aortic valve is tricuspid. Aortic valve regurgitation is trivial. No aortic stenosis is present. No AoV vegetation.  6. Left atrial size was moderately dilated. No left atrial/left atrial appendage thrombus was detected.  7. Right  atrial size was moderately dilated.  8. No PFO or ASD by color doppler.  9. There was mobile vegetation on both the anterior and posterior mitral leaflets. The leaflets did not completely coapt (?destruction from endocarditis) with severe mitral regurgitation (ERO 0.56 cm^2). There was flattening but not flow reversal in the  pulmonary vein systolic doppler pattern. No significant stenosis. 10. There was extensive plaque in the proximal descending thoracic aorta with what appears to be mobile vegetation attached to it. FINDINGS  Left Ventricle: Left ventricular ejection fraction, by estimation, is 35 to 40%. The left ventricle has moderately decreased function. The left ventricle demonstrates regional wall motion abnormalities. The left ventricular internal cavity size was mildly dilated. There is no left ventricular hypertrophy. Right Ventricle: The right ventricular size is normal. No increase in right ventricular wall thickness. Right ventricular systolic function is mildly reduced. Left Atrium: Left atrial size was moderately dilated. No left atrial/left atrial appendage thrombus was detected. Right Atrium: Right atrial size was moderately dilated. Pericardium: There is no evidence of pericardial effusion. Mitral Valve: There was mobile vegetation on both the anterior and posterior mitral leaflets. The leaflets did not completely coapt (?destruction from endocarditis) with severe mitral regurgitation (ERO 0.56 cm^2). There was flattening but not flow reversal in the pulmonary vein systolic doppler pattern. No significant stenosis. The mitral valve is abnormal. Severe mitral valve regurgitation. Tricuspid Valve: Peak RV-RA gradient 33 mmHg. The tricuspid valve is normal in structure. Tricuspid valve regurgitation is mild. Aortic Valve: The aortic valve is tricuspid. Aortic valve regurgitation is trivial. No aortic stenosis is present. Pulmonic Valve: The pulmonic valve was normal in structure. Pulmonic valve  regurgitation is not visualized. Aorta: There was extensive plaque in the proximal descending thoracic aorta with what appears to be mobile vegetation attached to it. The aortic root is normal in size and structure. IAS/Shunts: No PFO or ASD by color doppler.  MR Peak grad:    94.1 mmHg MR Mean grad:    54.0 mmHg MR Vmax:         485.00 cm/s MR Vmean:        333.0 cm/s MR PISA:         9.05 cm MR PISA Eff ROA: 56 mm MR PISA Radius:  1.20 cm [employer] Electronically signed by  Ava Deguire [employer] Signature Date/Time: 02/02/2021/6:12:57 PM    Final      Medications:     Scheduled Medications:  aspirin  81 mg Oral Daily   Chlorhexidine Gluconate Cloth  6 each Topical Daily   dextromethorphan-guaiFENesin  1 tablet Oral BID   digoxin  0.125 mg Oral Daily   feeding supplement  237 mL Oral BID BM   Living Better with Heart Failure Book   Does not apply Once   multivitamin with minerals  1 tablet Oral Daily   nicotine  21 mg Transdermal QHS   pantoprazole  40 mg Oral Daily   rosuvastatin  40 mg Oral Daily   sodium chloride flush  10-40 mL Intracatheter Q12H   sodium chloride flush  3 mL Intravenous Q12H   sodium chloride flush  3 mL Intravenous Q12H   spironolactone  12.5 mg Oral Daily   Thrombi-Pad  1 each Topical Once   torsemide  20 mg Oral Daily    Infusions:  sodium chloride     sodium chloride Stopped (01/31/21 2357)   amiodarone 30 mg/hr (02/03/21 0507)   ceFEPime (MAXIPIME) IV 2 g (02/03/21 0525)   heparin 1,200 Units/hr (02/03/21 0507)   magnesium sulfate bolus IVPB     milrinone 0.375 mcg/kg/min (02/03/21 0507)   norepinephrine (LEVOPHED) Adult infusion Stopped (02/02/21 1739)   vancomycin      PRN Medications: sodium chloride, Place/Maintain arterial line **AND** sodium chloride, acetaminophen, ondansetron (ZOFRAN) IV, sodium chloride flush, sodium chloride flush    Assessment/Plan   1. Acute systolic CHF: Ischemic cardiomyopathy, symptoms worsened by severe MR.  I  reviewed the echo done today, showing EF 35-40%, severe hypokinesis inferior and inferolateral walls, moderate LV dilation with mild LVH, mild RV dilation with moderately decreased systolic function, restricted posterior mitral leaflet and calcified mitral valve with severe mitral regurgitation and at least mild mitral stenosis (mean gradient 8 mmHg), PASP 60. Per report, echo at [facility] showed EF 55-60% with severe MR in 10/22.  It was a technically difficult study per report.  It is possible that he had a new ACS event prior to admission with reported drop in EF though HS-TnI with mild elevation and no trend suggests that it was not immediately prior to admission.  Low output noted with co-ox 39% initially, lactate elevated when he was initially admitted.  Currently on milrinone 0.375, am co-ox 62%.  CVP now 3-4. - Continue milrinone 0.375 in setting of severe MR.   - Continue digoxin 0.125 daily.  - Decrease torsemide to 20 mg daily.    - Continue spironolactone 12.5 daily.  - Hold ARB/ARNI with soft BP.   2. CAD: S/p CABG 2006.  As above, with fall in EF and CHF exacerbation, possible ACS prior to admission.  However, mild HS-TnI elevation with no trend suggests that ACS was not immediately prior to admission.  Current elevation is likely demand ischemia from volume overload. LHC on 11/14 showed patent LIMA-LAD with SVG occluded at aorta (does not look new); there was complex 80% proximal ramus stenosis.  Initially assumed that the SVG was to the ramus, but was subsequently got the CABG operative report from [place] and it looks like it was an SVG-PLV.  There was minimal native RCA disease. LAD territory well-supplied by LIMA.  No chest pain.  - Continue heparin gtt while off Xarelto.  - ASA 81 - Crestor 40 - For now, would manage medically.  If chest pain develops, could consider intervention on native  ramus.  3.  Mitral valve disease: Initial echo showed posterior MV leaflet restricted with  severe MR, suspected primarily infarct-related MR given LV dilation and inferior/inferolateral severe hypokinesis.  However, TEE on 11/16 showed vegetation (not bulky but clearly present) on the posterior and anterior leaflets with poor leaflet coaptation, suggesting endocarditis may be the major cause of severe MR.    - TCTS to see (Dr. Cyndia Bent) to consider MVR.  4. 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  ?due to sedentary lifestyle + ?genetic predisposition.   - Was on Xarelto PTA, now heparin gtt.  Restart Xarelto prior to discharge.  5. Smoking: I strongly encouraged him to quit.  6. PNA: WBCs 21 => 13 => 9 => 13.4, afebrile.  Culture NGTD.  RML PNA on CT chest.  - He completed course of abx initially for PNA.  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 He is able to walk on the stump.  - PT.  8. 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  There is also mobile vegetation that appears adherent to plaque in the proximal descending thoracic aorta.  CT head showed no evidence for embolic disease.  Possible source would be due to poor dentition. Blood cultures from 11/10 NGTD.  He was initially on abx for PNA.  We re-drew cultures on 11/16.  ESR is 1, CRP 1.7.  WBCs elevated but afebrile.  At this point, I am not sure that we are going to find a source by culture as he has been already partially treated.  - ID to assess.  - Currently on cefepime/vancomycin.    Need to mobilize.   CRITICAL CARE Performed by: Loralie Champagne  Total critical care time: 35 minutes  Critical care time was exclusive of separately billable procedures and treating other patients.  Critical care was necessary to treat or prevent imminent or life-threatening deterioration.  Critical care was time spent personally by me on the following activities: development of treatment plan with patient and/or surrogate as well as nursing, discussions with consultants,  evaluation of patient's response to treatment, examination of patient, obtaining history from patient or surrogate, ordering and performing treatments and interventions, ordering and review of laboratory studies, ordering and review of radiographic studies, pulse oximetry and re-evaluation of patient's condition.   Length of Stay: 7  Loralie Champagne, MD  02/03/2021, 8:12 AM  Advanced Heart Failure Team Pager [phone] (M-F; 7a - 5p)  Please contact [facility] for night-coverage after hours (5p -7a ) and weekends on [email or web address]

## 2021-02-03 NOTE — Progress Notes (Signed)
ANTICOAGULATION CONSULT NOTE  Pharmacy Consult to transition back to heparin Indication: pre-existing DVT  Allergies  Allergen Reactions   Iodide Rash    Patient Measurements: Height: 5\' 11"  (180.3 cm) Weight: 68.9 kg (151 lb 14.4 oz) (standing) IBW/kg (Calculated) : 75.3  Heparin Dosing Weight: 72.6 kg  Vital Signs: Temp: 97.8 F (36.6 C) (11/17 0300) Temp Source: Oral (11/17 0300) BP: 120/81 (11/17 0600) Pulse Rate: 79 (11/17 0600)  Labs: Recent Labs    01/31/21 0620 01/31/21 0900 02/01/21 0344 02/01/21 0840 02/01/21 1329 02/01/21 1331 02/02/21 0500 02/03/21 0405  HGB 9.4*   < > 8.9*  --   --   --  10.4* 10.9*  HCT 33.8*   < > 31.4*  --   --   --  36.9* 37.6*  PLT 151   < > 153  --   --   --  193 224  APTT 53*  --   --  85* 79*  --   --   --   HEPARINUNFRC 0.85*  --   --  0.45  --  0.42 0.56 0.47  CREATININE 1.39*   < > 1.21  --   --   --  1.22 1.27*   < > = values in this interval not displayed.     Estimated Creatinine Clearance: 73.8 mL/min (A) (by C-G formula based on SCr of 1.27 mg/dL (H)).   Medications:  Medications Prior to Admission  Medication Sig Dispense Refill Last Dose   XARELTO 20 MG TABS tablet Take 20 mg by mouth every evening.   01/25/2021 at 1900   Infusions:   sodium chloride     sodium chloride Stopped (01/31/21 2357)   amiodarone 30 mg/hr (02/03/21 0507)   ceFEPime (MAXIPIME) IV 2 g (02/03/21 0525)   heparin 1,200 Units/hr (02/03/21 0507)   magnesium sulfate bolus IVPB     milrinone 0.375 mcg/kg/min (02/03/21 0507)   norepinephrine (LEVOPHED) Adult infusion Stopped (02/02/21 1739)   vancomycin      Assessment: 40 y.o. male with medical history significant for asthma/COPD, recent DVT, tobacco abuse who presented with 3 days SOB and CP. CT showed no evidence of pulmonary embolism.. 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 Pharmacy consulted to initiate heparin infusion.   Heparin level of  0.47 is therapeutic on 1200 units/hr. No s/sx of bleeding or infusion issues. CBC stable.  Goal of Therapy:  Heparin level 0.3-0.7 units/ml Monitor platelets by anticoagulation protocol: Yes   Plan:  Continue heparin infusion at 1200 units/hr Continue to monitor daily CBC, heparin level Monitor s/sx bleeding  45, PharmD PGY1 Pharmacy Resident 02/03/2021  6:10 AM  Please check [email or web address] for unit-specific pharmacy phone numbers.

## 2021-02-03 NOTE — Consult Note (Signed)
Department of Dental Medicine              INPATIENT CONSULT   Service Date:   02/03/2021 Admit Date:   01/27/2021  Patient Name:   Mark Garrett Date of Birth:   Jul 23, 1980 Medical Record Number: [id number]  Referring Provider:                Evelene Croon, M.D.    PLAN/RECOMMENDATIONS   Assessment There are no current signs of acute odontogenic infection including abscess, edema or erythema, or suspicious lesion requiring biopsy.   Rampant decay in all 4 quadrants, retained root tips, periodontal concerns including bone loss & gingival inflammation.  Multiple teeth that are non-restorable.  Recommendations Extractions of all indicated teeth to decrease the risk of perioperative and postoperative systemic infection and complications .    Plan Discuss case with medical team and coordinate treatment as needed.  Timing of dental surgery TBD.  Treatment to be completed in the operating room; the patient will need to be off of heparin 4-6 hours prior to procedure and can resume 6-8 hours afterwards (pending medical team's recommendations).  Discussed in detail all treatment options and recommendations with the patient and they are agreeable to the plan.    Thank you for consulting with [facility] and for the opportunity to participate in this patient's treatment.  Should you have any questions or concerns, please contact the [facility] at [phone].   02/03/2021 Consult Note:   HISTORY OF PRESENT ILLNESS: Mark Garrett is a pleasant 40 y.o. male with h/o coronary artery disease (s/p CABG in 2006), congestive heart failure, DVT, COPD, asthma, tobacco use (cigarettes) and anemia who is currently admitted for cardiogenic shock and additional diagnoses including mitral valve endocarditis and severe mitral regurgitation.  He is being considered for possible MVR.  [facility]ospital dentistry was consulted to complete a medically-necessary dental evaluation as  part of the patient's pre-cardiac surgical work-up.   DENTAL HISTORY: The patient reports it has been several years since he has seen a dentist.  He has not really thought about his oral health or his long-term goals including replacement of missing teeth.  He says that he would like to "keep my teeth for as long as possible."  He currently denies any dental/orofacial pain or sensitivity. Patient is able to manage oral secretions.  Patient denies dysphagia, odynophagia, dysphonia, SOB and neck pain.  Patient denies fever, rigors and malaise.   CHIEF COMPLAINT:  Preoperative dental consult.   Patient Active Problem List   Diagnosis Date Noted  . Cardiogenic shock (HCC) 01/30/2021  . Severe mitral regurgitation 01/29/2021  . Serum total bilirubin elevated 01/29/2021  . Paroxysmal VT 01/29/2021  . Acute systolic CHF (congestive heart failure) (HCC) 01/28/2021  . CAP (community acquired pneumonia) 01/27/2021  . Pleural effusion on right 01/27/2021  . Leukocytosis 01/27/2021  . Microcytic anemia 01/27/2021  . Hypoalbuminemia 01/27/2021  . Elevated brain natriuretic peptide (BNP) level 01/27/2021  . NSTEMI (non-ST elevated myocardial infarction) (HCC) 01/27/2021  . DVT (deep venous thrombosis) (HCC) 01/27/2021  . Diarrhea 01/27/2021  . Cigarette smoker 01/27/2021  . COPD (chronic obstructive pulmonary disease) (HCC) 01/27/2021  . Asthma 01/27/2021   Past Medical History:  Diagnosis Date  . COPD (chronic obstructive pulmonary disease) (HCC)   . DVT (deep venous thrombosis) (HCC)    Past Surgical History:  Procedure Laterality Date  . CORONARY ARTERY BYPASS GRAFT    .  HERNIA REPAIR    . RIGHT/LEFT HEART CATH AND CORONARY/GRAFT ANGIOGRAPHY N/A 01/31/2021   Procedure: RIGHT/LEFT HEART CATH AND CORONARY/GRAFT ANGIOGRAPHY;  Surgeon: Laurey Morale, MD;  Location: [facility] INVASIVE CV LAB;  Service: Cardiovascular;  Laterality: N/A;   Allergies  Allergen Reactions  . Iodide Rash    Current Facility-Administered Medications  Medication Dose Route Frequency Provider Last Rate Last Admin  . 0.9 %  sodium chloride infusion  250 mL Intravenous PRN Robbie Lis M, PA-C      . 0.9 %  sodium chloride infusion   Intra-arterial PRN Allayne Butcher, PA-C   Stopped at 01/31/21 2357  . acetaminophen (TYLENOL) tablet 650 mg  650 mg Oral Q4H PRN Robbie Lis M, PA-C      . amiodarone (NEXTERONE PREMIX) 360-4.14 MG/200ML-% (1.8 mg/mL) IV infusion  30 mg/hr Intravenous Continuous Allayne Butcher, PA-C 16.67 mL/hr at 02/03/21 0507 30 mg/hr at 02/03/21 0507  . aspirin chewable tablet 81 mg  81 mg Oral Daily Robbie Lis M, PA-C   81 mg at 02/03/21 0818  . ceFEPIme (MAXIPIME) 2 g in sodium chloride 0.9 % 100 mL IVPB  2 g Intravenous Q8H Trudee Grip, RPH 200 mL/hr at 02/03/21 0525 2 g at 02/03/21 0525  . Chlorhexidine Gluconate Cloth 2 % PADS 6 each  6 each Topical Daily Allayne Butcher, PA-C   6 each at 02/03/21 0820  . dextromethorphan-guaiFENesin (MUCINEX DM) 30-600 MG per 12 hr tablet 1 tablet  1 tablet Oral BID Allayne Butcher, PA-C   1 tablet at 02/03/21 0817  . digoxin (LANOXIN) tablet 0.125 mg  0.125 mg Oral Daily Laurey Morale, MD   0.125 mg at 02/03/21 1610  . feeding supplement (ENSURE ENLIVE / ENSURE PLUS) liquid 237 mL  237 mL Oral BID BM Simmons, Brittainy M, PA-C   237 mL at 02/01/21 1300  . heparin ADULT infusion 100 units/mL (25000 units/281mL)  1,200 Units/hr Intravenous Continuous Allayne Butcher, PA-C 12 mL/hr at 02/03/21 0507 1,200 Units/hr at 02/03/21 0507  . Living Better with Heart Failure Book   Does not apply Once Robbie Lis M, PA-C      . magnesium sulfate IVPB 2 g 50 mL  2 g Intravenous Once Robbie Lis M, PA-C      . milrinone (PRIMACOR) 20 MG/100 ML (0.2 mg/mL) infusion  0.375 mcg/kg/min Intravenous Continuous Robbie Lis M, PA-C 9.23 mL/hr at 02/03/21 0507 0.375 mcg/kg/min at 02/03/21 0507  .  multivitamin with minerals tablet 1 tablet  1 tablet Oral Daily Allayne Butcher, PA-C   1 tablet at 02/03/21 9604  . nicotine (NICODERM CQ - dosed in mg/24 hours) patch 21 mg  21 mg Transdermal QHS Robbie Lis M, PA-C   21 mg at 02/02/21 2127  . norepinephrine (LEVOPHED)  in premix infusion  3 mcg/min Intravenous Continuous Allayne Butcher, PA-C   Stopped at 02/02/21 1739  . ondansetron (ZOFRAN) injection 4 mg  4 mg Intravenous Q6H PRN Robbie Lis M, PA-C      . pantoprazole (PROTONIX) EC tablet 40 mg  40 mg Oral Daily Robbie Lis M, PA-C   40 mg at 02/03/21 0818  . rosuvastatin (CRESTOR) tablet 40 mg  40 mg Oral Daily Robbie Lis M, PA-C   40 mg at 02/03/21 0817  . sodium chloride flush (NS) 0.9 % injection 10-40 mL  10-40 mL Intracatheter Q12H Robbie Lis M, PA-C   10 mL at 02/02/21 2127  .  sodium chloride flush (NS) 0.9 % injection 10-40 mL  10-40 mL Intracatheter PRN Robbie Lis M, PA-C      . sodium chloride flush (NS) 0.9 % injection 3 mL  3 mL Intravenous Q12H Simmons, Brittainy M, PA-C   3 mL at 01/30/21 1009  . sodium chloride flush (NS) 0.9 % injection 3 mL  3 mL Intravenous Q12H Robbie Lis M, PA-C   3 mL at 02/02/21 2127  . sodium chloride flush (NS) 0.9 % injection 3 mL  3 mL Intravenous PRN Robbie Lis M, PA-C      . spironolactone (ALDACTONE) tablet 12.5 mg  12.5 mg Oral Daily Laurey Morale, MD   12.5 mg at 02/03/21 0818  . Thrombi-Pad 3"X3" pad 1 each  1 each Topical Once Allayne Butcher, PA-C      . torsemide (DEMADEX) tablet 20 mg  20 mg Oral Daily Laurey Morale, MD   20 mg at 02/03/21 0819  . vancomycin (VANCOREADY) IVPB 1000 mg/200 mL  1,000 mg Intravenous Q24H Trudee Grip, [facility]        LABS: Lab Results  Component Value Date   WBC 13.4 (H) 02/03/2021   HGB 10.9 (L) 02/03/2021   HCT 37.6 (L) 02/03/2021   MCV 68.1 (L) 02/03/2021   PLT 224 02/03/2021      Component Value Date/Time   NA  132 (L) 02/03/2021 0405   K 3.7 02/03/2021 0405   CL 91 (L) 02/03/2021 0405   CO2 32 02/03/2021 0405   GLUCOSE 88 02/03/2021 0405   BUN 16 02/03/2021 0405   CREATININE 1.27 (H) 02/03/2021 0405   CALCIUM 8.9 02/03/2021 0405   GFRNONAA >60 02/03/2021 0405   Lab Results  Component Value Date   INR 2.5 (H) 01/27/2021   No results found for: PTT  Social History   Socioeconomic History  . Marital status: Unknown    Spouse name: Not on file  . Number of children: Not on file  . Years of education: Not on file  . Highest education level: Not on file  Occupational History  . Not on file  Tobacco Use  . Smoking status: Every Day    Types: Cigarettes  . Smokeless tobacco: Never  Vaping Use  . Vaping Use: Never used  Substance and Sexual Activity  . Alcohol use: Not on file  . Drug use: Not on file  . Sexual activity: Yes  Other Topics Concern  . Not on file  Social History Narrative  . Not on file   Social Determinants of Health   Financial Resource Strain: Not on file  Food Insecurity: Not on file  Transportation Needs: Not on file  Physical Activity: Not on file  Stress: Not on file  Social Connections: Not on file  Intimate Partner Violence: Not on file   Family History  Problem Relation Age of Onset  . CAD Mother   . CAD Father      REVIEW OF SYSTEMS:  Reviewed with the patient as per HPI. Psych: Patient denies having dental phobia.   VITAL SIGNS: BP (!) 187/105 (BP Location: Left Arm)   Pulse 81   Temp 98.4 F (36.9 C) (Oral)   Resp 16   Ht 5\' 11"  (1.803 m)   Wt 68.9 kg Comment: standing  SpO2 97%   BMI 21.19 kg/m    PHYSICAL EXAM: General:  Well-developed, comfortable and in no apparent distress. Neurological:  Alert and oriented to person, place and  time.  Extraoral:  Facial symmetry present without any edema or erythema.  No swelling or lymphadenopathy.  TMJ asymptomatic without clicks or crepitations. Intraoral:  Soft tissues appear  well-perfused and mucous membranes moist.  FOM and vestibules soft and not raised. Oral cavity without mass or lesion. No signs of infection, parulis, sinus tract, edema or erythema evident upon exam.   DENTAL EXAM:  All clinical findings charted.      Overall impression:  Poor remaining dentition.    Oral hygiene:  Poor Periodontal:  Inflamed and erythematous gingival tissue.  Generalized plaque accumulation. Caries:  Rampant decay in all 4 quadrants; multiple class V lesions Retained root tips:  #2, #15, #18 Removable/fixed prosthodontics:  Patient denies wearing partial dentures.   Occlusion:  Non-functional teeth: #13 Supra-erupted teeth: #13 Other findings:   (+) Attrition/wear:  #6-#11 incisal, #23-#26 incisal   RADIOGRAPHIC EXAM:   02/02/21 Orthopantogram reviewed and interpreted.   Condyles seated bilaterally in fossas.  No evidence of abnormal pathology.  All visualized osseous structures appear WNL.  Evidence of bone loss surrounding mandibular anterior teeth likely indicative of periodontitis.  Missing teeth #'s 1, 16, 17, 20, 31 & 32.  Retained root tips- #2, #15 & #18 w/ periapical radiolucencies.  Caries- #3, #21 & #31 have deep decay approximating the pulp.  Existing restorations notable on #3, #19 & #30.   ASSESSMENT:  1.  Mitral valve endocarditis 2.  Severe mitral regurgitation 3.  Long-term use of anticoagulation (Xarelto) 4.  Preoperative dental exam 5.  Caries 6.  Retained root tips 7.  Chronic apical periodontitis 8.  Missing teeth 9.  Chronic periodontitis 10.  Accretions on teeth 11.  Attrition/wear 12.  Postoperative bleeding risk   PLAN AND RECOMMENDATIONS: I discussed the risks, benefits, and complications of various scenarios with the patient in relationship to their medical and dental conditions, which included systemic infection such as endocarditis, bacteremia or other serious issues that could potentially occur either before, during or after  their anticipated heart surgery if dental/oral concerns are not addressed.  I explained that if any chronic or acute dental/oral infection(s) are addressed and subsequently not maintained following medical optimization and recovery, their risk of the previously mentioned complications are just as high and could potentially occur postoperatively.  I explained all significant findings of the dental consultation with the patient including severe caries on a majority of his remaining dentition including retained root tips and cavities on the front surfaces of his teeth approaching the gumline, inflamed, irritated/red gum tissue and periodontal concerns, and the recommended care including extractions of all teeth that are non-restorable due to bone loss, other periodontal issues, caries or chronic infection in order to optimize them for heart surgery from a dental standpoint.  The patient verbalized understanding of all findings, discussion, and recommendations. We then discussed various treatment options to include no treatment, multiple extractions with alveoloplasty, pre-prosthetic surgery as indicated, periodontal therapy, dental restorations, root canal therapy, crown and bridge therapy, implant therapy, and replacement of missing teeth as indicated.  The patient verbalized understanding of all options, and currently wishes to proceed with extractions of teeth that are necessary to decrease his risk of systemic infection and complications. Plan to discuss all findings and recommendations with medical team and coordinate future care as needed.   The patient will need to establish care at a dental office of his choice for routine dental care including replacement of missing teeth as needed, cleanings and exams following discharge, recovery and medical optimization.  All questions and concerns were invited and addressed.  The patient tolerated today's visit well.       Arbor Cohen B. Chales Salmon, D.M.D.

## 2021-02-03 NOTE — Progress Notes (Signed)
CARDIAC REHAB PHASE I   PRE:  Rate/Rhythm: 81 SR  BP:  Sitting: 118/76      SaO2: 96 RA  MODE:  Ambulation: 270 ft   POST:  Rate/Rhythm: 99 SR  BP:  Sitting: 132/87    SaO2: 100 RA   Pt ambulated 262ft in hallway standby assist with slow, steady gait. Pt denies CP, SOB, or dizziness. Does c/o leg weakness and achiness. Pt returned to bed, set up lunch. Encouraged continued ambulation. Will continue to follow.  [id number] Reynold Bowen, RN BSN 02/03/2021 11:37 AM

## 2021-02-03 NOTE — Plan of Care (Signed)
  Problem: Education: Goal: Knowledge of General Education information will improve Description: Including pain rating scale, medication(s)/side effects and non-pharmacologic comfort measures Outcome: Progressing   Problem: Health Behavior/Discharge Planning: Goal: Ability to manage health-related needs will improve Outcome: Progressing   Problem: Clinical Measurements: Goal: Ability to maintain clinical measurements within normal limits will improve Outcome: Progressing Goal: Will remain free from infection Outcome: Progressing Goal: Diagnostic test results will improve Outcome: Progressing Goal: Respiratory complications will improve Outcome: Progressing Goal: Cardiovascular complication will be avoided Outcome: Progressing   Problem: Activity: Goal: Risk for activity intolerance will decrease Outcome: Progressing   Problem: Nutrition: Goal: Adequate nutrition will be maintained Outcome: Progressing   Problem: Coping: Goal: Level of anxiety will decrease Outcome: Progressing   Problem: Elimination: Goal: Will not experience complications related to bowel motility Outcome: Progressing Goal: Will not experience complications related to urinary retention Outcome: Progressing   Problem: Pain Managment: Goal: General experience of comfort will improve Outcome: Progressing   Problem: Safety: Goal: Ability to remain free from injury will improve Outcome: Progressing   Problem: Skin Integrity: Goal: Risk for impaired skin integrity will decrease Outcome: Progressing   Problem: Education: Goal: Understanding of CV disease, CV risk reduction, and recovery process will improve Outcome: Progressing Goal: Individualized Educational Video(s) Outcome: Progressing   Problem: Activity: Goal: Ability to return to baseline activity level will improve Outcome: Progressing   Problem: Cardiovascular: Goal: Ability to achieve and maintain adequate cardiovascular perfusion  will improve Outcome: Progressing Goal: Vascular access site(s) Level 0-1 will be maintained Outcome: Progressing   Problem: Health Behavior/Discharge Planning: Goal: Ability to safely manage health-related needs after discharge will improve Outcome: Progressing   Problem: Education: Goal: Ability to demonstrate management of disease process will improve Outcome: Progressing Goal: Ability to verbalize understanding of medication therapies will improve Outcome: Progressing Goal: Individualized Educational Video(s) Outcome: Progressing   Problem: Activity: Goal: Capacity to carry out activities will improve Outcome: Progressing   Problem: Cardiac: Goal: Ability to achieve and maintain adequate cardiopulmonary perfusion will improve Outcome: Progressing   

## 2021-02-03 NOTE — Consult Note (Signed)
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 Referring Physician: Dr. Loralie Champagne  Mark Garrett is an 40 y.o. male.  HPI:   The patient is a 40 year old gentleman with a history of coronary disease status post CABG in [place] in 2006, ischemic cardiomyopathy with severe mitral regurgitation felt to be due to endocarditis, active smoking, and recent DVT who reports feeling fine until shortly before he presented to [facility] in October 2022 with bilateral DVTs and started on Xarelto.  An echo at that time reportedly showed an ejection fraction of 55 to 60% with anterior hypokinesis and severe mitral regurgitation.  He was then admitted to [facility] on 01/27/2021 with chest pain, leg swelling, cough, and hypoxemia.  He was COVID and influenza negative.  Lactate level initially was 3.8.  CTA of the chest showed no evidence of pulmonary embolism but showed right middle lobe pneumonia and a moderate sized right pleural effusion.  BNP was 1733.  High-sensitivity troponin was mildly elevated with a flat trend.  Ultrasound of his legs showed bilateral DVTs that were age-indeterminate.  He was treated with diuretic and ceftriaxone/azithromycin.  He developed nonsustained VT and was started on amiodarone.  A 2D echocardiogram done here showed an ejection fraction of 35 to 40% with severe hypokinesis of the inferior and inferolateral walls with moderate LV dilatation.  There is mild RV dilation with moderately decreased RV systolic function.  The posterior mitral leaflet was restricted with calcification of the mitral valve and severe mitral regurgitation.  There is felt to be at least mild mitral stenosis with a mean gradient of 8 mmHg.  PA systolic pressure is estimated at 60.  He had a central line placed and a Co-ox was 39%. He was started on  milrinone 0.25 which has subsequently been increased to 0.375.  He underwent cardiac catheterization on 01/31/2021 which showed a 95% mid LAD stenosis with a patent left internal mammary graft filling the distal vessel.  The large ramus branch has about 80% stenosis.  The right coronary artery was a large dominant vessel with mild nonobstructive disease.  A vein graft off the aorta was completely occluded and it was felt that this probably went to the ramus branch although Dr. Aundra Dubin reviewed the operative note from [place] and this vein graft was to a posterolateral branch.  PA pressure was elevated at 76/25 with a mean of 44.  Wedge pressure was 22 with a V wave of 37.  Aortic pressure 100/60.  PA saturation was 58% with arterial saturation 97%.  Cardiac index was 2.67 with a pulmonary vascular resistance at 4.1 Wood units.  PAPI was 2.8.  A TEE was performed yesterday which showed mobile vegetation on both the anterior and posterior mitral valve leaflets which did not completely coapt.  There was some destruction of the leaflets.  There was felt to be severe mitral regurgitation.  Left ventricular ejection fraction was 35 to 40% with mild dilation of the LV cavity.  RV systolic function was mildly reduced.  The aortic valve is trileaflet with trivial regurgitation and no stenosis.  There are no vegetation seen on the aortic valve.  Tricuspid valve has mild regurgitation with no vegetation seen.  Blood cultures so far have been negative.  He was seen by dental medicine and extraction of multiple teeth was indicated to decrease the risk of perioperative and postoperative infection.  The patient lives alone and takes care of himself.  He is unemployed.  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  He is not on disability.  He smokes about 1/2 pack of cigarettes per day.  He admits to smoking some marijuana but has never used intravenous drugs.   Past  Medical History:  Diagnosis Date   COPD (chronic obstructive pulmonary disease) ([place])    DVT (deep venous thrombosis) ([place])     Past Surgical History:  Procedure Laterality Date   CORONARY ARTERY BYPASS GRAFT     HERNIA REPAIR     RIGHT/LEFT HEART CATH AND CORONARY/GRAFT ANGIOGRAPHY N/A 01/31/2021   Procedure: RIGHT/LEFT HEART CATH AND CORONARY/GRAFT ANGIOGRAPHY;  Surgeon: Larey Dresser, MD;  Location: [facility];  Service: Cardiovascular;  Laterality: N/A;    Family History  Problem Relation Age of Onset   CAD Mother    CAD Father     Social History:  reports that he has been smoking cigarettes. He has never used smokeless tobacco. No history on file for alcohol use and drug use.  Allergies:  Allergies  Allergen Reactions   Iodide Rash    Medications: I have reviewed the patient's current medications. Prior to Admission:  Medications Prior to Admission  Medication Sig Dispense Refill Last Dose   XARELTO 20 MG TABS tablet Take 20 mg by mouth every evening.   01/25/2021 at 1900   Scheduled:  aspirin  81 mg Oral Daily   Chlorhexidine Gluconate Cloth  6 each Topical Daily   dextromethorphan-guaiFENesin  1 tablet Oral BID   digoxin  0.125 mg Oral Daily   feeding supplement  237 mL Oral TID BM   Living Better with Heart Failure Book   Does not apply Once   multivitamin with minerals  1 tablet Oral Daily   nicotine  21 mg Transdermal QHS   pantoprazole  40 mg Oral Daily   rosuvastatin  40 mg Oral Daily   sodium chloride flush  10-40 mL Intracatheter Q12H   sodium chloride flush  3 mL Intravenous Q12H   sodium chloride flush  3 mL Intravenous Q12H   spironolactone  12.5 mg Oral Daily   Thrombi-Pad  1 each Topical Once   torsemide  20 mg Oral Daily   Continuous:  sodium chloride     sodium chloride Stopped (01/31/21 2357)   amiodarone 30 mg/hr (02/03/21 1935)   ceFEPime (MAXIPIME) IV Stopped (02/03/21 1410)   heparin 1,200 Units/hr (02/03/21 1900)    magnesium sulfate bolus IVPB     milrinone 0.375 mcg/kg/min (02/03/21 1900)   norepinephrine (LEVOPHED) Adult infusion Stopped (02/02/21 1739)   vancomycin Stopped (02/03/21 1717)   VQQ:VZDGLO chloride, Place/Maintain arterial line **AND** sodium chloride, acetaminophen, ondansetron (ZOFRAN) IV, sodium chloride flush, sodium chloride flush Anti-infectives (From admission, onward)    Start     Dose/Rate Route Frequency Ordered Stop   02/03/21 1600  vancomycin (VANCOREADY) IVPB 1750 mg/350 mL  Status:  Discontinued        1,750 mg 175 mL/hr over 120 Minutes Intravenous Every 24 hours 02/02/21 1541 02/02/21 1659   02/03/21 1600  vancomycin (VANCOREADY) IVPB 1000 mg/200 mL        1,000 mg 200 mL/hr over 60 Minutes Intravenous Every 24 hours 02/02/21 1659  02/02/21 1630  ceFEPIme (MAXIPIME) 2 g in sodium chloride 0.9 % 100 mL IVPB        2 g 200 mL/hr over 30 Minutes Intravenous Every 8 hours 02/02/21 1535     02/02/21 1630  vancomycin (VANCOREADY) IVPB 1500 mg/300 mL        1,500 mg 150 mL/hr over 120 Minutes Intravenous  Once 02/02/21 1535 02/02/21 1932   01/28/21 1700  cefTRIAXone (ROCEPHIN) 1 g in sodium chloride 0.9 % 100 mL IVPB  Status:  Discontinued        1 g 200 mL/hr over 30 Minutes Intravenous Every 24 hours 01/27/21 2119 01/28/21 1213   01/28/21 1300  amoxicillin-clavulanate (AUGMENTIN) 875-125 MG per tablet 1 tablet        1 tablet Oral Every 12 hours 01/28/21 1213 02/01/21 2116   01/27/21 1730  cefTRIAXone (ROCEPHIN) 1 g in sodium chloride 0.9 % 100 mL IVPB        1 g 200 mL/hr over 30 Minutes Intravenous  Once 01/27/21 1725 01/27/21 1831   01/27/21 1730  azithromycin (ZITHROMAX) 500 mg in sodium chloride 0.9 % 250 mL IVPB  Status:  Discontinued        500 mg 250 mL/hr over 60 Minutes Intravenous Every 24 hours 01/27/21 1725 01/28/21 1213       Results for orders placed or performed during the hospital encounter of 01/27/21 (from the past 48 hour(s))  .Cooxemetry  Panel (carboxy, met, total hgb, O2 sat)     Status: Abnormal   Collection Time: 02/02/21  4:50 AM  Result Value Ref Range   Total hemoglobin 10.7 (L) 12.0 - 16.0 g/dL   O2 Saturation 42.9 %   Carboxyhemoglobin 1.8 (H) 0.5 - 1.5 %   Methemoglobin 0.8 0.0 - 1.5 %    Comment: Performed at [facility] [address], [place], [place] [address]  CBC     Status: Abnormal   Collection Time: 02/02/21  5:00 AM  Result Value Ref Range   WBC 12.1 (H) 4.0 - 10.5 K/uL   RBC 5.44 4.22 - 5.81 MIL/uL   Hemoglobin 10.4 (L) 13.0 - 17.0 g/dL   HCT 36.9 (L) 39.0 - 52.0 %   MCV 67.8 (L) 80.0 - 100.0 fL   MCH 19.1 (L) 26.0 - 34.0 pg   MCHC 28.2 (L) 30.0 - 36.0 g/dL   RDW 23.9 (H) 11.5 - 15.5 %   Platelets 193 150 - 400 K/uL    Comment: REPEATED TO VERIFY   nRBC 1.5 (H) 0.0 - 0.2 %    Comment: Performed at [facility], [place] [address], [place], [place] [address]  Basic metabolic panel     Status: Abnormal   Collection Time: 02/02/21  5:00 AM  Result Value Ref Range   Sodium 133 (L) 135 - 145 mmol/L   Potassium 3.9 3.5 - 5.1 mmol/L   Chloride 88 (L) 98 - 111 mmol/L   CO2 36 (H) 22 - 32 mmol/L   Glucose, Bld 88 70 - 99 mg/dL    Comment: Glucose reference range applies only to samples taken after fasting for at least 8 hours.   BUN 13 6 - 20 mg/dL   Creatinine, Ser 1.22 0.61 - 1.24 mg/dL   Calcium 9.2 8.9 - 10.3 mg/dL   GFR, Estimated >60 >60 mL/min    Comment: (NOTE) Calculated using the CKD-EPI Creatinine Equation (2021)    Anion gap 9 5 - 15  Comment: Performed at [facility], [place] [address] [place], [place] [address]  Heparin level (unfractionated)     Status: None   Collection Time: 02/02/21  5:00 AM  Result Value Ref Range   Heparin Unfractionated 0.56 0.30 - 0.70 IU/mL    Comment: (NOTE) The clinical reportable range upper limit is being lowered to >1.10 to align with the FDA approved guidance for the current laboratory assay.  If heparin results are below  expected values, and patient dosage has  been confirmed, suggest follow up testing of antithrombin III levels. Performed at [facility], [place] [address] [place], [place] [address]   Magnesium     Status: None   Collection Time: 02/02/21  5:00 AM  Result Value Ref Range   Magnesium 1.8 1.7 - 2.4 mg/dL    Comment: Performed at [facility] [address], [place], [place] [address]  .Cooxemetry Panel (carboxy, met, total hgb, O2 sat)     Status: Abnormal   Collection Time: 02/02/21  6:08 AM  Result Value Ref Range   Total hemoglobin 10.8 (L) 12.0 - 16.0 g/dL   O2 Saturation 35.8 %   Carboxyhemoglobin 1.5 0.5 - 1.5 %   Methemoglobin 0.8 0.0 - 1.5 %    Comment: Performed at [facility], [place] [address] [place], [place] [address]  .Cooxemetry Panel (carboxy, met, total hgb, O2 sat)     Status: Abnormal   Collection Time: 02/02/21  7:55 AM  Result Value Ref Range   Total hemoglobin 10.9 (L) 12.0 - 16.0 g/dL   O2 Saturation 58.6 %   Carboxyhemoglobin 1.7 (H) 0.5 - 1.5 %   Methemoglobin 0.9 0.0 - 1.5 %    Comment: Performed at [facility] [address], [place], [place] [address]  Sedimentation rate     Status: None   Collection Time: 02/02/21  2:08 PM  Result Value Ref Range   Sed Rate 1 0 - 16 mm/hr    Comment: Performed at [facility] [address], [place], [place] [address]  C-reactive protein     Status: Abnormal   Collection Time: 02/02/21  2:08 PM  Result Value Ref Range   CRP 1.7 (H) <1.0 mg/dL    Comment: Performed at [facility], [place] [address] [place], [place] [address]  Culture, blood (routine x 2)     Status: None (Preliminary result)   Collection Time: 02/02/21  2:34 PM   Specimen: BLOOD  Result Value Ref Range   Specimen Description BLOOD RIGHT ANTECUBITAL    Special Requests      BOTTLES DRAWN AEROBIC AND ANAEROBIC Blood Culture adequate volume   Culture      NO GROWTH < 24 HOURS Performed at [facility],  [place] [address] [place], [place] [address]    Report Status PENDING   Culture, blood (routine x 2)     Status: None (Preliminary result)   Collection Time: 02/02/21  2:34 PM   Specimen: BLOOD RIGHT HAND  Result Value Ref Range   Specimen Description BLOOD RIGHT HAND    Special Requests      BOTTLES DRAWN AEROBIC AND ANAEROBIC Blood Culture adequate volume   Culture      NO GROWTH < 24 HOURS Performed at [facility], [place] [address], [place], [place] [address]    Report Status PENDING   CBC     Status: Abnormal  Collection Time: 02/03/21  4:05 AM  Result Value Ref Range   WBC 13.4 (H) 4.0 - 10.5 K/uL   RBC 5.52 4.22 - 5.81 MIL/uL   Hemoglobin 10.9 (L) 13.0 - 17.0 g/dL   HCT 37.6 (L) 39.0 - 52.0 %   MCV 68.1 (L) 80.0 - 100.0 fL   MCH 19.7 (L) 26.0 - 34.0 pg   MCHC 29.0 (L) 30.0 - 36.0 g/dL   RDW 24.2 (H) 11.5 - 15.5 %   Platelets 224 150 - 400 K/uL    Comment: REPEATED TO VERIFY   nRBC 1.0 (H) 0.0 - 0.2 %    Comment: Performed at [facility], [place] [address] [place], [place] [address]  Basic metabolic panel     Status: Abnormal   Collection Time: 02/03/21  4:05 AM  Result Value Ref Range   Sodium 132 (L) 135 - 145 mmol/L   Potassium 3.7 3.5 - 5.1 mmol/L   Chloride 91 (L) 98 - 111 mmol/L   CO2 32 22 - 32 mmol/L   Glucose, Bld 88 70 - 99 mg/dL    Comment: Glucose reference range applies only to samples taken after fasting for at least 8 hours.   BUN 16 6 - 20 mg/dL   Creatinine, Ser 1.27 (H) 0.61 - 1.24 mg/dL   Calcium 8.9 8.9 - 10.3 mg/dL   GFR, Estimated >60 >60 mL/min    Comment: (NOTE) Calculated using the CKD-EPI Creatinine Equation (2021)    Anion gap 9 5 - 15    Comment: Performed at [facility] [address], [place], [place] [address]  .Cooxemetry Panel (carboxy, met, total hgb, O2 sat)     Status: Abnormal   Collection Time: 02/03/21  4:05 AM  Result Value Ref Range   Total hemoglobin 10.9 (L) 12.0 - 16.0 g/dL   O2 Saturation 62.4 %    Carboxyhemoglobin 2.1 (H) 0.5 - 1.5 %   Methemoglobin 0.8 0.0 - 1.5 %    Comment: Performed at [facility] [address] [place], [place] [address]  Heparin level (unfractionated)     Status: None   Collection Time: 02/03/21  4:05 AM  Result Value Ref Range   Heparin Unfractionated 0.47 0.30 - 0.70 IU/mL    Comment: (NOTE) The clinical reportable range upper limit is being lowered to >1.10 to align with the FDA approved guidance for the current laboratory assay.  If heparin results are below expected values, and patient dosage has  been confirmed, suggest follow up testing of antithrombin III levels. Performed at [facility], [place] [address] [place], [place] [address]   Magnesium     Status: None   Collection Time: 02/03/21  4:05 AM  Result Value Ref Range   Magnesium 2.1 1.7 - 2.4 mg/dL    Comment: Performed at [facility] [address] [place], [place] [address]  Phosphorus     Status: None   Collection Time: 02/03/21  4:05 AM  Result Value Ref Range   Phosphorus 3.7 2.5 - 4.6 mg/dL    Comment: Performed at [facility] [address], [place], [place] [address]  HIV Antibody (routine testing w rflx)     Status: None   Collection Time: 02/03/21  1:25 PM  Result Value Ref Range   HIV Screen 4th Generation wRfx Non Reactive Non Reactive    Comment: Performed at [facility], [place] [address], [place], [place]  [address]  Glucose, capillary     Status: Abnormal   Collection Time: 02/03/21  8:12 PM  Result Value Ref Range   Glucose-Capillary 122 (H) 70 - 99 mg/dL    Comment: Glucose reference range applies only to samples taken after fasting for at least 8 hours.    DG Orthopantogram  Result Date: 02/02/2021 CLINICAL DATA:  Possible surgical patient with endocarditis and broken teeth EXAM: ORTHOPANTOGRAM/PANORAMIC COMPARISON:  None. FINDINGS: Multiple dental amalgams. No periapical lucency. Large carie of tooth 2. Fragmented or partially  extracted most posterior left mandibular molar. IMPRESSION: 1. Multiple dental amalgams. No periapical lucency. 2. Large carie of most posterior remaining right maxillary molar. 3. Fragmented or partially extracted most posterior left mandibular molar. Correlate with direct visualization. Electronically Signed   By: Ulyses Jarred M.D.   On: 02/02/2021 20:26   CT HEAD WO CONTRAST (5MM)  Result Date: 02/02/2021 CLINICAL DATA:  TIA EXAM: CT HEAD WITHOUT CONTRAST TECHNIQUE: Contiguous axial images were obtained from the base of the skull through the vertex without intravenous contrast. COMPARISON:  December 06, 2020 FINDINGS: Brain: There is no acute intracranial hemorrhage, mass effect, or edema. No new loss of gray-white differentiation. Chronic right frontoparietal infarcts. Small chronic cerebellar infarcts. Additional patchy low-density in the supratentorial white matter is nonspecific but probably reflects stable chronic microvascular ischemic changes. There is no extra-axial fluid collection. Ventricles and sulci are stable in size and configuration. Vascular: No hyperdense vessel or unexpected calcification. Skull: Calvarium is unremarkable. Sinuses/Orbits: No acute finding. Other: None. IMPRESSION: No acute intracranial hemorrhage or evidence of acute infarction. Stable chronic/nonemergent findings detailed above. Electronically Signed   By: Macy Mis M.D.   On: 02/02/2021 15:57   ECHO TEE  Result Date: 02/02/2021    TRANSESOPHOGEAL ECHO REPORT   Patient Name:   Mark Garrett Date of Exam: 02/02/2021 Medical Rec #:  [id number]       Height:       71.0 in Accession #:    [id number]      Weight:       156.3 lb Date of Birth:  April 01, 1980        BSA:          1.899 m Patient Age:    71 years        BP:           115/78 mmHg Patient Gender: M               HR:           74 bpm. Exam Location:  Inpatient Procedure: Cardiac Doppler, Color Doppler, 3D Echo and Transesophageal Echo Indications:      Bacteremia  History:         Patient has prior history of Echocardiogram examinations, most                  recent 01/28/2021.  Sonographer:     Merrie Roof RDCS Referring Phys:  [place] Diagnosing Phys: Mark Garrett PROCEDURE: After discussion of the risks and benefits of a TEE, an informed consent was obtained from the patient. The transesophogeal probe was passed without difficulty through the esophogus of the patient. Local oropharyngeal anesthetic was provided with Cetacaine. Sedation performed by different physician. The patient was monitored while under deep sedation. The patient's vital signs; including heart rate, blood pressure, and oxygen saturation; remained stable throughout the procedure. The patient developed no complications during the procedure. IMPRESSIONS  1. Left ventricular  ejection fraction, by estimation, is 35 to 40%. The left ventricle has moderately decreased function. The left ventricle demonstrates regional wall motion abnormalities with inferior and inferolateral hypokinesis. The left ventricular internal cavity size was mildly dilated.  2. Right ventricular systolic function is mildly reduced. The right ventricular size is normal.  3. Peak RV-RA gradient 33 mmHg. No TV vegetation.  4. No pulmonary valve vegetation.  5. The aortic valve is tricuspid. Aortic valve regurgitation is trivial. No aortic stenosis is present. No AoV vegetation.  6. Left atrial size was moderately dilated. No left atrial/left atrial appendage thrombus was detected.  7. Right atrial size was moderately dilated.  8. No PFO or ASD by color doppler.  9. There was mobile vegetation on both the anterior and posterior mitral leaflets. The leaflets did not completely coapt (?destruction from endocarditis) with severe mitral regurgitation (ERO 0.56 cm^2). There was flattening but not flow reversal in the  pulmonary vein systolic doppler pattern. No significant stenosis. 10. There was extensive plaque in  the proximal descending thoracic aorta with what appears to be mobile vegetation attached to it. FINDINGS  Left Ventricle: Left ventricular ejection fraction, by estimation, is 35 to 40%. The left ventricle has moderately decreased function. The left ventricle demonstrates regional wall motion abnormalities. The left ventricular internal cavity size was mildly dilated. There is no left ventricular hypertrophy. Right Ventricle: The right ventricular size is normal. No increase in right ventricular wall thickness. Right ventricular systolic function is mildly reduced. Left Atrium: Left atrial size was moderately dilated. No left atrial/left atrial appendage thrombus was detected. Right Atrium: Right atrial size was moderately dilated. Pericardium: There is no evidence of pericardial effusion. Mitral Valve: There was mobile vegetation on both the anterior and posterior mitral leaflets. The leaflets did not completely coapt (?destruction from endocarditis) with severe mitral regurgitation (ERO 0.56 cm^2). There was flattening but not flow reversal in the pulmonary vein systolic doppler pattern. No significant stenosis. The mitral valve is abnormal. Severe mitral valve regurgitation. Tricuspid Valve: Peak RV-RA gradient 33 mmHg. The tricuspid valve is normal in structure. Tricuspid valve regurgitation is mild. Aortic Valve: The aortic valve is tricuspid. Aortic valve regurgitation is trivial. No aortic stenosis is present. Pulmonic Valve: The pulmonic valve was normal in structure. Pulmonic valve regurgitation is not visualized. Aorta: There was extensive plaque in the proximal descending thoracic aorta with what appears to be mobile vegetation attached to it. The aortic root is normal in size and structure. IAS/Shunts: No PFO or ASD by color doppler.  MR Peak grad:    94.1 mmHg MR Mean grad:    54.0 mmHg MR Vmax:         485.00 cm/s MR Vmean:        333.0 cm/s MR PISA:         9.05 cm MR PISA Eff ROA: 56 mm MR PISA  Radius:  1.20 cm Mark Garrett Electronically signed by Franki Monte Signature Date/Time: 02/02/2021/6:12:57 PM    Final     Review of Systems  Constitutional:  Positive for activity change, diaphoresis, fatigue and fever.  HENT:  Positive for dental problem.        Has not seen a dentist in many years  Eyes: Negative.   Respiratory:  Positive for cough, chest tightness and shortness of breath.   Cardiovascular:  Positive for chest pain, palpitations and leg swelling.  Gastrointestinal:  Positive for abdominal distention.  Endocrine: Negative.   Genitourinary: Negative.   Musculoskeletal:  Negative for back pain and joint swelling.  Skin: Negative.   Allergic/Immunologic: Negative.   Neurological: Negative.   Hematological: Negative.   Psychiatric/Behavioral: Negative.    Blood pressure 94/70, pulse 77, temperature 98.3 F (36.8 C), temperature source Oral, resp. rate 17, height 5' 11"  (1.803 m), weight 68.9 kg, SpO2 100 %. Physical Exam Constitutional:      Appearance: He is well-developed and normal weight.  HENT:     Head: Normocephalic and atraumatic.     Mouth/Throat:     Comments: Poor dentition Eyes:     Extraocular Movements: Extraocular movements intact.     Pupils: Pupils are equal, round, and reactive to light.  Cardiovascular:     Rate and Rhythm: Normal rate and regular rhythm.     Pulses: Normal pulses.     Heart sounds: Murmur heard.     Comments: 3/6 systolic murmur left lower sternal border. Pulmonary:     Effort: Pulmonary effort is normal.     Comments: Decreased breath sounds over the right lower lobe. Abdominal:     General: Abdomen is flat. Bowel sounds are normal. There is no distension.     Palpations: Abdomen is soft.     Tenderness: There is no abdominal tenderness.  Musculoskeletal:        General: Swelling present. Normal range of motion.     Cervical back: Normal range of motion and neck supple.     Comments: Traumatic amputation left  foot  Skin:    General: Skin is warm and dry.  Neurological:     General: No focal deficit present.     Mental Status: He is alert and oriented to person, place, and time.  Psychiatric:        Mood and Affect: Mood normal.        Behavior: Behavior normal.       TRANSESOPHOGEAL ECHO REPORT         Patient Name:   Mark Garrett Date of Exam: 02/02/2021  Medical Rec #:  [id number]       Height:       71.0 in  Accession #:    [id number]      Weight:       156.3 lb  Date of Birth:  06-07-1980        BSA:          1.899 m  Patient Age:    76 years        BP:           115/78 mmHg  Patient Gender: M               HR:           74 bpm.  Exam Location:  Inpatient   Procedure: Cardiac Doppler, Color Doppler, 3D Echo and Transesophageal  Echo   Indications:     Bacteremia     History:         Patient has prior history of Echocardiogram examinations,  most                   recent 01/28/2021.     Sonographer:     Merrie Roof RDCS  Referring Phys:  [place]  Diagnosing Phys: Mark Garrett   PROCEDURE: After discussion of the risks and benefits of a TEE, an  informed consent was obtained from the patient. The transesophogeal probe  was passed without difficulty through the esophogus of the patient. Local  oropharyngeal anesthetic was provided  with Cetacaine. Sedation performed by different physician. The patient was  monitored while under deep sedation. The patient's vital signs; including  heart rate, blood pressure, and oxygen saturation; remained stable  throughout the procedure. The patient  developed no complications during the procedure.   IMPRESSIONS     1. Left ventricular ejection fraction, by estimation, is 35 to 40%. The  left ventricle has moderately decreased function. The left ventricle  demonstrates regional wall motion abnormalities with inferior and  inferolateral hypokinesis. The left  ventricular internal cavity size was mildly dilated.   2.  Right ventricular systolic function is mildly reduced. The right  ventricular size is normal.   3. Peak RV-RA gradient 33 mmHg. No TV vegetation.   4. No pulmonary valve vegetation.   5. The aortic valve is tricuspid. Aortic valve regurgitation is trivial.  No aortic stenosis is present. No AoV vegetation.   6. Left atrial size was moderately dilated. No left atrial/left atrial  appendage thrombus was detected.   7. Right atrial size was moderately dilated.   8. No PFO or ASD by color doppler.   9. There was mobile vegetation on both the anterior and posterior mitral  leaflets. The leaflets did not completely coapt (?destruction from  endocarditis) with severe mitral regurgitation (ERO 0.56 cm^2). There was  flattening but not flow reversal in the   pulmonary vein systolic doppler pattern. No significant stenosis.  10. There was extensive plaque in the proximal descending thoracic aorta  with what appears to be mobile vegetation attached to it.   FINDINGS   Left Ventricle: Left ventricular ejection fraction, by estimation, is 35  to 40%. The left ventricle has moderately decreased function. The left  ventricle demonstrates regional wall motion abnormalities. The left  ventricular internal cavity size was  mildly dilated. There is no left ventricular hypertrophy.   Right Ventricle: The right ventricular size is normal. No increase in  right ventricular wall thickness. Right ventricular systolic function is  mildly reduced.   Left Atrium: Left atrial size was moderately dilated. No left atrial/left  atrial appendage thrombus was detected.   Right Atrium: Right atrial size was moderately dilated.   Pericardium: There is no evidence of pericardial effusion.   Mitral Valve: There was mobile vegetation on both the anterior and  posterior mitral leaflets. The leaflets did not completely coapt  (?destruction from endocarditis) with severe mitral regurgitation (ERO  0.56 cm^2). There  was flattening but not flow  reversal in the pulmonary vein systolic doppler pattern. No significant  stenosis. The mitral valve is abnormal. Severe mitral valve regurgitation.   Tricuspid Valve: Peak RV-RA gradient 33 mmHg. The tricuspid valve is  normal in structure. Tricuspid valve regurgitation is mild.   Aortic Valve: The aortic valve is tricuspid. Aortic valve regurgitation is  trivial. No aortic stenosis is present.   Pulmonic Valve: The pulmonic valve was normal in structure. Pulmonic valve  regurgitation is not visualized.   Aorta: There was extensive plaque in the proximal descending thoracic  aorta with what appears to be mobile vegetation attached to it. The aortic  root is normal in size and structure.   IAS/Shunts: No PFO or ASD by color doppler.      MR Peak grad:    94.1 mmHg  MR Mean grad:    54.0 mmHg  MR Vmax:         485.00 cm/s  MR Vmean:  333.0 cm/s  MR PISA:         9.05 cm  MR PISA Eff ROA: 56 mm  MR PISA Radius:  1.20 cm   Mark Garrett  Electronically signed by Franki Monte  Signature Date/Time: 02/02/2021/6:12:57 PM         Final     Physicians Panel Physicians Referring Physician Case Authorizing Physician  Larey Dresser, MD (Primary)    Procedures RIGHT/LEFT HEART CATH AND CORONARY/GRAFT ANGIOGRAPHY  Conclusion  Mid LAD lesion is 95% stenosed.   Ramus-1 lesion is 50% stenosed.   Ramus-2 lesion is 80% stenosed.   Mid RCA lesion is 30% stenosed.   Origin lesion is 100% stenosed.  1. Elevated R>L heart filling pressure.  2. Adequate PAPI  3. Mixed pulmonary venous/pulmonary arterial hypertension.  4. Prominent V-waves in PCWP tracing  5. Stable cardiac output on milrinone 0.375  6. 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 Patent LIMA-LAD with 99% mid LAD stenosis. Patent RCA and AV LCx.  Surgeon Notes   02/02/2021 1:24 PM CV Procedure signed by Larey Dresser, MD  Procedural Details Technical Details  Cardiac Catheterization Procedure Note  Name: Mark Garrett MRN: [id number] DOB: 09/11/1980  Procedure: Right Heart Cath, Left Heart Cath, Selective Coronary Angiography  Indication: CHF, h/o CAD   Procedural Details: The right groin and left radial area was prepped, draped, and anesthetized with 1% lidocaine. Using the modified Seldinger technique a 7 French sheath was placed in the right femoral vein. A Swan-Ganz catheter was used for the right heart catheterization. Standard protocol was followed for recording of right heart pressures and sampling of oxygen saturations. Fick and thermodilution cardiac output was calculated. The left radial artery was entered using modified Seldinger technique and a 75F sheath was placed. The patient received 3 mg IA verapamil and weight-based IV heparin. Standard Judkins catheters were used for selective coronary angiography. There were no immediate procedural complications. The patient was transferred to the post catheterization recovery area for further monitoring.    Estimated blood loss <50 mL.   During this procedure medications were administered to achieve and maintain moderate conscious sedation while the patient's heart rate, blood pressure, and oxygen saturation were continuously monitored and I was present face-to-face 100% of this time.  Medications (Filter: Administrations occurring from 5053 to 1355 on 01/31/21)  important Continuous medications are totaled by the amount administered until 01/31/21 1355.  Heparin (Porcine) in NaCl 1000-0.9 UT/500ML-% SOLN (mL) Total volume: 1,000 mL  Date/Time Rate/Dose/Volume Action    01/31/21 1251 500 mL Given   1251 500 mL Given   lidocaine (PF) (XYLOCAINE) 1 % injection (mL) Total volume: 10 mL  Date/Time Rate/Dose/Volume Action    01/31/21 1252 10 mL Given   midazolam (VERSED) injection (mg) Total dose: 0.5 mg  Date/Time Rate/Dose/Volume Action    01/31/21 1307 0.5 mg Given   fentaNYL  (SUBLIMAZE) injection (mcg) Total dose: 12.5 mcg  Date/Time Rate/Dose/Volume Action    01/31/21 1307 12.5 mcg Given   heparin sodium (porcine) injection (Units) Total dose: 4,000 Units  Date/Time Rate/Dose/Volume Action    01/31/21 1313 4,000 Units Given   Radial Cocktail/Verapamil only (mL) Total volume: 10 mL  Date/Time Rate/Dose/Volume Action    01/31/21 1310 10 mL Given   iohexol (OMNIPAQUE) 350 MG/ML injection (mL) Total volume: 75 mL  Date/Time Rate/Dose/Volume Action    01/31/21 1325 75 mL Given   aspirin chewable tablet 81 mg (mg) Total dose: Cannot be calculated* Dosing weight:  82.3  *Administration dose not documented  Date/Time Rate/Dose/Volume Action    01/31/21 1214 *Not included in total MAR Hold   Chlorhexidine Gluconate Cloth 2 % PADS 6 each (each) Total dose: Cannot be calculated* Dosing weight: 82  *Administration dose not documented  Date/Time Rate/Dose/Volume Action    01/31/21 1214 *Not included in total MAR Hold   dextromethorphan-guaiFENesin ([place] DM) 30-600 MG per 12 hr tablet 1 tablet (tablet) Total dose: Cannot be calculated* Dosing weight: 72.6  *Administration dose not documented  Date/Time Rate/Dose/Volume Action    01/31/21 1214 *Not included in total MAR Hold   Living Better with Heart Failure Book Total dose: Cannot be calculated* Dosing weight: 82  *Administration dose not documented  Date/Time Rate/Dose/Volume Action    01/31/21 1214 *Not included in total MAR Hold   magnesium sulfate IVPB 2 g 50 mL (g) Total dose: Cannot be calculated* Dosing weight: 82  *Administration dose not documented  Date/Time Rate/Dose/Volume Action    01/31/21 1214 *Not included in total MAR Hold   multivitamin with minerals tablet 1 tablet (tablet) Total dose: Cannot be calculated* Dosing weight: 81.3  *Administration dose not documented  Date/Time Rate/Dose/Volume Action    01/31/21 1214 *Not included in total MAR Hold   nicotine (NICODERM CQ -  dosed in mg/24 hours) patch 21 mg (mg) Total dose: Cannot be calculated* Dosing weight: 81.3  *Administration dose not documented  Date/Time Rate/Dose/Volume Action    01/31/21 1214 *Not included in total MAR Hold   pantoprazole (PROTONIX) EC tablet 40 mg (mg) Total dose: Cannot be calculated* Dosing weight: 72.6  *Administration dose not documented  Date/Time Rate/Dose/Volume Action    01/31/21 1214 *Not included in total MAR Hold   rosuvastatin (CRESTOR) tablet 40 mg (mg) Total dose: Cannot be calculated* Dosing weight: 82  *Administration dose not documented  Date/Time Rate/Dose/Volume Action    01/31/21 1214 *Not included in total MAR Hold   sodium chloride flush (NS) 0.9 % injection 10-40 mL (mL) Total dose: Cannot be calculated* Dosing weight: 82.3  *Administration dose not documented  Date/Time Rate/Dose/Volume Action    01/31/21 1214 *Not included in total MAR Hold   sodium chloride flush (NS) 0.9 % injection 10-40 mL (mL) Total dose: Cannot be calculated* Dosing weight: 82.3  *Administration dose not documented  Date/Time Rate/Dose/Volume Action    01/31/21 1214 *Not included in total MAR Hold   sodium chloride flush (NS) 0.9 % injection 3 mL (mL) Total dose: Cannot be calculated* Dosing weight: 82  *Administration dose not documented  Date/Time Rate/Dose/Volume Action    01/31/21 1214 *Not included in total MAR Hold   amoxicillin-clavulanate (AUGMENTIN) 875-125 MG per tablet 1 tablet (tablet) Total dose: Cannot be calculated* Dosing weight: 81.3  *Administration dose not documented  Date/Time Rate/Dose/Volume Action    01/31/21 1214 *Not included in total MAR Hold   potassium chloride SA (KLOR-CON) CR tablet 40 mEq (mEq) Total dose: Cannot be calculated* Dosing weight: 79.9  *Administration dose not documented  Date/Time Rate/Dose/Volume Action    01/31/21 1214 *Not included in total MAR Hold   feeding supplement (ENSURE ENLIVE / ENSURE PLUS) liquid 237 mL  (mL) Total dose: Cannot be calculated* Dosing weight: 81.3  *Administration dose not documented  Date/Time Rate/Dose/Volume Action    01/31/21 1214 *Not included in total MAR Hold   furosemide (LASIX) injection 80 mg (mg) Total dose: Cannot be calculated* Dosing weight: 82  *Administration dose not documented  Date/Time Rate/Dose/Volume Action    01/31/21 1214 *Not  included in total MAR Hold   losartan (COZAAR) tablet 12.5 mg (mg) Total dose: Cannot be calculated* Dosing weight: 82  *Administration dose not documented  Date/Time Rate/Dose/Volume Action    01/31/21 1214 *Not included in total MAR Hold   spironolactone (ALDACTONE) tablet 12.5 mg (mg) Total dose: Cannot be calculated* Dosing weight: 82  *Administration dose not documented  Date/Time Rate/Dose/Volume Action    01/31/21 1214 *Not included in total MAR Hold   Sedation Time Sedation Time Physician-1: 17 minutes 24 seconds  Contrast Medication Name Total Dose  iohexol (OMNIPAQUE) 350 MG/ML injection 75 mL  Radiation/Fluoro Fluoro time: 10.9 (min)  DAP: 64332 (Gycm2)  Cumulative Air Kerma: 334 (mGy)  Coronary Findings Diagnostic Dominance: Right  Left Anterior Descending  Mid LAD lesion is 95% stenosed.  Ramus Intermedius  Ramus-1 lesion is 50% stenosed.  Ramus-2 lesion is 80% stenosed.  Right Coronary Artery  Mid RCA lesion is 30% stenosed.  Graft To Ramus  Origin lesion is 100% stenosed.  LIMA Graft To Dist LAD  Intervention  No interventions have been documented.  Right Heart Right Heart Pressures RHC Procedural Findings: Hemodynamics (mmHg) on milrinone 0.375 RA mean 18 RV 69/20 PA 76/25, mean 44 PCWP mean 22, v-waves to 37 mmHg LV 100/23 AO 100/60  Oxygen saturations: PA 58% AO 97%  Cardiac Output (Fick) 5.33  Cardiac Index (Fick) 2.67 PVR 4.1 WU  Cardiac Output (Thermo) 4.89 Cardiac Index (Thermo) 2.45  PVR 4.5 WU  PAPI: 2.8  Coronary Diagrams Diagnostic Dominance: Right   &&&&&&&&  Intervention Implants  No implant documentation for this case.   Syngo Images Show images for CARDIAC CATHETERIZATION  Images on Long Term Storage Show images for Collie, Wernick to Procedure Log   Procedure Log  Hemo Data Flowsheet Row Most Recent Value  Fick Cardiac Output 5.33 L/min  Fick Cardiac Output Index 2.67 (L/min)/BSA  Thermal Cardiac Output 4.89 L/min  Thermal Cardiac Output Index 2.45 (L/min)/BSA  RA A Wave 19 mmHg  RA V Wave 21 mmHg  RA Mean 16 mmHg  RV Systolic Pressure 71 mmHg  RV Diastolic Pressure 3 mmHg  RV EDP 18 mmHg  PA Systolic Pressure 76 mmHg  PA Diastolic Pressure 25 mmHg  PA Mean 44 mmHg  PW A Wave 25 mmHg  PW V Wave 37 mmHg  PW Mean 22 mmHg  LV Systolic Pressure 91 mmHg  LV Diastolic Pressure 4 mmHg  LV EDP 23 mmHg  AOp Systolic Pressure 97 mmHg  AOp Diastolic Pressure 62 mmHg  AOp Mean Pressure 78 mmHg  LVp Systolic Pressure 93 mmHg  LVp Diastolic Pressure 3 mmHg  LVp EDP Pressure 21 mmHg  TPVR Index 17.98 HRUI   Assessment/Plan:  This 40 year old gentleman presented with acute systolic congestive heart failure due to ischemic cardiomyopathy and 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  Echocardiogram shows an ejection fraction of 35% with severe hypokinesis of the inferior and inferolateral walls.  There are vegetations on the anterior and posterior mitral valve leaflets with lack of coaptation probably secondary to valve destruction and severe mitral regurgitation.  Cardiac catheterization shows severe pulmonary hypertension that is two thirds systemic with a large V wave.  He also has 80% stenosis of a large ramus branch with prior CABG in 2006 with a patent left internal mammary graft to the LAD.  The etiology of his endocarditis is undetermined.  He has poor dentition which could be a potential source but also presented with right middle lobe  pneumonia on CT scan of the chest with a moderate right pleural  effusion.  It is not clear to me if he had blood cultures done when at [facility] but his blood cultures at [facility] have been negative.  He has been stable on milrinone 0.375 with digoxin and diuretic therapy.  He has no clinical signs of sepsis.  I agree that he will require redo sternotomy for mitral valve replacement and bypass of the ramus branch.  He will need to undergo dental extractions first to treat any potential source of infection and prevent postoperative infectious complications.  Depending on when that is done we will determine the timing of the surgery.  I discussed all this with him and he understands and agrees to proceed.    Gaye Pollack 02/03/2021, 8:26 PM

## 2021-02-03 NOTE — Plan of Care (Signed)
  Problem: Education: Goal: Knowledge of General Education information will improve Description: Including pain rating scale, medication(s)/side effects and non-pharmacologic comfort measures Outcome: Progressing   Problem: Health Behavior/Discharge Planning: Goal: Ability to manage health-related needs will improve Outcome: Progressing   Problem: Clinical Measurements: Goal: Ability to maintain clinical measurements within normal limits will improve Outcome: Progressing Goal: Will remain free from infection Outcome: Progressing Goal: Diagnostic test results will improve Outcome: Progressing Goal: Respiratory complications will improve Outcome: Progressing Goal: Cardiovascular complication will be avoided Outcome: Progressing   Problem: Activity: Goal: Risk for activity intolerance will decrease Outcome: Progressing   Problem: Nutrition: Goal: Adequate nutrition will be maintained Outcome: Progressing   Problem: Coping: Goal: Level of anxiety will decrease Outcome: Progressing   Problem: Elimination: Goal: Will not experience complications related to bowel motility Outcome: Progressing Goal: Will not experience complications related to urinary retention Outcome: Progressing   Problem: Pain Managment: Goal: General experience of comfort will improve Outcome: Progressing   Problem: Safety: Goal: Ability to remain free from injury will improve Outcome: Progressing   Problem: Activity: Goal: Ability to return to baseline activity level will improve Outcome: Progressing   Problem: Education: Goal: Understanding of CV disease, CV risk reduction, and recovery process will improve Outcome: Progressing Goal: Individualized Educational Video(s) Outcome: Progressing   Problem: Cardiovascular: Goal: Ability to achieve and maintain adequate cardiovascular perfusion will improve Outcome: Progressing Goal: Vascular access site(s) Level 0-1 will be maintained Outcome:  Progressing   Problem: Education: Goal: Ability to demonstrate management of disease process will improve Outcome: Progressing Goal: Ability to verbalize understanding of medication therapies will improve Outcome: Progressing Goal: Individualized Educational Video(s) Outcome: Progressing   Problem: Health Behavior/Discharge Planning: Goal: Ability to safely manage health-related needs after discharge will improve Outcome: Progressing

## 2021-02-03 NOTE — TOC Initial Note (Signed)
Transition of Care [facility]) - Initial/Assessment Note    Patient Details  Name: Mark Garrett MRN: [id number] Date of Birth: Jul 14, 1980  [facility]) CM/SW Contact:    Bonner Larue, LCSWA Phone Number: 02/03/2021, 4:59 PM  Clinical Narrative:                 HF CSW spoke with Mark Garrett at bedside and completed a very brief SDOH with the patient who reported that he doesn't have a phone and hasn't had a phone for several years. CSW will see about getting Mark Garrett a cell phone before discharge. Mark Garrett reported that he doesn't have any running water or power at his house and that a tree fell on his home and he doesn't have home insurance. Mark Garrett reported that he does get Food Stamps roughly $250 a month and that it does last him through the month however he doesn't have a fridge to keep things cold. 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 Mark Garrett reported that he doesn't have health insurance and has tried applying for Medicaid and disability but that disability has denied him since he owns his house. Patient reported they do have a PCP, a free clinic he goes to sometimes in [facility] and he was previously on med assist for medications but needs to resubmit an application. CSW provided the patient with the social workers name and position and if anything changes to please reach out so that the CSW can provide support and that the CSW will be in touch about getting need support/resources. Mark Garrett is agreeable for the CSW to reach out to [employer] to screen him for Medicaid.  HF CSW reached out to [place], HF outpatient CSW regarding social needs such as a cell phone and other needed resources and CSW will follow up tomorrow with Annice Pih for further support.  CSW will continue to follow throughout discharge.  Expected Discharge Plan: Home/Self Care Barriers to Discharge: Continued  Medical Work up   Patient Goals and CMS Choice Patient states their goals for this hospitalization and ongoing recovery are:: to return to home CMS Medicare.gov Compare Post Acute Care list provided to:: Patient    Expected Discharge Plan and Services Expected Discharge Plan: Home/Self Care In-house Referral: Clinical Social Work Discharge Planning Services: CM Consult, Indigent Health Clinic, MATCH Program, Medication Assistance, Follow-up appt scheduled   Living arrangements for the past 2 months: Single Family Home                   DME Agency: NA       HH Arranged: NA          Prior Living Arrangements/Services Living arrangements for the past 2 months: Single Family Home Lives with:: Self Patient language and need for interpreter reviewed:: Yes Do you feel safe going back to the place where you live?: Yes      Need for Family Participation in Patient Care: No (Comment) Care giver support system in place?: No (comment)   Criminal Activity/Legal Involvement Pertinent to Current Situation/Hospitalization: No - Comment as needed  Activities of Daily Living Home Assistive Devices/Equipment: Prosthesis ADL Screening (condition at time of admission) Patient's cognitive ability adequate to safely complete daily activities?: Yes Is the patient deaf or have difficulty hearing?: No Does the patient have difficulty seeing, even when wearing glasses/contacts?: No Does the patient have difficulty  concentrating, remembering, or making decisions?: No Patient able to express need for assistance with ADLs?: Yes Does the patient have difficulty dressing or bathing?: No Independently performs ADLs?: Yes (appropriate for developmental age) Does the patient have difficulty walking or climbing stairs?: No Weakness of Legs: Left Weakness of Arms/Hands: None  Permission Sought/Granted   Permission granted to share information with : No              Emotional  Assessment Appearance:: Appears stated age Attitude/Demeanor/Rapport: Engaged Affect (typically observed): Pleasant Orientation: : Oriented to Self, Oriented to Place, Oriented to  Time, Oriented to Situation Alcohol / Substance Use: Not Applicable Psych Involvement: No (comment)  Admission diagnosis:  CAP (community acquired pneumonia) [J18.9] Deep vein thrombosis (DVT) of popliteal vein of both lower extremities, unspecified chronicity (HCC) [I82.433] Patient Active Problem List   Diagnosis Date Noted   Endocarditis of mitral valve    Long term current use of anticoagulant therapy    Encounter for preoperative dental examination    Caries    Retained tooth root    Chronic apical periodontitis    Accretions on teeth    Teeth missing    Excessive dental attrition    Chronic periodontitis    Cardiogenic shock (HCC) 01/30/2021   Severe mitral regurgitation 01/29/2021   Serum total bilirubin elevated 01/29/2021   Paroxysmal VT 01/29/2021   Acute systolic CHF (congestive heart failure) (HCC) 01/28/2021   CAP (community acquired pneumonia) 01/27/2021   Pleural effusion on right 01/27/2021   Leukocytosis 01/27/2021   Microcytic anemia 01/27/2021   Hypoalbuminemia 01/27/2021   Elevated brain natriuretic peptide (BNP) level 01/27/2021   NSTEMI (non-ST elevated myocardial infarction) (HCC) 01/27/2021   DVT (deep venous thrombosis) (HCC) 01/27/2021   Diarrhea 01/27/2021   Cigarette smoker 01/27/2021   COPD (chronic obstructive pulmonary disease) (HCC) 01/27/2021   Asthma 01/27/2021   PCP:  Oneita Hurt, No Pharmacy:   [facility] [phone] - [place], [place] - [address] [place] [address] Phone: [phone] Fax: [phone]  [name] [address] [place] [address] Phone: [phone] Fax: [phone]     Social Determinants of Health (SDOH) Interventions Food Insecurity Interventions:  Other (Comment) (Patient has food stamps $250 a month) Financial Strain Interventions: Intervention Not Indicated, Artist (referral to [employer] to screen for Medicaid, patient agreeable) Housing Interventions: Intervention Not Indicated Transportation Interventions: Retail banker  Readmission Risk Interventions No flowsheet data found.  Rebbeca Sheperd, MSW, LCSWA [phone] Heart Failure Social Worker

## 2021-02-03 NOTE — Progress Notes (Signed)
Nutrition Follow-up  DOCUMENTATION CODES:   Severe malnutrition in context of chronic illness (component of social-environmental as well)  INTERVENTION:   Continue Ensure Enlive po, increase to TID, each supplement provides 350 kcal and 20 grams of protein  Continue MVI with Minerals  Recommend addition of bowel regimen given constipation  Recommend checking phosphorus  NUTRITION DIAGNOSIS:   Severe Malnutrition related to chronic illness (CHF exacerbation) as evidenced by percent weight loss, severe fat depletion, energy intake < 75% for > or equal to 1 month.  Being addressed via supplements  GOAL:   Patient will meet greater than or equal to 90% of their needs  Progressing  MONITOR:   PO intake, Supplement acceptance, Labs, Weight trends, I & O's  REASON FOR ASSESSMENT:   Consult Assessment of nutrition requirement/status  ASSESSMENT:   Pt with PMH significant for asthma, COPD, tobacco abuse, s/p aortic revascularization, and DVT admitted with acute hypoxemic respiratory failure 2/2 acute systolic CHF exacerbation.  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 Pt also reports drinking at least 1 Ensure per day  Pt reports he has lost over 100 pounds in 1 year. Pt reports UBW around 240 pounds. Current wt 152 pounds; >35% wt loss in 1 year.  Net negative almost 30 L since admission  Pt reports no changes in appetite but does report food insecurity. Pt reports financial struggles but reports he has food stamps but transportation to the store is an issue as is food storage. Pt reports he used to lives with his parents but has now moved back into his own place. He currently does not have electricity, unable to store cold food. Pt reports he normally does not eat more than 1 meal per day but sometimes will  go 3-4 days without eating. Currently, he reports a meal might be a pre-made roast beef sandwich from the grocery store. Reached out to Social Work to ensure they are aware of social issues  No BM since 11/13-no bowel regimen  Pt with ampuation to L foot, has prosthetic which he had not been wearing due to edema. Pt is able ambulate some and perform ADLs. Pt also utilizes a scooter  Labs: reviewed Meds: MVI with Minerals, Kcl, mag sulfate  NUTRITION - FOCUSED PHYSICAL EXAM:  Flowsheet Row Most Recent Value  Orbital Region Moderate depletion  Upper Arm Region Mild depletion  Thoracic and Lumbar Region Moderate depletion  Buccal Region Moderate depletion  Temple Region Moderate depletion  Clavicle Bone Region Moderate depletion  Clavicle and Acromion Bone Region Severe depletion  Scapular Bone Region Severe depletion  Dorsal Hand Mild depletion  Patellar Region Moderate depletion  Anterior Thigh Region Moderate depletion  Posterior Calf Region Moderate depletion  Edema (RD Assessment) Mild        Diet Order:   Diet Order             Diet 2 gram sodium Room service appropriate? Yes; Fluid consistency: Thin  Diet effective now                   EDUCATION NEEDS:   No education needs have been identified at this time  Skin:  Skin Assessment: Reviewed RN Assessment  Last BM:  11/13  Height:   Ht Readings from Last 1 Encounters:  02/02/21 5\' 11"  (1.803 m)  Weight:   Wt Readings from Last 1 Encounters:  02/03/21 68.9 kg   BMI:  Body mass index is 21.19 kg/m.  Estimated Nutritional Needs:   Kcal:  2000-2200  Protein:  100-110 grams  Fluid:  >/=2L   Romelle Starcher MS, RDN, LDN, CNSC Registered Dietitian III Clinical Nutrition RD Pager and On-Call Pager Number Located in [place]

## 2021-02-04 ENCOUNTER — Telehealth: Payer: Self-pay

## 2021-02-04 ENCOUNTER — Inpatient Hospital Stay (HOSPITAL_COMMUNITY): Payer: Self-pay

## 2021-02-04 ENCOUNTER — Encounter (HOSPITAL_COMMUNITY): Payer: Self-pay | Admitting: Cardiology

## 2021-02-04 DIAGNOSIS — Z01810 Encounter for preprocedural cardiovascular examination: Secondary | ICD-10-CM

## 2021-02-04 LAB — BASIC METABOLIC PANEL
Anion gap: 10 (ref 5–15)
BUN: 19 mg/dL (ref 6–20)
CO2: 30 mmol/L (ref 22–32)
Calcium: 9.2 mg/dL (ref 8.9–10.3)
Chloride: 90 mmol/L — ABNORMAL LOW (ref 98–111)
Creatinine, Ser: 1.47 mg/dL — ABNORMAL HIGH (ref 0.61–1.24)
GFR, Estimated: >60 mL/min (ref >60)
Glucose, Bld: 95 mg/dL (ref 70–99)
Potassium: 3.8 mmol/L (ref 3.5–5.1)
Sodium: 130 mmol/L — ABNORMAL LOW (ref 135–145)

## 2021-02-04 LAB — RPR: RPR Ser Ql: NONREACTIVE

## 2021-02-04 LAB — CBC
HCT: 37.9 % — ABNORMAL LOW (ref 39.0–52.0)
Hemoglobin: 10.7 g/dL — ABNORMAL LOW (ref 13.0–17.0)
MCH: 19.5 pg — ABNORMAL LOW (ref 26.0–34.0)
MCHC: 28.2 g/dL — ABNORMAL LOW (ref 30.0–36.0)
MCV: 69.2 fL — ABNORMAL LOW (ref 80.0–100.0)
Platelets: 208 10*3/uL (ref 150–400)
RBC: 5.48 MIL/uL (ref 4.22–5.81)
RDW: 25.4 % — ABNORMAL HIGH (ref 11.5–15.5)
WBC: 13.3 10*3/uL — ABNORMAL HIGH (ref 4.0–10.5)
nRBC: 0.3 % — ABNORMAL HIGH (ref 0.0–0.2)

## 2021-02-04 LAB — MAGNESIUM: Magnesium: 2.1 mg/dL (ref 1.7–2.4)

## 2021-02-04 LAB — HIV-1 RNA QUANT-NO REFLEX-BLD
HIV 1 RNA Quant: <20 copies/mL
LOG10 HIV-1 RNA: UNDETERMINED log10copy/mL

## 2021-02-04 LAB — LEGIONELLA PNEUMOPHILA TOTAL AB: Legionella Pneumo Total Ab: <0.91 OD ratio (ref 0.00–0.90)

## 2021-02-04 LAB — COOXEMETRY PANEL
Carboxyhemoglobin: 1.8 % — ABNORMAL HIGH (ref 0.5–1.5)
Methemoglobin: 0.8 % (ref 0.0–1.5)
O2 Saturation: 66.8 %
Total hemoglobin: 11.1 g/dL — ABNORMAL LOW (ref 12.0–16.0)

## 2021-02-04 LAB — ANTISTREPTOLYSIN O TITER: ASO: 24 IU/mL (ref 0.0–200.0)

## 2021-02-04 LAB — BRUCELLA ANTIBODY IGM, EIA: Brucella Antibody IgM, EIA: NEGATIVE

## 2021-02-04 LAB — BRUCELLA ANTIBODY IGG, EIA: Brucella Antibody IgG, EIA: NEGATIVE

## 2021-02-04 LAB — HEPARIN LEVEL (UNFRACTIONATED): Heparin Unfractionated: 0.48 IU/mL (ref 0.30–0.70)

## 2021-02-04 MED ORDER — MAGNESIUM HYDROXIDE 400 MG/5ML PO SUSP
30.0000 mL | Freq: Every day | ORAL | Status: DC | PRN
  Administered 2021-02-04: 30 mL via ORAL
  Filled 2021-02-04: qty 30

## 2021-02-04 MED ORDER — POTASSIUM CHLORIDE CRYS ER 20 MEQ PO TBCR
40.0000 meq | EXTENDED_RELEASE_TABLET | Freq: Once | ORAL | Status: AC
  Administered 2021-02-04: 40 meq via ORAL
  Filled 2021-02-04: qty 2

## 2021-02-04 NOTE — Progress Notes (Signed)
Mobility Specialist: Progress Note   02/04/21 1611  Mobility  Activity Ambulated in hall  Level of Assistance Contact guard assist, steadying assist  Assistive Device None  Distance Ambulated (ft) 240 ft  Mobility Ambulated with assistance in hallway  Mobility Response Tolerated well  Mobility performed by Mobility specialist  $Mobility charge 1 Mobility   Pre-Mobility: 81 HR, 99% SpO2 Post-Mobility: 75 HR, 97/79 BP, 99% SpO2  Pt independent to sit EOB as well as to stand. Recorded pt's daily weight for RN and then pt agreeable to ambulation. Pt a little unsteady during ambulation d/t not having prosthesis with him, no c/o throughout. Pt back to bed with call bell and phone at his side.   [facility] Yuri Fana Mobility Specialist Mobility Specialist Phone #1: [phone] Mobility Specialist Phone #2: [phone]

## 2021-02-04 NOTE — Progress Notes (Signed)
Pre-CABG exams have been completed.   Results can be found under chart review under CV PROC. 02/04/2021 11:41 AM Reighlynn Swiney RVT, RDMS

## 2021-02-04 NOTE — TOC Progression Note (Addendum)
Transition of Care [facility]) - Progression Note    Patient Details  Name: Mark Garrett MRN: [id number] Date of Birth: 04/26/1980  Transition of Care [facility] CM/SW Contact  Corderius Saraceni, LCSWA Phone Number: 02/04/2021, 4:57 PM  Clinical Narrative:    HF CSW was able to provide Mark Garrett with a tracfone/cell phone [phone] with the help of the HF team. CSW spoke with Mark Garrett at bedside and provided him with the phone and number of the tracfone. CSW discussed again with Mark Garrett about [employer] and provided him with information about [employer] including their phone number for contact. CSW will enroll Mark Garrett in [employer]. Mark Garrett reported he will be here through the weekend and the CSW will follow up with him on Monday if he is still in the hospital as the CSW doesn't work over the weekend.  Enrollment sent to [employer].  CSW will continue to follow throughout discharge.   Expected Discharge Plan: Home/Self Care Barriers to Discharge: Continued Medical Work up  Expected Discharge Plan and Services Expected Discharge Plan: Home/Self Care In-house Referral: Clinical Social Work Discharge Planning Services: CM Consult, Indigent Health Clinic, MATCH Program, Medication Assistance, Follow-up appt scheduled   Living arrangements for the past 2 months: Single Family Home                   DME Agency: NA       HH Arranged: NA           Social Determinants of Health (SDOH) Interventions Food Insecurity Interventions: Other (Comment) (Patient has food stamps $250 a month) Financial Strain Interventions: Intervention Not Indicated, Artist (referral to [employer] to screen for Medicaid, patient agreeable) Housing Interventions: Intervention Not Indicated Transportation Interventions: Retail banker  Readmission Risk Interventions No flowsheet data found.  Ilisa Hayworth, MSW, LCSWA [phone] Heart  Failure Social Worker

## 2021-02-04 NOTE — Progress Notes (Signed)
Patient ID: Mark Garrett, male   DOB: [birth year], 40 y.o.   MRN: [id number]      Advanced Heart Failure Rounding Note  PCP-Cardiologist: None   Subjective:    This morning, he is on milrinone 0.375 with co-ox 67%.  CVP 2-3.  Creatinine 1.27 => 1.47.   He is in NSR on amiodarone gtt with rare PVCs.   TEE (11/16): EF 35-40%, there was mobile vegetation on both the anterior and posterior mitral leaflets.  The leaflets did not completely coapt (?destruction from endocarditis) with severe mitral regurgitation (ERO 0.56 cm^2).  There was extensive plaque in the proximal descending thoracic aorta with what appears to be mobile vegetation attached to it.   LHC/RHC:   Coronary Findings  Diagnostic Dominance: Right Left Anterior Descending  Mid LAD lesion is 95% stenosed.  Ramus Intermedius  Ramus-1 lesion is 50% stenosed.  Ramus-2 lesion is 80% stenosed.  Right Coronary Artery  Mid RCA lesion is 30% stenosed.  Graft To PLV  Origin lesion is 100% stenosed.  LIMA Graft To Dist LAD  Intervention  No interventions have been documented. Right Heart  Right Heart Pressures RHC Procedural Findings: Hemodynamics (mmHg) on milrinone 0.375 RA mean 18 RV 69/20 PA 76/25, mean 44 PCWP mean 22, v-waves to 37 mmHg LV 100/23 AO 100/60  Oxygen saturations: PA 58% AO 97%  Cardiac Output (Fick) 5.33  Cardiac Index (Fick) 2.67 PVR 4.1 WU  Cardiac Output (Thermo) 4.89 Cardiac Index (Thermo) 2.45  PVR 4.5 WU  PAPI: 2.8    Objective:   Weight Range: 68.9 kg Body mass index is 21.19 kg/m.   Vital Signs:   Temp:  [97.9 F (36.6 C)-98.4 F (36.9 C)] 97.9 F (36.6 C) (11/18 0300) Pulse Rate:  [70-102] 87 (11/18 0700) Resp:  [12-19] 17 (11/18 0700) BP: (82-187)/(62-105) 104/81 (11/18 0700) SpO2:  [94 %-100 %] 97 % (11/18 0700) Last BM Date: 01/30/21  Weight change: Filed Weights   02/02/21 0500 02/02/21 1217 02/03/21 0500  Weight: 70.9 kg 70.9 kg 68.9 kg     Intake/Output:   Intake/Output Summary (Last 24 hours) at 02/04/2021 0827 Last data filed at 02/04/2021 0700 Gross per 24 hour  Intake 2050.24 ml  Output 2125 ml  Net -74.76 ml      Physical Exam    General: NAD Neck: No JVD, no thyromegaly or thyroid nodule.  Lungs: Clear to auscultation bilaterally with normal respiratory effort. CV: Nondisplaced PMI.  Heart regular S1/S2, no S3/S4, 3/6 HSM.  No peripheral edema.   Abdomen: Soft, nontender, no hepatosplenomegaly, no distention.  Skin: Intact without lesions or rashes.  Neurologic: Alert and oriented x 3.  Psych: Normal affect. Extremities: No clubbing or cyanosis.  HEENT: Normal.    Telemetry   NSR, no further NSVT overnight. Personally reviewed  Labs    CBC Recent Labs    02/02/21 0500 02/03/21 0405  WBC 12.1* 13.4*  HGB 10.4* 10.9*  HCT 36.9* 37.6*  MCV 67.8* 68.1*  PLT 193 818   Basic Metabolic Panel Recent Labs    02/03/21 0405 02/04/21 0309  NA 132* 130*  K 3.7 3.8  CL 91* 90*  CO2 32 30  GLUCOSE 88 95  BUN 16 19  CREATININE 1.27* 1.47*  CALCIUM 8.9 9.2  MG 2.1 2.1  PHOS 3.7  --    Liver Function Tests No results for input(s): AST, ALT, ALKPHOS, BILITOT, PROT, ALBUMIN in the last 72 hours.  No results for input(s): LIPASE, AMYLASE in  the last 72 hours. Cardiac Enzymes No results for input(s): CKTOTAL, CKMB, CKMBINDEX, TROPONINI in the last 72 hours.  BNP: BNP (last 3 results) Recent Labs    01/27/21 1034  BNP 1,733.0*    ProBNP (last 3 results) No results for input(s): PROBNP in the last 8760 hours.   D-Dimer No results for input(s): DDIMER in the last 72 hours. Hemoglobin A1C No results for input(s): HGBA1C in the last 72 hours. Fasting Lipid Panel No results for input(s): CHOL, HDL, LDLCALC, TRIG, CHOLHDL, LDLDIRECT in the last 72 hours.  Thyroid Function Tests No results for input(s): TSH, T4TOTAL, T3FREE, THYROIDAB in the last 72 hours.  Invalid input(s):  FREET3  Other results:   Imaging    No results found.   Medications:     Scheduled Medications:  aspirin  81 mg Oral Daily   Chlorhexidine Gluconate Cloth  6 each Topical Daily   dextromethorphan-guaiFENesin  1 tablet Oral BID   digoxin  0.125 mg Oral Daily   feeding supplement  237 mL Oral TID BM   Living Better with Heart Failure Book   Does not apply Once   multivitamin with minerals  1 tablet Oral Daily   nicotine  21 mg Transdermal QHS   pantoprazole  40 mg Oral Daily   potassium chloride  40 mEq Oral Once   rosuvastatin  40 mg Oral Daily   sodium chloride flush  10-40 mL Intracatheter Q12H   sodium chloride flush  3 mL Intravenous Q12H   sodium chloride flush  3 mL Intravenous Q12H   spironolactone  12.5 mg Oral Daily   Thrombi-Pad  1 each Topical Once    Infusions:  sodium chloride     sodium chloride Stopped (01/31/21 2357)   amiodarone 30 mg/hr (02/04/21 0700)   heparin 1,200 Units/hr (02/04/21 0700)   magnesium sulfate bolus IVPB     milrinone 0.375 mcg/kg/min (02/04/21 0700)   norepinephrine (LEVOPHED) Adult infusion Stopped (02/02/21 1739)    PRN Medications: sodium chloride, Place/Maintain arterial line **AND** sodium chloride, acetaminophen, ondansetron (ZOFRAN) IV, sodium chloride flush, sodium chloride flush    Assessment/Plan   1. Acute systolic CHF: Ischemic cardiomyopathy, symptoms worsened by severe MR.  I reviewed the echo done today, showing EF 35-40%, severe hypokinesis inferior and inferolateral walls, moderate LV dilation with mild LVH, mild RV dilation with moderately decreased systolic function, restricted posterior mitral leaflet and calcified mitral valve with severe mitral regurgitation and at least mild mitral stenosis (mean gradient 8 mmHg), PASP 60. Per report, echo at [facility] showed EF 55-60% with severe MR in 10/22.  It was a technically difficult study per report.  It is possible that he had a new ACS event prior to admission with  reported drop in EF though HS-TnI with mild elevation and no trend suggests that it was not immediately prior to admission.  Low output noted with co-ox 39% initially, lactate elevated when he was initially admitted.  Currently on milrinone 0.375, am co-ox 67%.  CVP now 2-3. - Continue milrinone 0.375 in setting of severe MR.   - Continue digoxin 0.125 daily.  - Hold torsemide today, reassess tomorrow.    - Continue spironolactone 12.5 daily.  - Hold ARB/ARNI with soft BP.   2. CAD: S/p CABG 2006.  As above, with fall in EF and CHF exacerbation, possible ACS prior to admission.  However, mild HS-TnI elevation with no trend suggests that ACS was not immediately prior to admission.  Current elevation is likely  demand ischemia from volume overload. LHC on 11/14 showed patent LIMA-LAD with SVG occluded at aorta (does not look new); there was complex 80% proximal ramus stenosis.  Initially assumed that the SVG was to the ramus, but was subsequently got the CABG operative report from [place] and it looks like it was an SVG-PLV.  There was minimal native RCA disease. LAD territory well-supplied by LIMA.  No chest pain.  - Continue heparin gtt while off Xarelto.  - ASA 81 - Crestor 40 - Will need SVG-ramus with valve surgery.   3.  Mitral valve disease: Initial echo showed posterior MV leaflet restricted with severe MR, suspected primarily infarct-related MR given LV dilation and inferior/inferolateral severe hypokinesis.  However, TEE on 11/16 showed vegetation (not bulky but clearly present) on the posterior and anterior leaflets with poor leaflet coaptation, suggesting endocarditis may be the major cause of severe MR.  Reviewed with ID, suspect the mitral vegetations have been present for a while (severe MR on 10/22 echo as well, does not appear to have active infection).  - Seen by Dr. Cyndia Bent, will need mitral valve replacement hopefully next week.  - Will need decayed teeth removed prior, Dr. Benson Norway  following.  4. 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  ?due to sedentary lifestyle + ?genetic predisposition.   - Was on Xarelto PTA, now heparin gtt.  Restart Xarelto prior to discharge.  5. Smoking: I strongly encouraged him to quit.  6. PNA: WBCs 21 => 13 => 9 => 13.4, afebrile.  Cultures NGTD.  RML PNA on CT chest.  - He completed course of abx initially for PNA.  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 He is able to walk on the stump.  - PT.  8. 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  There is also mobile vegetation that appears adherent to plaque in the proximal descending thoracic aorta.  CT head showed no evidence for embolic disease.  Possible source would be due to poor dentition. Blood cultures from 11/10 and 11/16 NGTD.  He was initially on abx for PNA.  We re-drew cultures on 11/16.  ESR is 1, CRP 1.7.  WBCs elevated but afebrile.  At this point, I am not sure that we are going to find a source by culture as he has been already partially treated and suspect that the endocarditis is old as he had severe MR also on echo in 10/22. ID following.  - Repeat blood cultures today.  - Stop antibiotics.    Need to mobilize. OK for step down.    Length of Stay: 8  Loralie Champagne, MD  02/04/2021, 8:27 AM  Advanced Heart Failure Team Pager [phone] (M-F; 7a - 5p)  Please contact [facility] for night-coverage after hours (5p -7a ) and weekends on [email or web address]

## 2021-02-04 NOTE — Progress Notes (Addendum)
ANTICOAGULATION CONSULT NOTE  Pharmacy Consult IV heparin Indication: pre-existing DVT  Allergies  Allergen Reactions   Iodide Rash    Patient Measurements: Height: 5\' 11"  (180.3 cm) Weight: 68.9 kg (151 lb 14.4 oz) (standing) IBW/kg (Calculated) : 75.3  Heparin Dosing Weight: 72.6 kg  Vital Signs: Temp: 98.2 F (36.8 C) (11/18 0832) Temp Source: Oral (11/18 0832) BP: 90/69 (11/18 0900) Pulse Rate: 78 (11/18 0900)  Labs: Recent Labs    02/01/21 1329 02/01/21 1331 02/02/21 0500 02/03/21 0405 02/04/21 0309 02/04/21 0805  HGB  --    < > 10.4* 10.9*  --  10.7*  HCT  --   --  36.9* 37.6*  --  37.9*  PLT  --   --  193 224  --  208  APTT 79*  --   --   --   --   --   HEPARINUNFRC  --    < > 0.56 0.47 0.48  --   CREATININE  --   --  1.22 1.27* 1.47*  --    < > = values in this interval not displayed.    Estimated Creatinine Clearance: 63.8 mL/min (A) (by C-G formula based on SCr of 1.47 mg/dL (H)).   Medications:  Medications Prior to Admission  Medication Sig Dispense Refill Last Dose   XARELTO 20 MG TABS tablet Take 20 mg by mouth every evening.   01/25/2021 at 1900   Infusions:   sodium chloride     sodium chloride Stopped (01/31/21 2357)   amiodarone 30 mg/hr (02/04/21 0700)   heparin 1,200 Units/hr (02/04/21 0700)   magnesium sulfate bolus IVPB     milrinone 0.375 mcg/kg/min (02/04/21 0700)   norepinephrine (LEVOPHED) Adult infusion Stopped (02/02/21 1739)    Assessment: 40 y.o. male with medical history significant for asthma/COPD, recent DVT, tobacco abuse who presented with 3 days SOB and CP. CT showed no evidence of pulmonary embolism.. 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 Pharmacy consulted to initiate heparin infusion.   Heparin level of 0.47 is therapeutic on 1200 units/hr. No s/sx of bleeding or infusion issues. CBC stable.  Goal of Therapy:  Heparin level 0.3-0.7 units/ml Monitor platelets by  anticoagulation protocol: Yes   Plan:  Continue heparin infusion at 1200 units/hr Continue to monitor daily CBC, heparin level Monitor s/sx bleeding F/u TCTS plans  45, Reece Leader, [facility] Clinical Pharmacist  02/04/2021 10:45 AM   [facility] pharmacy phone numbers are listed on [email or web address]

## 2021-02-04 NOTE — Progress Notes (Signed)
Pt admitted to [facility] from [facility] 2H.  Pt is A&O X4 and neuro intact.  CHG bath completed.  Pt placed on telemetry and CCMD notified. Vitals taken and within normal range.  Pt is currently comfortable and not in pain.

## 2021-02-04 NOTE — Progress Notes (Signed)
CARDIAC REHAB PHASE I   Went to offer to walk with pt. Echo at bedside. Will f/u later to ambulate as able.  Reynold Bowen, RN BSN 02/04/2021 11:21 AM

## 2021-02-04 NOTE — Progress Notes (Signed)
RN entered room at 0310 to draw labs. RN noticed that patient milrinone and amiodarone were both not infusing. RN asked patient if he had turned them off. Patient replied that he did turn them off and that they said "infusion complete".   MAR states that the amiodarone had been stopped at 0240, and the milrinone had been stopped at 0208. Both medications restarted at 0310.   RN educated patient not to touch IV pumps and that the medication infusing was helping to medically support the patient.   Will continue to monitor.

## 2021-02-04 NOTE — Telephone Encounter (Signed)
   Mark Garrett DOB: Mar 16, 1981 MRN: [id number]   RIDER WAIVER AND RELEASE OF LIABILITY  For purposes of improving physical access to our facilities, [facility] is pleased to partner with third parties to provide [facility] patients or other authorized individuals the option of convenient, on-demand ground transportation services (the Chiropractor") through use of the technology service that enables users to request on-demand ground transportation from independent third-party providers.  By opting to use and accept these [employer], I, the undersigned, hereby agree on behalf of myself, and on behalf of any minor child using the Science writer for whom I am the parent or legal guardian, as follows:  Science writer provided to me are provided by independent third-party transportation providers who are not [employer] or employees and who are unaffiliated with [employer]. [address] is neither a transportation carrier nor a common or public carrier. [facility] has no control over the quality or safety of the transportation that occurs as a result of the [employer]. [facility] cannot guarantee that any third-party transportation provider will complete any arranged transportation service. [facility] makes no representation, warranty, or guarantee regarding the reliability, timeliness, quality, safety, suitability, or availability of any of the Transport Services or that they will be error free. I fully understand that traveling by vehicle involves risks and dangers of serious bodily injury, including permanent disability, paralysis, and death. I agree, on behalf of myself and on behalf of any minor child using the Transport Services for whom I am the parent or legal guardian, that the entire risk arising out of my use of the [employer] remains solely with me, to the maximum extent permitted under applicable law. The [employer] are provided "as  is" and "as available." [facility] disclaims all representations and warranties, express, implied or statutory, not expressly set out in these terms, including the implied warranties of merchantability and fitness for a particular purpose. I hereby waive and release [facility], its agents, employees, officers, directors, representatives, insurers, attorneys, assigns, successors, subsidiaries, and affiliates from any and all past, present, or future claims, demands, liabilities, actions, causes of action, or suits of any kind directly or indirectly arising from acceptance and use of the [employer]. I further waive and release [facility] and its affiliates from all present and future liability and responsibility for any injury or death to persons or damages to property caused by or related to the use of the [employer]. I have read this Waiver and Release of Liability, and I understand the terms used in it and their legal significance. This Waiver is freely and voluntarily given with the understanding that my right (as well as the right of any minor child for whom I am the parent or legal guardian using the [employer]) to legal recourse against [facility] in connection with the [employer] is knowingly surrendered in return for use of these services.   I attest that I read the consent document to Mark Garrett, gave Mr. Schupp the opportunity to ask questions and answered the questions asked (if any). I affirm that Mark Garrett then provided consent for he's participation in this program.     Mark Garrett

## 2021-02-04 NOTE — TOC Progression Note (Signed)
Readjusted milrinone rate to 9.23 mL/hr per pharmacist verbal order.

## 2021-02-04 NOTE — Progress Notes (Signed)
[facility]  Date of Admission:  01/27/2021     Total days of antibiotics          ASSESSMENT:  Mark Garrett's repeat blood culture has been without growth to date. He has minimal risk factors that would be concerning for this to be bacterial endocarditis.  At this point it appears reasonable to monitor off antibiotics while awaiting mitral valve replacement which appears tentatively planned for 02/17/21. Would request culture during surgery to check vegetation and have low threshold to restart antibiotics with vancomycin or ceftriaxone should signs of infection develop. ID will continue to monitor cultures. Remaining medical and supportive care per primary team.   PLAN:  Hold antibiotics unless signs of infection develop Monitor cultures for any organisms/bacteremia. Remaining medical and supportive care per primary team.   Principal Problem:   Acute systolic CHF (congestive heart failure) (HCC) Active Problems:   CAP (community acquired pneumonia)   Pleural effusion on right   Leukocytosis   Microcytic anemia   NSTEMI (non-ST elevated myocardial infarction) (HCC)   DVT (deep venous thrombosis) (HCC)   Cigarette smoker   COPD (chronic obstructive pulmonary disease) (HCC)   Asthma   Severe mitral regurgitation   Serum total bilirubin elevated   Paroxysmal VT   Cardiogenic shock (HCC)   Endocarditis of mitral valve   Long term current use of anticoagulant therapy   Encounter for preoperative dental examination   Caries   Retained tooth root   Chronic apical periodontitis   Accretions on teeth   Teeth missing   Excessive dental attrition   Chronic periodontitis   Protein-calorie malnutrition, severe    aspirin  81 mg Oral Daily   Chlorhexidine Gluconate Cloth  6 each Topical Daily   dextromethorphan-guaiFENesin  1 tablet Oral BID   digoxin  0.125 mg Oral Daily   feeding supplement  237 mL Oral TID BM   Living Better with Heart Failure  Book   Does not apply Once   multivitamin with minerals  1 tablet Oral Daily   nicotine  21 mg Transdermal QHS   pantoprazole  40 mg Oral Daily   rosuvastatin  40 mg Oral Daily   sodium chloride flush  10-40 mL Intracatheter Q12H   sodium chloride flush  3 mL Intravenous Q12H   sodium chloride flush  3 mL Intravenous Q12H   spironolactone  12.5 mg Oral Daily   Thrombi-Pad  1 each Topical Once    SUBJECTIVE:  Afebrile overnight with no acute events. No new concerns/complaints.   Allergies  Allergen Reactions   Iodide Rash     Review of Systems: Review of Systems  Constitutional:  Negative for chills, fever and weight loss.  Respiratory:  Negative for cough, shortness of breath and wheezing.   Cardiovascular:  Negative for chest pain and leg swelling.  Gastrointestinal:  Negative for abdominal pain, constipation, diarrhea, nausea and vomiting.  Skin:  Negative for rash.     OBJECTIVE: Vitals:   02/04/21 0700 02/04/21 0800 02/04/21 0832 02/04/21 0900  BP: 104/81 91/70  90/69  Pulse: 87 78  78  Resp: 17 15  11   Temp:   98.2 F (36.8 C)   TempSrc:   Oral   SpO2: 97% 97%  98%  Weight:      Height:       Body mass index is 21.19 kg/m.  Physical Exam Constitutional:      General: He is not in acute distress.  Appearance: He is well-developed.     Comments: Lying in bed; resting; pleasant.   Cardiovascular:     Rate and Rhythm: Normal rate and regular rhythm.     Heart sounds: Normal heart sounds.  Pulmonary:     Effort: Pulmonary effort is normal.     Breath sounds: Normal breath sounds.  Skin:    General: Skin is warm and dry.  Neurological:     Mental Status: He is alert and oriented to person, place, and time.  Psychiatric:        Behavior: Behavior normal.        Thought Content: Thought content normal.        Judgment: Judgment normal.    Lab Results Lab Results  Component Value Date   WBC 13.3 (H) 02/04/2021   HGB 10.7 (L) 02/04/2021   HCT  37.9 (L) 02/04/2021   MCV 69.2 (L) 02/04/2021   PLT 208 02/04/2021    Lab Results  Component Value Date   CREATININE 1.47 (H) 02/04/2021   BUN 19 02/04/2021   NA 130 (L) 02/04/2021   K 3.8 02/04/2021   CL 90 (L) 02/04/2021   CO2 30 02/04/2021    Lab Results  Component Value Date   ALT 29 02/01/2021   AST 27 02/01/2021   ALKPHOS 88 02/01/2021   BILITOT 2.9 (H) 02/01/2021     Microbiology: Recent Results (from the past 240 hour(s))  Resp Panel by RT-PCR (Flu A&B, Covid) Nasopharyngeal Swab     Status: None   Collection Time: 01/27/21 10:09 AM   Specimen: Nasopharyngeal Swab; Nasopharyngeal(NP) swabs in vial transport medium  Result Value Ref Range Status   SARS Coronavirus 2 by RT PCR NEGATIVE NEGATIVE Final    Comment: (NOTE) SARS-CoV-2 target nucleic acids are NOT DETECTED.  The SARS-CoV-2 RNA is generally detectable in upper respiratory specimens during the acute phase of infection. The lowest concentration of SARS-CoV-2 viral copies this assay can detect is 138 copies/mL. A negative result does not preclude SARS-Cov-2 infection and should not be used as the sole basis for treatment or other patient management decisions. A negative result may occur with  improper specimen collection/handling, submission of specimen other than nasopharyngeal swab, presence of viral mutation(s) within the areas targeted by this assay, and inadequate number of viral copies(<138 copies/mL). A negative result must be combined with clinical observations, patient history, and epidemiological information. The expected result is Negative.  Fact Sheet for Patients:  EntrepreneurPulse.com.au  Fact Sheet for Healthcare Providers:  IncredibleEmployment.be  This test is no t yet approved or cleared by the [place] FDA and  has been authorized for detection and/or diagnosis of SARS-CoV-2 by FDA under an Emergency Use Authorization (EUA). This EUA will  remain  in effect (meaning this test can be used) for the duration of the COVID-19 declaration under Section 564(b)(1) of the Act, 21 U.S.C.section 360bbb-3(b)(1), unless the authorization is terminated  or revoked sooner.       Influenza A by PCR NEGATIVE NEGATIVE Final   Influenza B by PCR NEGATIVE NEGATIVE Final    Comment: (NOTE) The Xpert Xpress SARS-CoV-2/FLU/RSV plus assay is intended as an aid in the diagnosis of influenza from Nasopharyngeal swab specimens and should not be used as a sole basis for treatment. Nasal washings and aspirates are unacceptable for Xpert Xpress SARS-CoV-2/FLU/RSV testing.  Fact Sheet for Patients: EntrepreneurPulse.com.au  Fact Sheet for Healthcare Providers: IncredibleEmployment.be  This test is not yet approved or cleared by the [place]  States FDA and has been authorized for detection and/or diagnosis of SARS-CoV-2 by FDA under an Emergency Use Authorization (EUA). This EUA will remain in effect (meaning this test can be used) for the duration of the COVID-19 declaration under Section 564(b)(1) of the Act, 21 U.S.C. section 360bbb-3(b)(1), unless the authorization is terminated or revoked.  Performed at [facility], [address], [place], [place] [address]   C Difficile Quick Screen w PCR reflex     Status: None   Collection Time: 01/27/21  8:47 PM   Specimen: STOOL  Result Value Ref Range Status   C Diff antigen NEGATIVE NEGATIVE Final   C Diff toxin NEGATIVE NEGATIVE Final   C Diff interpretation No C. difficile detected.  Final    Comment: Performed at [facility], [address], [place], [place] [address]  Culture, blood (Routine X 2) w Reflex to ID Panel     Status: None   Collection Time: 01/27/21 10:59 PM   Specimen: BLOOD  Result Value Ref Range Status   Specimen Description BLOOD LEFT ANTECUBITAL  Final   Special Requests   Final    BOTTLES DRAWN AEROBIC AND ANAEROBIC  Blood Culture adequate volume   Culture   Final    NO GROWTH 5 DAYS Performed at [facility], [address], [place], [place] [address]    Report Status 02/01/2021 FINAL  Final  Culture, blood (Routine X 2) w Reflex to ID Panel     Status: None   Collection Time: 01/27/21 10:59 PM   Specimen: BLOOD LEFT HAND  Result Value Ref Range Status   Specimen Description BLOOD LEFT HAND  Final   Special Requests   Final    BOTTLES DRAWN AEROBIC AND ANAEROBIC Blood Culture adequate volume   Culture   Final    NO GROWTH 5 DAYS Performed at [facility], [address], [place], [place] [address]    Report Status 02/01/2021 FINAL  Final  MRSA Next Gen by PCR, Nasal     Status: None   Collection Time: 01/31/21  7:04 PM   Specimen: Nasal Mucosa; Nasal Swab  Result Value Ref Range Status   MRSA by PCR Next Gen NOT DETECTED NOT DETECTED Final    Comment: (NOTE) The GeneXpert MRSA Assay (FDA approved for NASAL specimens only), is one component of a comprehensive MRSA colonization surveillance program. It is not intended to diagnose MRSA infection nor to guide or monitor treatment for MRSA infections. Test performance is not FDA approved in patients less than 63 years old. Performed at [facility], [address] [place], [place] [address]   Culture, blood (routine x 2)     Status: None (Preliminary result)   Collection Time: 02/02/21  2:34 PM   Specimen: BLOOD  Result Value Ref Range Status   Specimen Description BLOOD RIGHT ANTECUBITAL  Final   Special Requests   Final    BOTTLES DRAWN AEROBIC AND ANAEROBIC Blood Culture adequate volume   Culture   Final    NO GROWTH < 24 HOURS Performed at [facility], [address], [place], [place] [address]    Report Status PENDING  Incomplete  Culture, blood (routine x 2)     Status: None (Preliminary result)   Collection Time: 02/02/21  2:34 PM   Specimen: BLOOD RIGHT HAND  Result Value Ref Range Status   Specimen  Description BLOOD RIGHT HAND  Final  Special Requests   Final    BOTTLES DRAWN AEROBIC AND ANAEROBIC Blood Culture adequate volume   Culture   Final    NO GROWTH < 24 HOURS Performed at [facility], [address] [place], [place] [address]    Report Status PENDING  Incomplete     Marcos Eke, NP [facility]  02/04/2021  10:51 AM

## 2021-02-04 NOTE — Progress Notes (Signed)
OT Cancellation Note  Patient Details Name: Mark Garrett MRN: [id number] DOB: October 29, 1980   Cancelled Treatment:    Reason Eval/Treat Not Completed: Other (comment) Attempted OT session around 1330 with pt in process of transferring to another unit.  Lorre Munroe 02/04/2021, 1:58 PM

## 2021-02-04 NOTE — Progress Notes (Signed)
CARDIAC REHAB PHASE I   Offered to walk and complete preop education. Pt declines at this time. Materials left at bedside.   Reynold Bowen, RN BSN 02/04/2021 1:03 PM

## 2021-02-04 NOTE — TOC Progression Note (Addendum)
Transition of Care [facility]) - Progression Note    Patient Details  Name: [name] MRN: [id number] Date of Birth: Oct 10, 1980  Transition of Care [facility]) CM/SW Contact  Izzabelle Bouley, LCSWA Phone Number: 02/04/2021, 9:11 AM  Clinical Narrative:    HF CSW reached out to [employer] to see about screening Mr. Brookover for Medicaid due to him having no health insurance coverage. Awaiting a response. [employer] reported they will screen Mr. Bruschi for Medicaid.  CSW will continue to follow throughout discharge.   Expected Discharge Plan: Home/Self Care Barriers to Discharge: Continued Medical Work up  Expected Discharge Plan and Services Expected Discharge Plan: Home/Self Care In-house Referral: Clinical Social Work Discharge Planning Services: CM Consult, Indigent Health Clinic, MATCH Program, Medication Assistance, Follow-up appt scheduled   Living arrangements for the past 2 months: Single Family Home                   DME Agency: NA       HH Arranged: NA           Social Determinants of Health (SDOH) Interventions Food Insecurity Interventions: Other (Comment) (Patient has food stamps $250 a month) Financial Strain Interventions: Intervention Not Indicated, Artist (referral to [employer] to screen for Medicaid, patient agreeable) Housing Interventions: Intervention Not Indicated Transportation Interventions: Retail banker  Readmission Risk Interventions No flowsheet data found.  Virgilia Quigg, MSW, LCSWA [phone] Heart Failure Social Worker

## 2021-02-05 LAB — HEPARIN LEVEL (UNFRACTIONATED): Heparin Unfractionated: 0.39 IU/mL (ref 0.30–0.70)

## 2021-02-05 LAB — MAGNESIUM: Magnesium: 2.4 mg/dL (ref 1.7–2.4)

## 2021-02-05 LAB — CBC WITH DIFFERENTIAL/PLATELET
Abs Immature Granulocytes: 0.39 10*3/uL — ABNORMAL HIGH (ref 0.00–0.07)
Basophils Absolute: 0.1 10*3/uL (ref 0.0–0.1)
Basophils Relative: 0 %
Eosinophils Absolute: 0.3 10*3/uL (ref 0.0–0.5)
Eosinophils Relative: 2 %
HCT: 37.5 % — ABNORMAL LOW (ref 39.0–52.0)
Hemoglobin: 10.5 g/dL — ABNORMAL LOW (ref 13.0–17.0)
Immature Granulocytes: 3 %
Lymphocytes Relative: 14 %
Lymphs Abs: 2 10*3/uL (ref 0.7–4.0)
MCH: 20.2 pg — ABNORMAL LOW (ref 26.0–34.0)
MCHC: 28 g/dL — ABNORMAL LOW (ref 30.0–36.0)
MCV: 72 fL — ABNORMAL LOW (ref 80.0–100.0)
Monocytes Absolute: 1.9 10*3/uL — ABNORMAL HIGH (ref 0.1–1.0)
Monocytes Relative: 13 %
Neutro Abs: 9.3 10*3/uL — ABNORMAL HIGH (ref 1.7–7.7)
Neutrophils Relative %: 68 %
Platelets: 204 10*3/uL (ref 150–400)
RBC: 5.21 MIL/uL (ref 4.22–5.81)
RDW: 26.5 % — ABNORMAL HIGH (ref 11.5–15.5)
WBC: 14 10*3/uL — ABNORMAL HIGH (ref 4.0–10.5)
nRBC: 0.1 % (ref 0.0–0.2)

## 2021-02-05 LAB — CBC
HCT: 37.6 % — ABNORMAL LOW (ref 39.0–52.0)
Hemoglobin: 10.7 g/dL — ABNORMAL LOW (ref 13.0–17.0)
MCH: 20 pg — ABNORMAL LOW (ref 26.0–34.0)
MCHC: 28.5 g/dL — ABNORMAL LOW (ref 30.0–36.0)
MCV: 70.3 fL — ABNORMAL LOW (ref 80.0–100.0)
Platelets: 217 10*3/uL (ref 150–400)
RBC: 5.35 MIL/uL (ref 4.22–5.81)
RDW: 26.5 % — ABNORMAL HIGH (ref 11.5–15.5)
WBC: 16.3 10*3/uL — ABNORMAL HIGH (ref 4.0–10.5)
nRBC: 0.2 % (ref 0.0–0.2)

## 2021-02-05 LAB — BASIC METABOLIC PANEL
Anion gap: 8 (ref 5–15)
BUN: 17 mg/dL (ref 6–20)
CO2: 29 mmol/L (ref 22–32)
Calcium: 8.6 mg/dL — ABNORMAL LOW (ref 8.9–10.3)
Chloride: 95 mmol/L — ABNORMAL LOW (ref 98–111)
Creatinine, Ser: 1.19 mg/dL (ref 0.61–1.24)
GFR, Estimated: >60 mL/min (ref >60)
Glucose, Bld: 144 mg/dL — ABNORMAL HIGH (ref 70–99)
Potassium: 3.9 mmol/L (ref 3.5–5.1)
Sodium: 132 mmol/L — ABNORMAL LOW (ref 135–145)

## 2021-02-05 LAB — ANA W/REFLEX IF POSITIVE: Anti Nuclear Antibody (ANA): NEGATIVE

## 2021-02-05 LAB — COOXEMETRY PANEL
Carboxyhemoglobin: 1.9 % — ABNORMAL HIGH (ref 0.5–1.5)
Methemoglobin: 0.7 % (ref 0.0–1.5)
O2 Saturation: 52.2 %
Total hemoglobin: 10.8 g/dL — ABNORMAL LOW (ref 12.0–16.0)

## 2021-02-05 NOTE — Progress Notes (Signed)
Patient ID: Mark Garrett, male   DOB: [birth year], 40 y.o.   MRN: [id number]      Advanced Heart Failure Rounding Note  PCP-Cardiologist: None   Subjective:    Remains on milrinone 0.375 with co-ox 67 -> 52%.  CVP 2  No BMET yet today.   Remains in NSR on amiodarone gtt with rare PVCs.   Feels ok. Denies CP, SOB, orthopnea or PND.   TEE (11/16): EF 35-40%, there was mobile vegetation on both the anterior and posterior mitral leaflets.  The leaflets did not completely coapt (?destruction from endocarditis) with severe mitral regurgitation (ERO 0.56 cm^2).  There was extensive plaque in the proximal descending thoracic aorta with what appears to be mobile vegetation attached to it.   LHC/RHC:   Coronary Findings  Diagnostic Dominance: Right Left Anterior Descending  Mid LAD lesion is 95% stenosed.  Ramus Intermedius  Ramus-1 lesion is 50% stenosed.  Ramus-2 lesion is 80% stenosed.  Right Coronary Artery  Mid RCA lesion is 30% stenosed.  Graft To PLV  Origin lesion is 100% stenosed.  LIMA Graft To Dist LAD  Intervention  No interventions have been documented. Right Heart  Right Heart Pressures RHC Procedural Findings: Hemodynamics (mmHg) on milrinone 0.375 RA mean 18 RV 69/20 PA 76/25, mean 44 PCWP mean 22, v-waves to 37 mmHg LV 100/23 AO 100/60  Oxygen saturations: PA 58% AO 97%  Cardiac Output (Fick) 5.33  Cardiac Index (Fick) 2.67 PVR 4.1 WU  Cardiac Output (Thermo) 4.89 Cardiac Index (Thermo) 2.45  PVR 4.5 WU  PAPI: 2.8    Objective:   Weight Range: 71.1 kg Body mass index is 21.87 kg/m.   Vital Signs:   Temp:  [98 F (36.7 C)-98.7 F (37.1 C)] 98.7 F (37.1 C) (11/19 1400) Pulse Rate:  [74-82] 81 (11/19 1400) Resp:  [15-18] 16 (11/19 1400) BP: (97-129)/(74-91) 101/80 (11/19 1400) SpO2:  [94 %-100 %] 97 % (11/19 1400) Weight:  [70.3 kg-71.1 kg] 71.1 kg (11/19 0410) Last BM Date: 01/30/21  Weight change: Filed Weights   02/03/21 0500  02/04/21 1415 02/05/21 0410  Weight: 68.9 kg 70.3 kg 71.1 kg    Intake/Output:   Intake/Output Summary (Last 24 hours) at 02/05/2021 1409 Last data filed at 02/05/2021 1400 Gross per 24 hour  Intake 960 ml  Output 1670 ml  Net -710 ml       Physical Exam    General:  Lying flat in bed No resp difficulty HEENT: normal Neck: supple. no JVD. Carotids 2+ bilat; no bruits. No lymphadenopathy or thryomegaly appreciated. Cor: PMI nondisplaced. Regular rate & rhythm. 2/6 MR Lungs: clear Abdomen: soft, nontender, nondistended. No hepatosplenomegaly. No bruits or masses. Good bowel sounds. Extremities: no cyanosis, clubbing, rash, edema + PICC s/p left foot amputation  Neuro: alert & orientedx3, cranial nerves grossly intact. moves all 4 extremities w/o difficulty. Affect pleasant   Telemetry   NSR 70-80 Personally reviewed   Labs    CBC Recent Labs    02/05/21 0331 02/05/21 1211  WBC 16.3* 14.0*  NEUTROABS  --  9.3*  HGB 10.7* 10.5*  HCT 37.6* 37.5*  MCV 70.3* 72.0*  PLT 217 867    Basic Metabolic Panel Recent Labs    02/03/21 0405 02/04/21 0309 02/05/21 0331  NA 132* 130*  --   K 3.7 3.8  --   CL 91* 90*  --   CO2 32 30  --   GLUCOSE 88 95  --   BUN 16  19  --   CREATININE 1.27* 1.47*  --   CALCIUM 8.9 9.2  --   MG 2.1 2.1 2.4  PHOS 3.7  --   --     Liver Function Tests No results for input(s): AST, ALT, ALKPHOS, BILITOT, PROT, ALBUMIN in the last 72 hours.  No results for input(s): LIPASE, AMYLASE in the last 72 hours. Cardiac Enzymes No results for input(s): CKTOTAL, CKMB, CKMBINDEX, TROPONINI in the last 72 hours.  BNP: BNP (last 3 results) Recent Labs    01/27/21 1034  BNP 1,733.0*     ProBNP (last 3 results) No results for input(s): PROBNP in the last 8760 hours.   D-Dimer No results for input(s): DDIMER in the last 72 hours. Hemoglobin A1C No results for input(s): HGBA1C in the last 72 hours. Fasting Lipid Panel No results for  input(s): CHOL, HDL, LDLCALC, TRIG, CHOLHDL, LDLDIRECT in the last 72 hours.  Thyroid Function Tests No results for input(s): TSH, T4TOTAL, T3FREE, THYROIDAB in the last 72 hours.  Invalid input(s): FREET3  Other results:   Imaging    No results found.   Medications:     Scheduled Medications:  aspirin  81 mg Oral Daily   Chlorhexidine Gluconate Cloth  6 each Topical Daily   dextromethorphan-guaiFENesin  1 tablet Oral BID   digoxin  0.125 mg Oral Daily   feeding supplement  237 mL Oral TID BM   Living Better with Heart Failure Book   Does not apply Once   multivitamin with minerals  1 tablet Oral Daily   nicotine  21 mg Transdermal QHS   pantoprazole  40 mg Oral Daily   rosuvastatin  40 mg Oral Daily   sodium chloride flush  10-40 mL Intracatheter Q12H   sodium chloride flush  3 mL Intravenous Q12H   sodium chloride flush  3 mL Intravenous Q12H   spironolactone  12.5 mg Oral Daily   Thrombi-Pad  1 each Topical Once    Infusions:  sodium chloride     sodium chloride Stopped (01/31/21 2357)   amiodarone 30 mg/hr (02/05/21 0642)   heparin 1,200 Units/hr (02/05/21 0641)   magnesium sulfate bolus IVPB     milrinone 0.375 mcg/kg/min (02/05/21 1358)   norepinephrine (LEVOPHED) Adult infusion Stopped (02/02/21 1739)    PRN Medications: sodium chloride, Place/Maintain arterial line **AND** sodium chloride, acetaminophen, magnesium hydroxide, ondansetron (ZOFRAN) IV, sodium chloride flush, sodium chloride flush    Assessment/Plan   1. Acute systolic CHF: Ischemic cardiomyopathy, symptoms worsened by severe MR.  I reviewed the echo done today, showing EF 35-40%, severe hypokinesis inferior and inferolateral walls, moderate LV dilation with mild LVH, mild RV dilation with moderately decreased systolic function, restricted posterior mitral leaflet and calcified mitral valve with severe mitral regurgitation and at least mild mitral stenosis (mean gradient 8 mmHg), PASP 60. Per  report, echo at [facility] showed EF 55-60% with severe MR in 10/22.  It was a technically difficult study per report.  It is possible that he had a new ACS event prior to admission with reported drop in EF though HS-TnI with mild elevation and no trend suggests that it was not immediately prior to admission.  Low output noted with co-ox 39% initially, lactate elevated when he was initially admitted.  Currently on milrinone 0.375 with marginal co-ox 52%.  CVP  2 - Continue milrinone 0.375 in setting of severe MR.   - Continue digoxin 0.125 daily.  - Torsemide held yesterday with low CVP. CVP 2  today. Continue to hold  - Continue spironolactone 12.5 daily.  - Hold ARB/ARNI with soft BP.   2. CAD: S/p CABG 2006.  As above, with fall in EF and CHF exacerbation, possible ACS prior to admission.  However, mild HS-TnI elevation with no trend suggests that ACS was not immediately prior to admission.  Current elevation is likely demand ischemia from volume overload. LHC on 11/14 showed patent LIMA-LAD with SVG occluded at aorta (does not look new); there was complex 80% proximal ramus stenosis.  Initially assumed that the SVG was to the ramus, but was subsequently got the CABG operative report from [place] and it looks like it was an SVG-PLV.  There was minimal native RCA disease. LAD territory well-supplied by LIMA. No current s/s angina - Continue heparin gtt while off Xarelto.  - ASA 81 - Crestor 40 - Will need SVG-ramus with valve surgery.   3.  Mitral valve disease: Initial echo showed posterior MV leaflet restricted with severe MR, suspected primarily infarct-related MR given LV dilation and inferior/inferolateral severe hypokinesis.  However, TEE on 11/16 showed vegetation (not bulky but clearly present) on the posterior and anterior leaflets with poor leaflet coaptation, suggesting endocarditis may be the major cause of severe MR.  Reviewed with ID, suspect the mitral vegetations have been present for a  while (severe MR on 10/22 echo as well, does not appear to have active infection).  - Seen by Dr. Cyndia Bent, will need mitral valve replacement timing TBD - Will need decayed teeth removed prior, Dr. Benson Norway following. Timing of surgery TBD 4. 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  ?due to sedentary lifestyle + ?genetic predisposition.   - Was on Xarelto PTA, now heparin gtt.  Restart Xarelto prior to discharge.  5. Smoking: I strongly encouraged him to quit.  6. PNA: WBCs 21 => 13 => 9 => 13.4 => 16.3, afebrile.  Cultures NGTD.  RML PNA on CT chest.  - He completed course of abx initially for PNA.  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 He is able to walk on the stump.  - PT.  8. 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  There is also mobile vegetation that appears adherent to plaque in the proximal descending thoracic aorta.  CT head showed no evidence for embolic disease.  Possible source would be due to poor dentition. Blood cultures from 11/10 and 11/16 NGTD.  He was initially on abx for PNA.  We re-drew cultures on 11/16.  ESR is 1, CRP 1.7.  WBCs elevated but afebrile.  At this point, I am not sure that we are going to find a source by culture as he has been already partially treated and suspect that the endocarditis is old as he had severe MR also on echo in 10/22. ID following. Off abx.  - Repeat blood cultures drawn 11/18    Length of Stay: [place], MD  02/05/2021, 2:09 PM  Advanced Heart Failure Team Pager [phone] (M-F; 7a - 5p)  Please contact [facility] for night-coverage after hours (5p -7a ) and weekends on [email or web address]

## 2021-02-05 NOTE — Plan of Care (Signed)
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 -Restart Vancomycin and cefepime as the persistent leukocytosis is concerning for Cx negative endocarditis(now 4 days out from cath).

## 2021-02-05 NOTE — Progress Notes (Signed)
CARDIAC REHAB PHASE I   Went to offer to walk with pt. Pt just received lunch tray. Began preop education with pt. Pt with questions timing of surgery and if he will be able to go home before it. Informed pt that I was not aware of a plan yet, but hopefully there would be one soon. Encouraged OOB this weekend. Will f/u Monday.  [phone] Reynold Bowen, RN BSN 02/05/2021 1:48 PM

## 2021-02-05 NOTE — Progress Notes (Signed)
ANTICOAGULATION CONSULT NOTE  Pharmacy Consult IV heparin Indication: pre-existing DVT  Allergies  Allergen Reactions   Iodide Rash    Patient Measurements: Height: 5\' 11"  (180.3 cm) Weight: 71.1 kg (156 lb 12.8 oz) IBW/kg (Calculated) : 75.3  Heparin Dosing Weight: 72.6 kg  Vital Signs: Temp: 98.3 F (36.8 C) (11/19 0928) Temp Source: Oral (11/19 0928) BP: 115/91 (11/19 0928) Pulse Rate: 78 (11/19 0928)  Labs: Recent Labs    02/03/21 0405 02/04/21 0309 02/04/21 0805 02/05/21 0331  HGB 10.9*  --  10.7* 10.7*  HCT 37.6*  --  37.9* 37.6*  PLT 224  --  208 217  HEPARINUNFRC 0.47 0.48  --  0.39  CREATININE 1.27* 1.47*  --   --      Estimated Creatinine Clearance: 65.8 mL/min (A) (by C-G formula based on SCr of 1.47 mg/dL (H)).   Medications:  Medications Prior to Admission  Medication Sig Dispense Refill Last Dose   XARELTO 20 MG TABS tablet Take 20 mg by mouth every evening.   01/25/2021 at 1900   Infusions:   sodium chloride     sodium chloride Stopped (01/31/21 2357)   amiodarone 30 mg/hr (02/05/21 02/07/21)   heparin 1,200 Units/hr (02/05/21 0641)   magnesium sulfate bolus IVPB     milrinone 0.375 mcg/kg/min (02/04/21 0700)   norepinephrine (LEVOPHED) Adult infusion Stopped (02/02/21 1739)    Assessment: 40 y.o. male with medical history significant for asthma/COPD, recent DVT, tobacco abuse who presented with 3 days SOB and CP. CT showed no evidence of pulmonary embolism.. 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 Pharmacy consulted to start heparin infusion.   Heparin level of 0.39 is therapeutic on 1200 units/hr. No s/sx of bleeding or infusion issues noted. CBC stable. Patient being evaluated for [facility] after dental extractions.  Goal of Therapy:  Heparin level 0.3-0.7 units/ml Monitor platelets by anticoagulation protocol: Yes   Plan:  Continue heparin infusion at 1200 units/hr Continue to monitor daily CBC, heparin  level Monitor s/sx bleeding F/u TCTS plans  Thank you for allowing pharmacy to participate in this patient's care.  [facility], PharmD PGY1 Pharmacy Resident 02/05/2021 12:28 PM Check [email or web address] for unit specific pharmacy number

## 2021-02-05 NOTE — Progress Notes (Signed)
Pt had a 13 beat run of V-tach 1336.  Pt asymptomatic and vitals within normal range.  MD paged.

## 2021-02-06 LAB — CBC
HCT: 34.9 % — ABNORMAL LOW (ref 39.0–52.0)
Hemoglobin: 9.9 g/dL — ABNORMAL LOW (ref 13.0–17.0)
MCH: 20.2 pg — ABNORMAL LOW (ref 26.0–34.0)
MCHC: 28.4 g/dL — ABNORMAL LOW (ref 30.0–36.0)
MCV: 71.2 fL — ABNORMAL LOW (ref 80.0–100.0)
Platelets: 206 10*3/uL (ref 150–400)
RBC: 4.9 MIL/uL (ref 4.22–5.81)
RDW: 26.6 % — ABNORMAL HIGH (ref 11.5–15.5)
WBC: 14.7 10*3/uL — ABNORMAL HIGH (ref 4.0–10.5)
nRBC: 0 % (ref 0.0–0.2)

## 2021-02-06 LAB — MAGNESIUM: Magnesium: 2.2 mg/dL (ref 1.7–2.4)

## 2021-02-06 LAB — BASIC METABOLIC PANEL
Anion gap: 7 (ref 5–15)
BUN: 18 mg/dL (ref 6–20)
CO2: 28 mmol/L (ref 22–32)
Calcium: 8.7 mg/dL — ABNORMAL LOW (ref 8.9–10.3)
Chloride: 98 mmol/L (ref 98–111)
Creatinine, Ser: 1.07 mg/dL (ref 0.61–1.24)
GFR, Estimated: >60 mL/min (ref >60)
Glucose, Bld: 87 mg/dL (ref 70–99)
Potassium: 4.5 mmol/L (ref 3.5–5.1)
Sodium: 133 mmol/L — ABNORMAL LOW (ref 135–145)

## 2021-02-06 LAB — COOXEMETRY PANEL
Carboxyhemoglobin: 1.6 % — ABNORMAL HIGH (ref 0.5–1.5)
Methemoglobin: 0.7 % (ref 0.0–1.5)
O2 Saturation: 54.1 %
Total hemoglobin: 10.2 g/dL — ABNORMAL LOW (ref 12.0–16.0)

## 2021-02-06 LAB — HEPARIN LEVEL (UNFRACTIONATED)
Heparin Unfractionated: 0.23 IU/mL — ABNORMAL LOW (ref 0.30–0.70)
Heparin Unfractionated: 0.38 IU/mL (ref 0.30–0.70)

## 2021-02-06 MED ORDER — SODIUM CHLORIDE 0.9 % IV SOLN
2.0000 g | Freq: Three times a day (TID) | INTRAVENOUS | Status: DC
  Administered 2021-02-06 – 2021-02-14 (×25): 2 g via INTRAVENOUS
  Filled 2021-02-06 (×24): qty 2

## 2021-02-06 MED ORDER — VANCOMYCIN HCL 1500 MG/300ML IV SOLN
1500.0000 mg | Freq: Two times a day (BID) | INTRAVENOUS | Status: AC
  Administered 2021-02-06: 1500 mg via INTRAVENOUS
  Filled 2021-02-06: qty 300

## 2021-02-06 MED ORDER — TORSEMIDE 20 MG PO TABS
20.0000 mg | ORAL_TABLET | Freq: Every day | ORAL | Status: DC
  Administered 2021-02-06 – 2021-02-12 (×7): 20 mg via ORAL
  Filled 2021-02-06 (×8): qty 1

## 2021-02-06 MED ORDER — VANCOMYCIN HCL 1000 MG/200ML IV SOLN
1000.0000 mg | Freq: Two times a day (BID) | INTRAVENOUS | Status: DC
  Administered 2021-02-06 – 2021-02-10 (×8): 1000 mg via INTRAVENOUS
  Filled 2021-02-06 (×8): qty 200

## 2021-02-06 NOTE — Progress Notes (Signed)
Mobility Specialist: Progress Note   02/06/21 1700  Mobility  Activity Ambulated in hall  Level of Assistance Independent  Assistive Device None  Distance Ambulated (ft) 370 ft  Mobility Ambulated independently in hallway  Mobility Response Tolerated well  Mobility performed by Mobility specialist  $Mobility charge 1 Mobility   Pre-Mobility: 84 HR, 97% SpO2 Post-Mobility: 90 HR  Pt to BR and then agreeable to ambulation, BM successful. No c/o throughout ambulation. Pt back to bed with call bell at his side.   [facility] Neoma Uhrich Mobility Specialist Mobility Specialist Phone #1: [phone] Mobility Specialist Phone #2: [phone]

## 2021-02-06 NOTE — Progress Notes (Addendum)
[facility]  Date of Admission:  01/27/2021   Total days of inpatient antibiotics 11  Principal Problem:   Acute systolic CHF (congestive heart failure) (HCC) Active Problems:   CAP (community acquired pneumonia)   Pleural effusion on right   Leukocytosis   Microcytic anemia   NSTEMI (non-ST elevated myocardial infarction) (HCC)   DVT (deep venous thrombosis) (HCC)   Cigarette smoker   COPD (chronic obstructive pulmonary disease) (HCC)   Asthma   Severe mitral regurgitation   Serum total bilirubin elevated   Paroxysmal VT   Cardiogenic shock ([place])   Endocarditis of mitral valve   Long term current use of anticoagulant therapy   Encounter for preoperative dental examination   Caries   Retained tooth root   Chronic apical periodontitis   Accretions on teeth   Teeth missing   Excessive dental attrition   Chronic periodontitis   Protein-calorie malnutrition, severe      40 year old male with COPD/asthma, CAD status post CABG 2006, DVT presented for shortness of breath and chest pain.  Shortness of breath started about 3 days prior to arrival.  Chest x-ray showed patchy airspace opacity at the right lung.  CT showed right middle lobe and small bilateral lower lobe opacity concerning for pneumonia.  He was initially started on Zithromax + ceftriaxone which was transitioned to Augmentin.  Bilateral lower extremity and Doppler shows age indeterminant DVT.  TEE revealed mobile vegetation about anterior and posterior mitral valve leaflets with severe mitral regurg.  He reports losing over 100 pounds over the past several months due to lack of access to  nutrition assistance.  Assessment: #Native valve mitral valve vegetation #Possible emboli to the lungs -HIV (VL<20)and RPR negative -ANA reflex negative -Brucella IgG and IgM, legionella PCR an ASO negative -Blood Cx negative 11/9(obtained prior to starting abx), 11/16,11/18 -As he continues to  have leukocytosis would continue empiric IE coverage. Another reason for treating for culture negative IE is that pt does not volunteer much history, so if he had an exposure I'm not sure if that would be relayed to providers.   Recommendations: -If taken for MVR please send tissue for [place] PCR, bacterial, fungal and afb Cx -Will plan to treat with 6 weeks of IV antibiotics empirically for Cx negative IE(11/16-12/27). I would not recommend sending him home with a PICC line as concern that he would not be able to manage it. He reported that he is agreeable to finishing antibiotics at a facility. ID will sign off. Appt scheduled with ID provider.  -Engage heme-onc to look for possible hypercoagulability leading to thrombus. In the setting of reported weight loss, possible malignancy -Follow fungal and acid fast Cx -Follow-up IGRA -Follow up Bartonella Ab  Microbiology:   Antibiotics:  Azithromycin 11/10 Ceftriaxone 11/10 Augmentin 11/11-11/15 Cefepime 11/16-17, 20 Vancomycin 11/16-17, 20  SUBJECTIVE: Pt is resting in bed. He has no new complaints. No significant overnight events. Interval: Afebrile overnight with wbc 14.7k.   Review of Systems: Review of Systems  All other systems reviewed and are negative.   Scheduled Meds:  aspirin  81 mg Oral Daily   Chlorhexidine Gluconate Cloth  6 each Topical Daily   dextromethorphan-guaiFENesin  1 tablet Oral BID   digoxin  0.125 mg Oral Daily   feeding supplement  237 mL Oral TID BM   Living Better with Heart Failure Book   Does not apply Once   multivitamin with minerals  1 tablet Oral Daily   nicotine  21 mg Transdermal QHS   pantoprazole  40 mg Oral Daily   rosuvastatin  40 mg Oral Daily   sodium chloride flush  10-40 mL Intracatheter Q12H   sodium chloride flush  3 mL Intravenous Q12H   sodium chloride flush  3 mL Intravenous Q12H   spironolactone  12.5 mg Oral Daily   Thrombi-Pad  1 each Topical Once   torsemide  20 mg  Oral Daily   Continuous Infusions:  sodium chloride     sodium chloride Stopped (01/31/21 2357)   amiodarone 30 mg/hr (02/06/21 1011)   ceFEPime (MAXIPIME) IV 2 g (02/06/21 1354)   heparin 1,300 Units/hr (02/06/21 0849)   magnesium sulfate bolus IVPB     milrinone 0.375 mcg/kg/min (02/06/21 1349)   norepinephrine (LEVOPHED) Adult infusion Stopped (02/02/21 1739)   vancomycin     PRN Meds:.sodium chloride, Place/Maintain arterial line **AND** sodium chloride, acetaminophen, magnesium hydroxide, ondansetron (ZOFRAN) IV, sodium chloride flush, sodium chloride flush Allergies  Allergen Reactions   Iodide Rash    OBJECTIVE: Vitals:   02/06/21 0523 02/06/21 0758 02/06/21 0849 02/06/21 1233  BP: 110/65 (!) 132/92  108/71  Pulse: 77 78 76 76  Resp: 18 18  18   Temp: 98 F (36.7 C) 98.1 F (36.7 C)  98.5 F (36.9 C)  TempSrc: Oral Oral  Oral  SpO2: 97% 96%  98%  Weight: 71.1 kg     Height:       Body mass index is 21.86 kg/m.  Physical Exam Constitutional:      General: He is not in acute distress.    Appearance: He is normal weight. He is not toxic-appearing.  HENT:     Head: Normocephalic and atraumatic.     Right Ear: External ear normal.     Left Ear: External ear normal.     Nose: No congestion or rhinorrhea.     Mouth/Throat:     Mouth: Mucous membranes are moist.     Pharynx: Oropharynx is clear.  Eyes:     Extraocular Movements: Extraocular movements intact.     Conjunctiva/sclera: Conjunctivae normal.     Pupils: Pupils are equal, round, and reactive to light.  Cardiovascular:     Rate and Rhythm: Normal rate and regular rhythm.     Heart sounds: No murmur heard.   No friction rub. No gallop.  Pulmonary:     Effort: Pulmonary effort is normal.     Breath sounds: Normal breath sounds.  Abdominal:     General: Abdomen is flat. Bowel sounds are normal.     Palpations: Abdomen is soft.  Musculoskeletal:        General: No swelling. Normal range of motion.      Cervical back: Normal range of motion and neck supple.  Skin:    General: Skin is warm and dry.  Neurological:     General: No focal deficit present.     Mental Status: He is oriented to person, place, and time.  Psychiatric:        Mood and Affect: Mood normal.      Lab Results Lab Results  Component Value Date   WBC 14.7 (H) 02/06/2021   HGB 9.9 (L) 02/06/2021   HCT 34.9 (L) 02/06/2021   MCV 71.2 (L) 02/06/2021   PLT 206 02/06/2021    Lab Results  Component Value Date   CREATININE 1.07 02/06/2021   BUN 18 02/06/2021   NA 133 (L)  02/06/2021   K 4.5 02/06/2021   CL 98 02/06/2021   CO2 28 02/06/2021    Lab Results  Component Value Date   ALT 29 02/01/2021   AST 27 02/01/2021   ALKPHOS 88 02/01/2021   BILITOT 2.9 (H) 02/01/2021        Danelle Earthly, MD [facility] 02/06/2021, 4:06 PM

## 2021-02-06 NOTE — Progress Notes (Signed)
ANTICOAGULATION CONSULT NOTE  Pharmacy Consult IV heparin Indication: pre-existing DVT  Allergies  Allergen Reactions   Iodide Rash    Patient Measurements: Height: 5\' 11"  (180.3 cm) Weight: 71.1 kg (156 lb 12 oz) IBW/kg (Calculated) : 75.3  Heparin Dosing Weight: 72.6 kg  Vital Signs: Temp: 98 F (36.7 C) (11/20 0523) Temp Source: Oral (11/20 0523) BP: 110/65 (11/20 0523) Pulse Rate: 77 (11/20 0523)  Labs: Recent Labs    02/04/21 0309 02/04/21 0805 02/05/21 0331 02/05/21 1211 02/05/21 1500 02/06/21 0434  HGB  --    < > 10.7* 10.5*  --  9.9*  HCT  --    < > 37.6* 37.5*  --  34.9*  PLT  --    < > 217 204  --  206  HEPARINUNFRC 0.48  --  0.39  --   --  0.23*  CREATININE 1.47*  --   --   --  1.19 1.07   < > = values in this interval not displayed.     Estimated Creatinine Clearance: 90.4 mL/min (by C-G formula based on SCr of 1.07 mg/dL).   Medications:  Medications Prior to Admission  Medication Sig Dispense Refill Last Dose   XARELTO 20 MG TABS tablet Take 20 mg by mouth every evening.   01/25/2021 at 1900   Infusions:   sodium chloride     sodium chloride Stopped (01/31/21 2357)   amiodarone 30 mg/hr (02/05/21 02/07/21)   heparin 1,200 Units/hr (02/05/21 0641)   magnesium sulfate bolus IVPB     milrinone 0.375 mcg/kg/min (02/06/21 0105)   norepinephrine (LEVOPHED) Adult infusion Stopped (02/02/21 1739)    Assessment: 40 y.o. male with medical history significant for asthma/COPD, recent DVT, tobacco abuse who presented with 3 days SOB and CP. CT showed no evidence of pulmonary embolism.. 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 Pharmacy consulted to start heparin infusion.   Heparin level of 0.23 is subtherapeutic on 1200 units/hr and was previously therapeutic on the same dose. No s/sx of bleeding or infusion issues noted. Verified line with nursing and no issues were reported overnight. CBC stable. Patient being evaluated  for [facility] after dental extractions.  Goal of Therapy:  Heparin level 0.3-0.7 units/ml Monitor platelets by anticoagulation protocol: Yes   Plan:  Increase heparin infusion at 1300 units/hr Check Heparin level in 6 hours Continue to monitor daily CBC, heparin level Monitor s/sx bleeding F/u TCTS plans  Thank you for allowing pharmacy to participate in this patient's care.  [facility], PharmD PGY1 Pharmacy Resident 02/06/2021 7:39 AM Check [email or web address] for unit specific pharmacy number

## 2021-02-06 NOTE — Progress Notes (Signed)
ANTICOAGULATION CONSULT NOTE  Pharmacy Consult IV heparin Indication: pre-existing DVT  Allergies  Allergen Reactions   Iodide Rash    Patient Measurements: Height: 5\' 11"  (180.3 cm) Weight: 71.1 kg (156 lb 12 oz) IBW/kg (Calculated) : 75.3  Heparin Dosing Weight: 72.6 kg  Vital Signs: Temp: 98.5 F (36.9 C) (11/20 1233) Temp Source: Oral (11/20 1233) BP: 108/71 (11/20 1233) Pulse Rate: 76 (11/20 1233)  Labs: Recent Labs    02/04/21 0309 02/04/21 0805 02/05/21 0331 02/05/21 1211 02/05/21 1500 02/06/21 0434 02/06/21 1400  HGB  --    < > 10.7* 10.5*  --  9.9*  --   HCT  --    < > 37.6* 37.5*  --  34.9*  --   PLT  --    < > 217 204  --  206  --   HEPARINUNFRC 0.48  --  0.39  --   --  0.23* 0.38  CREATININE 1.47*  --   --   --  1.19 1.07  --    < > = values in this interval not displayed.     Estimated Creatinine Clearance: 90.4 mL/min (by C-G formula based on SCr of 1.07 mg/dL).   Medications:  Medications Prior to Admission  Medication Sig Dispense Refill Last Dose   XARELTO 20 MG TABS tablet Take 20 mg by mouth every evening.   01/25/2021 at 1900   Infusions:   sodium chloride     sodium chloride Stopped (01/31/21 2357)   amiodarone 30 mg/hr (02/06/21 1011)   ceFEPime (MAXIPIME) IV 2 g (02/06/21 1354)   heparin 1,300 Units/hr (02/06/21 0849)   magnesium sulfate bolus IVPB     milrinone 0.375 mcg/kg/min (02/06/21 1349)   norepinephrine (LEVOPHED) Adult infusion Stopped (02/02/21 1739)   vancomycin      Assessment: 40 y.o. male with medical history significant for asthma/COPD, recent DVT, tobacco abuse who presented with 3 days SOB and CP. CT showed no evidence of pulmonary embolism.. 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 Pharmacy consulted to start heparin infusion.   Heparin level is therapeutic tonight at 0.38, on 1300 units/hr. No s/sx of bleeding or infusion issues noted. Patient being evaluated for [facility] after  dental extractions.  Goal of Therapy:  Heparin level 0.3-0.7 units/ml Monitor platelets by anticoagulation protocol: Yes   Plan:  Continue heparin infusion at 1300 units/hr Continue to monitor daily CBC, heparin level Monitor s/sx bleeding F/u TCTS plans  Thank you for allowing pharmacy to participate in this patient's care.  [facility], PharmD, BCCCP Clinical Pharmacist  Phone: [phone] 02/06/2021 4:29 PM  Please check AMION for all [facility] Pharmacy phone numbers After 10:00 PM, call Main Pharmacy [phone]

## 2021-02-06 NOTE — Progress Notes (Signed)
Pharmacy Antibiotic Note  Mark Garrett is a 40 y.o. male admitted on 01/27/2021 with acute exacerbation of heart failure now with suspected culture negative endocarditis.  Pharmacy has been consulted to restart cefepime and vancomycin after persistent leukocytosis in the setting of possible culture negative endocarditis.  Patient received loading dose of vancomycin on 11/16 and one dose of 1000 mg on 11/17. This has likely cleared and will plan to load again. Renal function is improved, will continue to monitor carefully. WBC 14.7. Patient is afebrile.   Plan: Give vancomycin 1500 mg IV x1 Vancomycin 1000 mg IV every 12 hours.  Goal trough 15-20 mcg/mL.    > eAUC 465.6, SCr used 1.07, Cmax 30.8, Cmin 11.2 Cefepime 2 gm IV every 8 hours Obtain vancomycin levels as indicated  Temp (24hrs), Avg:98.3 F (36.8 C), Min:98 F (36.7 C), Max:98.7 F (37.1 C)  Recent Labs  Lab 01/30/21 1442 01/30/21 2301 01/31/21 0620 02/02/21 0500 02/03/21 0405 02/04/21 0309 02/04/21 0805 02/05/21 0331 02/05/21 1211 02/05/21 1500 02/06/21 0434  WBC  --   --    < > 12.1* 13.4*  --  13.3* 16.3* 14.0*  --  14.7*  CREATININE 1.18  --    < > 1.22 1.27* 1.47*  --   --   --  1.19 1.07  LATICACIDVEN 3.0* 2.4*  --   --   --   --   --   --   --   --   --    < > = values in this interval not displayed.     Estimated Creatinine Clearance: 90.4 mL/min (by C-G formula based on SCr of 1.07 mg/dL).    Allergies  Allergen Reactions   Iodide Rash    Antimicrobials this admission: Vancomycin 11/16 >>11/17, 11/20>> Cefepime 11/16 >>11/17, 11/20>>  Augmentin 11/11 > 11/15 (CAP treatment) Ceftriaxone, Azithromycin 11/10 x1   Dose adjustments this admission: none  Microbiology results: 11/18: NG x 1 day 11/16 BCx: NGTD 11/10 BCx: NGF 11/14 MRSA PCR: negative  Thank you for allowing pharmacy to participate in this patient's care.  Enos Fling, PharmD PGY1 Pharmacy Resident 02/06/2021 7:59 AM Check  [email or web address] for unit specific pharmacy number

## 2021-02-06 NOTE — Progress Notes (Signed)
Patient ID: Mark Garrett, male   DOB: 06/27/1980, 40 y.o.   MRN: [id number]      Advanced Heart Failure Rounding Note  PCP-Cardiologist: None   Subjective:    Remains on milrinone 0.375 with co-ox 54%. CVP 7-8   Remains in NSR on amiodarone gtt with rare PVCs.   Feels ok. Denies CP, SOB, orthopnea or PND. No fevers.  Not getting out of bed   TEE (11/16): EF 35-40%, there was mobile vegetation on both the anterior and posterior mitral leaflets.  The leaflets did not completely coapt (?destruction from endocarditis) with severe mitral regurgitation (ERO 0.56 cm^2).  There was extensive plaque in the proximal descending thoracic aorta with what appears to be mobile vegetation attached to it.   LHC/RHC:   Coronary Findings  Diagnostic Dominance: Right Left Anterior Descending  Mid LAD lesion is 95% stenosed.  Ramus Intermedius  Ramus-1 lesion is 50% stenosed.  Ramus-2 lesion is 80% stenosed.  Right Coronary Artery  Mid RCA lesion is 30% stenosed.  Graft To PLV  Origin lesion is 100% stenosed.  LIMA Graft To Dist LAD  Intervention  No interventions have been documented. Right Heart  Right Heart Pressures RHC Procedural Findings: Hemodynamics (mmHg) on milrinone 0.375 RA mean 18 RV 69/20 PA 76/25, mean 44 PCWP mean 22, v-waves to 37 mmHg LV 100/23 AO 100/60  Oxygen saturations: PA 58% AO 97%  Cardiac Output (Fick) 5.33  Cardiac Index (Fick) 2.67 PVR 4.1 WU  Cardiac Output (Thermo) 4.89 Cardiac Index (Thermo) 2.45  PVR 4.5 WU  PAPI: 2.8    Objective:   Weight Range: 71.1 kg Body mass index is 21.86 kg/m.   Vital Signs:   Temp:  [98 F (36.7 C)-98.7 F (37.1 C)] 98.1 F (36.7 C) (11/20 0758) Pulse Rate:  [73-81] 76 (11/20 0849) Resp:  [16-18] 18 (11/20 0758) BP: (90-132)/(65-92) 132/92 (11/20 0758) SpO2:  [95 %-98 %] 96 % (11/20 0758) Weight:  [71.1 kg] 71.1 kg (11/20 0523) Last BM Date: 02/05/21  Weight change: Filed Weights   02/04/21 1415  02/05/21 0410 02/06/21 0523  Weight: 70.3 kg 71.1 kg 71.1 kg    Intake/Output:   Intake/Output Summary (Last 24 hours) at 02/06/2021 1126 Last data filed at 02/06/2021 0524 Gross per 24 hour  Intake 240 ml  Output 1570 ml  Net -1330 ml       Physical Exam    General:  Lying flat in bed No resp difficulty HEENT: normal Neck: supple. JVP 7-8  Carotids 2+ bilat; no bruits. No lymphadenopathy or thryomegaly appreciated. Cor: PMI nondisplaced. Regular rate & rhythm. 2/6 MR Lungs: clear Abdomen: soft, nontender, nondistended. No hepatosplenomegaly. No bruits or masses. Good bowel sounds Extremities: no cyanosis, clubbing, rash, edema + PICC s/p left foot amputation  Neuro: alert & oriented x 3, cranial nerves grossly intact. moves all 4 extremities w/o difficulty. Affect pleasant   Telemetry   NSR 70-80 Personally reviewed   Labs    CBC Recent Labs    02/05/21 1211 02/06/21 0434  WBC 14.0* 14.7*  NEUTROABS 9.3*  --   HGB 10.5* 9.9*  HCT 37.5* 34.9*  MCV 72.0* 71.2*  PLT 204 206    Basic Metabolic Panel Recent Labs    02/05/21 0331 02/05/21 1500 02/06/21 0434  NA  --  132* 133*  K  --  3.9 4.5  CL  --  95* 98  CO2  --  29 28  GLUCOSE  --  144* 87    BUN  --  17 18  CREATININE  --  1.19 1.07  CALCIUM  --  8.6* 8.7*  MG 2.4  --  2.2    Liver Function Tests No results for input(s): AST, ALT, ALKPHOS, BILITOT, PROT, ALBUMIN in the last 72 hours.  No results for input(s): LIPASE, AMYLASE in the last 72 hours. Cardiac Enzymes No results for input(s): CKTOTAL, CKMB, CKMBINDEX, TROPONINI in the last 72 hours.  BNP: BNP (last 3 results) Recent Labs    01/27/21 1034  BNP 1,733.0*     ProBNP (last 3 results) No results for input(s): PROBNP in the last 8760 hours.   D-Dimer No results for input(s): DDIMER in the last 72 hours. Hemoglobin A1C No results for input(s): HGBA1C in the last 72 hours. Fasting Lipid Panel No results for input(s): CHOL,  HDL, LDLCALC, TRIG, CHOLHDL, LDLDIRECT in the last 72 hours.  Thyroid Function Tests No results for input(s): TSH, T4TOTAL, T3FREE, THYROIDAB in the last 72 hours.  Invalid input(s): FREET3  Other results:   Imaging    No results found.   Medications:     Scheduled Medications:  aspirin  81 mg Oral Daily   Chlorhexidine Gluconate Cloth  6 each Topical Daily   dextromethorphan-guaiFENesin  1 tablet Oral BID   digoxin  0.125 mg Oral Daily   feeding supplement  237 mL Oral TID BM   Living Better with Heart Failure Book   Does not apply Once   multivitamin with minerals  1 tablet Oral Daily   nicotine  21 mg Transdermal QHS   pantoprazole  40 mg Oral Daily   rosuvastatin  40 mg Oral Daily   sodium chloride flush  10-40 mL Intracatheter Q12H   sodium chloride flush  3 mL Intravenous Q12H   sodium chloride flush  3 mL Intravenous Q12H   spironolactone  12.5 mg Oral Daily   Thrombi-Pad  1 each Topical Once    Infusions:  sodium chloride     sodium chloride Stopped (01/31/21 2357)   amiodarone 30 mg/hr (02/06/21 1011)   ceFEPime (MAXIPIME) IV 2 g (02/06/21 0926)   heparin 1,300 Units/hr (02/06/21 0849)   magnesium sulfate bolus IVPB     milrinone 0.375 mcg/kg/min (02/06/21 0105)   norepinephrine (LEVOPHED) Adult infusion Stopped (02/02/21 1739)   vancomycin 1,500 mg (02/06/21 1015)   Followed by   vancomycin      PRN Medications: sodium chloride, Place/Maintain arterial line **AND** sodium chloride, acetaminophen, magnesium hydroxide, ondansetron (ZOFRAN) IV, sodium chloride flush, sodium chloride flush    Assessment/Plan   1. Acute systolic CHF: Ischemic cardiomyopathy, symptoms worsened by severe MR.  I reviewed the echo done today, showing EF 35-40%, severe hypokinesis inferior and inferolateral walls, moderate LV dilation with mild LVH, mild RV dilation with moderately decreased systolic function, restricted posterior mitral leaflet and calcified mitral valve  with severe mitral regurgitation and at least mild mitral stenosis (mean gradient 8 mmHg), PASP 60. Per report, echo at [facility] showed EF 55-60% with severe MR in 10/22.  It was a technically difficult study per report.  It is possible that he had a new ACS event prior to admission with reported drop in EF though HS-TnI with mild elevation and no trend suggests that it was not immediately prior to admission.  Low output noted with co-ox 39% initially, lactate elevated when he was initially admitted.  Currently on milrinone 0.375 with marginal co-ox 54%.  CVP  7-8 - Continue milrinone 0.375 in setting  of severe MR.   - Continue digoxin 0.125 daily.  - Torsemide held 11/18 with low CVP. CVP 7-8 today. Weight stable start torsemide 20 daily today - Continue spironolactone 12.5 daily.  - Hold ARB/ARNI with soft BP.   2. CAD: S/p CABG 2006.  As above, with fall in EF and CHF exacerbation, possible ACS prior to admission.  However, mild HS-TnI elevation with no trend suggests that ACS was not immediately prior to admission.  Current elevation is likely demand ischemia from volume overload. LHC on 11/14 showed patent LIMA-LAD with SVG occluded at aorta (does not look new); there was complex 80% proximal ramus stenosis.  Initially assumed that the SVG was to the ramus, but was subsequently got the CABG operative report from [place] and it looks like it was an SVG-PLV.  There was minimal native RCA disease. LAD territory well-supplied by LIMA. No current s/s angina - Continue heparin gtt while off Xarelto.  - ASA 81 - Crestor 40 - Will need SVG-ramus with valve surgery.   3.  Mitral valve disease: Initial echo showed posterior MV leaflet restricted with severe MR, suspected primarily infarct-related MR given LV dilation and inferior/inferolateral severe hypokinesis.  However, TEE on 11/16 showed vegetation (not bulky but clearly present) on the posterior and anterior leaflets with poor leaflet coaptation,  suggesting endocarditis may be the major cause of severe MR.  Reviewed with ID, suspect the mitral vegetations have been present for a while (severe MR on 10/22 echo as well, does not appear to have active infection).  - Seen by Dr. Bartle, will need mitral valve replacement timing TBD. Will f/u tomorrow - Will need decayed teeth removed prior, Dr. Owsley following. Timing of surgery TBD 4. 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  ?due to sedentary lifestyle + ?genetic predisposition.   - Was on Xarelto PTA, now heparin gtt.  Restart Xarelto prior to discharge.  5. Smoking: I strongly encouraged him to quit.  6. PNA: WBC 16.3 -> 14.0 -> 14.7  afebrile.  Cultures 11/18 NGTD.  RML PNA on CT chest.  - He completed course of abx initially for PNA.  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 He is able to walk on the stump.  - PT following 8. 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  There is also mobile vegetation that appears adherent to plaque in the proximal descending thoracic aorta.  CT head showed no evidence for embolic disease.  Possible source would be due to poor dentition. Blood cultures from 11/10 and 11/16 NGTD.  He was initially on abx for PNA.  We re-drew cultures on 11/16.  ESR is 1, CRP 1.7.  WBCs elevated but afebrile.  At this point, I am not sure that we are going to find a source by culture as he has been already partially treated and suspect that the endocarditis is old as he had severe MR also on echo in 10/22. ID following. Off abx.  - Repeat blood cultures drawn 11/18 -> NGTD 9. Microcytic anemia - check iron stores in am    Length of Stay: 10  Daniel Bensimhon, MD  02/06/2021, 11:26 AM  Advanced Heart Failure Team Pager [phone] (M-F; 7a - 5p)  Please contact [facility] for night-coverage after hours (5p -7a ) and weekends on [email or web address]   

## 2021-02-07 LAB — CULTURE, BLOOD (ROUTINE X 2)
Culture: NO GROWTH
Culture: NO GROWTH
Special Requests: ADEQUATE
Special Requests: ADEQUATE

## 2021-02-07 LAB — CBC
HCT: 34.8 % — ABNORMAL LOW (ref 39.0–52.0)
Hemoglobin: 10 g/dL — ABNORMAL LOW (ref 13.0–17.0)
MCH: 20.5 pg — ABNORMAL LOW (ref 26.0–34.0)
MCHC: 28.7 g/dL — ABNORMAL LOW (ref 30.0–36.0)
MCV: 71.3 fL — ABNORMAL LOW (ref 80.0–100.0)
Platelets: 189 10*3/uL (ref 150–400)
RBC: 4.88 MIL/uL (ref 4.22–5.81)
RDW: 27.6 % — ABNORMAL HIGH (ref 11.5–15.5)
WBC: 13.3 10*3/uL — ABNORMAL HIGH (ref 4.0–10.5)
nRBC: 0 % (ref 0.0–0.2)

## 2021-02-07 LAB — HEPARIN LEVEL (UNFRACTIONATED): Heparin Unfractionated: 0.3 IU/mL (ref 0.30–0.70)

## 2021-02-07 LAB — BASIC METABOLIC PANEL
Anion gap: 10 (ref 5–15)
BUN: 19 mg/dL (ref 6–20)
CO2: 27 mmol/L (ref 22–32)
Calcium: 8.7 mg/dL — ABNORMAL LOW (ref 8.9–10.3)
Chloride: 96 mmol/L — ABNORMAL LOW (ref 98–111)
Creatinine, Ser: 1.15 mg/dL (ref 0.61–1.24)
GFR, Estimated: >60 mL/min (ref >60)
Glucose, Bld: 94 mg/dL (ref 70–99)
Potassium: 3.9 mmol/L (ref 3.5–5.1)
Sodium: 133 mmol/L — ABNORMAL LOW (ref 135–145)

## 2021-02-07 LAB — BARTONELLA ANTIBODY PANEL
B Quintana IgM: NEGATIVE titer
B henselae IgG: NEGATIVE titer
B henselae IgM: NEGATIVE titer
B quintana IgG: NEGATIVE titer

## 2021-02-07 LAB — FERRITIN: Ferritin: 347 ng/mL — ABNORMAL HIGH (ref 24–336)

## 2021-02-07 LAB — COOXEMETRY PANEL
Carboxyhemoglobin: 2 % — ABNORMAL HIGH (ref 0.5–1.5)
Methemoglobin: 0.7 % (ref 0.0–1.5)
O2 Saturation: 55.9 %
Total hemoglobin: 10.1 g/dL — ABNORMAL LOW (ref 12.0–16.0)

## 2021-02-07 LAB — ANA W/REFLEX IF POSITIVE: Anti Nuclear Antibody (ANA): NEGATIVE

## 2021-02-07 LAB — Q FEVER ANTIBODIES, IGG
Q Fever Phase I: NEGATIVE
Q Fever Phase II: NEGATIVE

## 2021-02-07 LAB — IRON AND TIBC
Iron: 39 ug/dL — ABNORMAL LOW (ref 45–182)
Saturation Ratios: 9 % — ABNORMAL LOW (ref 17.9–39.5)
TIBC: 428 ug/dL (ref 250–450)
UIBC: 389 ug/dL

## 2021-02-07 LAB — MAGNESIUM: Magnesium: 2.1 mg/dL (ref 1.7–2.4)

## 2021-02-07 LAB — DIGOXIN LEVEL: Digoxin Level: 0.5 ng/mL — ABNORMAL LOW (ref 0.8–2.0)

## 2021-02-07 MED ORDER — SODIUM CHLORIDE 0.9 % IV SOLN
510.0000 mg | Freq: Once | INTRAVENOUS | Status: AC
  Administered 2021-02-07: 510 mg via INTRAVENOUS
  Filled 2021-02-07 (×2): qty 17

## 2021-02-07 MED ORDER — SPIRONOLACTONE 25 MG PO TABS
25.0000 mg | ORAL_TABLET | Freq: Every day | ORAL | Status: DC
  Administered 2021-02-07 – 2021-02-16 (×10): 25 mg via ORAL
  Filled 2021-02-07 (×11): qty 1

## 2021-02-07 NOTE — Progress Notes (Addendum)
Patient ID: Mark Garrett, male   DOB: 06-Oct-1980, 40 y.o.   MRN: [id number]      Advanced Heart Failure Rounding Note  PCP-Cardiologist: None   Subjective:    Remains on milrinone 0.375 with co-ox 56%. CVP not reading correctly. Wt stable.   Remains in NSR on amiodarone gtt  No complaints today. Sitting up in bed eating breakfast. Denies CP. No dyspnea.   TEE (11/16): EF 35-40%, there was mobile vegetation on both the anterior and posterior mitral leaflets.  The leaflets did not completely coapt (?destruction from endocarditis) with severe mitral regurgitation (ERO 0.56 cm^2).  There was extensive plaque in the proximal descending thoracic aorta with what appears to be mobile vegetation attached to it.   LHC/RHC:   Coronary Findings  Diagnostic Dominance: Right Left Anterior Descending  Mid LAD lesion is 95% stenosed.  Ramus Intermedius  Ramus-1 lesion is 50% stenosed.  Ramus-2 lesion is 80% stenosed.  Right Coronary Artery  Mid RCA lesion is 30% stenosed.  Graft To PLV  Origin lesion is 100% stenosed.  LIMA Graft To Dist LAD  Intervention  No interventions have been documented. Right Heart  Right Heart Pressures RHC Procedural Findings: Hemodynamics (mmHg) on milrinone 0.375 RA mean 18 RV 69/20 PA 76/25, mean 44 PCWP mean 22, v-waves to 37 mmHg LV 100/23 AO 100/60  Oxygen saturations: PA 58% AO 97%  Cardiac Output (Fick) 5.33  Cardiac Index (Fick) 2.67 PVR 4.1 WU  Cardiac Output (Thermo) 4.89 Cardiac Index (Thermo) 2.45  PVR 4.5 WU  PAPI: 2.8    Objective:   Weight Range: 70.9 kg Body mass index is 21.8 kg/m.   Vital Signs:   Temp:  [98.1 F (36.7 C)-98.8 F (37.1 C)] 98.7 F (37.1 C) (11/21 0407) Pulse Rate:  [76-83] 83 (11/21 0407) Resp:  [17-19] 19 (11/21 0407) BP: (108-144)/(71-96) 144/96 (11/21 0407) SpO2:  [96 %-98 %] 97 % (11/21 0407) Weight:  [70.9 kg] 70.9 kg (11/21 0407) Last BM Date: 02/05/21  Weight change: Filed Weights    02/05/21 0410 02/06/21 0523 02/07/21 0407  Weight: 71.1 kg 71.1 kg 70.9 kg    Intake/Output:   Intake/Output Summary (Last 24 hours) at 02/07/2021 0757 Last data filed at 02/07/2021 0300 Gross per 24 hour  Intake 480 ml  Output 4085 ml  Net -3605 ml      Physical Exam    General:  Well appearing, sitting up in bed. No respiratory difficulty HEENT: normal Neck: supple. JVD 9 cm. Carotids 2+ bilat; no bruits. No lymphadenopathy or thyromegaly appreciated. Cor: PMI nondisplaced. Regular rate & rhythm. 2/6 MR murmur  Lungs: clear Abdomen: soft, nontender, nondistended. No hepatosplenomegaly. No bruits or masses. Good bowel sounds. Extremities: no cyanosis, clubbing, rash, edema, LUE PICC, Left foot amputation  Neuro: alert & oriented x 3, cranial nerves grossly intact. moves all 4 extremities w/o difficulty. Affect pleasant.  Telemetry   NSR 70-80 Personally reviewed   Labs    CBC Recent Labs    02/05/21 1211 02/06/21 0434 02/07/21 0441  WBC 14.0* 14.7* 13.3*  NEUTROABS 9.3*  --   --   HGB 10.5* 9.9* 10.0*  HCT 37.5* 34.9* 34.8*  MCV 72.0* 71.2* 71.3*  PLT 204 206 629   Basic Metabolic Panel Recent Labs    02/06/21 0434 02/07/21 0441  NA 133* 133*  K 4.5 3.9  CL 98 96*  CO2 28 27  GLUCOSE 87 94  BUN 18 19  CREATININE 1.07 1.15  CALCIUM 8.7*  8.7*  MG 2.2 2.1   Liver Function Tests No results for input(s): AST, ALT, ALKPHOS, BILITOT, PROT, ALBUMIN in the last 72 hours.  No results for input(s): LIPASE, AMYLASE in the last 72 hours. Cardiac Enzymes No results for input(s): CKTOTAL, CKMB, CKMBINDEX, TROPONINI in the last 72 hours.  BNP: BNP (last 3 results) Recent Labs    01/27/21 1034  BNP 1,733.0*    ProBNP (last 3 results) No results for input(s): PROBNP in the last 8760 hours.   D-Dimer No results for input(s): DDIMER in the last 72 hours. Hemoglobin A1C No results for input(s): HGBA1C in the last 72 hours. Fasting Lipid Panel No  results for input(s): CHOL, HDL, LDLCALC, TRIG, CHOLHDL, LDLDIRECT in the last 72 hours.  Thyroid Function Tests No results for input(s): TSH, T4TOTAL, T3FREE, THYROIDAB in the last 72 hours.  Invalid input(s): FREET3  Other results:   Imaging    No results found.   Medications:     Scheduled Medications:  aspirin  81 mg Oral Daily   Chlorhexidine Gluconate Cloth  6 each Topical Daily   dextromethorphan-guaiFENesin  1 tablet Oral BID   digoxin  0.125 mg Oral Daily   feeding supplement  237 mL Oral TID BM   Living Better with Heart Failure Book   Does not apply Once   multivitamin with minerals  1 tablet Oral Daily   nicotine  21 mg Transdermal QHS   pantoprazole  40 mg Oral Daily   rosuvastatin  40 mg Oral Daily   sodium chloride flush  10-40 mL Intracatheter Q12H   sodium chloride flush  3 mL Intravenous Q12H   sodium chloride flush  3 mL Intravenous Q12H   spironolactone  12.5 mg Oral Daily   Thrombi-Pad  1 each Topical Once   torsemide  20 mg Oral Daily    Infusions:  sodium chloride     sodium chloride Stopped (01/31/21 2357)   amiodarone 30 mg/hr (02/06/21 2250)   ceFEPime (MAXIPIME) IV 2 g (02/07/21 0547)   heparin 1,300 Units/hr (02/06/21 0849)   magnesium sulfate bolus IVPB     milrinone 0.375 mcg/kg/min (02/06/21 1349)   norepinephrine (LEVOPHED) Adult infusion Stopped (02/02/21 1739)   vancomycin 1,000 mg (02/06/21 2154)    PRN Medications: sodium chloride, Place/Maintain arterial line **AND** sodium chloride, acetaminophen, magnesium hydroxide, ondansetron (ZOFRAN) IV, sodium chloride flush, sodium chloride flush    Assessment/Plan   1. Acute systolic CHF: Ischemic cardiomyopathy, symptoms worsened by severe MR.  I reviewed the echo done, showing EF 35-40%, severe hypokinesis inferior and inferolateral walls, moderate LV dilation with mild LVH, mild RV dilation with moderately decreased systolic function, restricted posterior mitral leaflet and  calcified mitral valve with severe mitral regurgitation and at least mild mitral stenosis (mean gradient 8 mmHg), PASP 60. Per report, echo at [facility] showed EF 55-60% with severe MR in 10/22.  It was a technically difficult study per report.  It is possible that he had a new ACS event prior to admission with reported drop in EF though HS-TnI with mild elevation and no trend suggests that it was not immediately prior to admission.  Low output noted with co-ox 39% initially, lactate elevated when he was initially admitted.  Currently on milrinone 0.375 with marginal co-ox 56%.  - Continue milrinone 0.375 in setting of severe MR.   - Continue digoxin 0.125 daily. Dig level ok (0.5) - Continue torsemide 20 mg daily  - Increase spironolactone to 25 mg daily.  -  Hold ARB/ARNI with soft BP.   2. CAD: S/p CABG 2006.  As above, with fall in EF and CHF exacerbation, possible ACS prior to admission.  However, mild HS-TnI elevation with no trend suggests that ACS was not immediately prior to admission.  Current elevation is likely demand ischemia from volume overload. LHC on 11/14 showed patent LIMA-LAD with SVG occluded at aorta (does not look new); there was complex 80% proximal ramus stenosis.  Initially assumed that the SVG was to the ramus, but was subsequently got the CABG operative report from [place] and it looks like it was an SVG-PLV.  There was minimal native RCA disease. LAD territory well-supplied by LIMA. No current s/s angina - Continue heparin gtt while off Xarelto.  - ASA 81 - Crestor 40 - Will need SVG-ramus with valve surgery.   3.  Mitral valve disease: Initial echo showed posterior MV leaflet restricted with severe MR, suspected primarily infarct-related MR given LV dilation and inferior/inferolateral severe hypokinesis.  However, TEE on 11/16 showed vegetation (not bulky but clearly present) on the posterior and anterior leaflets with poor leaflet coaptation, suggesting endocarditis may be  the major cause of severe MR.  Reviewed with ID, suspect the mitral vegetations have been present for a while (severe MR on 10/22 echo as well, does not appear to have active infection).  - Seen by Dr. Cyndia Bent, will need mitral valve replacement. Tentatively scheduled for 12/1  - Will need decayed teeth removed prior, Dr. Benson Norway following. Timing of surgery TBD 4. 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  ?due to sedentary lifestyle + ?genetic predisposition.   - Was on Xarelto PTA, now heparin gtt.  Restart Xarelto prior to discharge.  5. Smoking: I strongly encouraged him to quit.  6. PNA: WBC 16.3 -> 14.0 -> 14.7->13.3  afebrile.  Cultures 11/18 NGTD.  RML PNA on CT chest.  - He completed course of abx initially for PNA.  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 He is able to walk on the stump.  - PT following 8. 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  There is also mobile vegetation that appears adherent to plaque in the proximal descending thoracic aorta.  CT head showed no evidence for embolic disease.  Possible source would be due to poor dentition. Blood cultures from 11/10 and 11/16 NGTD.  He was initially on abx for PNA.  We re-drew cultures on 11/16.  ESR is 1, CRP 1.7.  WBCs elevated but afebrile.  At this point, I am not sure that we are going to find a source by culture as he has been already partially treated and suspect that the endocarditis is old as he had severe MR also on echo in 10/22. ID following.  - Repeat blood cultures drawn 11/18 -> NGTD - Per ID, continue 6 wks of IV abx to 12/27 => vancomycin/cefepime.  9. Microcytic anemia - Iron stores low, Give Feraheme    Length of Stay: [address], PA-C  02/07/2021, 7:57 AM  Advanced Heart Failure Team Pager [phone] (M-F; 7a - 5p)  Please contact [facility] for night-coverage after hours (5p -7a ) and weekends on [email or web address]   Patient seen  with PA, agree with the above note.   Co-ox 56%, CVP 5.  Creatinine stable at 1.15.  digoxin level 0.5.   General: NAD Neck: No JVD, no thyromegaly or thyroid nodule.  Lungs: Clear to auscultation bilaterally with normal  respiratory effort. CV: Nondisplaced PMI.  Heart regular S1/S2, no S3/S4, no murmur.  No peripheral edema.   Abdomen: Soft, nontender, no hepatosplenomegaly, no distention.  Skin: Intact without lesions or rashes.  Neurologic: Alert and oriented x 3.  Psych: Normal affect. Extremities: No clubbing or cyanosis.  HEENT: Normal.   Stable today, can continue torsemide 20 mg daily with milrinone 0.375.  Continue digoxin with good level and will increase spironolactone to 25 mg daily.  Tomorrow can add SGLT2 inhibitor.   Patient needs mitral valve replacement with SVG-ramus.  Have discussed with T[facility], apparently this cannot be done until next week (week from Thursday).  I am afraid we will have to keep him in the hospital, he will not be able to titrate off milrinone until after the valve is replaced.    ID has recommended continuing vancomycin/cefepime empirically for culture negative endocarditis until 12/27.  He will need to go to a SNF after discharge.   Loralie Champagne 02/07/2021 12:53 PM

## 2021-02-07 NOTE — Progress Notes (Signed)
Physical Therapy Treatment Patient Details Name: Mark Garrett MRN: [id number] DOB: 11-Mar-1981 Today's Date: 02/07/2021   History of Present Illness Pt is a 40 y.o. male admitted 01/27/21 due to CHF exacerbation. S/p R/L heart cath and coronary graft angiography on 11/14. 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 Will need MVR, tentatively scheduled for 12/1. PMH includes CHF, CAD (s/p CABG 2006), mitral valve disease, traumatic amputation of L foot.   PT Comments    Pt progressing with mobility. Today's session focused on mobility while maintaining sternal precautions in preparation for potential MVR; pt with good ability to maintain these, supervision for safety/lines with activity. Expect pt to progress well post-op. Will continue to follow acutely to address established goals.     Recommendations for follow up therapy are one component of a multi-disciplinary discharge planning process, led by the attending physician.  Recommendations may be updated based on patient status, additional functional criteria and insurance authorization.  Follow Up Recommendations  No PT follow up     Assistance Recommended at Discharge PRN  Equipment Recommendations  None recommended by PT    Recommendations for Other Services       Precautions / Restrictions Precautions Precautions: Fall;Other (comment) Precaution Comments: h/o L forefoot amputation (does not have prosthetic in room; ambulates with/without it at home); initiated educ on sternal precautions in preparation for likely MVR Restrictions Weight Bearing Restrictions: No     Mobility  Bed Mobility Overal bed mobility: Modified Independent                  Transfers Overall transfer level: Independent Equipment used: None Transfers: Sit to/from Stand                  Ambulation/Gait Ambulation/Gait assistance: Independent;Supervision Social research officer, government (Feet): 300  Feet Assistive device: None Gait Pattern/deviations: Step-through pattern;Decreased stride length Gait velocity: Decreased     General Gait Details: Slow, mostly steady gait without DME; supervision for safety lines; 2x self-corrected instability with turning   Stairs Stairs: Yes Stairs assistance: Supervision Stair Management: One rail Right;Alternating pattern;Forwards Number of Stairs: 3 General stair comments: ascend/descend 3 steps with rail support, supervision for safety lines; able to turn around on steps without rail support   Wheelchair Mobility    Modified Rankin (Stroke Patients Only)       Balance Overall balance assessment: Needs assistance Sitting-balance support: No upper extremity supported;Feet supported Sitting balance-Leahy Scale: Normal Sitting balance - Comments: indep to don socks sitting EOB   Standing balance support: No upper extremity supported;During functional activity Standing balance-Leahy Scale: Good                              Cognition Arousal/Alertness: Awake/alert Behavior During Therapy: WFL for tasks assessed/performed Overall Cognitive Status: Within Functional Limits for tasks assessed                                          Exercises      General Comments General comments (skin integrity, edema, etc.): initiated educ on sternal precautions in preparation for mitral valve replacement; pt mobilizing well while maintaining with majority of mobility tasks      Pertinent Vitals/Pain Pain Assessment: No/denies pain    Home Living  Prior Function            PT Goals (current goals can now be found in the care plan section) Acute Rehab PT Goals Patient Stated Goal: return home PT Goal Formulation: With patient Time For Goal Achievement: 02/21/21 Potential to Achieve Goals: Good Progress towards PT goals: Progressing toward goals    Frequency    Min  2X/week      PT Plan Frequency needs to be updated    Co-evaluation              AM-PAC PT "6 Clicks" Mobility   Outcome Measure  Help needed turning from your back to your side while in a flat bed without using bedrails?: None Help needed moving from lying on your back to sitting on the side of a flat bed without using bedrails?: None Help needed moving to and from a bed to a chair (including a wheelchair)?: None Help needed standing up from a chair using your arms (e.g., wheelchair or bedside chair)?: None Help needed to walk in hospital room?: A Little Help needed climbing 3-5 steps with a railing? : A Little 6 Click Score: 22    End of Session Equipment Utilized During Treatment: Gait belt Activity Tolerance: Patient tolerated treatment well Patient left: in chair;with call bell/phone within reach Nurse Communication: Mobility status PT Visit Diagnosis: Unsteadiness on feet (R26.81);Muscle weakness (generalized) (M62.81)     Time: 9518-8416 PT Time Calculation (min) (ACUTE ONLY): 20 min  Charges:  $Therapeutic Activity: 8-22 mins                     Ina Homes, PT, DPT Acute Rehabilitation Services  Pager [phone] Office [phone]  Malachy Chamber 02/07/2021, 10:43 AM

## 2021-02-07 NOTE — Progress Notes (Signed)
Mobility Specialist: Progress Note   02/07/21 1732  Mobility  Activity Ambulated in hall  Level of Assistance Independent  Assistive Device None  Distance Ambulated (ft) 370 ft  Mobility Ambulated independently in hallway  Mobility Response Tolerated well  Mobility performed by Mobility specialist  $Mobility charge 1 Mobility   Post-Mobility: 88 HR, 103/83 BP, 97% SpO2  Pt had no c/o during ambulation. Pt set up with his dinner after walk with call bell and phone at his side. RN present in the room.   [facility] Mark Garrett Mobility Specialist Mobility Specialist Phone #1: [phone] Mobility Specialist Phone #2: [phone]

## 2021-02-07 NOTE — Progress Notes (Signed)
ANTICOAGULATION CONSULT NOTE  Pharmacy Consult IV heparin Indication: pre-existing DVT  Allergies  Allergen Reactions   Iodide Rash    Patient Measurements: Height: 5\' 11"  (180.3 cm) Weight: 70.9 kg (156 lb 4.9 oz) IBW/kg (Calculated) : 75.3  Heparin Dosing Weight: 72.6 kg  Vital Signs: Temp: 97.8 F (36.6 C) (11/21 1218) Temp Source: Oral (11/21 1218) BP: 122/82 (11/21 1218) Pulse Rate: 78 (11/21 1218)  Labs: Recent Labs    02/05/21 1211 02/05/21 1500 02/06/21 0434 02/06/21 1400 02/07/21 0441  HGB 10.5*  --  9.9*  --  10.0*  HCT 37.5*  --  34.9*  --  34.8*  PLT 204  --  206  --  189  HEPARINUNFRC  --   --  0.23* 0.38 0.30  CREATININE  --  1.19 1.07  --  1.15     Estimated Creatinine Clearance: 83.9 mL/min (by C-G formula based on SCr of 1.15 mg/dL).   Medications:  Medications Prior to Admission  Medication Sig Dispense Refill Last Dose   XARELTO 20 MG TABS tablet Take 20 mg by mouth every evening.   01/25/2021 at 1900   Infusions:   sodium chloride     sodium chloride Stopped (01/31/21 2357)   amiodarone 30 mg/hr (02/07/21 1248)   ceFEPime (MAXIPIME) IV 2 g (02/07/21 0547)   ferumoxytol     heparin 1,300 Units/hr (02/06/21 0849)   magnesium sulfate bolus IVPB     milrinone 0.375 mcg/kg/min (02/07/21 1247)   norepinephrine (LEVOPHED) Adult infusion Stopped (02/02/21 1739)   vancomycin 1,000 mg (02/07/21 0900)    Assessment: 40 y.o. male with medical history significant for asthma/COPD, recent DVT, tobacco abuse who presented with 3 days SOB and CP. CT showed no evidence of pulmonary embolism.. 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 Pharmacy consulted to start heparin infusion.   Heparin level is therapeutic tonight at 0.3, on heparin drip 1300 units/hr. No s/sx of bleeding or infusion issues noted. Patient being evaluated for MVR after dental extractions.  Goal of Therapy:  Heparin level 0.3-0.7  units/ml Monitor platelets by anticoagulation protocol: Yes   Plan:  Continue heparin infusion at 1300 units/hr Continue to monitor daily CBC, heparin level Monitor s/sx bleeding F/u TCTS plans   45 Pharm.D. CPP, BCPS Clinical Pharmacist [phone] 02/07/2021 2:16 PM    Please check AMION for all [facility] phone numbers After 10:00 PM, call Main Pharmacy [phone]

## 2021-02-07 NOTE — Progress Notes (Signed)
CARDIAC REHAB PHASE I   Went to offer to walk with pt. Pt up ambulating with PT. Will f/u later.  Reynold Bowen, RN BSN 02/07/2021 9:08 AM

## 2021-02-08 ENCOUNTER — Encounter (HOSPITAL_COMMUNITY): Payer: Self-pay | Admitting: Internal Medicine

## 2021-02-08 ENCOUNTER — Inpatient Hospital Stay (HOSPITAL_COMMUNITY): Payer: Self-pay | Admitting: Certified Registered Nurse Anesthetist

## 2021-02-08 ENCOUNTER — Encounter (HOSPITAL_COMMUNITY)
Admission: EM | Disposition: A | Discharge status: Home Health | Payer: Self-pay | Source: Home / Self Care | Attending: Cardiology

## 2021-02-08 HISTORY — PX: MULTIPLE EXTRACTIONS WITH ALVEOLOPLASTY: SHX5342

## 2021-02-08 LAB — BASIC METABOLIC PANEL
Anion gap: 10 (ref 5–15)
BUN: 20 mg/dL (ref 6–20)
CO2: 28 mmol/L (ref 22–32)
Calcium: 9.3 mg/dL (ref 8.9–10.3)
Chloride: 95 mmol/L — ABNORMAL LOW (ref 98–111)
Creatinine, Ser: 1.34 mg/dL — ABNORMAL HIGH (ref 0.61–1.24)
GFR, Estimated: >60 mL/min (ref >60)
Glucose, Bld: 97 mg/dL (ref 70–99)
Potassium: 3.6 mmol/L (ref 3.5–5.1)
Sodium: 133 mmol/L — ABNORMAL LOW (ref 135–145)

## 2021-02-08 LAB — MAGNESIUM: Magnesium: 2.2 mg/dL (ref 1.7–2.4)

## 2021-02-08 LAB — ACID FAST SMEAR (AFB, MYCOBACTERIA)
Source (AFB): 2
Source (AFB): 2

## 2021-02-08 LAB — COOXEMETRY PANEL
Carboxyhemoglobin: 2 % — ABNORMAL HIGH (ref 0.5–1.5)
Methemoglobin: 1.1 % (ref 0.0–1.5)
O2 Saturation: 67.1 %
Total hemoglobin: 10.8 g/dL — ABNORMAL LOW (ref 12.0–16.0)

## 2021-02-08 LAB — CBC
HCT: 36.9 % — ABNORMAL LOW (ref 39.0–52.0)
Hemoglobin: 10.7 g/dL — ABNORMAL LOW (ref 13.0–17.0)
MCH: 20.8 pg — ABNORMAL LOW (ref 26.0–34.0)
MCHC: 29 g/dL — ABNORMAL LOW (ref 30.0–36.0)
MCV: 71.7 fL — ABNORMAL LOW (ref 80.0–100.0)
Platelets: 180 10*3/uL (ref 150–400)
RBC: 5.15 MIL/uL (ref 4.22–5.81)
RDW: 28.1 % — ABNORMAL HIGH (ref 11.5–15.5)
WBC: 14.9 10*3/uL — ABNORMAL HIGH (ref 4.0–10.5)
nRBC: 0 % (ref 0.0–0.2)

## 2021-02-08 LAB — HEPARIN LEVEL (UNFRACTIONATED): Heparin Unfractionated: 0.39 IU/mL (ref 0.30–0.70)

## 2021-02-08 SURGERY — MULTIPLE EXTRACTION WITH ALVEOLOPLASTY
Anesthesia: General

## 2021-02-08 MED ORDER — CHLORHEXIDINE GLUCONATE 0.12 % MT SOLN
15.0000 mL | Freq: Once | OROMUCOSAL | Status: AC
  Administered 2021-02-08: 15 mL via OROMUCOSAL
  Filled 2021-02-08: qty 15

## 2021-02-08 MED ORDER — SUGAMMADEX SODIUM 200 MG/2ML IV SOLN
INTRAVENOUS | Status: DC | PRN
  Administered 2021-02-08: 200 mg via INTRAVENOUS

## 2021-02-08 MED ORDER — OXYMETAZOLINE HCL 0.05 % NA SOLN
NASAL | Status: DC | PRN
  Administered 2021-02-08: 2 via NASAL

## 2021-02-08 MED ORDER — ROCURONIUM BROMIDE 10 MG/ML (PF) SYRINGE
PREFILLED_SYRINGE | INTRAVENOUS | Status: AC
  Filled 2021-02-08: qty 10

## 2021-02-08 MED ORDER — DEXAMETHASONE SODIUM PHOSPHATE 10 MG/ML IJ SOLN
INTRAMUSCULAR | Status: DC | PRN
  Administered 2021-02-08: 10 mg via INTRAVENOUS

## 2021-02-08 MED ORDER — LIDOCAINE-EPINEPHRINE 2 %-1:100000 IJ SOLN
INTRAMUSCULAR | Status: AC
  Filled 2021-02-08: qty 10.2

## 2021-02-08 MED ORDER — ROCURONIUM BROMIDE 10 MG/ML (PF) SYRINGE
PREFILLED_SYRINGE | INTRAVENOUS | Status: DC | PRN
  Administered 2021-02-08: 60 mg via INTRAVENOUS

## 2021-02-08 MED ORDER — LIDOCAINE 2% (20 MG/ML) 5 ML SYRINGE
INTRAMUSCULAR | Status: DC | PRN
  Administered 2021-02-08: 40 mg via INTRAVENOUS

## 2021-02-08 MED ORDER — ONDANSETRON HCL 4 MG/2ML IJ SOLN
INTRAMUSCULAR | Status: AC
  Filled 2021-02-08: qty 2

## 2021-02-08 MED ORDER — OXYMETAZOLINE HCL 0.05 % NA SOLN
NASAL | Status: DC | PRN
  Administered 2021-02-08: 1

## 2021-02-08 MED ORDER — BUPIVACAINE-EPINEPHRINE (PF) 0.5% -1:200000 IJ SOLN
INTRAMUSCULAR | Status: AC
  Filled 2021-02-08: qty 3.6

## 2021-02-08 MED ORDER — FENTANYL CITRATE (PF) 250 MCG/5ML IJ SOLN
INTRAMUSCULAR | Status: DC | PRN
  Administered 2021-02-08: 50 ug via INTRAVENOUS

## 2021-02-08 MED ORDER — FENTANYL CITRATE (PF) 250 MCG/5ML IJ SOLN
INTRAMUSCULAR | Status: AC
  Filled 2021-02-08: qty 5

## 2021-02-08 MED ORDER — POTASSIUM CHLORIDE 20 MEQ PO PACK
20.0000 meq | PACK | Freq: Once | ORAL | Status: AC
  Administered 2021-02-08: 20 meq via ORAL
  Filled 2021-02-08: qty 1

## 2021-02-08 MED ORDER — HEMOSTATIC AGENTS (NO CHARGE) OPTIME
TOPICAL | Status: DC | PRN
  Administered 2021-02-08: 1 via TOPICAL

## 2021-02-08 MED ORDER — LACTATED RINGERS IV SOLN: INTRAVENOUS | Status: DC

## 2021-02-08 MED ORDER — AMIODARONE HCL 200 MG PO TABS
200.0000 mg | ORAL_TABLET | Freq: Two times a day (BID) | ORAL | Status: DC
  Administered 2021-02-08 – 2021-02-16 (×18): 200 mg via ORAL
  Filled 2021-02-08 (×18): qty 1

## 2021-02-08 MED ORDER — ORAL CARE MOUTH RINSE: 15.0000 mL | Freq: Once | OROMUCOSAL | Status: AC

## 2021-02-08 MED ORDER — PROPOFOL 10 MG/ML IV BOLUS
INTRAVENOUS | Status: AC
  Filled 2021-02-08: qty 20

## 2021-02-08 MED ORDER — ONDANSETRON HCL 4 MG/2ML IJ SOLN: 4.0000 mg | Freq: Once | INTRAMUSCULAR | Status: DC | PRN

## 2021-02-08 MED ORDER — FENTANYL CITRATE (PF) 100 MCG/2ML IJ SOLN: 25.0000 ug | INTRAMUSCULAR | Status: DC | PRN

## 2021-02-08 MED ORDER — LIDOCAINE-EPINEPHRINE 2 %-1:100000 IJ SOLN
INTRAMUSCULAR | Status: DC | PRN
  Administered 2021-02-08: 4.8 mL via INTRADERMAL

## 2021-02-08 MED ORDER — LIDOCAINE 2% (20 MG/ML) 5 ML SYRINGE
INTRAMUSCULAR | Status: AC
  Filled 2021-02-08: qty 5

## 2021-02-08 MED ORDER — MIDAZOLAM HCL 2 MG/2ML IJ SOLN
INTRAMUSCULAR | Status: AC
  Filled 2021-02-08: qty 2

## 2021-02-08 MED ORDER — MIDAZOLAM HCL 5 MG/5ML IJ SOLN
INTRAMUSCULAR | Status: DC | PRN
  Administered 2021-02-08 (×2): 1 mg via INTRAVENOUS

## 2021-02-08 MED ORDER — PROPOFOL 10 MG/ML IV BOLUS
INTRAVENOUS | Status: DC | PRN
  Administered 2021-02-08: 30 mg via INTRAVENOUS

## 2021-02-08 MED ORDER — ETOMIDATE 2 MG/ML IV SOLN
INTRAVENOUS | Status: DC | PRN
  Administered 2021-02-08: 12 mg via INTRAVENOUS

## 2021-02-08 MED ORDER — ONDANSETRON HCL 4 MG/2ML IJ SOLN
INTRAMUSCULAR | Status: DC | PRN
  Administered 2021-02-08: 4 mg via INTRAVENOUS

## 2021-02-08 MED ORDER — PHENYLEPHRINE 40 MCG/ML (10ML) SYRINGE FOR IV PUSH (FOR BLOOD PRESSURE SUPPORT)
PREFILLED_SYRINGE | INTRAVENOUS | Status: DC | PRN
  Administered 2021-02-08: 120 ug via INTRAVENOUS

## 2021-02-08 MED ORDER — 0.9 % SODIUM CHLORIDE (POUR BTL) OPTIME
TOPICAL | Status: DC | PRN
  Administered 2021-02-08: 1000 mL

## 2021-02-08 MED ORDER — DEXAMETHASONE SODIUM PHOSPHATE 10 MG/ML IJ SOLN
INTRAMUSCULAR | Status: AC
  Filled 2021-02-08: qty 1

## 2021-02-08 MED ORDER — DAPAGLIFLOZIN PROPANEDIOL 10 MG PO TABS
10.0000 mg | ORAL_TABLET | Freq: Every day | ORAL | Status: DC
  Administered 2021-02-08 – 2021-02-16 (×9): 10 mg via ORAL
  Filled 2021-02-08 (×10): qty 1

## 2021-02-08 MED ORDER — HEPARIN (PORCINE) 25000 UT/250ML-% IV SOLN
1800.0000 [IU]/h | INTRAVENOUS | Status: DC
  Administered 2021-02-09: 1300 [IU]/h via INTRAVENOUS
  Administered 2021-02-09 – 2021-02-14 (×7): 1450 [IU]/h via INTRAVENOUS
  Administered 2021-02-15: 1800 [IU]/h via INTRAVENOUS
  Administered 2021-02-15: 1600 [IU]/h via INTRAVENOUS
  Administered 2021-02-16 – 2021-02-17 (×2): 1800 [IU]/h via INTRAVENOUS
  Filled 2021-02-08 (×13): qty 250

## 2021-02-08 SURGICAL SUPPLY — 31 items
ALCOHOL 70% 16 OZ (MISCELLANEOUS) ×2 IMPLANT
BAG COUNTER SPONGE SURGICOUNT (BAG) ×2 IMPLANT
BLADE SURG 15 STRL LF DISP TIS (BLADE) ×1 IMPLANT
BLADE SURG 15 STRL SS (BLADE) ×1
COVER SURGICAL LIGHT HANDLE (MISCELLANEOUS) ×2 IMPLANT
GAUZE 4X4 16PLY ~~LOC~~+RFID DBL (SPONGE) ×2 IMPLANT
GAUZE PACKING FOLDED 2  STR (GAUZE/BANDAGES/DRESSINGS) ×2
GAUZE PACKING FOLDED 2 STR (GAUZE/BANDAGES/DRESSINGS) ×1 IMPLANT
GLOVE SURG ENC MOIS LTX SZ6.5 (GLOVE) ×2 IMPLANT
GLOVE SURG POLYISO LF SZ6 (GLOVE) ×2 IMPLANT
GOWN STRL REUS W/ TWL LRG LVL3 (GOWN DISPOSABLE) ×2 IMPLANT
GOWN STRL REUS W/TWL LRG LVL3 (GOWN DISPOSABLE) ×4
KIT BASIN OR (CUSTOM PROCEDURE TRAY) ×2 IMPLANT
KIT TURNOVER KIT B (KITS) ×2 IMPLANT
MANIFOLD NEPTUNE II (INSTRUMENTS) ×2 IMPLANT
NDL BLUNT 16X1.5 OR ONLY (NEEDLE) ×1 IMPLANT
NDL DENTAL 27 LONG (NEEDLE) ×2 IMPLANT
NEEDLE BLUNT 16X1.5 OR ONLY (NEEDLE) ×2 IMPLANT
NEEDLE DENTAL 27 LONG (NEEDLE) ×4 IMPLANT
NS IRRIG 1000ML POUR BTL (IV SOLUTION) ×2 IMPLANT
PACK EENT II TURBAN DRAPE (CUSTOM PROCEDURE TRAY) ×2 IMPLANT
PAD ARMBOARD 7.5X6 YLW CONV (MISCELLANEOUS) ×2 IMPLANT
SPONGE SURGIFOAM ABS GEL SZ50 (HEMOSTASIS) ×1 IMPLANT
SUT CHROMIC 3 0 PS 2 (SUTURE) ×3 IMPLANT
SYR 50ML SLIP (SYRINGE) ×2 IMPLANT
SYR BULB IRRIG 60ML STRL (SYRINGE) ×2 IMPLANT
TOWEL GREEN STERILE FF (TOWEL DISPOSABLE) ×2 IMPLANT
TUBE CONNECTING 12X1/4 (SUCTIONS) ×2 IMPLANT
WATER STERILE IRR 1000ML POUR (IV SOLUTION) ×2 IMPLANT
WATER TABLETS ICX (MISCELLANEOUS) ×2 IMPLANT
YANKAUER SUCT BULB TIP NO VENT (SUCTIONS) ×2 IMPLANT

## 2021-02-08 NOTE — Transfer of Care (Signed)
Immediate Anesthesia Transfer of Care Note  Patient: Mark Garrett [facility]  Procedure(s) Performed: MULTIPLE EXTRACTION WITH ALVEOLOPLASTY  Patient Location: PACU  Anesthesia Type:General  Level of Consciousness: oriented, drowsy and patient cooperative  Airway & Oxygen Therapy: Patient Spontanous Breathing and Patient connected to face mask oxygen  Post-op Assessment: Report given to RN and Post -op Vital signs reviewed and stable  Post vital signs: Reviewed  Last Vitals:  Vitals Value Taken Time  BP 98/82 02/08/21 1551  Temp 36.7 C 02/08/21 1551  Pulse 88 02/08/21 1604  Resp 16 02/08/21 1604  SpO2 90 % 02/08/21 1604  Vitals shown include unvalidated device data.  Last Pain:  Vitals:   02/08/21 1256  TempSrc: Oral  PainSc: 0-No pain      Patients Stated Pain Goal: 0 (02/08/21 1256)  Complications: No notable events documented.

## 2021-02-08 NOTE — Progress Notes (Signed)
Patient ID: Mark Garrett, male   DOB: 04-Apr-1980, 40 y.o.   MRN: [id number]      Advanced Heart Failure Rounding Note  PCP-Cardiologist: None   Subjective:    Remains on milrinone 0.375 with co-ox 67%. CVP 6.   Remains in NSR on amiodarone gtt.   No complaints today. Sitting up in bed eating breakfast. Denies CP. No dyspnea.   TEE (11/16): EF 35-40%, there was mobile vegetation on both the anterior and posterior mitral leaflets.  The leaflets did not completely coapt (?destruction from endocarditis) with severe mitral regurgitation (ERO 0.56 cm^2).  There was extensive plaque in the proximal descending thoracic aorta with what appears to be mobile vegetation attached to it.   LHC/RHC:   Coronary Findings  Diagnostic Dominance: Right Left Anterior Descending  Mid LAD lesion is 95% stenosed.  Ramus Intermedius  Ramus-1 lesion is 50% stenosed.  Ramus-2 lesion is 80% stenosed.  Right Coronary Artery  Mid RCA lesion is 30% stenosed.  Graft To PLV  Origin lesion is 100% stenosed.  LIMA Graft To Dist LAD  Intervention  No interventions have been documented. Right Heart  Right Heart Pressures RHC Procedural Findings: Hemodynamics (mmHg) on milrinone 0.375 RA mean 18 RV 69/20 PA 76/25, mean 44 PCWP mean 22, v-waves to 37 mmHg LV 100/23 AO 100/60  Oxygen saturations: PA 58% AO 97%  Cardiac Output (Fick) 5.33  Cardiac Index (Fick) 2.67 PVR 4.1 WU  Cardiac Output (Thermo) 4.89 Cardiac Index (Thermo) 2.45  PVR 4.5 WU  PAPI: 2.8    Objective:   Weight Range: 75.6 kg Body mass index is 23.25 kg/m.   Vital Signs:   Temp:  [97.8 F (36.6 C)-98.8 F (37.1 C)] 98.4 F (36.9 C) (11/22 0808) Pulse Rate:  [78-85] 82 (11/22 0850) Resp:  [16-18] 16 (11/22 0808) BP: (99-133)/(75-94) 133/94 (11/22 0808) SpO2:  [90 %-98 %] 98 % (11/22 0808) Weight:  [75.6 kg] 75.6 kg (11/22 0352) Last BM Date: 02/06/21  Weight change: Filed Weights   02/06/21 0523 02/07/21 0407  02/08/21 0352  Weight: 71.1 kg 70.9 kg 75.6 kg    Intake/Output:   Intake/Output Summary (Last 24 hours) at 02/08/2021 0902 Last data filed at 02/08/2021 0358 Gross per 24 hour  Intake 2708.1 ml  Output 5600 ml  Net -2891.9 ml      Physical Exam    General: NAD Neck: JVP 7-8 cm, no thyromegaly or thyroid nodule.  Lungs: Clear to auscultation bilaterally with normal respiratory effort. CV: Nondisplaced PMI.  Heart regular S1/S2, no S3/S4, 2/6 HSM apex.  No peripheral edema.   Abdomen: Soft, nontender, no hepatosplenomegaly, no distention.  Skin: Intact without lesions or rashes.  Neurologic: Alert and oriented x 3.  Psych: Normal affect. Extremities: No clubbing or cyanosis.  HEENT: Normal.    Telemetry   NSR 70-80 Personally reviewed   Labs    CBC Recent Labs    02/05/21 1211 02/06/21 0434 02/07/21 0441 02/08/21 0616  WBC 14.0*   < > 13.3* 14.9*  NEUTROABS 9.3*  --   --   --   HGB 10.5*   < > 10.0* 10.7*  HCT 37.5*   < > 34.8* 36.9*  MCV 72.0*   < > 71.3* 71.7*  PLT 204   < > 189 180   < > = values in this interval not displayed.   Basic Metabolic Panel Recent Labs    02/07/21 0441 02/08/21 0616  NA 133* 133*  K 3.9 3.6  CL 96* 95*  CO2 27 28  GLUCOSE 94 97  BUN 19 20  CREATININE 1.15 1.34*  CALCIUM 8.7* 9.3  MG 2.1 2.2   Liver Function Tests No results for input(s): AST, ALT, ALKPHOS, BILITOT, PROT, ALBUMIN in the last 72 hours.  No results for input(s): LIPASE, AMYLASE in the last 72 hours. Cardiac Enzymes No results for input(s): CKTOTAL, CKMB, CKMBINDEX, TROPONINI in the last 72 hours.  BNP: BNP (last 3 results) Recent Labs    01/27/21 1034  BNP 1,733.0*    ProBNP (last 3 results) No results for input(s): PROBNP in the last 8760 hours.   D-Dimer No results for input(s): DDIMER in the last 72 hours. Hemoglobin A1C No results for input(s): HGBA1C in the last 72 hours. Fasting Lipid Panel No results for input(s): CHOL, HDL,  LDLCALC, TRIG, CHOLHDL, LDLDIRECT in the last 72 hours.  Thyroid Function Tests No results for input(s): TSH, T4TOTAL, T3FREE, THYROIDAB in the last 72 hours.  Invalid input(s): FREET3  Other results:   Imaging    No results found.   Medications:     Scheduled Medications:  aspirin  81 mg Oral Daily   Chlorhexidine Gluconate Cloth  6 each Topical Daily   dapagliflozin propanediol  10 mg Oral Daily   dextromethorphan-guaiFENesin  1 tablet Oral BID   digoxin  0.125 mg Oral Daily   feeding supplement  237 mL Oral TID BM   Living Better with Heart Failure Book   Does not apply Once   multivitamin with minerals  1 tablet Oral Daily   nicotine  21 mg Transdermal QHS   pantoprazole  40 mg Oral Daily   rosuvastatin  40 mg Oral Daily   sodium chloride flush  10-40 mL Intracatheter Q12H   sodium chloride flush  3 mL Intravenous Q12H   sodium chloride flush  3 mL Intravenous Q12H   spironolactone  25 mg Oral Daily   Thrombi-Pad  1 each Topical Once   torsemide  20 mg Oral Daily    Infusions:  sodium chloride     sodium chloride Stopped (01/31/21 2357)   amiodarone 30 mg/hr (02/08/21 0007)   ceFEPime (MAXIPIME) IV 2 g (02/08/21 0625)   heparin 1,300 Units/hr (02/07/21 2217)   magnesium sulfate bolus IVPB     milrinone 0.375 mcg/kg/min (02/08/21 0007)   norepinephrine (LEVOPHED) Adult infusion Stopped (02/02/21 1739)   vancomycin 1,000 mg (02/07/21 2112)    PRN Medications: sodium chloride, Place/Maintain arterial line **AND** sodium chloride, acetaminophen, magnesium hydroxide, ondansetron (ZOFRAN) IV, sodium chloride flush, sodium chloride flush    Assessment/Plan   1. Acute systolic CHF: Ischemic cardiomyopathy, symptoms worsened by severe MR.  I reviewed the echo done, showing EF 35-40%, severe hypokinesis inferior and inferolateral walls, moderate LV dilation with mild LVH, mild RV dilation with moderately decreased systolic function, restricted posterior mitral  leaflet and calcified mitral valve with severe mitral regurgitation and at least mild mitral stenosis (mean gradient 8 mmHg), PASP 60. Per report, echo at [facility] showed EF 55-60% with severe MR in 10/22.  It was a technically difficult study per report.  It is possible that he had a new ACS event prior to admission with reported drop in EF though HS-TnI with mild elevation and no trend suggests that it was not immediately prior to admission.  Low output noted with co-ox 39% initially, lactate elevated when he was initially admitted.  Currently on milrinone 0.375 with co-ox 67%.  CVP 6.  - Continue  milrinone 0.375 in setting of severe MR.   - Continue digoxin 0.125 daily.  - Continue torsemide 20 mg daily  - Continue spironolactone 25 mg daily.  - Add Farxiga 10 mg daily.  2. CAD: S/p CABG 2006.  As above, with fall in EF and CHF exacerbation, possible ACS prior to admission.  However, mild HS-TnI elevation with no trend suggests that ACS was not immediately prior to admission.  Current elevation is likely demand ischemia from volume overload. LHC on 11/14 showed patent LIMA-LAD with SVG occluded at aorta (does not look new); there was complex 80% proximal ramus stenosis.  Initially assumed that the SVG was to the ramus, but was subsequently got the CABG operative report from [place] and it looks like it was an SVG-PLV.  There was minimal native RCA disease. LAD territory well-supplied by LIMA. No chest pain.  - Continue heparin gtt while off Xarelto.  - ASA 81 - Crestor 40 - Will need SVG-ramus with valve surgery.   3.  Mitral valve disease: Initial echo showed posterior MV leaflet restricted with severe MR, suspected primarily infarct-related MR given LV dilation and inferior/inferolateral severe hypokinesis.  However, TEE on 11/16 showed vegetation (not bulky but clearly present) on the posterior and anterior leaflets with poor leaflet coaptation, suggesting endocarditis may be the major cause of  severe MR.  Reviewed with ID, suspect the mitral vegetations have been present for a while (severe MR on 10/22 echo as well, does not appear to have active infection).  - Seen by Dr. Cyndia Bent, will need mitral valve replacement. Tentatively scheduled for 12/1  - Will need decayed teeth removed prior => to have dental surgery today, can hold heparin gtt prior.  4. 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  ?due to sedentary lifestyle + ?genetic predisposition.   - Was on Xarelto PTA, now heparin gtt.  Restart Xarelto prior to discharge.  5. Smoking: I strongly encouraged him to quit.  6. PNA: Cultures 11/18 NGTD.  RML PNA on CT chest.  - He completed course of abx initially for PNA.  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 He is able to walk on the stump.  - PT following 8. 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  There is also mobile vegetation that appears adherent to plaque in the proximal descending thoracic aorta.  CT head showed no evidence for embolic disease.  Possible source would be due to poor dentition. Blood cultures from 11/10 and 11/16 NGTD.  He was initially on abx for PNA.  We re-drew cultures on 11/16.  ESR is 1, CRP 1.7.  WBCs elevated still but afebrile.  At this point, I am not sure that we are going to find a source by culture as he had been already partially treated when we found the endocarditis (abx for PNA) and suspect that the endocarditis is old as he had severe MR also on echo in 10/22. ID following.  - Repeat blood cultures drawn 11/18 -> NGTD - Per ID, continue 6 wks of IV abx to 12/27 => vancomycin/cefepime.  9. Microcytic anemia - Iron stores low, had Feraheme   As he is now milrinone-dependent and lives alone, looks like we will have to keep him in hospital until 12/1 surgery. He will need SNF after discharge.   Length of Stay: [place], MD  02/08/2021, 9:02 AM  Advanced Heart Failure  Team Pager [phone] (M-F; 7a - 5p)  Please contact [facility] for night-coverage after hours (5p -7a ) and weekends on [email or web address]

## 2021-02-08 NOTE — Op Note (Signed)
Department of Dental Medicine        OPERATIVE REPORT   DATE OF SURGERY:   02/08/2021  PATIENT'S NAME:   Mark Garrett DATE OF BIRTH:   Aug 19, 1980 MEDICAL RECORD NUMBER: [id number]  SURGEON:   Joniece Smotherman B. Benson [place], D.M.D.  ASSISTANT:  Molli Posey, DAII  PREOPERATIVE DIAGNOSES:  Dental caries, chronic periodontitis  Patient Active Problem List   Diagnosis Date Noted   Protein-calorie malnutrition, severe 02/03/2021   Endocarditis of mitral valve    Long term current use of anticoagulant therapy    Encounter for preoperative dental examination    Caries    Retained tooth root    Chronic apical periodontitis    Accretions on teeth    Teeth missing    Excessive dental attrition    Chronic periodontitis    Cardiogenic shock ([place]) 01/30/2021   Severe mitral regurgitation 01/29/2021   Serum total bilirubin elevated 01/29/2021   Paroxysmal VT 99991111   Acute systolic CHF (congestive heart failure) ([place]) 01/28/2021   CAP (community acquired pneumonia) 01/27/2021   Pleural effusion on right 01/27/2021   Leukocytosis 01/27/2021   Microcytic anemia 01/27/2021   Hypoalbuminemia 01/27/2021   Elevated brain natriuretic peptide (BNP) level 01/27/2021   NSTEMI (non-ST elevated myocardial infarction) ([place]) 01/27/2021   DVT (deep venous thrombosis) ([place]) 01/27/2021   Diarrhea 01/27/2021   Cigarette smoker 01/27/2021   COPD (chronic obstructive pulmonary disease) ([place]) 01/27/2021   Asthma 01/27/2021    POSTOPERATIVE DIAGNOSES:  Dental caries, chronic periodontitis  PROCEDURES PERFORMED: Extractions of teeth numbers 2, 6, 7, 8, 9, 10, 11, 15, 18 and 28 2 quadrants of alveoloplasty (upper right and upper left quadrants)  ANESTHESIA:  General anesthesia via nasal endotracheal tube.  MEDICATIONS: Local anesthesia with a total utilization of 3 cartridges of 34 mg of lidocaine with 0.018 mg of epinephrine/ea.  SPECIMENS:  10 teeth that were extracted and  discarded  DRAINS/CULTURES:  None  COMPLICATIONS:  None  ESTIMATED BLOOD LOSS:  5 mL  INTRAVENOUS FLUIDS:  10 mL of Lactated ringers solution  INDICATIONS:  The patient was recently diagnosed with severe mitral regurgitation and suspected mitral valve endocarditis.  A medically necessary dental consult was then requested to evaluate the patient for any dental/orofacial infection and their overall oral health.  The patient was examined and subsequently treatment planned for multiple extractions of severely decayed and chronically infected teeth.  This treatment plan was made to decrease the perioperative and postoperative risks and complications associated with dental/orofacial infection from affecting the patient's systemic health.  OPERATIVE FINDINGS:  The patient was examined in operating room number 8.  The indicated teeth were identified and verified for extraction. The patient was noted be affected by severe dental decay, chronic periodontitis and chronic apical periodontitis (associated with retained root tips).  DESCRIPTION OF PROCEDURE:  The patient was identified in the holding area and brought to the main operating room number 8 by the anesthesia team. The patient was then placed in the supine position on the operating table.  General anesthesia was then induced per the anesthesia team. The patient was then prepped and draped in the usual sterile fashion for dental medicine procedures.  A timeout was performed. The patient was identified and procedures were verified. A throat pack was placed at this time. The oral cavity was then thoroughly examined with the findings noted above. The patient was then ready for the dental medicine procedure as follows:   ANESTHESIA: Local anesthesia was  administered sequentially with a total utilization of 3 cartridges each containing 34 mg of lidocaine with 0.018 mg of epinephrine/ea.  Location of anesthesia included nasopalatine nerve block, maxillary  infiltration, lower right mental nerve block and mandibular infiltration where indicated.  ROUTINE EXTRACTIONS: The maxillary left and right quadrants were first approached. The teeth were then subluxated with a series of straight elevators.  Teeth numbers 2, 6, 7, 8, 9, 10, 11 and 15 were then removed with a 150 forceps and rongeurs without complications.  Alveoloplasty was then performed utilizing a ronguers and bone file.  The tissues were approximated and trimmed appropriately to help achieve primary closure.  The surgical sites were then curetted and irrigated with copious amounts of sterile saline.  Surgi Foam was placed in each extraction site.   The surgical sites were closed using 3-0 chromic gut sutures as follows: 2 simple interrupted, 1 continuous interlocking.  The mandibular left and right quadrants were then approached. The teeth were subluxated with a series of straight elevators.  Teeth numbers 18 and 28 were then removed utilizing a 151 forceps and rongeurs without complications.  The tissues were approximated and trimmed appropriately to help achieve primary closure. The surgical sites were then curetted and irrigated with copious amounts of sterile saline.  Surgi Foam was placed in each extraction site.  The surgical sites were closed using 3-0 chromic gut sutures as follows: 2 simple interrupted.   END OF PROCEDURE: Thorough oral irrigation with sterile saline was performed.  Good hemostasis was observed.  The patient was examined for complications, and seeing none, the dental medicine procedure was deemed to be complete.  The throat pack was removed at this time. A series of 4x4 gauze were placed in the mouth to aid hemostasis as needed.  The patient was then handed over to the anesthesia team for final disposition.  After an appropriate amount of time, the patient was extubated and taken to the postanesthsia care unit in stable condition.  All counts were correct for the dental  medicine procedure.    Zaliyah Meikle B. Chales Salmon, D.M.D.

## 2021-02-08 NOTE — Progress Notes (Signed)
Pt returned from OR, VSS, call light within reach, orders checked.  Kalman Jewels, RN 02/08/2021 6:13 PM

## 2021-02-08 NOTE — Progress Notes (Signed)
CARDIAC REHAB PHASE I   Offered to walk with pt. Pt declines at this time, anxious for teeth removal today. Pt aware of need to stay inpatient until surgery. Pt denies questions or concerns at this time. Provided support and encouragement. Will continue to follow.  [id number] Reynold Bowen, RN BSN 02/08/2021 10:28 AM

## 2021-02-08 NOTE — Anesthesia Preprocedure Evaluation (Addendum)
Anesthesia Evaluation  Patient identified by MRN, date of birth, ID band Patient awake    Reviewed: Allergy & Precautions, NPO status , Patient's Chart, lab work & pertinent test results, reviewed documented beta blocker date and time   Airway Mallampati: II  TM Distance: >3 FB Neck ROM: Full    Dental  (+) Poor Dentition, Dental Advisory Given   Pulmonary shortness of breath, with exertion, at rest and lying, asthma , pneumonia, resolved, COPD,  COPD inhaler, Current Smoker and Patient abstained from smoking.,    Pulmonary exam normal breath sounds clear to auscultation + decreased breath sounds      Cardiovascular + Past MI, + CABG, +CHF, + Orthopnea, + PND and + DOE  Normal cardiovascular exam Rhythm:Regular Rate:Normal + Systolic murmurs Echo 02/02/21 1. Left ventricular ejection fraction, by estimation, is 35 to 40%. The left ventricle has moderately decreased function. The left ventricle demonstrates regional wall motion abnormalities with inferior and inferolateral hypokinesis. The left ventricular internal cavity size was mildly dilated.  2. Right ventricular systolic function is mildly reduced. The right ventricular size is normal.  3. Peak RV-RA gradient 33 mmHg. No TV vegetation.  4. No pulmonary valve vegetation.  5. The aortic valve is tricuspid. Aortic valve regurgitation is trivial. No aortic stenosis is present. No AoV vegetation.  6. Left atrial size was moderately dilated. No left atrial/left atrial appendage thrombus was detected.  7. Right atrial size was moderately dilated.  8. No PFO or ASD by color doppler.  9. There was mobile vegetation on both the anterior and posterior mitral leaflets. The leaflets did not completely coapt (?destruction from endocarditis) with severe mitral regurgitation (ERO 0.56 cm^2). There was flattening but not flow reversal in the pulmonary vein systolic doppler pattern. No  significant stenosis.  10. There was extensive plaque in the proximal descending thoracic aorta with what appears to be mobile vegetation attached to it.   Cardiac Cath 01/31/21  Mid LAD lesion is 95% stenosed. .  Ramus-1 lesion is 50% stenosed. .  Ramus-2 lesion is 80% stenosed. .  Mid RCA lesion is 30% stenosed. .  Origin lesion is 100% stenosed.  1.  Elevated R>L heart filling pressure.  2.  Adequate PAPI 3.  Mixed pulmonary venous/pulmonary arterial hypertension.  4.  Prominent V-waves in PCWP tracing 5.  Stable cardiac output on milrinone 0.375 6.  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  Patent LIMA-LAD with 99% mid LAD stenosis. Patent RCA and AV LCx.   Hx/o CABG 2006  On Milrinone gtt   Neuro/Psych negative neurological ROS  negative psych ROS   GI/Hepatic negative GI ROS, Neg liver ROS, Dental caries   Endo/Other  Hyperlipidemia  Renal/GU negative Renal ROS  negative genitourinary   Musculoskeletal negative musculoskeletal ROS (+)   Abdominal   Peds  Hematology negative hematology ROS (+) anemia , Eliquis therapy on hold, on Heparin gtt   Anesthesia Other Findings   Reproductive/Obstetrics                            Anesthesia Physical Anesthesia Plan  ASA: 4  Anesthesia Plan: General   Post-op Pain Management:    Induction: Intravenous  PONV Risk Score and Plan: 2 and Treatment may vary due to age or medical condition and Ondansetron  Airway Management Planned: Nasal ETT  Additional Equipment: Arterial line  Intra-op Plan:   Post-operative Plan: Extubation in OR  Informed Consent: I have  reviewed the patients History and Physical, chart, labs and discussed the procedure including the risks, benefits and alternatives for the proposed anesthesia with the patient or authorized representative who has indicated his/her understanding and acceptance.     Dental advisory given  Plan Discussed with: CRNA  and Anesthesiologist  Anesthesia Plan Comments:         Anesthesia Quick Evaluation

## 2021-02-08 NOTE — Progress Notes (Signed)
  Department of Dental Medicine   PREOPERATIVE NOTE   SERVICE DATE:   02/08/2021  PATIENT'S NAME:   Mark Garrett [facility] MEDICAL RECORD NUMBER:  [id number]   Reuel Lamadrid Southers presents today for dental procedures in the operating room. The patient denies any acute medical or dental changes and agrees to proceed with treatment as planned.   VITALS: BP 100/78   Pulse 75   Temp 98.3 F (36.8 C) (Oral)   Resp 18   Ht 5\' 11"  (1.803 m)   Wt 75.6 kg   SpO2 98%   BMI 23.25 kg/m    LABS: Lab Results  Component Value Date   WBC 14.9 (H) 02/08/2021   HGB 10.7 (L) 02/08/2021   HCT 36.9 (L) 02/08/2021   MCV 71.7 (L) 02/08/2021   PLT 180 02/08/2021   BMET    Component Value Date/Time   NA 133 (L) 02/08/2021 0616   K 3.6 02/08/2021 0616   CL 95 (L) 02/08/2021 0616   CO2 28 02/08/2021 0616   GLUCOSE 97 02/08/2021 0616   BUN 20 02/08/2021 0616   CREATININE 1.34 (H) 02/08/2021 0616   CALCIUM 9.3 02/08/2021 0616   GFRNONAA >60 02/08/2021 0616    Lab Results  Component Value Date   INR 2.5 (H) 01/27/2021   No results found for: PTT   EXAM: No signs of acute dental changes.   ASSESSMENT: Dental caries Chronic periodontitis Retained root tips   PLAN: The patient agrees to proceed with dental treatment in the operating room as previously discussed and planned, and accepts the risks, benefits and complications of the proposed treatment.  He understands the plan to extract all infected, severely decayed and/or periodontally compromised teeth including but not limited to teeth numbers 2, 3, 13, 15, 18, 21, 30 and potentially #6-#10. The patient is aware of the risk for bleeding, bruising, swelling, infection, pain, nerve damage, sinus involvement, root tip fracture, mandible fracture and the risks of complications associated with the anesthesia.  The patient also is aware of the potential for other complications not mentioned above.   Neeka Urista B. 13/12/2020, D.M.D.

## 2021-02-08 NOTE — Anesthesia Procedure Notes (Signed)
Procedure Name: Intubation Date/Time: 02/08/2021 2:16 PM Performed by: Lovie Chol, CRNA Pre-anesthesia Checklist: Patient identified, Emergency Drugs available, Suction available and Patient being monitored Patient Re-evaluated:Patient Re-evaluated prior to induction Oxygen Delivery Method: Circle System Utilized Preoxygenation: Pre-oxygenation with 100% oxygen Induction Type: IV induction Ventilation: Mask ventilation without difficulty Laryngoscope Size: Miller and 3 Grade View: Grade I Nasal Tubes: Right, Nasal prep performed and Nasal Rae Number of attempts: 1 Airway Equipment and Method: Stylet and Oral airway Placement Confirmation: ETT inserted through vocal cords under direct vision, positive ETCO2 and breath sounds checked- equal and bilateral Secured at: 29 cm Tube secured with: Tape Dental Injury: Teeth and Oropharynx as per pre-operative assessment

## 2021-02-08 NOTE — Discharge Instructions (Addendum)
Discharge Instructions:  1. You may shower, please wash incisions daily with soap and water and keep dry.  If you wish to cover wounds with dressing you may do so but please keep clean and change daily.  No tub baths or swimming until incisions have completely healed.  If your incisions become red or develop any drainage please call our office at [phone]  2. No Driving until cleared by Dr. Sharee Pimple office and you are no longer using narcotic pain medications  3. Monitor your weight daily.. Please use the same scale and weigh at same time... If you gain 5-10 lbs in 48 hours with associated lower extremity swelling, please contact our office at [phone]  4. Fever of 101.5 for at least 24 hours with no source, please contact our office at [phone]  5. Activity- up as tolerated, please walk at least 3 times per day.  Avoid strenuous activity, no lifting, pushing, or pulling with your arms over 8-10 lbs for a minimum of 6 weeks  6. If any questions or concerns arise, please do not hesitate to contact our office at [phone]    [facility] Department of Dental Medicine [place] B. Chales Salmon, D.M.D. Phone: [phone] Fax: [phone]    MOUTH CARE AFTER SURGERY   FACTS: Ice used in ice bag helps keep the swelling down, and can help lessen the pain. It is easier to treat pain BEFORE it happens. Spitting disturbs the clot and may cause bleeding to start again, or to get worse. Smoking delays healing and can cause complications. Sharing prescriptions can be dangerous.  Do not take medications not recently prescribed for you. Antibiotics may stop birth control pills from working.  Use other means of birth control while on antibiotics. Warm salt water rinses after the first 24 hours will help lessen the swelling:  Use 1/2 teaspoonful of table salt per oz.of water.  DO NOT: Do not spit.   Do not drink through a straw. Strongly advised not to smoke, dip snuff or chew tobacco  at least for 3 days. Do not eat sharp or crunchy foods.  Avoid the area of surgery when chewing. Do not stop your antibiotics before your instructions say to do so. Do not eat hot foods until bleeding has stopped.  If you need to, let your food cool down to room temperature.  EXPECT: Some swelling, especially first 2-3 days. Soreness or discomfort in varying degrees.  Follow your dentist's instructions about how to handle pain before it starts. Pinkish saliva or light blood in saliva, or on your pillow in the morning.  This can last around 24 hours. Bruising inside or outside the mouth.  This may not show up until 2-3 days after surgery.  Don't worry, it will go away in time. Pieces of "bone" may work themselves loose.  It's OK.  If they bother you, let us know.    WHAT TO DO IMMEDIATELY AFTER SURGERY: Bite on gauze with steady pressure for 30-45 minutes at a time.  Switch out the gauze after 30-45 minutes for clean gauze, and continue this for 1-2 hours or until bleeding subsides. Do not chew on the gauze. Do not lie down flat.  Raise your head support especially for the first 24 hours. Apply ice to your face on the side of the surgery.  You may apply it 20 minutes on and a few minutes off.  Ice for 8-12 hours.  You may use ice up to 24 hours. Before the numbness wears off,  take a pain pill as instructed. Prescription pain medication is not always required.  SWELLING: Expect swelling for the first couple of days.  It should get better after that. If swelling increases 3 days or so after surgery, let us know as soon as possible.  FEVER: Take Tylenol every 4 hours if needed to lower your temperature, especially if it is at 100F or higher. Drink lots of fluids. If the fever does not go away, let us know.  BREATHING TROUBLE: Any unusual difficulty breathing means you have to have someone bring you to the emergency room ASAP.  BLEEDING: Light oozing is expected for 24 hours or  so. Prop head up with pillows. Do not spit. Do not confuse bright red fresh flowing blood with lots of saliva colored with a little bit of blood. If you notice some bleeding, place gauze or a tea bag where it is bleeding and apply CONSTANT pressure by biting down for 1 hour.  Avoid talking during this time.  Do not remove the gauze or tea bag during this hour to "check" the bleeding. If you notice bright RED bleeding FLOWING out of particular area, and filling the floor of your mouth, put a wad of gauze on that area, bite down firmly and constantly.  Call us immediately.  If we're closed, have someone bring you to the emergency room.  ORAL HYGIENE: Brush your teeth as usual after meals and before bedtime. Use a soft toothbrush around the area of surgery. DO NOT AVOID BRUSHING.  Otherwise bacteria(germs) will grow and may delay healing or encourage infection. Since you cannot spit, just gently rinse and let the water flow out of your mouth. DO NOT SWISH HARD.  EATING: Cool liquids are a good point to start.  Increase to soft foods as tolerated.   PRESCRIPTIONS: Follow the directions for your prescriptions exactly as written. If your doctor gave you a narcotic pain medication, do not drive, operate machinery or drink alcohol when on that medication.   QUESTIONS? Call our office during office hours [phone] or call the Emergency Room at [phone].    Information on my medicine - Coumadin   (Warfarin)  Why was Coumadin prescribed for you? Coumadin was prescribed for you because you have a blood clot or a medical condition that can cause an increased risk of forming blood clots. Blood clots can cause serious health problems by blocking the flow of blood to the heart, lung, or brain. Coumadin can prevent harmful blood clots from forming. As a reminder your indication for Coumadin is:  Blood Clot Prevention after Heart Valve Surgery  What test will check on my response to  Coumadin? While on Coumadin (warfarin) you will need to have an INR test regularly to ensure that your dose is keeping you in the desired range. The INR (international normalized ratio) number is calculated from the result of the laboratory test called prothrombin time (PT).  If an INR APPOINTMENT HAS NOT ALREADY BEEN MADE FOR YOU please schedule an appointment to have this lab work done by your health care provider within 7 days. Your INR goal is a number between: 2.5 - 3.5.  Ask your health care provider during an office visit what your goal INR is.  What  do you need to  know  About  COUMADIN? Take Coumadin (warfarin) exactly as prescribed by your healthcare provider about the same time each day.  DO NOT stop taking without talking to the doctor who prescribed the medication.  Stopping without other blood clot prevention medication to take the place of Coumadin may increase your risk of developing a new clot or stroke.  Get refills before you run out.  What do you do if you miss a dose? If you miss a dose, take it as soon as you remember on the same day then continue your regularly scheduled regimen the next day.  Do not take two doses of Coumadin at the same time.  Important Safety Information A possible side effect of Coumadin (Warfarin) is an increased risk of bleeding. You should call your healthcare provider right away if you experience any of the following: Bleeding from an injury or your nose that does not stop. Unusual colored urine (red or dark brown) or unusual colored stools (red or black). Unusual bruising for unknown reasons. A serious fall or if you hit your head (even if there is no bleeding).  Some foods or medicines interact with Coumadin (warfarin) and might alter your response to warfarin. To help avoid this: Eat a balanced diet, maintaining a consistent amount of Vitamin K. Notify your provider about major diet changes you plan to make. Avoid alcohol or limit your intake  to 1 drink for women and 2 drinks for men per day. (1 drink is 5 oz. wine, 12 oz. beer, or 1.5 oz. liquor.)  Make sure that ANY health care provider who prescribes medication for you knows that you are taking Coumadin (warfarin).  Also make sure the healthcare provider who is monitoring your Coumadin knows when you have started a new medication including herbals and non-prescription products.  Coumadin (Warfarin)  Major Drug Interactions  Increased Warfarin Effect Decreased Warfarin Effect  Alcohol (large quantities) Antibiotics (esp. Septra/Bactrim, Flagyl, Cipro) Amiodarone (Cordarone) Aspirin (ASA) Cimetidine (Tagamet) Megestrol (Megace) NSAIDs (ibuprofen, naproxen, etc.) Piroxicam (Feldene) Propafenone (Rythmol SR) Propranolol (Inderal) Isoniazid (INH) Posaconazole (Noxafil) Barbiturates (Phenobarbital) Carbamazepine (Tegretol) Chlordiazepoxide (Librium) Cholestyramine (Questran) Griseofulvin Oral Contraceptives Rifampin Sucralfate (Carafate) Vitamin K   Coumadin (Warfarin) Major Herbal Interactions  Increased Warfarin Effect Decreased Warfarin Effect  Garlic Ginseng Ginkgo biloba Coenzyme Q10 Green tea St. John's wort    Coumadin (Warfarin) FOOD Interactions  Eat a consistent number of servings per week of foods HIGH in Vitamin K (1 serving =  cup)  Collards (cooked, or boiled & drained) Kale (cooked, or boiled & drained) Mustard greens (cooked, or boiled & drained) Parsley *serving size only =  cup Spinach (cooked, or boiled & drained) Swiss chard (cooked, or boiled & drained) Turnip greens (cooked, or boiled & drained)  Eat a consistent number of servings per week of foods MEDIUM-HIGH in Vitamin K (1 serving = 1 cup)  Asparagus (cooked, or boiled & drained) Broccoli (cooked, boiled & drained, or raw & chopped) Brussel sprouts (cooked, or boiled & drained) *serving size only =  cup Lettuce, raw (green leaf, endive, romaine) Spinach, raw Turnip greens,  raw & chopped   These websites have more information on Coumadin (warfarin):  FailFactory.se; VeganReport.com.au;

## 2021-02-08 NOTE — Progress Notes (Signed)
Occupational Therapy Treatment Patient Details Name: Mark Garrett MRN: [id number] DOB: [birth year] Today's Date: 02/08/2021   History of present illness Pt is a 40 y.o. male admitted 01/27/21 due to CHF exacerbation. S/p R/L heart cath and coronary graft angiography on 11/14. 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 Will need MVR, tentatively scheduled for 12/1. PMH includes CHF, CAD (s/p CABG 2006), mitral valve disease, traumatic amputation of L foot.   OT comments  Session focused on initial education of sternal precautions during ADLs/IADLs in preparation for potential MVR. Handout provided and pt verbalizing understanding with previous sternal precaution experience. Also educated pt on UE HEP to maximize strength and endurance during admission while awaiting MVR. Pt with good carryover after initial education. Encouraged pt to continue mobilizing with staff and daily completion of exercises. Advised pt to cease UE HEP after MVR until therapy re-evaluation and assessment of exercise appropriateness s/p procedure.   Recommendations for follow up therapy are one component of a multi-disciplinary discharge planning process, led by the attending physician.  Recommendations may be updated based on patient status, additional functional criteria and insurance authorization.    Follow Up Recommendations  No OT follow up    Assistance Recommended at Discharge PRN  Equipment Recommendations  None recommended by OT    Recommendations for Other Services      Precautions / Restrictions Precautions Precautions: Fall;Other (comment) Precaution Comments: h/o L forefoot amputation (does not have prosthetic in room; ambulates with/without it at home); initiated educ on sternal precautions in preparation for likely MVR Restrictions Weight Bearing Restrictions: No       Mobility Bed Mobility Overal bed mobility: Modified Independent              General bed mobility comments: increased time, assist for lines    Transfers                         Balance Overall balance assessment: Needs assistance Sitting-balance support: No upper extremity supported;Feet supported Sitting balance-Leahy Scale: Normal                                     ADL either performed or assessed with clinical judgement   ADL Overall ADL's : Needs assistance/impaired                                       General ADL Comments: Began education on sternal precautions for ADLs (pending MVR 12/1) with focus on dressing, toileting and body mechanics for IADLs. Educated on UE HEP to maximize strength/endurance during admission and advised to cease exercises after heart surgery until therapy re-evals    Extremity/Trunk Assessment Upper Extremity Assessment Upper Extremity Assessment: Overall WFL for tasks assessed   Lower Extremity Assessment Lower Extremity Assessment: Defer to PT evaluation        Vision   Vision Assessment?: No apparent visual deficits   Perception     Praxis      Cognition Arousal/Alertness: Awake/alert Behavior During Therapy: WFL for tasks assessed/performed Overall Cognitive Status: Within Functional Limits for tasks assessed  Exercises Exercises: General Upper Extremity General Exercises - Upper Extremity Shoulder Flexion: AROM;Strengthening;Both;10 reps;Seated;Theraband Theraband Level (Shoulder Flexion): Level 2 (Red) Shoulder Horizontal ABduction: AROM;Strengthening;Both;10 reps;Seated;Theraband Theraband Level (Shoulder Horizontal Abduction): Level 2 (Red) Elbow Flexion: AROM;Strengthening;Both;10 reps;Seated;Theraband Theraband Level (Elbow Flexion): Level 2 (Red) Elbow Extension: AROM;Strengthening;Both;10 reps;Seated;Theraband Theraband Level (Elbow Extension): Level 2 (Red)   Shoulder Instructions        General Comments      Pertinent Vitals/ Pain       Pain Assessment: No/denies pain  Home Living                                          Prior Functioning/Environment              Frequency  Min 2X/week        Progress Toward Goals  OT Goals(current goals can now be found in the care plan section)  Progress towards OT goals: Progressing toward goals  Acute Rehab OT Goals Patient Stated Goal: get tooth extraction over with OT Goal Formulation: With patient Time For Goal Achievement: 02/15/21 Potential to Achieve Goals: Good ADL Goals Pt Will Transfer to Toilet: ambulating;Independently Pt Will Perform Tub/Shower Transfer: Tub transfer;Independently;ambulating Pt/caregiver will Perform Home Exercise Program: Increased strength;Both right and left upper extremity;With theraband;Independently;With written HEP provided Additional ADL Goal #1: Pt to verbalize at least 3 fall prevention strategies to implement at home Additional ADL Goal #2: Pt to increase standing tolerance > 10 min during ADLs/IADLs to maximize overall endurance  Plan Discharge plan remains appropriate    Co-evaluation                 AM-PAC OT "6 Clicks" Daily Activity     Outcome Measure   Help from another person eating meals?: None Help from another person taking care of personal grooming?: A Little Help from another person toileting, which includes using toliet, bedpan, or urinal?: A Little Help from another person bathing (including washing, rinsing, drying)?: A Little Help from another person to put on and taking off regular upper body clothing?: None Help from another person to put on and taking off regular lower body clothing?: A Little 6 Click Score: 20    End of Session    OT Visit Diagnosis: Unsteadiness on feet (R26.81);Other abnormalities of gait and mobility (R26.89)   Activity Tolerance Patient tolerated treatment well   Patient Left in bed;with call  bell/phone within reach   Nurse Communication Mobility status        Time: 6720-9470 OT Time Calculation (min): 19 min  Charges: OT General Charges $OT Visit: 1 Visit OT Treatments $Therapeutic Activity: 8-22 mins  Bradd Canary, OTR/L Acute Rehab Services Office: [phone]   Lorre Munroe 02/08/2021, 8:43 AM

## 2021-02-08 NOTE — Progress Notes (Addendum)
Nutrition Follow-up  DOCUMENTATION CODES:   Severe malnutrition in context of chronic illness (component of social-environmental as well)  INTERVENTION:   When diet advanced after surgery today, resume Ensure Enlive po TID, each supplement provides 350 kcal and 20 grams of protein  Continue MVI with minerals daily  NUTRITION DIAGNOSIS:   Severe Malnutrition related to chronic illness (CHF exacerbation) as evidenced by percent weight loss, severe fat depletion, energy intake < 75% for > or equal to 1 month.  Ongoing  GOAL:   Patient will meet greater than or equal to 90% of their needs  Progressing  MONITOR:   PO intake, Supplement acceptance, Labs, Weight trends, I & O's  REASON FOR ASSESSMENT:   Consult Assessment of nutrition requirement/status  ASSESSMENT:   Pt with PMH significant for asthma, COPD, tobacco abuse, s/p aortic revascularization, and DVT admitted with acute hypoxemic respiratory failure 2/2 acute systolic CHF exacerbation.  Patient reports good intake of meals. He likes drinking the Ensure Enlive supplements between meals. Currently NPO for tooth extractions today.  Previously on a 2 gm sodium diet with meal intakes 100%. Patient is drinking Ensure supplements 2-3 times per day. Encouraged patient to drink Ensure TID after tooth extractions to maximize protein and calorie intake. Discussed need for liquid or softer foods for a few days until able to chew solids.  Plans for mitral valve replacement 12/1.  Patient to remain inpatient until surgery, then will need SNF placement.  SNF placement will resolve his prior food insecurity.  Admission weight 72.6 kg (11/10) Weight 75.6 kg today  Labs reviewed. Na 133  Medications reviewed and include MVI with minerals, Protonix, Aldactone.  Diet Order:   Diet Order             Diet NPO time specified  Diet effective midnight           Diet 2 gram sodium Room service appropriate? Yes; Fluid  consistency: Thin  Diet effective now                   EDUCATION NEEDS:   No education needs have been identified at this time  Skin:  Skin Assessment: Reviewed RN Assessment  Last BM:  11/21  Height:   Ht Readings from Last 1 Encounters:  02/04/21 5\' 11"  (1.803 m)    Weight:   Wt Readings from Last 1 Encounters:  02/08/21 75.6 kg    BMI:  Body mass index is 23.25 kg/m.  Estimated Nutritional Needs:   Kcal:  2000-2200  Protein:  100-110 grams  Fluid:  >/=2L    02/10/21, RD, LDN, CNSC Please refer to Amion for contact information.

## 2021-02-08 NOTE — H&P (View-Only) (Signed)
Patient ID: Mark Garrett, male   DOB: [birth year], 40 y.o.   MRN: [id number]      Advanced Heart Failure Rounding Note  PCP-Cardiologist: None   Subjective:    Remains on milrinone 0.375 with co-ox 67%. CVP 6.   Remains in NSR on amiodarone gtt.   No complaints today. Sitting up in bed eating breakfast. Denies CP. No dyspnea.   TEE (11/16): EF 35-40%, there was mobile vegetation on both the anterior and posterior mitral leaflets.  The leaflets did not completely coapt (?destruction from endocarditis) with severe mitral regurgitation (ERO 0.56 cm^2).  There was extensive plaque in the proximal descending thoracic aorta with what appears to be mobile vegetation attached to it.   LHC/RHC:   Coronary Findings  Diagnostic Dominance: Right Left Anterior Descending  Mid LAD lesion is 95% stenosed.  Ramus Intermedius  Ramus-1 lesion is 50% stenosed.  Ramus-2 lesion is 80% stenosed.  Right Coronary Artery  Mid RCA lesion is 30% stenosed.  Graft To PLV  Origin lesion is 100% stenosed.  LIMA Graft To Dist LAD  Intervention  No interventions have been documented. Right Heart  Right Heart Pressures RHC Procedural Findings: Hemodynamics (mmHg) on milrinone 0.375 RA mean 18 RV 69/20 PA 76/25, mean 44 PCWP mean 22, v-waves to 37 mmHg LV 100/23 AO 100/60  Oxygen saturations: PA 58% AO 97%  Cardiac Output (Fick) 5.33  Cardiac Index (Fick) 2.67 PVR 4.1 WU  Cardiac Output (Thermo) 4.89 Cardiac Index (Thermo) 2.45  PVR 4.5 WU  PAPI: 2.8    Objective:   Weight Range: 75.6 kg Body mass index is 23.25 kg/m.   Vital Signs:   Temp:  [97.8 F (36.6 C)-98.8 F (37.1 C)] 98.4 F (36.9 C) (11/22 0808) Pulse Rate:  [78-85] 82 (11/22 0850) Resp:  [16-18] 16 (11/22 0808) BP: (99-133)/(75-94) 133/94 (11/22 0808) SpO2:  [90 %-98 %] 98 % (11/22 0808) Weight:  [75.6 kg] 75.6 kg (11/22 0352) Last BM Date: 02/06/21  Weight change: Filed Weights   02/06/21 0523 02/07/21 0407  02/08/21 0352  Weight: 71.1 kg 70.9 kg 75.6 kg    Intake/Output:   Intake/Output Summary (Last 24 hours) at 02/08/2021 0902 Last data filed at 02/08/2021 0358 Gross per 24 hour  Intake 2708.1 ml  Output 5600 ml  Net -2891.9 ml      Physical Exam    General: NAD Neck: JVP 7-8 cm, no thyromegaly or thyroid nodule.  Lungs: Clear to auscultation bilaterally with normal respiratory effort. CV: Nondisplaced PMI.  Heart regular S1/S2, no S3/S4, 2/6 HSM apex.  No peripheral edema.   Abdomen: Soft, nontender, no hepatosplenomegaly, no distention.  Skin: Intact without lesions or rashes.  Neurologic: Alert and oriented x 3.  Psych: Normal affect. Extremities: No clubbing or cyanosis.  HEENT: Normal.    Telemetry   NSR 70-80 Personally reviewed   Labs    CBC Recent Labs    02/05/21 1211 02/06/21 0434 02/07/21 0441 02/08/21 0616  WBC 14.0*   < > 13.3* 14.9*  NEUTROABS 9.3*  --   --   --   HGB 10.5*   < > 10.0* 10.7*  HCT 37.5*   < > 34.8* 36.9*  MCV 72.0*   < > 71.3* 71.7*  PLT 204   < > 189 180   < > = values in this interval not displayed.   Basic Metabolic Panel Recent Labs    02/07/21 0441 02/08/21 0616  NA 133* 133*  K 3.9 3.6  CL 96* 95*  CO2 27 28  GLUCOSE 94 97  BUN 19 20  CREATININE 1.15 1.34*  CALCIUM 8.7* 9.3  MG 2.1 2.2   Liver Function Tests No results for input(s): AST, ALT, ALKPHOS, BILITOT, PROT, ALBUMIN in the last 72 hours.  No results for input(s): LIPASE, AMYLASE in the last 72 hours. Cardiac Enzymes No results for input(s): CKTOTAL, CKMB, CKMBINDEX, TROPONINI in the last 72 hours.  BNP: BNP (last 3 results) Recent Labs    01/27/21 1034  BNP 1,733.0*    ProBNP (last 3 results) No results for input(s): PROBNP in the last 8760 hours.   D-Dimer No results for input(s): DDIMER in the last 72 hours. Hemoglobin A1C No results for input(s): HGBA1C in the last 72 hours. Fasting Lipid Panel No results for input(s): CHOL, HDL,  LDLCALC, TRIG, CHOLHDL, LDLDIRECT in the last 72 hours.  Thyroid Function Tests No results for input(s): TSH, T4TOTAL, T3FREE, THYROIDAB in the last 72 hours.  Invalid input(s): FREET3  Other results:   Imaging    No results found.   Medications:     Scheduled Medications:  aspirin  81 mg Oral Daily   Chlorhexidine Gluconate Cloth  6 each Topical Daily   dapagliflozin propanediol  10 mg Oral Daily   dextromethorphan-guaiFENesin  1 tablet Oral BID   digoxin  0.125 mg Oral Daily   feeding supplement  237 mL Oral TID BM   Living Better with Heart Failure Book   Does not apply Once   multivitamin with minerals  1 tablet Oral Daily   nicotine  21 mg Transdermal QHS   pantoprazole  40 mg Oral Daily   rosuvastatin  40 mg Oral Daily   sodium chloride flush  10-40 mL Intracatheter Q12H   sodium chloride flush  3 mL Intravenous Q12H   sodium chloride flush  3 mL Intravenous Q12H   spironolactone  25 mg Oral Daily   Thrombi-Pad  1 each Topical Once   torsemide  20 mg Oral Daily    Infusions:  sodium chloride     sodium chloride Stopped (01/31/21 2357)   amiodarone 30 mg/hr (02/08/21 0007)   ceFEPime (MAXIPIME) IV 2 g (02/08/21 0625)   heparin 1,300 Units/hr (02/07/21 2217)   magnesium sulfate bolus IVPB     milrinone 0.375 mcg/kg/min (02/08/21 0007)   norepinephrine (LEVOPHED) Adult infusion Stopped (02/02/21 1739)   vancomycin 1,000 mg (02/07/21 2112)    PRN Medications: sodium chloride, Place/Maintain arterial line **AND** sodium chloride, acetaminophen, magnesium hydroxide, ondansetron (ZOFRAN) IV, sodium chloride flush, sodium chloride flush    Assessment/Plan   1. Acute systolic CHF: Ischemic cardiomyopathy, symptoms worsened by severe MR.  I reviewed the echo done, showing EF 35-40%, severe hypokinesis inferior and inferolateral walls, moderate LV dilation with mild LVH, mild RV dilation with moderately decreased systolic function, restricted posterior mitral  leaflet and calcified mitral valve with severe mitral regurgitation and at least mild mitral stenosis (mean gradient 8 mmHg), PASP 60. Per report, echo at [facility] showed EF 55-60% with severe MR in 10/22.  It was a technically difficult study per report.  It is possible that he had a new ACS event prior to admission with reported drop in EF though HS-TnI with mild elevation and no trend suggests that it was not immediately prior to admission.  Low output noted with co-ox 39% initially, lactate elevated when he was initially admitted.  Currently on milrinone 0.375 with co-ox 67%.  CVP 6.  - Continue  milrinone 0.375 in setting of severe MR.   - Continue digoxin 0.125 daily.  - Continue torsemide 20 mg daily  - Continue spironolactone 25 mg daily.  - Add Farxiga 10 mg daily.  2. CAD: S/p CABG 2006.  As above, with fall in EF and CHF exacerbation, possible ACS prior to admission.  However, mild HS-TnI elevation with no trend suggests that ACS was not immediately prior to admission.  Current elevation is likely demand ischemia from volume overload. LHC on 11/14 showed patent LIMA-LAD with SVG occluded at aorta (does not look new); there was complex 80% proximal ramus stenosis.  Initially assumed that the SVG was to the ramus, but was subsequently got the CABG operative report from [place] and it looks like it was an SVG-PLV.  There was minimal native RCA disease. LAD territory well-supplied by LIMA. No chest pain.  - Continue heparin gtt while off Xarelto.  - ASA 81 - Crestor 40 - Will need SVG-ramus with valve surgery.   3.  Mitral valve disease: Initial echo showed posterior MV leaflet restricted with severe MR, suspected primarily infarct-related MR given LV dilation and inferior/inferolateral severe hypokinesis.  However, TEE on 11/16 showed vegetation (not bulky but clearly present) on the posterior and anterior leaflets with poor leaflet coaptation, suggesting endocarditis may be the major cause of  severe MR.  Reviewed with ID, suspect the mitral vegetations have been present for a while (severe MR on 10/22 echo as well, does not appear to have active infection).  - Seen by Dr. Cyndia Bent, will need mitral valve replacement. Tentatively scheduled for 12/1  - Will need decayed teeth removed prior => to have dental surgery today, can hold heparin gtt prior.  4. 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  ?due to sedentary lifestyle + ?genetic predisposition.   - Was on Xarelto PTA, now heparin gtt.  Restart Xarelto prior to discharge.  5. Smoking: I strongly encouraged him to quit.  6. PNA: Cultures 11/18 NGTD.  RML PNA on CT chest.  - He completed course of abx initially for PNA.  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 He is able to walk on the stump.  - PT following 8. 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  There is also mobile vegetation that appears adherent to plaque in the proximal descending thoracic aorta.  CT head showed no evidence for embolic disease.  Possible source would be due to poor dentition. Blood cultures from 11/10 and 11/16 NGTD.  He was initially on abx for PNA.  We re-drew cultures on 11/16.  ESR is 1, CRP 1.7.  WBCs elevated still but afebrile.  At this point, I am not sure that we are going to find a source by culture as he had been already partially treated when we found the endocarditis (abx for PNA) and suspect that the endocarditis is old as he had severe MR also on echo in 10/22. ID following.  - Repeat blood cultures drawn 11/18 -> NGTD - Per ID, continue 6 wks of IV abx to 12/27 => vancomycin/cefepime.  9. Microcytic anemia - Iron stores low, had Feraheme   As he is now milrinone-dependent and lives alone, looks like we will have to keep him in hospital until 12/1 surgery. He will need SNF after discharge.   Length of Stay: [place], MD  02/08/2021, 9:02 AM  Advanced Heart Failure  Team Pager [phone] (M-F; 7a - 5p)  Please contact [facility] for night-coverage after hours (5p -7a ) and weekends on [email or web address]

## 2021-02-08 NOTE — Interval H&P Note (Signed)
History and Physical Interval Note:  02/08/2021 1:29 PM  Mark Garrett  has presented today for surgery, with the diagnosis of dental caries.  The various methods of treatment have been discussed with the patient and family. After consideration of risks, benefits and other options for treatment, the patient has consented to  Procedure(s): MULTIPLE EXTRACTION WITH ALVEOLOPLASTY (N/A) as a surgical intervention.  The patient's history has been reviewed, patient examined, no change in status, stable for surgery.  I have reviewed the patient's chart and labs.  Questions were answered to the patient's satisfaction.     Sharman Cheek

## 2021-02-08 NOTE — Progress Notes (Signed)
ANTICOAGULATION CONSULT NOTE  Pharmacy Consult IV heparin Indication: pre-existing DVT  Allergies  Allergen Reactions   Iodide Rash    Patient Measurements: Height: 5\' 11"  (180.3 cm) Weight: 75.6 kg (166 lb 10.7 oz) IBW/kg (Calculated) : 75.3  Heparin Dosing Weight: 72.6 kg  Vital Signs: Temp: 98.4 F (36.9 C) (11/22 0808) Temp Source: Oral (11/22 0808) BP: 133/94 (11/22 0808) Pulse Rate: 82 (11/22 0850)  Labs: Recent Labs    02/06/21 0434 02/06/21 1400 02/07/21 0441 02/08/21 0616  HGB 9.9*  --  10.0* 10.7*  HCT 34.9*  --  34.8* 36.9*  PLT 206  --  189 180  HEPARINUNFRC 0.23* 0.38 0.30 0.39  CREATININE 1.07  --  1.15 1.34*     Estimated Creatinine Clearance: 76.5 mL/min (A) (by C-G formula based on SCr of 1.34 mg/dL (H)).   Medications:  Medications Prior to Admission  Medication Sig Dispense Refill Last Dose   XARELTO 20 MG TABS tablet Take 20 mg by mouth every evening.   01/25/2021 at 1900   Infusions:   sodium chloride     sodium chloride Stopped (01/31/21 2357)   amiodarone 30 mg/hr (02/08/21 0007)   ceFEPime (MAXIPIME) IV 2 g (02/08/21 0625)   heparin 1,300 Units/hr (02/07/21 2217)   magnesium sulfate bolus IVPB     milrinone 0.375 mcg/kg/min (02/08/21 0007)   norepinephrine (LEVOPHED) Adult infusion Stopped (02/02/21 1739)   vancomycin 1,000 mg (02/07/21 2112)    Assessment: 40 y.o. male with medical history significant for asthma/COPD, recent DVT, tobacco abuse who presented with 3 days SOB and CP. CT showed no evidence of pulmonary embolism. 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 Pharmacy consulted to start heparin infusion.   Heparin level is therapeutic at 0.39 on 1300 units/hr. No s/sx of bleeding or infusion issues noted. CBC stable. Heparin infusion held 1000 for planned dental extraction this afternoon @ 1300.   Goal of Therapy:  Heparin level 0.3-0.7 units/ml Monitor platelets by anticoagulation  protocol: Yes   Plan:  Holding heparin drip for tooth extraction F/U resumption - continue heparin infusion at 1300 units/hr Get 6 hour heparin level after restart Continue to monitor daily CBC, heparin level Monitor s/sx bleeding F/u TCTS plans for MVR after extraction  43, PharmD PGY1 Pharmacy Resident 02/08/2021  9:03 AM  Please check [email or web address] for unit-specific pharmacy phone numbers.

## 2021-02-08 NOTE — Anesthesia Postprocedure Evaluation (Signed)
Anesthesia Post Note  Patient: [facility]  Procedure(s) Performed: MULTIPLE EXTRACTION WITH ALVEOLOPLASTY     Patient location during evaluation: PACU Anesthesia Type: General Level of consciousness: awake and alert and oriented Pain management: pain level controlled Vital Signs Assessment: post-procedure vital signs reviewed and stable Respiratory status: spontaneous breathing, nonlabored ventilation, respiratory function stable and patient connected to nasal cannula oxygen Cardiovascular status: blood pressure returned to baseline and stable Postop Assessment: no apparent nausea or vomiting Anesthetic complications: no   No notable events documented.  Last Vitals:  Vitals:   02/08/21 1606 02/08/21 1623  BP: 107/89 109/87  Pulse: 86 83  Resp: 12 (!) 21  Temp:    SpO2: 93% 93%    Last Pain:  Vitals:   02/08/21 1623  TempSrc:   PainSc: 0-No pain                 Jerri Glauser A.

## 2021-02-09 ENCOUNTER — Encounter (HOSPITAL_COMMUNITY): Payer: Self-pay | Admitting: Dentistry

## 2021-02-09 LAB — CULTURE, BLOOD (ROUTINE X 2)
Culture: NO GROWTH
Culture: NO GROWTH
Special Requests: ADEQUATE
Special Requests: ADEQUATE

## 2021-02-09 LAB — CBC
HCT: 38.2 % — ABNORMAL LOW (ref 39.0–52.0)
Hemoglobin: 10.9 g/dL — ABNORMAL LOW (ref 13.0–17.0)
MCH: 20.6 pg — ABNORMAL LOW (ref 26.0–34.0)
MCHC: 28.5 g/dL — ABNORMAL LOW (ref 30.0–36.0)
MCV: 72.1 fL — ABNORMAL LOW (ref 80.0–100.0)
Platelets: 180 10*3/uL (ref 150–400)
RBC: 5.3 MIL/uL (ref 4.22–5.81)
RDW: 28.1 % — ABNORMAL HIGH (ref 11.5–15.5)
WBC: 16.1 10*3/uL — ABNORMAL HIGH (ref 4.0–10.5)
nRBC: 0 % (ref 0.0–0.2)

## 2021-02-09 LAB — BASIC METABOLIC PANEL
Anion gap: 11 (ref 5–15)
BUN: 21 mg/dL — ABNORMAL HIGH (ref 6–20)
CO2: 26 mmol/L (ref 22–32)
Calcium: 9.6 mg/dL (ref 8.9–10.3)
Chloride: 95 mmol/L — ABNORMAL LOW (ref 98–111)
Creatinine, Ser: 1.29 mg/dL — ABNORMAL HIGH (ref 0.61–1.24)
GFR, Estimated: >60 mL/min (ref >60)
Glucose, Bld: 129 mg/dL — ABNORMAL HIGH (ref 70–99)
Potassium: 4.6 mmol/L (ref 3.5–5.1)
Sodium: 132 mmol/L — ABNORMAL LOW (ref 135–145)

## 2021-02-09 LAB — HEPARIN LEVEL (UNFRACTIONATED)
Heparin Unfractionated: 0.23 IU/mL — ABNORMAL LOW (ref 0.30–0.70)
Heparin Unfractionated: 0.43 IU/mL (ref 0.30–0.70)
Heparin Unfractionated: 0.5 IU/mL (ref 0.30–0.70)

## 2021-02-09 LAB — COOXEMETRY PANEL
Carboxyhemoglobin: 2.2 % — ABNORMAL HIGH (ref 0.5–1.5)
Methemoglobin: 0.9 % (ref 0.0–1.5)
O2 Saturation: 63.7 %
Total hemoglobin: 11.3 g/dL — ABNORMAL LOW (ref 12.0–16.0)

## 2021-02-09 LAB — MAGNESIUM: Magnesium: 2.1 mg/dL (ref 1.7–2.4)

## 2021-02-09 NOTE — Progress Notes (Signed)
Physical Therapy Treatment Patient Details Name: Mark Garrett MRN: [id number] DOB: 1981/03/15 Today's Date: 02/09/2021   History of Present Illness Pt is a 40 y.o. male admitted 01/27/21 due to CHF exacerbation. S/p R/L heart cath and coronary graft angiography on 11/14. 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 Will need MVR, tentatively scheduled for 12/1. PMH includes CHF, CAD (s/p CABG 2006), mitral valve disease, traumatic amputation of L foot.    PT Comments    Pt making good progress; tolerates activity well.  Educated on sternal precautions and min cues with transfers. Continue POC.    Recommendations for follow up therapy are one component of a multi-disciplinary discharge planning process, led by the attending physician.  Recommendations may be updated based on patient status, additional functional criteria and insurance authorization.  Follow Up Recommendations  No PT follow up     Assistance Recommended at Discharge PRN  Equipment Recommendations  None recommended by PT    Recommendations for Other Services       Precautions / Restrictions Precautions Precautions: Fall;Other (comment) Precaution Comments: h/o L forefoot amputation (does not have prosthetic in room; ambulates with/without it at home); initiated educ on sternal precautions in preparation for likely MVR     Mobility  Bed Mobility Overal bed mobility: Needs Assistance Bed Mobility: Supine to Sit;Sit to Supine     Supine to sit: Supervision Sit to supine: Supervision   General bed mobility comments: supervision for lines (multiple IV lines/PICC)    Transfers Overall transfer level: Needs assistance Equipment used: None Transfers: Sit to/from Stand Sit to Stand: Supervision           General transfer comment: Supervision for lines; cues for sternal precautions for prep for sx    Ambulation/Gait Ambulation/Gait assistance:  Supervision Gait Distance (Feet): 300 Feet Assistive device: None Gait Pattern/deviations: Step-through pattern;Decreased stride length Gait velocity: Decreased     General Gait Details: Slow, steady gait without DME; supervision for safety lines; pt ambulates on residual limb (no prostheti)   Stairs Stairs: Yes Stairs assistance: Supervision Stair Management: One rail Right;Step to pattern;Forwards Number of Stairs: 6 General stair comments: up/down 2 steps x 3 (due to lines); close supervision for safety   Wheelchair Mobility    Modified Rankin (Stroke Patients Only)       Balance Overall balance assessment: Needs assistance Sitting-balance support: No upper extremity supported;Feet supported Sitting balance-Leahy Scale: Normal     Standing balance support: No upper extremity supported;During functional activity Standing balance-Leahy Scale: Good                              Cognition Arousal/Alertness: Awake/alert Behavior During Therapy: WFL for tasks assessed/performed Overall Cognitive Status: Within Functional Limits for tasks assessed                                          Exercises      General Comments        Pertinent Vitals/Pain Pain Assessment: No/denies pain    Home Living                          Prior Function            PT Goals (current goals can now be found  in the care plan section) Progress towards PT goals: Progressing toward goals    Frequency    Min 2X/week      PT Plan Current plan remains appropriate    Co-evaluation              AM-PAC PT "6 Clicks" Mobility   Outcome Measure  Help needed turning from your back to your side while in a flat bed without using bedrails?: None Help needed moving from lying on your back to sitting on the side of a flat bed without using bedrails?: None Help needed moving to and from a bed to a chair (including a wheelchair)?: A  Little Help needed standing up from a chair using your arms (e.g., wheelchair or bedside chair)?: A Little Help needed to walk in hospital room?: A Little Help needed climbing 3-5 steps with a railing? : A Little 6 Click Score: 20    End of Session Equipment Utilized During Treatment: Gait belt Activity Tolerance: Patient tolerated treatment well Patient left: with call bell/phone within reach;in bed Nurse Communication: Mobility status PT Visit Diagnosis: Unsteadiness on feet (R26.81);Muscle weakness (generalized) (M62.81)     Time: 1530-1540 PT Time Calculation (min) (ACUTE ONLY): 10 min  Charges:  $Gait Training: 8-22 mins                     Anise Salvo, PT Acute Rehab Services Pager [phone] [name] [phone]    [name] 02/09/2021, 3:50 PM

## 2021-02-09 NOTE — Progress Notes (Signed)
CARDIAC REHAB PHASE I   PRE:  Rate/Rhythm: 87 SR  BP:  Sitting: 138/119      SaO2: 95 RA  MODE:  Ambulation: 370 ft   POST:  Rate/Rhythm: 96 SR  BP:  Sitting: 157/95    SaO2: 96 RA   Pt agreeable to ambulate. Pt ambulated 377ft in hallway standby assist with slow gait. Pt denies CP, SOB, or dizziness. Pt returned to recliner. Demonstrating between 2250-2500. Encouraged continued ambulation and IS. Will continue to follow.  [id number] Reynold Bowen, RN BSN 02/09/2021 9:56 AM

## 2021-02-09 NOTE — Progress Notes (Signed)
ANTICOAGULATION CONSULT NOTE  Pharmacy Consult heparin Indication: pre-existing DVT  Allergies  Allergen Reactions   Iodide Rash    Patient Measurements: Height: 5\' 11"  (180.3 cm) Weight: 70.5 kg (155 lb 6.8 oz) IBW/kg (Calculated) : 75.3  Heparin Dosing Weight: 72.6 kg  Vital Signs: Temp: 98.6 F (37 C) (11/23 0344) Temp Source: Oral (11/23 0344) BP: 115/80 (11/23 0344) Pulse Rate: 72 (11/23 0344)  Labs: Recent Labs    02/06/21 1400 02/07/21 0441 02/07/21 0441 02/08/21 0616 02/09/21 0500  HGB  --  10.0*   < > 10.7* 10.9*  HCT  --  34.8*  --  36.9* 38.2*  PLT  --  189  --  180 180  HEPARINUNFRC 0.38 0.30  --  0.39  --   CREATININE  --  1.15  --  1.34* 1.29*   < > = values in this interval not displayed.     Estimated Creatinine Clearance: 74.4 mL/min (A) (by C-G formula based on SCr of 1.29 mg/dL (H)).   Medications:  Medications Prior to Admission  Medication Sig Dispense Refill Last Dose   XARELTO 20 MG TABS tablet Take 20 mg by mouth every evening.   01/25/2021 at 1900   Infusions:   sodium chloride     sodium chloride Stopped (01/31/21 2357)   ceFEPime (MAXIPIME) IV 2 g (02/09/21 0606)   heparin 1,300 Units/hr (02/09/21 0009)   lactated ringers 10 mL/hr at 02/08/21 1826   magnesium sulfate bolus IVPB     milrinone 0.375 mcg/kg/min (02/08/21 2356)   norepinephrine (LEVOPHED) Adult infusion Stopped (02/02/21 1739)   vancomycin 1,000 mg (02/08/21 2152)    Assessment: 40 y.o. male with medical history significant for asthma/COPD, recent DVT, tobacco abuse who presented with 3 days SOB and CP. CT showed no evidence of pulmonary embolism. 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 Pharmacy consulted to start heparin infusion.   Heparin level of 0.23 is subtherapeutic on 1300 units/hr - infusion was held 10:00 yesterday and restarted 00:09, level drawn appropriately. No s/sx of bleeding or infusion issues noted. CBC  stable.  Goal of Therapy:  Heparin level 0.3-0.7 units/ml Monitor platelets by anticoagulation protocol: Yes   Plan:  Increase heparin infusion to 1450 units/hr 6 hour heparin level  Continue to monitor daily CBC, heparin level Monitor s/sx bleeding F/u TCTS plans for MVR after extraction  45, PharmD PGY1 Pharmacy Resident 02/09/2021  7:07 AM  Please check [email or web address] for unit-specific pharmacy phone numbers.

## 2021-02-09 NOTE — Progress Notes (Signed)
Patient ID: Mark Garrett, male   DOB: 17-Jul-1980, 40 y.o.   MRN: [id number]      Advanced Heart Failure Rounding Note  PCP-Cardiologist: None   Subjective:    Remains on milrinone 0.375 with co-ox 64%. CVP 6.   Remains in NSR on po amiodarone.   Teeth out yesterday for surgery.   No complaints today. Has walked in halls without dyspnea.    TEE (11/16): EF 35-40%, there was mobile vegetation on both the anterior and posterior mitral leaflets.  The leaflets did not completely coapt (?destruction from endocarditis) with severe mitral regurgitation (ERO 0.56 cm^2).  There was extensive plaque in the proximal descending thoracic aorta with what appears to be mobile vegetation attached to it.   LHC/RHC:   Coronary Findings  Diagnostic Dominance: Right Left Anterior Descending  Mid LAD lesion is 95% stenosed.  Ramus Intermedius  Ramus-1 lesion is 50% stenosed.  Ramus-2 lesion is 80% stenosed.  Right Coronary Artery  Mid RCA lesion is 30% stenosed.  Graft To PLV  Origin lesion is 100% stenosed.  LIMA Graft To Dist LAD  Intervention  No interventions have been documented. Right Heart  Right Heart Pressures RHC Procedural Findings: Hemodynamics (mmHg) on milrinone 0.375 RA mean 18 RV 69/20 PA 76/25, mean 44 PCWP mean 22, v-waves to 37 mmHg LV 100/23 AO 100/60  Oxygen saturations: PA 58% AO 97%  Cardiac Output (Fick) 5.33  Cardiac Index (Fick) 2.67 PVR 4.1 WU  Cardiac Output (Thermo) 4.89 Cardiac Index (Thermo) 2.45  PVR 4.5 WU  PAPI: 2.8    Objective:   Weight Range: 70.5 kg Body mass index is 21.68 kg/m.   Vital Signs:   Temp:  [98.1 F (36.7 C)-99.1 F (37.3 C)] 98.7 F (37.1 C) (11/23 0755) Pulse Rate:  [72-88] 83 (11/23 0755) Resp:  [8-21] 19 (11/23 0755) BP: (98-145)/(73-101) 128/85 (11/23 0755) SpO2:  [92 %-98 %] 96 % (11/23 0755) Arterial Line BP: (146-152)/(78) 152/78 (11/22 1623) Weight:  [70.5 kg] 70.5 kg (11/23 0344) Last BM Date:  02/08/21  Weight change: Filed Weights   02/07/21 0407 02/08/21 0352 02/09/21 0344  Weight: 70.9 kg 75.6 kg 70.5 kg    Intake/Output:   Intake/Output Summary (Last 24 hours) at 02/09/2021 0843 Last data filed at 02/08/2021 2356 Gross per 24 hour  Intake 530 ml  Output 3900 ml  Net -3370 ml      Physical Exam    General: NAD Neck: No JVD, no thyromegaly or thyroid nodule.  Lungs: Clear to auscultation bilaterally with normal respiratory effort. CV: Nondisplaced PMI.  Heart regular S1/S2, no S3/S4, 3/6 HSM apex.  No peripheral edema.   Abdomen: Soft, nontender, no hepatosplenomegaly, no distention.  Skin: Intact without lesions or rashes.  Neurologic: Alert and oriented x 3.  Psych: Normal affect. Extremities: No clubbing or cyanosis.  HEENT: Normal.    Telemetry   NSR 70-80 Personally reviewed   Labs    CBC Recent Labs    02/08/21 0616 02/09/21 0500  WBC 14.9* 16.1*  HGB 10.7* 10.9*  HCT 36.9* 38.2*  MCV 71.7* 72.1*  PLT 180 150   Basic Metabolic Panel Recent Labs    02/08/21 0616 02/09/21 0500  NA 133* 132*  K 3.6 4.6  CL 95* 95*  CO2 28 26  GLUCOSE 97 129*  BUN 20 21*  CREATININE 1.34* 1.29*  CALCIUM 9.3 9.6  MG 2.2 2.1   Liver Function Tests No results for input(s): AST, ALT, ALKPHOS, BILITOT, PROT,  ALBUMIN in the last 72 hours.  No results for input(s): LIPASE, AMYLASE in the last 72 hours. Cardiac Enzymes No results for input(s): CKTOTAL, CKMB, CKMBINDEX, TROPONINI in the last 72 hours.  BNP: BNP (last 3 results) Recent Labs    01/27/21 1034  BNP 1,733.0*    ProBNP (last 3 results) No results for input(s): PROBNP in the last 8760 hours.   D-Dimer No results for input(s): DDIMER in the last 72 hours. Hemoglobin A1C No results for input(s): HGBA1C in the last 72 hours. Fasting Lipid Panel No results for input(s): CHOL, HDL, LDLCALC, TRIG, CHOLHDL, LDLDIRECT in the last 72 hours.  Thyroid Function Tests No results for  input(s): TSH, T4TOTAL, T3FREE, THYROIDAB in the last 72 hours.  Invalid input(s): FREET3  Other results:   Imaging    No results found.   Medications:     Scheduled Medications:  amiodarone  200 mg Oral BID   aspirin  81 mg Oral Daily   Chlorhexidine Gluconate Cloth  6 each Topical Daily   dapagliflozin propanediol  10 mg Oral Daily   dextromethorphan-guaiFENesin  1 tablet Oral BID   digoxin  0.125 mg Oral Daily   feeding supplement  237 mL Oral TID BM   Living Better with Heart Failure Book   Does not apply Once   multivitamin with minerals  1 tablet Oral Daily   nicotine  21 mg Transdermal QHS   pantoprazole  40 mg Oral Daily   rosuvastatin  40 mg Oral Daily   sodium chloride flush  10-40 mL Intracatheter Q12H   sodium chloride flush  3 mL Intravenous Q12H   sodium chloride flush  3 mL Intravenous Q12H   spironolactone  25 mg Oral Daily   Thrombi-Pad  1 each Topical Once   torsemide  20 mg Oral Daily    Infusions:  sodium chloride     sodium chloride Stopped (01/31/21 2357)   ceFEPime (MAXIPIME) IV 2 g (02/09/21 0606)   heparin 1,300 Units/hr (02/09/21 0009)   lactated ringers 10 mL/hr at 02/08/21 1826   magnesium sulfate bolus IVPB     milrinone 0.375 mcg/kg/min (02/08/21 2356)   norepinephrine (LEVOPHED) Adult infusion Stopped (02/02/21 1739)   vancomycin 1,000 mg (02/08/21 2152)    PRN Medications: sodium chloride, Place/Maintain arterial line **AND** sodium chloride, acetaminophen, magnesium hydroxide, ondansetron (ZOFRAN) IV, sodium chloride flush, sodium chloride flush    Assessment/Plan   1. Acute systolic CHF: Ischemic cardiomyopathy, symptoms worsened by severe MR.  I reviewed the echo done, showing EF 35-40%, severe hypokinesis inferior and inferolateral walls, moderate LV dilation with mild LVH, mild RV dilation with moderately decreased systolic function, restricted posterior mitral leaflet and calcified mitral valve with severe mitral  regurgitation and at least mild mitral stenosis (mean gradient 8 mmHg), PASP 60. Per report, echo at [facility] showed EF 55-60% with severe MR in 10/22.  It was a technically difficult study per report.  It is possible that he had a new ACS event prior to admission with reported drop in EF though HS-TnI with mild elevation and no trend suggests that it was not immediately prior to admission.  Low output noted with co-ox 39% initially, lactate elevated when he was initially admitted.  Currently on milrinone 0.375 with co-ox 64%.  CVP 6. Creatinine stable at 1.29.  - Continue milrinone 0.375 in setting of severe MR.   - Continue digoxin 0.125 daily.  - Continue torsemide 20 mg daily  - Continue spironolactone 25 mg  daily.  - Continue Farxiga 10 mg daily.  2. CAD: S/p CABG 2006.  As above, with fall in EF and CHF exacerbation, possible ACS prior to admission.  However, mild HS-TnI elevation with no trend suggests that ACS was not immediately prior to admission.  Current elevation is likely demand ischemia from volume overload. LHC on 11/14 showed patent LIMA-LAD with SVG occluded at aorta (does not look new); there was complex 80% proximal ramus stenosis.  Initially assumed that the SVG was to the ramus, but was subsequently got the CABG operative report from [place] and it looks like it was an SVG-PLV.  There was minimal native RCA disease. LAD territory well-supplied by LIMA. No chest pain.  - Continue heparin gtt while off Xarelto.  - ASA 81 - Crestor 40 - Will need SVG-ramus with valve surgery.   3.  Mitral valve disease: Initial echo showed posterior MV leaflet restricted with severe MR, suspected primarily infarct-related MR given LV dilation and inferior/inferolateral severe hypokinesis.  However, TEE on 11/16 showed vegetation (not bulky but clearly present) on the posterior and anterior leaflets with poor leaflet coaptation, suggesting endocarditis may be the major cause of severe MR.  Reviewed  with ID, suspect the mitral vegetations have been present for a while (severe MR on 10/22 echo as well, does not appear to have active infection).  - Seen by Dr. Cyndia Bent, will need mitral valve replacement. Currently scheduled for 12/1  - He has had dental surgery to remove decayed teeth pre-op.   4. 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  ?due to sedentary lifestyle + ?genetic predisposition.   - Was on Xarelto PTA, now heparin gtt.  Restart Xarelto prior to discharge.  5. Smoking: I strongly encouraged him to quit.  6. PNA: Cultures 11/18 NGTD.  RML PNA on CT chest.  - He completed course of abx initially for PNA.  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 He is able to walk on the stump.  - PT following 8. 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  There is also mobile vegetation that appears adherent to plaque in the proximal descending thoracic aorta.  CT head showed no evidence for embolic disease.  Possible source would be due to poor dentition. Blood cultures from 11/10 and 11/16 NGTD.  He was initially on abx for PNA.  We re-drew cultures on 11/16.  ESR is 1, CRP 1.7.  WBCs elevated still but afebrile.  At this point, I am not sure that we are going to find a source by culture as he had been already partially treated when we found the endocarditis (abx for PNA) and suspect that the endocarditis is old as he had severe MR also on echo in 10/22. ID following.  - Repeat blood cultures drawn 11/18 -> NGTD - Per ID, continue 6 wks of IV abx to 12/27 => vancomycin/cefepime.  9. Microcytic anemia - Iron stores low, had Feraheme   As he is now milrinone-dependent and lives alone, looks like we will have to keep him in hospital until 12/1 surgery. If TCTS has any cancellations or reschedules, he is ready to go earlier.  He will need SNF after discharge.   Length of Stay: [place], [place]  02/09/2021, 8:43 AM  Advanced Heart  Failure Team Pager [phone] (M-F; 7a - 5p)  Please contact [facility] for night-coverage after hours (5p -7a ) and weekends on [email or web address]

## 2021-02-09 NOTE — Progress Notes (Signed)
ANTICOAGULATION CONSULT NOTE  Pharmacy Consult heparin Indication: pre-existing DVT  Allergies  Allergen Reactions   Iodide Rash    Patient Measurements: Height: 5\' 11"  (180.3 cm) Weight: 70.5 kg (155 lb 6.8 oz) IBW/kg (Calculated) : 75.3  Heparin Dosing Weight: 72.6 kg  Vital Signs: Temp: 98.8 F (37.1 C) (11/23 1122) Temp Source: Tympanic (11/23 1122) BP: 133/93 (11/23 1122) Pulse Rate: 82 (11/23 1122)  Labs: Recent Labs    02/07/21 0441 02/08/21 0616 02/09/21 0500 02/09/21 0833 02/09/21 1600  HGB 10.0* 10.7* 10.9*  --   --   HCT 34.8* 36.9* 38.2*  --   --   PLT 189 180 180  --   --   HEPARINUNFRC 0.30 0.39  --  0.23* 0.43  CREATININE 1.15 1.34* 1.29*  --   --      Estimated Creatinine Clearance: 74.4 mL/min (A) (by C-G formula based on SCr of 1.29 mg/dL (H)).   Medications:  Medications Prior to Admission  Medication Sig Dispense Refill Last Dose   XARELTO 20 MG TABS tablet Take 20 mg by mouth every evening.   01/25/2021 at 1900   Infusions:   sodium chloride     sodium chloride Stopped (01/31/21 2357)   ceFEPime (MAXIPIME) IV Stopped (02/09/21 1522)   heparin 1,450 Units/hr (02/09/21 1651)   lactated ringers 10 mL/hr at 02/09/21 1651   magnesium sulfate bolus IVPB     milrinone 0.375 mcg/kg/min (02/09/21 1651)   vancomycin Stopped (02/09/21 1000)    Assessment: 40 y.o. male with medical history significant for asthma/COPD, recent DVT, tobacco abuse who presented with 3 days SOB and CP. CT showed no evidence of pulmonary embolism. 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 Pharmacy consulted to start heparin infusion.   Heparin level of 0.43 is therapeutic on heparin 1450 units/hr. Level drawn appropriately. No issues with IV infusion or bleeding noted per RN.   Goal of Therapy:  Heparin level 0.3-0.7 units/ml Monitor platelets by anticoagulation protocol: Yes   Plan:  Continue heparin 1450 units/hr  Check 6  hour confirmatory heparin level  Continue to monitor daily CBC, heparin level Monitor s/sx bleeding F/u TCTS plans for MVR after extraction  45, PharmD, BCPS Clinical Pharmacist 02/09/2021 5:37 PM

## 2021-02-09 NOTE — Progress Notes (Signed)
Mobility Specialist: Progress Note   02/09/21 1412  Mobility  Activity Ambulated in hall  Level of Assistance Contact guard assist, steadying assist  Assistive Device None  Distance Ambulated (ft) 380 ft  Mobility Ambulated with assistance in hallway  Mobility Response Tolerated well  Mobility performed by Mobility specialist  Bed Position Chair  $Mobility charge 1 Mobility   Post-Mobility: 88 HR  Pt c/o his legs feeling stiff during ambulation, otherwise no c/o. Pt set up at the sink to get washed up with NT after walk.   [facility] Cataldo Cosgriff Mobility Specialist Mobility Specialist Phone #1: [phone] Mobility Specialist Phone #2: [phone]

## 2021-02-10 ENCOUNTER — Inpatient Hospital Stay (HOSPITAL_COMMUNITY): Payer: Self-pay

## 2021-02-10 DIAGNOSIS — I38 Endocarditis, valve unspecified: Secondary | ICD-10-CM

## 2021-02-10 DIAGNOSIS — D72823 Leukemoid reaction: Secondary | ICD-10-CM

## 2021-02-10 DIAGNOSIS — T826XXA Infection and inflammatory reaction due to cardiac valve prosthesis, initial encounter: Secondary | ICD-10-CM

## 2021-02-10 DIAGNOSIS — I059 Rheumatic mitral valve disease, unspecified: Secondary | ICD-10-CM

## 2021-02-10 DIAGNOSIS — Z7901 Long term (current) use of anticoagulants: Secondary | ICD-10-CM

## 2021-02-10 LAB — BASIC METABOLIC PANEL
Anion gap: 11 (ref 5–15)
BUN: 25 mg/dL — ABNORMAL HIGH (ref 6–20)
CO2: 27 mmol/L (ref 22–32)
Calcium: 9.4 mg/dL (ref 8.9–10.3)
Chloride: 93 mmol/L — ABNORMAL LOW (ref 98–111)
Creatinine, Ser: 1.32 mg/dL — ABNORMAL HIGH (ref 0.61–1.24)
GFR, Estimated: >60 mL/min (ref >60)
Glucose, Bld: 113 mg/dL — ABNORMAL HIGH (ref 70–99)
Potassium: 4.4 mmol/L (ref 3.5–5.1)
Sodium: 131 mmol/L — ABNORMAL LOW (ref 135–145)

## 2021-02-10 LAB — CBC
HCT: 36.6 % — ABNORMAL LOW (ref 39.0–52.0)
Hemoglobin: 10.6 g/dL — ABNORMAL LOW (ref 13.0–17.0)
MCH: 21 pg — ABNORMAL LOW (ref 26.0–34.0)
MCHC: 29 g/dL — ABNORMAL LOW (ref 30.0–36.0)
MCV: 72.5 fL — ABNORMAL LOW (ref 80.0–100.0)
Platelets: 191 10*3/uL (ref 150–400)
RBC: 5.05 MIL/uL (ref 4.22–5.81)
RDW: 28.4 % — ABNORMAL HIGH (ref 11.5–15.5)
WBC: 31.9 10*3/uL — ABNORMAL HIGH (ref 4.0–10.5)
nRBC: 0 % (ref 0.0–0.2)

## 2021-02-10 LAB — MAGNESIUM: Magnesium: 2.3 mg/dL (ref 1.7–2.4)

## 2021-02-10 LAB — COOXEMETRY PANEL
Carboxyhemoglobin: 2 % — ABNORMAL HIGH (ref 0.5–1.5)
Carboxyhemoglobin: 1.7 % — ABNORMAL HIGH (ref 0.5–1.5)
Methemoglobin: 1 % (ref 0.0–1.5)
Methemoglobin: 0.8 % (ref 0.0–1.5)
O2 Saturation: 57.9 %
O2 Saturation: 51.4 %
Total hemoglobin: 10.7 g/dL — ABNORMAL LOW (ref 12.0–16.0)
Total hemoglobin: 10.2 g/dL — ABNORMAL LOW (ref 12.0–16.0)

## 2021-02-10 LAB — HEPARIN LEVEL (UNFRACTIONATED): Heparin Unfractionated: 0.62 IU/mL (ref 0.30–0.70)

## 2021-02-10 NOTE — Progress Notes (Addendum)
Subjective: No new complaints   Antibiotics:  Anti-infectives (From admission, onward)    Start     Dose/Rate Route Frequency Ordered Stop   02/06/21 2100  vancomycin (VANCOREADY) IVPB 1000 mg/200 mL  Status:  Discontinued       See Hyperspace for full Linked Orders Report.   1,000 mg 200 mL/hr over 60 Minutes Intravenous Every 12 hours 02/06/21 0802 02/10/21 1031   02/06/21 0900  vancomycin (VANCOREADY) IVPB 1500 mg/300 mL       See Hyperspace for full Linked Orders Report.   1,500 mg 150 mL/hr over 120 Minutes Intravenous Every 12 hours 02/06/21 0802 02/06/21 1215   02/06/21 0900  ceFEPIme (MAXIPIME) 2 g in sodium chloride 0.9 % 100 mL IVPB        2 g 200 mL/hr over 30 Minutes Intravenous Every 8 hours 02/06/21 0802     02/03/21 1600  vancomycin (VANCOREADY) IVPB 1750 mg/350 mL  Status:  Discontinued        1,750 mg 175 mL/hr over 120 Minutes Intravenous Every 24 hours 02/02/21 1541 02/02/21 1659   02/03/21 1600  vancomycin (VANCOREADY) IVPB 1000 mg/200 mL  Status:  Discontinued        1,000 mg 200 mL/hr over 60 Minutes Intravenous Every 24 hours 02/02/21 1659 02/04/21 0821   02/02/21 1630  ceFEPIme (MAXIPIME) 2 g in sodium chloride 0.9 % 100 mL IVPB  Status:  Discontinued        2 g 200 mL/hr over 30 Minutes Intravenous Every 8 hours 02/02/21 1535 02/04/21 0821   02/02/21 1630  vancomycin (VANCOREADY) IVPB 1500 mg/300 mL        1,500 mg 150 mL/hr over 120 Minutes Intravenous  Once 02/02/21 1535 02/02/21 1932   01/28/21 1700  cefTRIAXone (ROCEPHIN) 1 g in sodium chloride 0.9 % 100 mL IVPB  Status:  Discontinued        1 g 200 mL/hr over 30 Minutes Intravenous Every 24 hours 01/27/21 2119 01/28/21 1213   01/28/21 1300  amoxicillin-clavulanate (AUGMENTIN) 875-125 MG per tablet 1 tablet        1 tablet Oral Every 12 hours 01/28/21 1213 02/01/21 2116   01/27/21 1730  cefTRIAXone (ROCEPHIN) 1 g in sodium chloride 0.9 % 100 mL IVPB        1 g 200 mL/hr over 30 Minutes  Intravenous  Once 01/27/21 1725 01/27/21 1831   01/27/21 1730  azithromycin (ZITHROMAX) 500 mg in sodium chloride 0.9 % 250 mL IVPB  Status:  Discontinued        500 mg 250 mL/hr over 60 Minutes Intravenous Every 24 hours 01/27/21 1725 01/28/21 1213       Medications: Scheduled Meds:  amiodarone  200 mg Oral BID   aspirin  81 mg Oral Daily   Chlorhexidine Gluconate Cloth  6 each Topical Daily   dapagliflozin propanediol  10 mg Oral Daily   dextromethorphan-guaiFENesin  1 tablet Oral BID   digoxin  0.125 mg Oral Daily   feeding supplement  237 mL Oral TID BM   Living Better with Heart Failure Book   Does not apply Once   multivitamin with minerals  1 tablet Oral Daily   nicotine  21 mg Transdermal QHS   pantoprazole  40 mg Oral Daily   rosuvastatin  40 mg Oral Daily   sodium chloride flush  10-40 mL Intracatheter Q12H   sodium chloride flush  3 mL Intravenous Q12H   sodium  chloride flush  3 mL Intravenous Q12H   spironolactone  25 mg Oral Daily   Thrombi-Pad  1 each Topical Once   torsemide  20 mg Oral Daily   Continuous Infusions:  sodium chloride     sodium chloride Stopped (01/31/21 2357)   ceFEPime (MAXIPIME) IV 2 g (02/10/21 0539)   heparin 1,450 Units/hr (02/09/21 1800)   lactated ringers 10 mL/hr at 02/09/21 1651   magnesium sulfate bolus IVPB     milrinone 0.375 mcg/kg/min (02/10/21 1007)   PRN Meds:.sodium chloride, Place/Maintain arterial line **AND** sodium chloride, acetaminophen, magnesium hydroxide, ondansetron (ZOFRAN) IV, sodium chloride flush, sodium chloride flush    Objective: Weight change: 0.2 kg  Intake/Output Summary (Last 24 hours) at 02/10/2021 1122 Last data filed at 02/10/2021 1019 Gross per 24 hour  Intake 2155.5 ml  Output 4600 ml  Net -2444.5 ml   Blood pressure 122/89, pulse 72, temperature 98.3 F (36.8 C), temperature source Oral, resp. rate 16, height 5\' 11"  (1.803 m), weight 70.7 kg, SpO2 98 %. Temp:  [98 F (36.7 C)-98.6 F (37  C)] 98.3 F (36.8 C) (11/24 0940) Pulse Rate:  [72-88] 72 (11/24 1008) Resp:  [16-20] 16 (11/24 0940) BP: (122-141)/(89-101) 122/89 (11/24 0940) SpO2:  [94 %-100 %] 98 % (11/24 0940) Weight:  [70.7 kg] 70.7 kg (11/24 0334)  Physical Exam: Physical Exam Constitutional:      Appearance: He is well-developed.  HENT:     Head: Normocephalic and atraumatic.  Eyes:     Conjunctiva/sclera: Conjunctivae normal.  Cardiovascular:     Rate and Rhythm: Normal rate and regular rhythm.     Heart sounds: Murmur heard.    No friction rub. No gallop.  Pulmonary:     Effort: Pulmonary effort is normal. No respiratory distress.     Breath sounds: Normal breath sounds. No stridor. No wheezing or rhonchi.  Abdominal:     General: There is no distension.     Palpations: Abdomen is soft. There is no mass.     Tenderness: There is no abdominal tenderness. There is no guarding.     Hernia: No hernia is present.  Musculoskeletal:        General: Normal range of motion.     Cervical back: Normal range of motion and neck supple.  Skin:    General: Skin is warm and dry.     Findings: No erythema or rash.  Neurological:     General: No focal deficit present.     Mental Status: He is alert and oriented to person, place, and time.  Psychiatric:        Mood and Affect: Mood normal.        Behavior: Behavior normal.        Thought Content: Thought content normal.        Judgment: Judgment normal.    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     Sedimentation Rate No results for  input(s): ESRSEDRATE in the last 72 hours. C-Reactive Protein No results for input(s): CRP in the last 72 hours.  Micro Results: Recent Results (from the past 720 hour(s))  Resp Panel by RT-PCR (Flu A&B, Covid) Nasopharyngeal Swab     Status: None   Collection Time: 01/27/21 10:09 AM   Specimen: Nasopharyngeal Swab; Nasopharyngeal(NP) swabs in vial transport medium  Result Value Ref Range Status   SARS Coronavirus 2 by RT PCR NEGATIVE NEGATIVE Final    Comment: (NOTE) SARS-CoV-2 target nucleic acids are NOT DETECTED.  The SARS-CoV-2 RNA is generally detectable in upper respiratory specimens during the acute phase of infection. The lowest concentration of SARS-CoV-2 viral copies this assay can detect is 138 copies/mL. A negative result does not preclude SARS-Cov-2 infection and should not be used as the sole basis for treatment or other patient management decisions. A negative result may occur with  improper specimen collection/handling, submission of specimen other than nasopharyngeal swab, presence of viral mutation(s) within the areas targeted by this assay, and inadequate number of viral copies(<138 copies/mL). A negative result must be combined with clinical observations, patient history, and epidemiological information. The expected result is Negative.  Fact Sheet for Patients:  BloggerCourse.com  Fact Sheet for Healthcare Providers:  SeriousBroker.it  This test is no t yet approved or cleared by the [place] FDA and  has been authorized for detection and/or diagnosis of SARS-CoV-2 by FDA under an Emergency Use Authorization (EUA). This EUA will remain  in effect (meaning this test can be used) for the duration of the COVID-19 declaration under Section 564(b)(1) of the Act, 21 U.S.C.section 360bbb-3(b)(1), unless the authorization is terminated  or revoked sooner.        Influenza A by PCR NEGATIVE NEGATIVE Final   Influenza B by PCR NEGATIVE NEGATIVE Final    Comment: (NOTE) The Xpert Xpress SARS-CoV-2/FLU/RSV plus assay is intended as an aid in the diagnosis of influenza from Nasopharyngeal swab specimens and should not be used as a sole basis for treatment. Nasal washings and aspirates are unacceptable for Xpert Xpress SARS-CoV-2/FLU/RSV testing.  Fact Sheet for Patients: BloggerCourse.com  Fact Sheet for Healthcare Providers: SeriousBroker.it  This test is not yet approved or cleared by the [place] FDA and has been authorized for detection and/or diagnosis of SARS-CoV-2 by FDA under an Emergency Use Authorization (EUA). This EUA will remain in effect (meaning this test can be used) for the duration of the COVID-19 declaration under Section 564(b)(1) of the Act, 21 U.S.C. section 360bbb-3(b)(1), unless the authorization is terminated or revoked.  Performed at [facility], [address] [place], [place] [address]   C Difficile Quick Screen w PCR reflex     Status: None   Collection Time: 01/27/21  8:47 PM   Specimen: STOOL  Result Value Ref Range Status   C Diff antigen NEGATIVE NEGATIVE Final   C Diff toxin NEGATIVE NEGATIVE Final   C Diff interpretation No C. difficile detected.  Final    Comment: Performed at [facility], [address] [place], [place] [address]  Culture, blood (Routine X 2) w Reflex to ID Panel     Status: None   Collection Time: 01/27/21 10:59 PM   Specimen: BLOOD  Result Value Ref Range Status   Specimen Description BLOOD LEFT ANTECUBITAL  Final   Special Requests   Final    BOTTLES DRAWN AEROBIC AND ANAEROBIC Blood Culture adequate volume   Culture   Final  NO GROWTH 5 DAYS Performed at [facility], [place] [address], [place], [place] [address]    Report Status 02/01/2021 FINAL  Final  Culture, blood (Routine X 2) w Reflex  to ID Panel     Status: None   Collection Time: 01/27/21 10:59 PM   Specimen: BLOOD LEFT HAND  Result Value Ref Range Status   Specimen Description BLOOD LEFT HAND  Final   Special Requests   Final    BOTTLES DRAWN AEROBIC AND ANAEROBIC Blood Culture adequate volume   Culture   Final    NO GROWTH 5 DAYS Performed at [facility], [place] [address] [place], [place] [address]    Report Status 02/01/2021 FINAL  Final  MRSA Next Gen by PCR, Nasal     Status: None   Collection Time: 01/31/21  7:04 PM   Specimen: Nasal Mucosa; Nasal Swab  Result Value Ref Range Status   MRSA by PCR Next Gen NOT DETECTED NOT DETECTED Final    Comment: (NOTE) The GeneXpert MRSA Assay (FDA approved for NASAL specimens only), is one component of a comprehensive MRSA colonization surveillance program. It is not intended to diagnose MRSA infection nor to guide or monitor treatment for MRSA infections. Test performance is not FDA approved in patients less than 40 years old. Performed at [facility], [place] [address], [place], [place] [address]   Culture, blood (routine x 2)     Status: None   Collection Time: 02/02/21  2:34 PM   Specimen: BLOOD  Result Value Ref Range Status   Specimen Description BLOOD RIGHT ANTECUBITAL  Final   Special Requests   Final    BOTTLES DRAWN AEROBIC AND ANAEROBIC Blood Culture adequate volume   Culture   Final    NO GROWTH 5 DAYS Performed at [facility], [place] [address], [place], [place] [address]    Report Status 02/07/2021 FINAL  Final  Culture, blood (routine x 2)     Status: None   Collection Time: 02/02/21  2:34 PM   Specimen: BLOOD RIGHT HAND  Result Value Ref Range Status   Specimen Description BLOOD RIGHT HAND  Final   Special Requests   Final    BOTTLES DRAWN AEROBIC AND ANAEROBIC Blood Culture adequate volume   Culture   Final    NO GROWTH 5 DAYS Performed at [facility], [place] [address], [place], [place] [address]    Report Status  02/07/2021 FINAL  Final  Culture, blood (routine x 2)     Status: None   Collection Time: 02/04/21  8:05 AM   Specimen: BLOOD  Result Value Ref Range Status   Specimen Description BLOOD RIGHT ANTECUBITAL  Final   Special Requests   Final    BOTTLES DRAWN AEROBIC AND ANAEROBIC Blood Culture adequate volume   Culture   Final    NO GROWTH 5 DAYS Performed at [facility], [place] [address] [place],  [address]    Report Status 02/09/2021 FINAL  Final  Culture, blood (routine x 2)     Status: None   Collection Time: 02/04/21  8:05 AM   Specimen: BLOOD  Result Value Ref Range Status   Specimen Description BLOOD BLOOD RIGHT HAND  Final   Special Requests AEROBIC BOTTLE ONLY Blood Culture adequate volume  Final   Culture   Final    NO GROWTH 5 DAYS Performed at [facility], [place],  [place] [address]    Report Status 02/09/2021 FINAL  Final  Fungus culture, blood     Status: None (Preliminary result)   Collection Time: 02/05/21 10:41 AM   Specimen: BLOOD  Result Value Ref Range Status   Specimen Description BLOOD RIGHT ANTECUBITAL  Final   Special Requests   Final    BOTTLES DRAWN AEROBIC AND ANAEROBIC Blood Culture adequate volume   Culture   Final    NO GROWTH 4 DAYS Performed at [facility], [place] [address] [place], [place] [address]    Report Status PENDING  Incomplete  Fungus culture, blood     Status: None (Preliminary result)   Collection Time: 02/05/21 10:46 AM   Specimen: BLOOD  Result Value Ref Range Status   Specimen Description BLOOD RIGHT ANTECUBITAL  Final   Special Requests   Final    BOTTLES DRAWN AEROBIC AND ANAEROBIC Blood Culture adequate volume   Culture   Final    NO GROWTH 4 DAYS Performed at [facility], [place] [address], [place], [place] [address]    Report Status PENDING  Incomplete  Acid Fast Smear (AFB)     Status: None   Collection Time: 02/05/21 12:08 PM   Specimen: Vein; Blood  Result Value Ref  Range Status   AFB Specimen Processing Concentration  Final    Comment: (NOTE) Performed At: [facility] [place], [place] [id number] Rush Farmer MD [id number]    Acid Fast Smear QNSAFB  Final    Comment: (NOTE) Test not performed. AFB Smear not performed due to specimen source (blood) or insufficient specimen.    Source (AFB) 2 GREEN SODIUM HEP  Final    Comment: Performed at [facility], [place] [address], [place], [place] [address]  Acid Fast Smear (AFB)     Status: None   Collection Time: 02/05/21 12:08 PM   Specimen: Vein; Blood  Result Value Ref Range Status   AFB Specimen Processing Concentration  Final    Comment: (NOTE) Performed At: [facility] [place], [place] [id number] Rush Farmer MD [id number]    Acid Fast Smear QNSAFB  Final    Comment: (NOTE) Test not performed. AFB Smear not performed due to specimen source (blood) or insufficient specimen.    Source (AFB) 2 GREEN SODIUM HEP  Corrected    Comment: Performed at [facility], [place] [address] [place], [place] [address] CORRECTED ON 11/21 AT 0946: PREVIOUSLY REPORTED AS 4 GREEN SODIUM HEP     Studies/Results: No results found.    Assessment/Plan:  INTERVAL HISTORY: Patient's white blood cell count is gone back up again and there was concerned about infection   Principal Problem:   Acute systolic CHF (congestive heart failure) (HCC) Active Problems:   CAP (community acquired pneumonia)   Pleural effusion on right   Leukocytosis   Microcytic anemia   NSTEMI (non-ST elevated myocardial infarction) (HCC)   DVT (deep venous thrombosis) (HCC)   Cigarette smoker   COPD (chronic obstructive pulmonary disease) (HCC)   Asthma   Severe mitral regurgitation   Serum total bilirubin elevated   Paroxysmal VT   Cardiogenic shock ([place])   Endocarditis of mitral valve   Long term current use of anticoagulant therapy   Encounter for  preoperative dental examination   Caries   Retained tooth root   Chronic apical periodontitis   Accretions on teeth   Teeth missing   Excessive dental attrition  Chronic periodontitis   Protein-calorie malnutrition, severe    Mark Garrett is a 40 y.o. male with coronary artery disease status post coronary bypass grafting DVT who was initially admitted for shortness of breath.  Chest x-ray had shown some patchy opacity in the right lung with CT showing a right middle lobe and small bilateral lobe opacities.  Patient has been on ceftriaxone and azithromycin and then changed over to Augmentin.  Bilateral lower extremity Doppler had shown an indeterminate DVT.  He then underwent TEE which showedvegetation on the anterior and posterior mitral valve leaflets on transesophageal echocardiogram with severe mitral regurgitation.  His blood cultures that were taken on admission on 10 November failed to grow any organism.  He has had repeat blood cultures on antibiotics on the 16th and was changed to vancomycin and cefepime.  Repeat blood cultures on antibiotics failed to yield an organism.  We formally saw the patient on the 16th (Greg Calone and Laurice Record, MD) and had recommended observing him off antibiotics with a low threshold for restarting them.  We did Bartonella antibodies which were all negative as well as Brucella antibodies and Legionella antibodies again which were negative.  Patient did have an ANA which was negative.  He was restarted back on vancomycin and cefepime on the 18th and has been on these antibiotics since then.  He has had problems with cardiogenic shock requiring milrinone.  He is awaiting mitral valve surgery on 1 December.  His white blood cell count is now climbed again to 1900 from 16,000 yesterday.  Note his white blood cell count was as high as 3900 on 12 November and he has not had a normal white count for the majority of his admission.  Also potentially  of signficance the pateint had oral surgery on the 22nd just 2 days ago with extractions of teeth numbers [id number] and 28 and alveoloplasty.  It is certainly possible that his leukocytosis could be a postoperative reaction to surgery.  He has no focal symptoms at all and he is afebrile.  I otherwise do not have a clear target on his exam to explain why his white count has gone back up again.   I will therefore order a CT of the chest abdomen pelvis with contrast to look for occult infection.  I will stop his vancomycin today to reduce nephrotoxic agents in the context of IV contrast and diuretics.  I will follow-up on results of his scan but if the CT chest abdomen and pelvis is unrevealing other thoughts would be to CT his MF area to DC  his central and peripheral line and to confer with hematology oncology.  Along those lines I am wondering if this might be a non infectious Libman-Sacks type endocarditis.  I will order anticardiolipin antibodies.  I spent 60minutes with the patient including face to face counseling of the patient guarding his endocarditis his heart failure his leukocytosis, personally reviewing CT of the chest chest x-ray CBC with differential CMP with GFR BMP with GFR ID antibodies as mentioned above blood cultures TEE personally reviewing radiographs, along with\review of medical records before and during the visit and in coordination of his care.         LOS: 14 days   Alcide Evener 02/10/2021, 11:22 AM

## 2021-02-10 NOTE — Progress Notes (Signed)
Cardiology Progress Note  Patient ID: Mark Garrett MRN: [id number] DOB: [birth year] Date of Encounter: 02/10/2021  Primary Cardiologist: None  Subjective   Chief Complaint: None.  HPI: Vital signs stable.  Awaiting mitral valve surgery on December 1.  Denies any chest pain or trouble breathing.  Remains on milrinone.  ROS:  All other ROS reviewed and negative. Pertinent positives noted in the HPI.     Inpatient Medications  Scheduled Meds:  amiodarone  200 mg Oral BID   aspirin  81 mg Oral Daily   Chlorhexidine Gluconate Cloth  6 each Topical Daily   dapagliflozin propanediol  10 mg Oral Daily   dextromethorphan-guaiFENesin  1 tablet Oral BID   digoxin  0.125 mg Oral Daily   feeding supplement  237 mL Oral TID BM   Living Better with Heart Failure Book   Does not apply Once   multivitamin with minerals  1 tablet Oral Daily   nicotine  21 mg Transdermal QHS   pantoprazole  40 mg Oral Daily   rosuvastatin  40 mg Oral Daily   sodium chloride flush  10-40 mL Intracatheter Q12H   sodium chloride flush  3 mL Intravenous Q12H   sodium chloride flush  3 mL Intravenous Q12H   spironolactone  25 mg Oral Daily   Thrombi-Pad  1 each Topical Once   torsemide  20 mg Oral Daily   Continuous Infusions:  sodium chloride     sodium chloride Stopped (01/31/21 2357)   ceFEPime (MAXIPIME) IV 2 g (02/10/21 0539)   heparin 1,450 Units/hr (02/09/21 1800)   lactated ringers 10 mL/hr at 02/09/21 1651   magnesium sulfate bolus IVPB     milrinone 0.375 mcg/kg/min (02/09/21 2037)   vancomycin 1,000 mg (02/09/21 2159)   PRN Meds: sodium chloride, Place/Maintain arterial line **AND** sodium chloride, acetaminophen, magnesium hydroxide, ondansetron (ZOFRAN) IV, sodium chloride flush, sodium chloride flush   Vital Signs   Vitals:   02/09/21 1900 02/09/21 2012 02/09/21 2320 02/10/21 0334  BP: (!) 134/92 (!) 141/96 (!) 137/101 (!) 132/95  Pulse: 84 86 88 81  Resp: 19 20 17 18   Temp: 98 F  (36.7 C) 98.6 F (37 C) 98.4 F (36.9 C) 98.6 F (37 C)  TempSrc: Oral Oral Oral Oral  SpO2: 98% 96% 100% 94%  Weight:    70.7 kg  Height:        Intake/Output Summary (Last 24 hours) at 02/10/2021 1856 Last data filed at 02/10/2021 0400 Gross per 24 hour  Intake 5386.83 ml  Output 4850 ml  Net 536.83 ml   Last 3 Weights 02/10/2021 02/09/2021 02/08/2021  Weight (lbs) 155 lb 13.8 oz 155 lb 6.8 oz 166 lb 10.7 oz  Weight (kg) 70.7 kg 70.5 kg 75.6 kg      Telemetry  Overnight telemetry shows sinus rhythm in the 80s, which I personally reviewed.   Physical Exam   Vitals:   02/09/21 1900 02/09/21 2012 02/09/21 2320 02/10/21 0334  BP: (!) 134/92 (!) 141/96 (!) 137/101 (!) 132/95  Pulse: 84 86 88 81  Resp: 19 20 17 18   Temp: 98 F (36.7 C) 98.6 F (37 C) 98.4 F (36.9 C) 98.6 F (37 C)  TempSrc: Oral Oral Oral Oral  SpO2: 98% 96% 100% 94%  Weight:    70.7 kg  Height:        Intake/Output Summary (Last 24 hours) at 02/10/2021 3149 Last data filed at 02/10/2021 0400 Gross per 24 hour  Intake 5386.83 ml  Output 4850 ml  Net 536.83 ml    Last 3 Weights 02/10/2021 02/09/2021 02/08/2021  Weight (lbs) 155 lb 13.8 oz 155 lb 6.8 oz 166 lb 10.7 oz  Weight (kg) 70.7 kg 70.5 kg 75.6 kg    Body mass index is 21.74 kg/m.   General: Well nourished, well developed, in no acute distress Head: Atraumatic, normal size  Eyes: PEERLA, EOMI  Neck: Supple, JVD 7 to 8 cm of water Endocrine: No thryomegaly Cardiac: Normal S1, S2; RRR; 3/6 holosystolic murmur Lungs: Clear to auscultation bilaterally, no wheezing, rhonchi or rales  Abd: Soft, nontender, no hepatomegaly  Ext: No edema, pulses 2+ Musculoskeletal: No deformities, BUE and BLE strength normal and equal Skin: Warm and dry, no rashes   Neuro: Alert and oriented to person, place, time, and situation, CNII-XII grossly intact, no focal deficits  Psych: Normal mood and affect   Labs  High Sensitivity Troponin:   Recent Labs   Lab 01/27/21 1032 01/27/21 1245 01/27/21 1742 01/27/21 2259  TROPONINIHS 99* 108* 105* 91*     Cardiac EnzymesNo results for input(s): TROPONINI in the last 168 hours. No results for input(s): TROPIPOC in the last 168 hours.  Chemistry Recent Labs  Lab 02/08/21 0616 02/09/21 0500 02/10/21 0459  NA 133* 132* 131*  K 3.6 4.6 4.4  CL 95* 95* 93*  CO2 28 26 27   GLUCOSE 97 129* 113*  BUN 20 21* 25*  CREATININE 1.34* 1.29* 1.32*  CALCIUM 9.3 9.6 9.4  GFRNONAA >60 >60 >60  ANIONGAP 10 11 11     Hematology Recent Labs  Lab 02/08/21 0616 02/09/21 0500 02/10/21 0459  WBC 14.9* 16.1* 31.9*  RBC 5.15 5.30 5.05  HGB 10.7* 10.9* 10.6*  HCT 36.9* 38.2* 36.6*  MCV 71.7* 72.1* 72.5*  MCH 20.8* 20.6* 21.0*  MCHC 29.0* 28.5* 29.0*  RDW 28.1* 28.1* 28.4*  PLT 180 180 191   BNPNo results for input(s): BNP, PROBNP in the last 168 hours.  DDimer No results for input(s): DDIMER in the last 168 hours.   Radiology  No results found.  Cardiac Studies  TEE 02/02/2021  1. Left ventricular ejection fraction, by estimation, is 35 to 40%. The  left ventricle has moderately decreased function. The left ventricle  demonstrates regional wall motion abnormalities with inferior and  inferolateral hypokinesis. The left  ventricular internal cavity size was mildly dilated.   2. Right ventricular systolic function is mildly reduced. The right  ventricular size is normal.   3. Peak RV-RA gradient 33 mmHg. No TV vegetation.   4. No pulmonary valve vegetation.   5. The aortic valve is tricuspid. Aortic valve regurgitation is trivial.  No aortic stenosis is present. No AoV vegetation.   6. Left atrial size was moderately dilated. No left atrial/left atrial  appendage thrombus was detected.   7. Right atrial size was moderately dilated.   8. No PFO or ASD by color doppler.   9. There was mobile vegetation on both the anterior and posterior mitral  leaflets. The leaflets did not completely coapt  (?destruction from  endocarditis) with severe mitral regurgitation (ERO 0.56 cm^2). There was  flattening but not flow reversal in the   pulmonary vein systolic doppler pattern. No significant stenosis.  10. There was extensive plaque in the proximal descending thoracic aorta  with what appears to be mobile vegetation attached to it.   LHC/RHC 01/31/2021 1.  Elevated R>L heart filling pressure.  2.  Adequate PAPI 3.  Mixed pulmonary venous/pulmonary  arterial hypertension.  4.  Prominent V-waves in PCWP tracing 5.  Stable cardiac output on milrinone 0.375 6.  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  Patent LIMA-LAD with 99% mid LAD stenosis. Patent RCA and AV LCx.   Patient Profile  DEANTRE BOURDON is a 40 y.o. male with COPD, CAD status post CABG, tobacco abuse who was admitted on 01/27/2021 for severe sepsis and acute hypoxic respiratory failure.  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 -Complicated course.  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  Course has been complicated by cardiogenic shock requiring milrinone. -Currently milrinone dependent.  Awaiting mitral valve surgery on 02/17/2021.  Suspect the valve will be replaced.  Does not appear repairable. -For now continue milrinone. -Continue home diuretic agent. -He is on digoxin.  He is on [place].  He is also on Aldactone. -Does not appear to be on ACE/ARB/Arni given hypotension. -s/p tooth extraction  -Blood cultures have been negative. ANA negative. Q fever Ab negative. Bartonella AB negative. Streptolysin Ab negative. RPR negative. HIV negative. -ESR 1, CRP 1.7 -Placed back on antibiotics due to profound leukocytosis.  Leukocytosis  now up to 30,000.  Blood cultures have remained negative.  Remains on antibiotics (Cefepime).  We will reach back out to infectious diseases to see if we need to do any other work-up.  He really complains of nothing.  Possibly he needs to be pan scan for abscess? -Question nonbacterial thrombotic endocarditis?  Admitted with DVT.  He is on heparin drip.  Really unclear what other work-up we will do as he will likely be on anticoagulation indefinitely as he will most likely receive a mechanical mitral valve. -His ESR and CRP are also very normal.  We will suspect these to be severely elevated in the setting of endocarditis.  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  We will continue heparin drip for now.  #Descending aortic plaque with mobile density -TEE images show severe plaque in the proximal descending thoracic aorta.  This and has mobile densities attached to it.  This was concern for agitation. -Could also represent thrombus if he is hypercoagulable.  #Leukocytosis #PNA -White blood cell count continues to climb.  Blood cultures are negative.  ANA negative.  Q fever antibodies negative. -Was admitted with pneumonia and concerns for mitral endocarditis. 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  He is on anticoagulation in setting of DVT. -No diarrhea to suggest C. difficile infection. -We will have infectious diseases reevaluate him to determine if we need to look for abscess or other infectious source to explain his profound leukocytosis.  #CAD s/p CABG #Elevated troponin likely demand ischemia -Suspect elevated troponin is due to demand.  He does have an occluded vein graft to ramus.  His LIMA to LAD is patent. -Continue aspirin and statin.  On heparin drip due to DVT. -Will need SVG to ramus at the time of mitral valve surgery.  #DVT -Heparin drip -Interestingly he was on  Xarelto prior to admission.  He developed DVTs while on this. -I do wonder if he has a hypercoagulable state. Will consider hematology consult.  -Plan for mitral valve surgery.  Likely would benefit from consideration  of transition to Coumadin.  #Traumatic amputation of left foot -Noted.  No issues.  FEN -no IVF -daily labs -diet: heart healthy -dvt ppx: heparin -code: full   For questions or updates, please contact [place] Please consult [email or web address] for contact info under   Time Spent with Patient: I have spent a total of 35 minutes with patient reviewing hospital notes, telemetry, EKGs, labs and examining the patient as well as establishing an assessment and plan that was discussed with the patient.  > 50% of time was spent in direct patient care.    Signed, Addison Naegeli. Audie Box, MD, [place]  02/10/2021 8:22 AM

## 2021-02-10 NOTE — Progress Notes (Incomplete)
ANTICOAGULATION CONSULT NOTE  Pharmacy Consult heparin Indication: pre-existing DVT  Allergies  Allergen Reactions   Iodide Rash    Patient Measurements: Height: 5\' 11"  (180.3 cm) Weight: 70.7 kg (155 lb 13.8 oz) IBW/kg (Calculated) : 75.3  Heparin Dosing Weight: 72.6 kg  Vital Signs: Temp: 98.6 F (37 C) (11/24 0334) Temp Source: Oral (11/24 0334) BP: 132/95 (11/24 0334) Pulse Rate: 81 (11/24 0334)  Labs: Recent Labs    02/08/21 0616 02/09/21 0500 02/09/21 0833 02/09/21 1600 02/09/21 2330 02/10/21 0459  HGB 10.7* 10.9*  --   --   --  10.6*  HCT 36.9* 38.2*  --   --   --  36.6*  PLT 180 180  --   --   --  191  HEPARINUNFRC 0.39  --    < > 0.43 0.50 0.62  CREATININE 1.34* 1.29*  --   --   --  1.32*   < > = values in this interval not displayed.    Estimated Creatinine Clearance: 72.9 mL/min (A) (by C-G formula based on SCr of 1.32 mg/dL (H)).   Assessment: 40 y.o. male with medical history significant for asthma/COPD, recent DVT, tobacco abuse who presented with 3 days SOB and CP. CT showed no evidence of pulmonary embolism. 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 Pharmacy consulted to start heparin infusion.   Heparin level therapeutic (0.5) on 1450 units/hr. Level drawn appropriately. No issues with IV infusion or bleeding noted per RN.   Goal of Therapy:  Heparin level 0.3-0.7 units/ml Monitor platelets by anticoagulation protocol: Yes   Plan:  Continue heparin 1450 units/hr  F/u daily heparin level and CBC Monitor s/sx of bleeding  45, PharmD PGY1 Pharmacy Resident 02/10/2021  8:15 AM  Please check [email or web address] for unit-specific pharmacy phone numbers.

## 2021-02-10 NOTE — Progress Notes (Signed)
ANTICOAGULATION CONSULT NOTE  Pharmacy Consult heparin Indication: pre-existing DVT  Allergies  Allergen Reactions   Iodide Rash    Patient Measurements: Height: 5\' 11"  (180.3 cm) Weight: 70.7 kg (155 lb 13.8 oz) IBW/kg (Calculated) : 75.3  Heparin Dosing Weight: 72.6 kg  Vital Signs: Temp: 98.3 F (36.8 C) (11/24 0940) Temp Source: Oral (11/24 0940) BP: 122/89 (11/24 0940) Pulse Rate: 72 (11/24 1008)  Labs: Recent Labs    02/08/21 0616 02/09/21 0500 02/09/21 0833 02/09/21 1600 02/09/21 2330 02/10/21 0459  HGB 10.7* 10.9*  --   --   --  10.6*  HCT 36.9* 38.2*  --   --   --  36.6*  PLT 180 180  --   --   --  191  HEPARINUNFRC 0.39  --    < > 0.43 0.50 0.62  CREATININE 1.34* 1.29*  --   --   --  1.32*   < > = values in this interval not displayed.     Estimated Creatinine Clearance: 72.9 mL/min (A) (by C-G formula based on SCr of 1.32 mg/dL (H)).   Assessment: 40 y.o. male with medical history significant for asthma/COPD, recent DVT, tobacco abuse who presented with 3 days SOB and CP. CT showed no evidence of pulmonary embolism. 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 Pharmacy consulted to start heparin infusion.   Heparin level therapeutic (0.62) on 1450 units/hr. Level drawn appropriately. No issues with IV infusion or bleeding noted per RN.   Goal of Therapy:  Heparin level 0.3-0.7 units/ml Monitor platelets by anticoagulation protocol: Yes   Plan:  Continue heparin 1450 units/hr  F/u daily heparin level and CBC  45, PharmD PGY1 Pharmacy Resident 02/10/2021  10:37 AM  Please check [email or web address] for unit-specific pharmacy phone numbers.

## 2021-02-10 NOTE — Progress Notes (Signed)
ANTICOAGULATION CONSULT NOTE  Pharmacy Consult heparin Indication: pre-existing DVT  Allergies  Allergen Reactions   Iodide Rash    Patient Measurements: Height: 5\' 11"  (180.3 cm) Weight: 70.5 kg (155 lb 6.8 oz) IBW/kg (Calculated) : 75.3  Heparin Dosing Weight: 72.6 kg  Vital Signs: Temp: 98.4 F (36.9 C) (11/23 2320) Temp Source: Oral (11/23 2320) BP: 137/101 (11/23 2320) Pulse Rate: 88 (11/23 2320)  Labs: Recent Labs    02/07/21 0441 02/08/21 0616 02/09/21 0500 02/09/21 0833 02/09/21 1600 02/09/21 2330  HGB 10.0* 10.7* 10.9*  --   --   --   HCT 34.8* 36.9* 38.2*  --   --   --   PLT 189 180 180  --   --   --   HEPARINUNFRC 0.30 0.39  --  0.23* 0.43 0.50  CREATININE 1.15 1.34* 1.29*  --   --   --      Estimated Creatinine Clearance: 74.4 mL/min (A) (by C-G formula based on SCr of 1.29 mg/dL (H)).   Assessment: 40 y.o. male with medical history significant for asthma/COPD, recent DVT, tobacco abuse who presented with 3 days SOB and CP. CT showed no evidence of pulmonary embolism. 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 Pharmacy consulted to start heparin infusion.   Heparin level therapeutic (0.5) on 1450 units/hr. Level drawn appropriately. No issues with IV infusion or bleeding noted per RN.   Goal of Therapy:  Heparin level 0.3-0.7 units/ml Monitor platelets by anticoagulation protocol: Yes   Plan:  Continue heparin 1450 units/hr  F/u daily heparin level and CBC  45, PharmD, BCPS Please see amion for complete clinical pharmacist phone list 02/10/2021 12:01 AM

## 2021-02-11 LAB — CBC
HCT: 36.4 % — ABNORMAL LOW (ref 39.0–52.0)
Hemoglobin: 10.4 g/dL — ABNORMAL LOW (ref 13.0–17.0)
MCH: 21.2 pg — ABNORMAL LOW (ref 26.0–34.0)
MCHC: 28.6 g/dL — ABNORMAL LOW (ref 30.0–36.0)
MCV: 74.1 fL — ABNORMAL LOW (ref 80.0–100.0)
Platelets: 174 10*3/uL (ref 150–400)
RBC: 4.91 MIL/uL (ref 4.22–5.81)
RDW: 29.3 % — ABNORMAL HIGH (ref 11.5–15.5)
WBC: 21.2 10*3/uL — ABNORMAL HIGH (ref 4.0–10.5)
nRBC: 0.1 % (ref 0.0–0.2)

## 2021-02-11 LAB — COOXEMETRY PANEL
Carboxyhemoglobin: 1.7 % — ABNORMAL HIGH (ref 0.5–1.5)
Methemoglobin: 0.9 % (ref 0.0–1.5)
O2 Saturation: 53.5 %
Total hemoglobin: 10.5 g/dL — ABNORMAL LOW (ref 12.0–16.0)

## 2021-02-11 LAB — BASIC METABOLIC PANEL
Anion gap: 10 (ref 5–15)
BUN: 27 mg/dL — ABNORMAL HIGH (ref 6–20)
CO2: 27 mmol/L (ref 22–32)
Calcium: 9 mg/dL (ref 8.9–10.3)
Chloride: 92 mmol/L — ABNORMAL LOW (ref 98–111)
Creatinine, Ser: 1.37 mg/dL — ABNORMAL HIGH (ref 0.61–1.24)
GFR, Estimated: >60 mL/min (ref >60)
Glucose, Bld: 126 mg/dL — ABNORMAL HIGH (ref 70–99)
Potassium: 4 mmol/L (ref 3.5–5.1)
Sodium: 129 mmol/L — ABNORMAL LOW (ref 135–145)

## 2021-02-11 LAB — MAGNESIUM: Magnesium: 2.2 mg/dL (ref 1.7–2.4)

## 2021-02-11 LAB — HEPARIN LEVEL (UNFRACTIONATED): Heparin Unfractionated: 0.47 IU/mL (ref 0.30–0.70)

## 2021-02-11 LAB — CK: Total CK: 9 U/L — ABNORMAL LOW (ref 49–397)

## 2021-02-11 MED ORDER — SODIUM CHLORIDE 0.9 % IV SOLN
550.0000 mg | Freq: Every day | INTRAVENOUS | Status: DC
  Administered 2021-02-11 – 2021-02-13 (×3): 550 mg via INTRAVENOUS
  Filled 2021-02-11 (×4): qty 11

## 2021-02-11 NOTE — Progress Notes (Signed)
ANTICOAGULATION CONSULT NOTE  Pharmacy Consult heparin Indication: pre-existing DVT  Allergies  Allergen Reactions   Iodide Rash    Patient Measurements: Height: 5\' 11"  (180.3 cm) Weight: 69.1 kg (152 lb 4.8 oz) IBW/kg (Calculated) : 75.3  Heparin Dosing Weight: 72.6 kg  Vital Signs: Temp: 96.8 F (36 C) (11/25 0819) Temp Source: Axillary (11/25 0819) BP: 128/84 (11/25 0819) Pulse Rate: 81 (11/25 0819)  Labs: Recent Labs    02/09/21 0500 02/09/21 0833 02/09/21 2330 02/10/21 0459 02/11/21 0500  HGB 10.9*  --   --  10.6* 10.4*  HCT 38.2*  --   --  36.6* 36.4*  PLT 180  --   --  191 174  HEPARINUNFRC  --    < > 0.50 0.62 0.47  CREATININE 1.29*  --   --  1.32* 1.37*   < > = values in this interval not displayed.    Estimated Creatinine Clearance: 68.7 mL/min (A) (by C-G formula based on SCr of 1.37 mg/dL (H)).  Assessment: 40 y.o. male with medical history significant for asthma/COPD, recent DVT, tobacco abuse who presented with 3 days SOB and CP. CT showed no evidence of pulmonary embolism. 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 Pharmacy consulted to start heparin infusion.   Heparin level therapeutic (0.47) on 1450 units/hr. Level drawn appropriately. CBC stable. No issues with IV infusion or bleeding noted per RN.   Goal of Therapy:  Heparin level 0.3-0.7 units/ml Monitor platelets by anticoagulation protocol: Yes   Plan:  Continue heparin 1450 units/hr  F/u daily heparin level and CBC  45, PharmD PGY1 Pharmacy Resident 02/11/2021  9:51 AM  Please check [email or web address] for unit-specific pharmacy phone numbers.

## 2021-02-11 NOTE — Plan of Care (Signed)
  Problem: Clinical Measurements: Goal: Ability to maintain clinical measurements within normal limits will improve Outcome: Progressing Goal: Cardiovascular complication will be avoided Outcome: Progressing   Problem: Activity: Goal: Risk for activity intolerance will decrease Outcome: Progressing   

## 2021-02-11 NOTE — Progress Notes (Addendum)
Patient ID: XZAVIOR REINIG, male   DOB: 1980/06/24, 40 y.o.   MRN: [id number]      Advanced Heart Failure Rounding Note  PCP-Cardiologist: None   Subjective:    Remains on milrinone 0.375 with co-ox 54%. CVP 65   Remains in NSR on po amiodarone.   Teeth out for surgery.   Denies SOB. Mouth a little sore.   TEE (11/16): EF 35-40%, there was mobile vegetation on both the anterior and posterior mitral leaflets.  The leaflets did not completely coapt (?destruction from endocarditis) with severe mitral regurgitation (ERO 0.56 cm^2).  There was extensive plaque in the proximal descending thoracic aorta with what appears to be mobile vegetation attached to it.   LHC/RHC:   Coronary Findings  Diagnostic Dominance: Right Left Anterior Descending  Mid LAD lesion is 95% stenosed.  Ramus Intermedius  Ramus-1 lesion is 50% stenosed.  Ramus-2 lesion is 80% stenosed.  Right Coronary Artery  Mid RCA lesion is 30% stenosed.  Graft To PLV  Origin lesion is 100% stenosed.  LIMA Graft To Dist LAD  Intervention  No interventions have been documented. Right Heart  Right Heart Pressures RHC Procedural Findings: Hemodynamics (mmHg) on milrinone 0.375 RA mean 18 RV 69/20 PA 76/25, mean 44 PCWP mean 22, v-waves to 37 mmHg LV 100/23 AO 100/60  Oxygen saturations: PA 58% AO 97%  Cardiac Output (Fick) 5.33  Cardiac Index (Fick) 2.67 PVR 4.1 WU  Cardiac Output (Thermo) 4.89 Cardiac Index (Thermo) 2.45  PVR 4.5 WU  PAPI: 2.8    Objective:   Weight Range: 69.1 kg Body mass index is 21.24 kg/m.   Vital Signs:   Temp:  [96.8 F (36 C)-98.9 F (37.2 C)] 96.8 F (36 C) (11/25 0819) Pulse Rate:  [72-81] 81 (11/25 0819) Resp:  [15-18] 18 (11/25 0819) BP: (106-128)/(68-84) 128/84 (11/25 0819) SpO2:  [97 %-100 %] 98 % (11/25 0819) Weight:  [69.1 kg] 69.1 kg (11/25 0400) Last BM Date: 02/09/21  Weight change: Filed Weights   02/09/21 0344 02/10/21 0334 02/11/21 0400   Weight: 70.5 kg 70.7 kg 69.1 kg    Intake/Output:   Intake/Output Summary (Last 24 hours) at 02/11/2021 1004 Last data filed at 02/10/2021 2215 Gross per 24 hour  Intake 1662 ml  Output 5205 ml  Net -3543 ml      Physical Exam   CVP 5-6  General:  No resp difficulty HEENT: normal Neck: supple. no JVD. Carotids 2+ bilat; no bruits. No lymphadenopathy or thryomegaly appreciated. LIJ  Cor: PMI nondisplaced. Regular rate & rhythm. No rubs, gallops. 3/6 HSM  Lungs: clear Abdomen: soft, nontender, nondistended. No hepatosplenomegaly. No bruits or masses. Good bowel sounds. Extremities: no cyanosis, clubbing, rash, edema Neuro: alert & orientedx3, cranial nerves grossly intact. moves all 4 extremities w/o difficulty. Affect pleasant  Telemetry   SR 70-80s with occasional PVCs   Labs    CBC Recent Labs    02/10/21 0459 02/11/21 0500  WBC 31.9* 21.2*  HGB 10.6* 10.4*  HCT 36.6* 36.4*  MCV 72.5* 74.1*  PLT 191 474   Basic Metabolic Panel Recent Labs    02/10/21 0459 02/11/21 0500  NA 131* 129*  K 4.4 4.0  CL 93* 92*  CO2 27 27  GLUCOSE 113* 126*  BUN 25* 27*  CREATININE 1.32* 1.37*  CALCIUM 9.4 9.0  MG 2.3 2.2   Liver Function Tests No results for input(s): AST, ALT, ALKPHOS, BILITOT, PROT, ALBUMIN in the last 72 hours.  No results  for input(s): LIPASE, AMYLASE in the last 72 hours. Cardiac Enzymes No results for input(s): CKTOTAL, CKMB, CKMBINDEX, TROPONINI in the last 72 hours.  BNP: BNP (last 3 results) Recent Labs    01/27/21 1034  BNP 1,733.0*    ProBNP (last 3 results) No results for input(s): PROBNP in the last 8760 hours.   D-Dimer No results for input(s): DDIMER in the last 72 hours. Hemoglobin A1C No results for input(s): HGBA1C in the last 72 hours. Fasting Lipid Panel No results for input(s): CHOL, HDL, LDLCALC, TRIG, CHOLHDL, LDLDIRECT in the last 72 hours.  Thyroid Function Tests No results for input(s): TSH, T4TOTAL, T3FREE,  THYROIDAB in the last 72 hours.  Invalid input(s): FREET3  Other results:   Imaging    No results found.   Medications:     Scheduled Medications:  amiodarone  200 mg Oral BID   aspirin  81 mg Oral Daily   Chlorhexidine Gluconate Cloth  6 each Topical Daily   dapagliflozin propanediol  10 mg Oral Daily   dextromethorphan-guaiFENesin  1 tablet Oral BID   digoxin  0.125 mg Oral Daily   feeding supplement  237 mL Oral TID BM   Living Better with Heart Failure Book   Does not apply Once   multivitamin with minerals  1 tablet Oral Daily   nicotine  21 mg Transdermal QHS   pantoprazole  40 mg Oral Daily   rosuvastatin  40 mg Oral Daily   sodium chloride flush  10-40 mL Intracatheter Q12H   sodium chloride flush  3 mL Intravenous Q12H   sodium chloride flush  3 mL Intravenous Q12H   spironolactone  25 mg Oral Daily   Thrombi-Pad  1 each Topical Once   torsemide  20 mg Oral Daily    Infusions:  sodium chloride     sodium chloride Stopped (01/31/21 2357)   ceFEPime (MAXIPIME) IV 2 g (02/11/21 0501)   heparin 1,450 Units/hr (02/11/21 0445)   lactated ringers 10 mL/hr at 02/09/21 1651   magnesium sulfate bolus IVPB     milrinone 0.375 mcg/kg/min (02/11/21 0443)    PRN Medications: sodium chloride, Place/Maintain arterial line **AND** sodium chloride, acetaminophen, magnesium hydroxide, ondansetron (ZOFRAN) IV, sodium chloride flush, sodium chloride flush    Assessment/Plan   1. Acute systolic CHF: Ischemic cardiomyopathy, symptoms worsened by severe MR.  I reviewed the echo done, showing EF 35-40%, severe hypokinesis inferior and inferolateral walls, moderate LV dilation with mild LVH, mild RV dilation with moderately decreased systolic function, restricted posterior mitral leaflet and calcified mitral valve with severe mitral regurgitation and at least mild mitral stenosis (mean gradient 8 mmHg), PASP 60. Per report, echo at [facility] showed EF 55-60% with severe MR in  10/22.  It was a technically difficult study per report.  It is possible that he had a new ACS event prior to admission with reported drop in EF though HS-TnI with mild elevation and no trend suggests that it was not immediately prior to admission.  Low output noted with co-ox 39% initially, lactate elevated when he was initially admitted.   Currently on milrinone 0.375 with co-ox 54%.   - Continue digoxin 0.125 daily.  - Continue torsemide 20 mg daily  - Continue spironolactone 25 mg daily.  - Continue Farxiga 10 mg daily.  2. CAD: S/p CABG 2006.  As above, with fall in EF and CHF exacerbation, possible ACS prior to admission.  However, mild HS-TnI elevation with no trend suggests that ACS was  not immediately prior to admission.  Current elevation is likely demand ischemia from volume overload. LHC on 11/14 showed patent LIMA-LAD with SVG occluded at aorta (does not look new); there was complex 80% proximal ramus stenosis.  Initially assumed that the SVG was to the ramus, but was subsequently got the CABG operative report from [place] and it looks like it was an SVG-PLV.  There was minimal native RCA disease. LAD territory well-supplied by LIMA. No chest pain.   - Continue heparin gtt while off Xarelto.  - ASA 81 - Crestor 40 - Will need SVG-ramus with valve surgery.   3.  Mitral valve disease: Initial echo showed posterior MV leaflet restricted with severe MR, suspected primarily infarct-related MR given LV dilation and inferior/inferolateral severe hypokinesis.  However, TEE on 11/16 showed vegetation (not bulky but clearly present) on the posterior and anterior leaflets with poor leaflet coaptation, suggesting endocarditis may be the major cause of severe MR.  Reviewed with ID, suspect the mitral vegetations have been present for a while (severe MR on 10/22 echo as well, does not appear to have active infection).  - Seen by Dr. Cyndia Bent, will need mitral valve replacement. Currently scheduled for  12/1  - He has had dental surgery to remove decayed teeth pre-op.   4. 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  ?due to sedentary lifestyle + ?genetic predisposition.   - Was on Xarelto PTA, now heparin gtt.  Restart Xarelto prior to discharge.  5. Smoking: Discussed cessation.  6. PNA: Cultures 11/18 NGTD.  RML PNA on CT chest.  - He completed course of abx initially for PNA.  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 He is able to walk on the stump.  - PT following 8. 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  There is also mobile vegetation that appears adherent to plaque in the proximal descending thoracic aorta.  CT head showed no evidence for embolic disease.  Possible source would be due to poor dentition. Blood cultures from 11/10 and 11/16 NGTD.  He was initially on abx for PNA.  We re-drew cultures on 11/16.  ESR is 1, CRP 1.7.   WBCs trending down. Afebrile.  He had been already partially treated when we found the endocarditis (abx for PNA) and suspect that the endocarditis is old as he had severe MR also on echo in 10/22. ID following.  - Blood cultures drawn 11/18 -> Negative.  - Blood cultures redrawn 11/24 - NGTD - Per ID, continue 6 wks of IV abx to 12/27 =>cefepime and starting daptomycin today.  9. Microcytic anemia - Iron stores low, had Feraheme   As he is now milrinone-dependent and lives alone, looks like we will have to keep him in hospital until 12/1 surgery. If [facility] has any cancellations or reschedules, he is ready to go earlier.  He will need SNF after discharge.   Length of Stay: [place], NP  02/11/2021, 10:04 AM  Advanced Heart Failure Team Pager [phone] (M-F; 7a - 5p)  Please contact [facility] for night-coverage after hours (5p -7a ) and weekends on [email or web address]    Patient seen and examined with the above-signed Advanced Practice Provider and/or Housestaff. I personally reviewed  laboratory data, imaging studies and relevant notes. I independently examined the patient and formulated the important aspects of the plan. I have edited the note to reflect any of my changes or salient points. I have personally  discussed the plan with the patient and/or family.  Remains on milrinone 0.375. Co-ox marginal 54%  CVP 5. Denies CP or SOB. Abx changed by ID. Dr. Audie Box has raised the possibility on NBTE.   General:  Lying in bed. No resp difficulty HEENT: normal Neck: supple. no JVD. Carotids 2+ bilat; no bruits. No lymphadenopathy or thryomegaly appreciated. Cor: PMI nondisplaced. Regular rate & rhythm. 2/6 MR Lungs: clear Abdomen: soft, nontender, nondistended. No hepatosplenomegaly. No bruits or masses. Good bowel sounds. Extremities: no cyanosis, clubbing, rash, edema Neuro: alert & orientedx3, cranial nerves grossly intact. moves all 4 extremities w/o difficulty. Affect pleasant  Tenuous but stable on current regimen. Volume status ok. Continue milrinone. Possibliity of NBTE raised. Will d/w Dr. Aundra Dubin. ID managing abx. MVR/CABG planned for this week.  Glori Bickers, MD  12:00 PM

## 2021-02-11 NOTE — Progress Notes (Signed)
Pharmacy Antibiotic Note  Mark Garrett is a 40 y.o. male admitted on 01/27/2021 with acute exacerbation of heart failure now with suspected culture negative endocarditis.  Pharmacy has been consulted to restart cefepime and vancomycin after persistent leukocytosis in the setting of possible culture negative endocarditis.  Vanc has changed to daptomycin along with cefepime for culture neg endocarditis.   Plan: Daptomycin 8mg /kg/day Baseline CK then weekly Cefepime 2 gm IV every 8 hours Obtain vancomycin levels as indicated  Temp (24hrs), Avg:98.2 F (36.8 C), Min:96.8 F (36 C), Max:98.9 F (37.2 C)  Recent Labs  Lab 02/07/21 0441 02/08/21 0616 02/09/21 0500 02/10/21 0459 02/11/21 0500  WBC 13.3* 14.9* 16.1* 31.9* 21.2*  CREATININE 1.15 1.34* 1.29* 1.32* 1.37*     Estimated Creatinine Clearance: 68.7 mL/min (A) (by C-G formula based on SCr of 1.37 mg/dL (H)).    Allergies  Allergen Reactions   Iodide Rash    Antimicrobials this admission: Vancomycin 11/16 >>11/17, 11/20>>11/24 Daptomycin 11/25>> Cefepime 11/16 >>11/17, 11/20>>  Augmentin 11/11 > 11/15 (CAP treatment) Ceftriaxone, Azithromycin 11/10 x1   Dose adjustments this admission: none  Microbiology results: 11/24 Bcx: ngtd 11/19 fungus BCx: ngtd 11/19 TB Ab - ip 11/16 BCx: ngtdF 11/10 BCx: NGF 11/14 MRSA PCR: negative  12/14, PharmD, BCIDP, AAHIVP, CPP Infectious Disease Pharmacist 02/11/2021 12:50 PM

## 2021-02-11 NOTE — Progress Notes (Signed)
Subjective: No new complaints   Antibiotics:  Anti-infectives (From admission, onward)    Start     Dose/Rate Route Frequency Ordered Stop   02/11/21 1230  DAPTOmycin (CUBICIN) 550 mg in sodium chloride 0.9 % IVPB        550 mg 122 mL/hr over 30 Minutes Intravenous Daily 02/11/21 1148     02/06/21 2100  vancomycin (VANCOREADY) IVPB 1000 mg/200 mL  Status:  Discontinued       See Hyperspace for full Linked Orders Report.   1,000 mg 200 mL/hr over 60 Minutes Intravenous Every 12 hours 02/06/21 0802 02/10/21 1031   02/06/21 0900  vancomycin (VANCOREADY) IVPB 1500 mg/300 mL       See Hyperspace for full Linked Orders Report.   1,500 mg 150 mL/hr over 120 Minutes Intravenous Every 12 hours 02/06/21 0802 02/06/21 1215   02/06/21 0900  ceFEPIme (MAXIPIME) 2 g in sodium chloride 0.9 % 100 mL IVPB        2 g 200 mL/hr over 30 Minutes Intravenous Every 8 hours 02/06/21 0802     02/03/21 1600  vancomycin (VANCOREADY) IVPB 1750 mg/350 mL  Status:  Discontinued        1,750 mg 175 mL/hr over 120 Minutes Intravenous Every 24 hours 02/02/21 1541 02/02/21 1659   02/03/21 1600  vancomycin (VANCOREADY) IVPB 1000 mg/200 mL  Status:  Discontinued        1,000 mg 200 mL/hr over 60 Minutes Intravenous Every 24 hours 02/02/21 1659 02/04/21 0821   02/02/21 1630  ceFEPIme (MAXIPIME) 2 g in sodium chloride 0.9 % 100 mL IVPB  Status:  Discontinued        2 g 200 mL/hr over 30 Minutes Intravenous Every 8 hours 02/02/21 1535 02/04/21 0821   02/02/21 1630  vancomycin (VANCOREADY) IVPB 1500 mg/300 mL        1,500 mg 150 mL/hr over 120 Minutes Intravenous  Once 02/02/21 1535 02/02/21 1932   01/28/21 1700  cefTRIAXone (ROCEPHIN) 1 g in sodium chloride 0.9 % 100 mL IVPB  Status:  Discontinued        1 g 200 mL/hr over 30 Minutes Intravenous Every 24 hours 01/27/21 2119 01/28/21 1213   01/28/21 1300  amoxicillin-clavulanate (AUGMENTIN) 875-125 MG per tablet 1 tablet        1 tablet Oral Every 12  hours 01/28/21 1213 02/01/21 2116   01/27/21 1730  cefTRIAXone (ROCEPHIN) 1 g in sodium chloride 0.9 % 100 mL IVPB        1 g 200 mL/hr over 30 Minutes Intravenous  Once 01/27/21 1725 01/27/21 1831   01/27/21 1730  azithromycin (ZITHROMAX) 500 mg in sodium chloride 0.9 % 250 mL IVPB  Status:  Discontinued        500 mg 250 mL/hr over 60 Minutes Intravenous Every 24 hours 01/27/21 1725 01/28/21 1213       Medications: Scheduled Meds:  amiodarone  200 mg Oral BID   aspirin  81 mg Oral Daily   Chlorhexidine Gluconate Cloth  6 each Topical Daily   dapagliflozin propanediol  10 mg Oral Daily   dextromethorphan-guaiFENesin  1 tablet Oral BID   digoxin  0.125 mg Oral Daily   feeding supplement  237 mL Oral TID BM   Living Better with Heart Failure Book   Does not apply Once   multivitamin with minerals  1 tablet Oral Daily   nicotine  21 mg Transdermal QHS   pantoprazole  40 mg Oral Daily   rosuvastatin  40 mg Oral Daily   sodium chloride flush  10-40 mL Intracatheter Q12H   sodium chloride flush  3 mL Intravenous Q12H   sodium chloride flush  3 mL Intravenous Q12H   spironolactone  25 mg Oral Daily   Thrombi-Pad  1 each Topical Once   torsemide  20 mg Oral Daily   Continuous Infusions:  sodium chloride     sodium chloride Stopped (01/31/21 2357)   ceFEPime (MAXIPIME) IV 2 g (02/11/21 1340)   DAPTOmycin (CUBICIN)  IV     heparin 1,450 Units/hr (02/11/21 0445)   lactated ringers 10 mL/hr at 02/09/21 1651   magnesium sulfate bolus IVPB     milrinone 0.375 mcg/kg/min (02/11/21 0443)   PRN Meds:.sodium chloride, Place/Maintain arterial line **AND** sodium chloride, acetaminophen, magnesium hydroxide, ondansetron (ZOFRAN) IV, sodium chloride flush, sodium chloride flush    Objective: Weight change: -1.617 kg  Intake/Output Summary (Last 24 hours) at 02/11/2021 1435 Last data filed at 02/11/2021 1100 Gross per 24 hour  Intake --  Output 2505 ml  Net -2505 ml    Blood  pressure 115/78, pulse 81, temperature (!) 96.8 F (36 C), temperature source Axillary, resp. rate 18, height 5\' 11"  (1.803 m), weight 69.1 kg, SpO2 100 %. Temp:  [96.8 F (36 C)-98.9 F (37.2 C)] 96.8 F (36 C) (11/25 0819) Pulse Rate:  [75-81] 81 (11/25 0819) Resp:  [15-18] 18 (11/25 0819) BP: (106-128)/(72-84) 115/78 (11/25 1054) SpO2:  [97 %-100 %] 100 % (11/25 1054) Weight:  [69.1 kg] 69.1 kg (11/25 0400)  Physical Exam: Physical Exam Constitutional:      Appearance: He is well-developed.  HENT:     Head: Normocephalic and atraumatic.     Comments: Multiple teeth have been extracted Eyes:     Conjunctiva/sclera: Conjunctivae normal.  Cardiovascular:     Rate and Rhythm: Normal rate and regular rhythm.     Heart sounds: Murmur heard.  Pulmonary:     Effort: Pulmonary effort is normal. No respiratory distress.     Breath sounds: Normal breath sounds. No stridor. No wheezing or rhonchi.  Abdominal:     General: There is no distension.     Palpations: Abdomen is soft.  Musculoskeletal:        General: Normal range of motion.     Cervical back: Normal range of motion and neck supple.  Skin:    General: Skin is warm and dry.     Findings: No erythema or rash.  Neurological:     General: No focal deficit present.     Mental Status: He is alert and oriented to person, place, and time.  Psychiatric:        Mood and Affect: Mood normal.        Behavior: Behavior normal.        Thought Content: Thought content normal.        Judgment: Judgment normal.    Right foot without any ulcerations or lesions  Left Side amputation site is clean and without any evidence of infection  Central line and PIV without evidence of infection CBC:    BMET Recent Labs    02/10/21 0459 02/11/21 0500  NA 131* 129*  K 4.4 4.0  CL 93* 92*  CO2 27 27  GLUCOSE 113* 126*  BUN 25* 27*  CREATININE 1.32* 1.37*  CALCIUM 9.4 9.0      Liver Panel  No results for input(s): PROT,  ALBUMIN, AST,  ALT, ALKPHOS, BILITOT, BILIDIR, IBILI in the last 72 hours.     Sedimentation Rate No results for input(s): ESRSEDRATE in the last 72 hours. C-Reactive Protein No results for input(s): CRP in the last 72 hours.  Micro Results: Recent Results (from the past 720 hour(s))  Resp Panel by RT-PCR (Flu A&B, Covid) Nasopharyngeal Swab     Status: None   Collection Time: 01/27/21 10:09 AM   Specimen: Nasopharyngeal Swab; Nasopharyngeal(NP) swabs in vial transport medium  Result Value Ref Range Status   SARS Coronavirus 2 by RT PCR NEGATIVE NEGATIVE Final    Comment: (NOTE) SARS-CoV-2 target nucleic acids are NOT DETECTED.  The SARS-CoV-2 RNA is generally detectable in upper respiratory specimens during the acute phase of infection. The lowest concentration of SARS-CoV-2 viral copies this assay can detect is 138 copies/mL. A negative result does not preclude SARS-Cov-2 infection and should not be used as the sole basis for treatment or other patient management decisions. A negative result may occur with  improper specimen collection/handling, submission of specimen other than nasopharyngeal swab, presence of viral mutation(s) within the areas targeted by this assay, and inadequate number of viral copies(<138 copies/mL). A negative result must be combined with clinical observations, patient history, and epidemiological information. The expected result is Negative.  Fact Sheet for Patients:  EntrepreneurPulse.com.au  Fact Sheet for Healthcare Providers:  IncredibleEmployment.be  This test is no t yet approved or cleared by the [place] FDA and  has been authorized for detection and/or diagnosis of SARS-CoV-2 by FDA under an Emergency Use Authorization (EUA). This EUA will remain  in effect (meaning this test can be used) for the duration of the COVID-19 declaration under Section 564(b)(1) of the Act, 21 U.S.C.section  360bbb-3(b)(1), unless the authorization is terminated  or revoked sooner.       Influenza A by PCR NEGATIVE NEGATIVE Final   Influenza B by PCR NEGATIVE NEGATIVE Final    Comment: (NOTE) The Xpert Xpress SARS-CoV-2/FLU/RSV plus assay is intended as an aid in the diagnosis of influenza from Nasopharyngeal swab specimens and should not be used as a sole basis for treatment. Nasal washings and aspirates are unacceptable for Xpert Xpress SARS-CoV-2/FLU/RSV testing.  Fact Sheet for Patients: EntrepreneurPulse.com.au  Fact Sheet for Healthcare Providers: IncredibleEmployment.be  This test is not yet approved or cleared by the [place] FDA and has been authorized for detection and/or diagnosis of SARS-CoV-2 by FDA under an Emergency Use Authorization (EUA). This EUA will remain in effect (meaning this test can be used) for the duration of the COVID-19 declaration under Section 564(b)(1) of the Act, 21 U.S.C. section 360bbb-3(b)(1), unless the authorization is terminated or revoked.  Performed at [facility], [place] [address] [place], [place] [address]   C Difficile Quick Screen w PCR reflex     Status: None   Collection Time: 01/27/21  8:47 PM   Specimen: STOOL  Result Value Ref Range Status   C Diff antigen NEGATIVE NEGATIVE Final   C Diff toxin NEGATIVE NEGATIVE Final   C Diff interpretation No C. difficile detected.  Final    Comment: Performed at [facility], [place] [address] [place], [place] [address]  Culture, blood (Routine X 2) w Reflex to ID Panel     Status: None   Collection Time: 01/27/21 10:59 PM   Specimen: BLOOD  Result Value Ref Range Status   Specimen Description BLOOD LEFT ANTECUBITAL  Final   Special Requests   Final  BOTTLES DRAWN AEROBIC AND ANAEROBIC Blood Culture adequate volume   Culture   Final    NO GROWTH 5 DAYS Performed at [facility], [place] [address] [place], [place] [address]     Report Status 02/01/2021 FINAL  Final  Culture, blood (Routine X 2) w Reflex to ID Panel     Status: None   Collection Time: 01/27/21 10:59 PM   Specimen: BLOOD LEFT HAND  Result Value Ref Range Status   Specimen Description BLOOD LEFT HAND  Final   Special Requests   Final    BOTTLES DRAWN AEROBIC AND ANAEROBIC Blood Culture adequate volume   Culture   Final    NO GROWTH 5 DAYS Performed at [facility], [place] [address], [place], [place] [address]    Report Status 02/01/2021 FINAL  Final  MRSA Next Gen by PCR, Nasal     Status: None   Collection Time: 01/31/21  7:04 PM   Specimen: Nasal Mucosa; Nasal Swab  Result Value Ref Range Status   MRSA by PCR Next Gen NOT DETECTED NOT DETECTED Final    Comment: (NOTE) The GeneXpert MRSA Assay (FDA approved for NASAL specimens only), is one component of a comprehensive MRSA colonization surveillance program. It is not intended to diagnose MRSA infection nor to guide or monitor treatment for MRSA infections. Test performance is not FDA approved in patients less than 33 years old. Performed at [facility], [place] [address], [place], [place] [address]   Culture, blood (routine x 2)     Status: None   Collection Time: 02/02/21  2:34 PM   Specimen: BLOOD  Result Value Ref Range Status   Specimen Description BLOOD RIGHT ANTECUBITAL  Final   Special Requests   Final    BOTTLES DRAWN AEROBIC AND ANAEROBIC Blood Culture adequate volume   Culture   Final    NO GROWTH 5 DAYS Performed at [facility], [place] [address], [place], [place] [address]    Report Status 02/07/2021 FINAL  Final  Culture, blood (routine x 2)     Status: None   Collection Time: 02/02/21  2:34 PM   Specimen: BLOOD RIGHT HAND  Result Value Ref Range Status   Specimen Description BLOOD RIGHT HAND  Final   Special Requests   Final    BOTTLES DRAWN AEROBIC AND ANAEROBIC Blood Culture adequate volume   Culture   Final    NO GROWTH 5 DAYS Performed at [facility], [place] [address] [place], [place] [address]    Report Status 02/07/2021 FINAL  Final  Culture, blood (routine x 2)     Status: None   Collection Time: 02/04/21  8:05 AM   Specimen: BLOOD  Result Value Ref Range Status   Specimen Description BLOOD RIGHT ANTECUBITAL  Final   Special Requests   Final    BOTTLES DRAWN AEROBIC AND ANAEROBIC Blood Culture adequate volume   Culture   Final    NO GROWTH 5 DAYS Performed at [facility], [place] [address] [place], [place] [address]    Report Status 02/09/2021 FINAL  Final  Culture, blood (routine x 2)     Status: None   Collection Time: 02/04/21  8:05 AM   Specimen: BLOOD  Result Value Ref Range Status   Specimen Description BLOOD BLOOD RIGHT HAND  Final   Special Requests AEROBIC BOTTLE ONLY Blood Culture adequate volume  Final   Culture   Final  NO GROWTH 5 DAYS Performed at [facility], [place] [address] [place], [place] [address]    Report Status 02/09/2021 FINAL  Final  Fungus culture, blood     Status: None (Preliminary result)   Collection Time: 02/05/21 10:41 AM   Specimen: BLOOD  Result Value Ref Range Status   Specimen Description BLOOD RIGHT ANTECUBITAL  Final   Special Requests   Final    BOTTLES DRAWN AEROBIC AND ANAEROBIC Blood Culture adequate volume   Culture   Final    NO GROWTH 5 DAYS Performed at [facility], [place] [address], [place], [place] [address]    Report Status PENDING  Incomplete  Fungus culture, blood     Status: None (Preliminary result)   Collection Time: 02/05/21 10:46 AM   Specimen: BLOOD  Result Value Ref Range Status   Specimen Description BLOOD RIGHT ANTECUBITAL  Final   Special Requests   Final    BOTTLES DRAWN AEROBIC AND ANAEROBIC Blood Culture adequate volume   Culture   Final    NO GROWTH 5 DAYS Performed at [facility], [place] [address], [place], [place] [address]    Report Status PENDING  Incomplete  Acid Fast Smear (AFB)     Status: None    Collection Time: 02/05/21 12:08 PM   Specimen: Vein; Blood  Result Value Ref Range Status   AFB Specimen Processing Concentration  Final    Comment: (NOTE) Performed At: [facility] [place], [place] [id number] Rush Farmer MD [id number]    Acid Fast Smear QNSAFB  Final    Comment: (NOTE) Test not performed. AFB Smear not performed due to specimen source (blood) or insufficient specimen.    Source (AFB) 2 GREEN SODIUM HEP  Final    Comment: Performed at [facility], [place] [address], [place], [place] [address]  Acid Fast Smear (AFB)     Status: None   Collection Time: 02/05/21 12:08 PM   Specimen: Vein; Blood  Result Value Ref Range Status   AFB Specimen Processing Concentration  Final    Comment: (NOTE) Performed At: [facility] [place], [place] [id number] Rush Farmer MD [id number]    Acid Fast Smear QNSAFB  Final    Comment: (NOTE) Test not performed. AFB Smear not performed due to specimen source (blood) or insufficient specimen.    Source (AFB) 2 GREEN SODIUM HEP  Corrected    Comment: Performed at [facility], [place] [address], [place], [place] [address] CORRECTED ON 11/21 AT 0946: PREVIOUSLY REPORTED AS 4 GREEN SODIUM HEP   Culture, blood (Routine X 2) w Reflex to ID Panel     Status: None (Preliminary result)   Collection Time: 02/10/21  9:42 AM   Specimen: BLOOD  Result Value Ref Range Status   Specimen Description BLOOD RIGHT ANTECUBITAL  Final   Special Requests   Final    BOTTLES DRAWN AEROBIC AND ANAEROBIC Blood Culture adequate volume   Culture   Final    NO GROWTH < 12 HOURS Performed at [facility], [place] [address], [place],  [address]    Report Status PENDING  Incomplete  Culture, blood (Routine X 2) w Reflex to ID Panel     Status: None (Preliminary result)   Collection Time: 02/10/21  9:47 AM   Specimen: BLOOD  Result Value Ref Range Status   Specimen Description BLOOD  LEFT ANTECUBITAL  Final   Special  Requests   Final    BOTTLES DRAWN AEROBIC AND ANAEROBIC Blood Culture adequate volume   Culture   Final    NO GROWTH < 12 HOURS Performed at [facility], [place] [address], [place], [place] [address]    Report Status PENDING  Incomplete    Studies/Results: No results found.    Assessment/Plan:  INTERVAL HISTORY: Patient did not get CT yesterday due to his contrast allergy  Principal Problem:   Acute systolic CHF (congestive heart failure) (HCC) Active Problems:   CAP (community acquired pneumonia)   Pleural effusion on right   Leukocytosis   Microcytic anemia   NSTEMI (non-ST elevated myocardial infarction) (HCC)   DVT (deep venous thrombosis) (HCC)   Cigarette smoker   COPD (chronic obstructive pulmonary disease) (HCC)   Asthma   Severe mitral regurgitation   Serum total bilirubin elevated   Paroxysmal VT   Cardiogenic shock ([place])   Endocarditis of mitral valve   Long term current use of anticoagulant therapy   Encounter for preoperative dental examination   [place]   Retained tooth root   Chronic apical periodontitis   Accretions on teeth   Teeth missing   Excessive dental attrition   Chronic periodontitis   Protein-calorie malnutrition, severe   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HCC)    Mark Garrett is a 40 y.o. male with coronary artery disease status post coronary bypass grafting DVT who was initially admitted for shortness of breath.  Chest x-ray had shown some patchy opacity in the right lung with CT showing a right middle lobe and small bilateral lobe opacities.  Patient has been on ceftriaxone and azithromycin and then changed over to Augmentin.  Bilateral lower extremity Doppler had shown an indeterminate DVT.  He then underwent TEE which showedvegetation on the anterior and posterior mitral valve leaflets on transesophageal echocardiogram with severe mitral regurgitation.  His blood cultures that were  taken on admission on 10 November failed to grow any organism.  He has had repeat blood cultures on antibiotics on the 16th and was changed to vancomycin and cefepime.  Repeat blood cultures on antibiotics failed to yield an organism.  We formally saw the patient on the 16th (Greg Calone and Laurice Record, MD) and had recommended observing him off antibiotics with a low threshold for restarting them.  We did Bartonella antibodies which were all negative as well as Brucella antibodies and Legionella antibodies again which were negative.  Patient did have an ANA which was negative.  He was restarted back on vancomycin and cefepime on the 18th and has been on these antibiotics since then.  He has had problems with cardiogenic shock requiring milrinone.  He is awaiting mitral valve surgery on 1 December.  His white blood cell count is now climbed again to 31900  from 16,000  and I was asked to come see the patient again.  Note his white blood cell count was as high as 3900 on 12 November and he has not had a normal white count for the majority of his admission.  Also potentially of signficance the pateint had oral surgery on the 22nd just 2 days ago with extractions of teeth numbers [id number] and [id number] and alveoloplasty.  It is certainly possible that his leukocytosis could be a postoperative reaction to surgery.  He has no focal symptoms at all and he is afebrile.  I otherwise did not have a clear target on his exam to explain why his white  count has gone back up again.   I ordered a CT of the chest abdomen pelvis yesterday but he could not have the studies with contrast without premedication with prednisone so the studies were canceled.  My understanding was the studies were going to be rescheduled with a pretty medication regimen of prednisone.  This has not been done but interestingly in the interim his white blood cell count is come back down to 21,200.  I think with further  reflection it is not worth continuing to expose the patient to high doses of radiation with CT scans.  Her multiple risk nodules of radiation but potentially of contrast nephropathy as well.   #1 Leukocytosis:   Most recent jump in white blood cell count certainly could have been due to his recent extraction of multiple teeth.  I think we (and myself included) should restrain ourselves w re to imaging in response to his WBC  #3 Endocarditis of MV: from reading Dr. 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  I spent 36 minutes with the patient including face to face counseling of the patient regarding his white blood cell count his endocarditis, work-up done for both of these personally reviewing chest x-ray CBC CMP along with data review of medical records before and during the visit and in coordination of his care.   I will only follow him peripherally at this point.  When he has CT surgery please send his valve for cultures and also to path (and we can arrange to have PCRs sent to [facility]  I think a 6 week postoperative course of daptomycin and ceftriaxone would be reasonable but the choice of antibiotics may very well depend on placement.       LOS: 15 days   Alcide Evener 02/11/2021, 2:35 PM

## 2021-02-11 NOTE — Progress Notes (Signed)
Occupational Therapy Treatment Patient Details Name: Mark Garrett MRN: [id number] DOB: 1981/01/21 Today's Date: 02/11/2021   History of present illness Pt is a 40 y.o. male admitted 01/27/21 due to CHF exacerbation. S/p R/L heart cath and coronary graft angiography on 11/14. 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 Will need MVR, tentatively scheduled for 12/1. PMH includes CHF, CAD (s/p CABG 2006), mitral valve disease, traumatic amputation of L foot.   OT comments  Pt is able to complete ADLs with supervision.  He performed theraband HEP sets x 10 reps each.  Ambulates with supervision.  Continued to reinforce sternal precautions in prep for upcoming surgery.    Recommendations for follow up therapy are one component of a multi-disciplinary discharge planning process, led by the attending physician.  Recommendations may be updated based on patient status, additional functional criteria and insurance authorization.    Follow Up Recommendations  No OT follow up    Assistance Recommended at Discharge PRN  Equipment Recommendations  None recommended by OT    Recommendations for Other Services      Precautions / Restrictions Precautions Precautions: Fall;Other (comment) Precaution Comments: h/o L forefoot amputation (does not have prosthetic in room; ambulates with/without it at home); initiated educ on sternal precautions in preparation for likely MVR Restrictions Weight Bearing Restrictions: No LLE Weight Bearing: Non weight bearing       Mobility Bed Mobility Overal bed mobility: Needs Assistance Bed Mobility: Supine to Sit;Sit to Supine     Supine to sit: Supervision Sit to supine: Supervision        Transfers                         Balance Overall balance assessment: Needs assistance Sitting-balance support: No upper extremity supported;Feet supported Sitting balance-Leahy Scale: Normal     Standing  balance support: No upper extremity supported;During functional activity Standing balance-Leahy Scale: Good                             ADL either performed or assessed with clinical judgement   ADL Overall ADL's : Needs assistance/impaired             Lower Body Bathing: Supervison/ safety;Sit to/from stand       Lower Body Dressing: Supervision/safety;Sit to/from stand   Toilet Transfer: Supervision/safety;Ambulation;Comfort height toilet   Toileting- Clothing Manipulation and Hygiene: Supervision/safety;Sit to/from stand       Functional mobility during ADLs: Supervision/safety      Extremity/Trunk Assessment Upper Extremity Assessment Upper Extremity Assessment: Generalized weakness   Lower Extremity Assessment Lower Extremity Assessment: Defer to PT evaluation        Vision       Perception     Praxis      Cognition Arousal/Alertness: Awake/alert Behavior During Therapy: WFL for tasks assessed/performed Overall Cognitive Status: Within Functional Limits for tasks assessed                                            Exercises Exercises: Other exercises General Exercises - Upper Extremity Shoulder Flexion: AROM;Strengthening;Both;Seated;Theraband;20 reps Theraband Level (Shoulder Flexion): Level 3 (Green) Shoulder Horizontal ABduction: AROM;Strengthening;Both;Seated;Theraband;20 reps Theraband Level (Shoulder Horizontal Abduction): Level 3 (Green) Elbow Flexion: AROM;Strengthening;Both;Seated;Theraband;20 reps Theraband Level (Elbow Flexion): Level 3 (Green)  Elbow Extension: AROM;Strengthening;Both;Seated;Theraband;20 reps Theraband Level (Elbow Extension): Level 3 (Green) Other Exercises Other Exercises: diagonals with green theraband 2 sets 10 reps each side   Shoulder Instructions       General Comments      Pertinent Vitals/ Pain       Pain Assessment: No/denies pain  Home Living                                           Prior Functioning/Environment              Frequency  Min 2X/week        Progress Toward Goals  OT Goals(current goals can now be found in the care plan section)  Progress towards OT goals: Progressing toward goals     Plan Discharge plan remains appropriate    Co-evaluation                 AM-PAC OT "6 Clicks" Daily Activity     Outcome Measure   Help from another person eating meals?: None Help from another person taking care of personal grooming?: A Little Help from another person toileting, which includes using toliet, bedpan, or urinal?: A Little Help from another person bathing (including washing, rinsing, drying)?: A Little Help from another person to put on and taking off regular upper body clothing?: None Help from another person to put on and taking off regular lower body clothing?: A Little 6 Click Score: 20    End of Session    OT Visit Diagnosis: Unsteadiness on feet (R26.81);Other abnormalities of gait and mobility (R26.89)   Activity Tolerance Patient tolerated treatment well   Patient Left in bed;with call bell/phone within reach   Nurse Communication Mobility status        Time: 1610-9604 OT Time Calculation (min): 23 min  Charges: OT General Charges $OT Visit: 1 Visit OT Treatments $Therapeutic Activity: 8-22 mins $Therapeutic Exercise: 8-22 mins  Eber Jones., OTR/L Acute Rehabilitation Services Pager [phone] Office [phone]   Jeani Hawking M 02/11/2021, 11:31 AM

## 2021-02-12 LAB — CBC
HCT: 37.3 % — ABNORMAL LOW (ref 39.0–52.0)
Hemoglobin: 10.9 g/dL — ABNORMAL LOW (ref 13.0–17.0)
MCH: 21.9 pg — ABNORMAL LOW (ref 26.0–34.0)
MCHC: 29.2 g/dL — ABNORMAL LOW (ref 30.0–36.0)
MCV: 75.1 fL — ABNORMAL LOW (ref 80.0–100.0)
Platelets: 170 10*3/uL (ref 150–400)
RBC: 4.97 MIL/uL (ref 4.22–5.81)
RDW: 29.8 % — ABNORMAL HIGH (ref 11.5–15.5)
WBC: 17 10*3/uL — ABNORMAL HIGH (ref 4.0–10.5)
nRBC: 0.1 % (ref 0.0–0.2)

## 2021-02-12 LAB — FUNGUS CULTURE, BLOOD
Culture: NO GROWTH
Culture: NO GROWTH
Special Requests: ADEQUATE
Special Requests: ADEQUATE

## 2021-02-12 LAB — BASIC METABOLIC PANEL
Anion gap: 10 (ref 5–15)
BUN: 29 mg/dL — ABNORMAL HIGH (ref 6–20)
CO2: 30 mmol/L (ref 22–32)
Calcium: 9.4 mg/dL (ref 8.9–10.3)
Chloride: 92 mmol/L — ABNORMAL LOW (ref 98–111)
Creatinine, Ser: 1.33 mg/dL — ABNORMAL HIGH (ref 0.61–1.24)
GFR, Estimated: >60 mL/min (ref >60)
Glucose, Bld: 118 mg/dL — ABNORMAL HIGH (ref 70–99)
Potassium: 4 mmol/L (ref 3.5–5.1)
Sodium: 132 mmol/L — ABNORMAL LOW (ref 135–145)

## 2021-02-12 LAB — QUANTIFERON-TB GOLD PLUS (RQFGPL)
QuantiFERON Mitogen Value: 0.95 IU/mL
QuantiFERON Nil Value: 0 IU/mL
QuantiFERON TB1 Ag Value: 0 IU/mL
QuantiFERON TB2 Ag Value: 0 IU/mL

## 2021-02-12 LAB — HEPARIN LEVEL (UNFRACTIONATED): Heparin Unfractionated: 0.4 IU/mL (ref 0.30–0.70)

## 2021-02-12 LAB — COOXEMETRY PANEL
Carboxyhemoglobin: 2 % — ABNORMAL HIGH (ref 0.5–1.5)
Methemoglobin: 0.7 % (ref 0.0–1.5)
O2 Saturation: 70.3 %
Total hemoglobin: 10.9 g/dL — ABNORMAL LOW (ref 12.0–16.0)

## 2021-02-12 LAB — QUANTIFERON-TB GOLD PLUS: QuantiFERON-TB Gold Plus: NEGATIVE

## 2021-02-12 LAB — MAGNESIUM: Magnesium: 2.3 mg/dL (ref 1.7–2.4)

## 2021-02-12 MED ORDER — TORSEMIDE 20 MG PO TABS
10.0000 mg | ORAL_TABLET | Freq: Every day | ORAL | Status: DC
  Administered 2021-02-13 – 2021-02-16 (×4): 10 mg via ORAL
  Filled 2021-02-12 (×4): qty 1

## 2021-02-12 NOTE — Progress Notes (Signed)
Patient ID: Mark Garrett, male   DOB: 1980-12-15, 40 y.o.   MRN: [id number]      Advanced Heart Failure Rounding Note  PCP-Cardiologist: None   Subjective:    Remains on milrinone 0.375 with co-ox 70%. CVP 4.    Remains in NSR on po amiodarone.   Teeth out for surgery.   No complaints this morning, has walked in the hall.   TEE (11/16): EF 35-40%, there was mobile vegetation on both the anterior and posterior mitral leaflets.  The leaflets did not completely coapt (?destruction from endocarditis) with severe mitral regurgitation (ERO 0.56 cm^2).  There was extensive plaque in the proximal descending thoracic aorta with what appears to be mobile vegetation attached to it.   LHC/RHC:   Coronary Findings  Diagnostic Dominance: Right Left Anterior Descending  Mid LAD lesion is 95% stenosed.  Ramus Intermedius  Ramus-1 lesion is 50% stenosed.  Ramus-2 lesion is 80% stenosed.  Right Coronary Artery  Mid RCA lesion is 30% stenosed.  Graft To PLV  Origin lesion is 100% stenosed.  LIMA Graft To Dist LAD  Intervention  No interventions have been documented. Right Heart  Right Heart Pressures RHC Procedural Findings: Hemodynamics (mmHg) on milrinone 0.375 RA mean 18 RV 69/20 PA 76/25, mean 44 PCWP mean 22, v-waves to 37 mmHg LV 100/23 AO 100/60  Oxygen saturations: PA 58% AO 97%  Cardiac Output (Fick) 5.33  Cardiac Index (Fick) 2.67 PVR 4.1 WU  Cardiac Output (Thermo) 4.89 Cardiac Index (Thermo) 2.45  PVR 4.5 WU  PAPI: 2.8    Objective:   Weight Range: 69.2 kg Body mass index is 21.28 kg/m.   Vital Signs:   Temp:  [97.8 F (36.6 C)-98.5 F (36.9 C)] 98.4 F (36.9 C) (11/26 0919) Pulse Rate:  [73-78] 78 (11/26 0919) Resp:  [16-20] 16 (11/26 0919) BP: (115-121)/(75-83) 116/75 (11/26 0919) SpO2:  [96 %-100 %] 100 % (11/26 0919) Weight:  [69.2 kg] 69.2 kg (11/26 0410) Last BM Date: 02/11/21  Weight change: Filed Weights   02/10/21 0334 02/11/21  0400 02/12/21 0410  Weight: 70.7 kg 69.1 kg 69.2 kg    Intake/Output:   Intake/Output Summary (Last 24 hours) at 02/12/2021 1012 Last data filed at 02/12/2021 0830 Gross per 24 hour  Intake 2868 ml  Output 5275 ml  Net -2407 ml      Physical Exam   CVP 4  General: NAD Neck: No JVD, no thyromegaly or thyroid nodule.  Lungs: Clear to auscultation bilaterally with normal respiratory effort. CV: Nondisplaced PMI.  Heart regular S1/S2, no S3/S4, 3/6 HSM apex.  No peripheral edema.   Abdomen: Soft, nontender, no hepatosplenomegaly, no distention.  Skin: Intact without lesions or rashes.  Neurologic: Alert and oriented x 3.  Psych: Normal affect. Extremities: No clubbing or cyanosis. S/p left traumatic foot amputation.  HEENT: Normal.    Telemetry   SR 70-80s with occasional PVCs (personally reviewed)   Labs    CBC Recent Labs    02/11/21 0500 02/12/21 0500  WBC 21.2* 17.0*  HGB 10.4* 10.9*  HCT 36.4* 37.3*  MCV 74.1* 75.1*  PLT 174 962   Basic Metabolic Panel Recent Labs    02/11/21 0500 02/12/21 0500  NA 129* 132*  K 4.0 4.0  CL 92* 92*  CO2 27 30  GLUCOSE 126* 118*  BUN 27* 29*  CREATININE 1.37* 1.33*  CALCIUM 9.0 9.4  MG 2.2 2.3   Liver Function Tests No results for input(s): AST, ALT, ALKPHOS,  BILITOT, PROT, ALBUMIN in the last 72 hours.  No results for input(s): LIPASE, AMYLASE in the last 72 hours. Cardiac Enzymes Recent Labs    02/11/21 0511  CKTOTAL 9*    BNP: BNP (last 3 results) Recent Labs    01/27/21 1034  BNP 1,733.0*    ProBNP (last 3 results) No results for input(s): PROBNP in the last 8760 hours.   D-Dimer No results for input(s): DDIMER in the last 72 hours. Hemoglobin A1C No results for input(s): HGBA1C in the last 72 hours. Fasting Lipid Panel No results for input(s): CHOL, HDL, LDLCALC, TRIG, CHOLHDL, LDLDIRECT in the last 72 hours.  Thyroid Function Tests No results for input(s): TSH, T4TOTAL, T3FREE,  THYROIDAB in the last 72 hours.  Invalid input(s): FREET3  Other results:   Imaging    No results found.   Medications:     Scheduled Medications:  amiodarone  200 mg Oral BID   aspirin  81 mg Oral Daily   Chlorhexidine Gluconate Cloth  6 each Topical Daily   dapagliflozin propanediol  10 mg Oral Daily   dextromethorphan-guaiFENesin  1 tablet Oral BID   digoxin  0.125 mg Oral Daily   feeding supplement  237 mL Oral TID BM   Living Better with Heart Failure Book   Does not apply Once   multivitamin with minerals  1 tablet Oral Daily   nicotine  21 mg Transdermal QHS   pantoprazole  40 mg Oral Daily   rosuvastatin  40 mg Oral Daily   sodium chloride flush  10-40 mL Intracatheter Q12H   sodium chloride flush  3 mL Intravenous Q12H   sodium chloride flush  3 mL Intravenous Q12H   spironolactone  25 mg Oral Daily   Thrombi-Pad  1 each Topical Once   torsemide  20 mg Oral Daily    Infusions:  sodium chloride     sodium chloride Stopped (01/31/21 2357)   ceFEPime (MAXIPIME) IV 2 g (02/12/21 0509)   DAPTOmycin (CUBICIN)  IV 550 mg (02/11/21 1649)   heparin 1,450 Units/hr (02/11/21 2214)   lactated ringers 10 mL/hr at 02/11/21 1800   magnesium sulfate bolus IVPB     milrinone 0.375 mcg/kg/min (02/12/21 0222)    PRN Medications: sodium chloride, Place/Maintain arterial line **AND** sodium chloride, acetaminophen, magnesium hydroxide, ondansetron (ZOFRAN) IV, sodium chloride flush, sodium chloride flush    Assessment/Plan   1. Acute systolic CHF: Ischemic cardiomyopathy, symptoms worsened by severe MR.  I reviewed the echo done, showing EF 35-40%, severe hypokinesis inferior and inferolateral walls, moderate LV dilation with mild LVH, mild RV dilation with moderately decreased systolic function, restricted posterior mitral leaflet and calcified mitral valve with severe mitral regurgitation and at least mild mitral stenosis (mean gradient 8 mmHg), PASP 60. Per report, echo  at [facility] showed EF 55-60% with severe MR in 10/22.  It was a technically difficult study per report.  It is possible that he had a new ACS event prior to admission with reported drop in EF though HS-TnI with mild elevation and no trend suggests that it was not immediately prior to admission.  Low output noted with co-ox 39% initially, lactate elevated when he was initially admitted.  Currently on milrinone 0.375 with co-ox 70%, CVP 4.   - Continue digoxin 0.125 daily.  - Can decrease torsemide to 10 mg daily.  - Continue spironolactone 25 mg daily.  - Continue Farxiga 10 mg daily.  2. CAD: S/p CABG 2006.  As above, with  fall in EF and CHF exacerbation, possible ACS prior to admission.  However, mild HS-TnI elevation with no trend suggests that ACS was not immediately prior to admission.  Current elevation is likely demand ischemia from volume overload. LHC on 11/14 showed patent LIMA-LAD with SVG occluded at aorta (does not look new); there was complex 80% proximal ramus stenosis.  Initially assumed that the SVG was to the ramus, but was subsequently got the CABG operative report from [place] and it looks like it was an SVG-PLV.  There was minimal native RCA disease. LAD territory well-supplied by LIMA. No chest pain.   - Continue heparin gtt while off Xarelto.  - ASA 81 - Crestor 40 - Will need SVG-ramus with valve surgery.   3.  Mitral valve disease: Initial echo showed posterior MV leaflet restricted with severe MR, suspected primarily infarct-related MR given LV dilation and inferior/inferolateral severe hypokinesis.  However, TEE on 11/16 showed vegetation (not bulky but clearly present) on the posterior and anterior leaflets with poor leaflet coaptation, suggesting endocarditis may be the major cause of severe MR.  Reviewed with ID, suspect the mitral vegetations have been present for a while (severe MR on 10/22 echo as well, does not appear to have active infection).  Cannot rule out  nonbacterial thrombotic endocarditis (had recent DVT also), but somewhat academic at this point as he will be on anticoagulation for the non-triggered DVT and we are going to treat with antibiotic course given presentation and high WBCs. ANA negative.  - Seen by Dr. Cyndia Bent, will need mitral valve replacement. Currently scheduled for 12/1  - He has had dental surgery to remove decayed teeth pre-op.   4. 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  ?due to sedentary lifestyle + ?genetic predisposition.  He is not a Xarelto failure, had DVT found in 10/22 with Xarelto started at this point, 11/22 dopplers showed partially treated DVTs.  - Was on Xarelto PTA, now heparin gtt.  Restart Xarelto prior to discharge.  5. Smoking: Needs to stop.   6. PNA: Cultures 11/18 NGTD.  RML PNA on CT chest.  - He completed course of abx initially for PNA.  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 He is able to walk on the stump.  - PT following, continue to mobilize.  8. 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  There is also mobile vegetation that appears adherent to plaque in the proximal descending thoracic aorta.  CT head showed no evidence for embolic disease.  Possible source would be due to poor dentition. Blood cultures from 11/10 and 11/16 NGTD.  He was initially on abx for PNA.  We re-drew cultures on 11/16 and 11/24, all negative so far.  ESR is 1, CRP 1.7.  He had been already partially treated when we found the endocarditis (abx for PNA) and suspect that the endocarditis is old as he had severe MR also on echo in 10/22. ID following.  WBCs rose to >30 after teeth removed but now coming back down (17 today).  Suspect worsening leukocytosis was a response to dental surgery. Afebrile. ID has seen, adjusted abx.  - Per ID, continue 6 wks of IV abx to 12/27 =>cefepime and started daptomycin to replace vancomycin.   - Will need MV sent for tissue culture  when surgery done.   9. Microcytic anemia - Iron stores low, had Feraheme   As he is now milrinone-dependent and lives alone, looks like  we will have to keep him in hospital until 12/1 surgery. If [facility] has any cancellations or reschedules, he is ready to go earlier.  He will need SNF after discharge.   Length of Stay: [place], [place]  02/12/2021, 10:12 AM  Advanced Heart Failure Team Pager [phone] (M-F; 7a - 5p)  Please contact [facility] for night-coverage after hours (5p -7a ) and weekends on [email or web address]

## 2021-02-12 NOTE — Progress Notes (Signed)
ANTICOAGULATION CONSULT NOTE  Pharmacy Consult heparin Indication: pre-existing DVT  Allergies  Allergen Reactions   Iodide Rash    Patient Measurements: Height: 5\' 11"  (180.3 cm) Weight: 69.2 kg (152 lb 8.9 oz) IBW/kg (Calculated) : 75.3  Heparin Dosing Weight: 72.6 kg  Vital Signs: Temp: 98 F (36.7 C) (11/26 0410) Temp Source: Oral (11/26 0410) BP: 120/79 (11/26 0410) Pulse Rate: 76 (11/26 0410)  Labs: Recent Labs    02/10/21 0459 02/11/21 0500 02/11/21 0511 02/12/21 0500  HGB 10.6* 10.4*  --  10.9*  HCT 36.6* 36.4*  --  37.3*  PLT 191 174  --  170  HEPARINUNFRC 0.62 0.47  --  0.40  CREATININE 1.32* 1.37*  --  1.33*  CKTOTAL  --   --  9*  --     Estimated Creatinine Clearance: 70.8 mL/min (A) (by C-G formula based on SCr of 1.33 mg/dL (H)).  Assessment: 40 y.o. male with medical history significant for asthma/COPD, recent DVT, tobacco abuse who presented with 3 days SOB and CP. CT showed no evidence of pulmonary embolism. 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 Pharmacy consulted to start heparin infusion.   Heparin level therapeutic (0.40) on 1450 units/hr. Level drawn appropriately. CBC stable. No issues with IV infusion or bleeding noted per RN.   Goal of Therapy:  Heparin level 0.3-0.7 units/ml Monitor platelets by anticoagulation protocol: Yes   Plan:  Continue heparin 1450 units/hr  F/u daily heparin level and CBC  45, PharmD PGY1 Pharmacy Resident 02/12/2021  7:49 AM  Please check [email or web address] for unit-specific pharmacy phone numbers.

## 2021-02-13 LAB — BASIC METABOLIC PANEL
Anion gap: 10 (ref 5–15)
BUN: 25 mg/dL — ABNORMAL HIGH (ref 6–20)
CO2: 29 mmol/L (ref 22–32)
Calcium: 9.2 mg/dL (ref 8.9–10.3)
Chloride: 92 mmol/L — ABNORMAL LOW (ref 98–111)
Creatinine, Ser: 1.22 mg/dL (ref 0.61–1.24)
GFR, Estimated: >60 mL/min (ref >60)
Glucose, Bld: 100 mg/dL — ABNORMAL HIGH (ref 70–99)
Potassium: 4 mmol/L (ref 3.5–5.1)
Sodium: 131 mmol/L — ABNORMAL LOW (ref 135–145)

## 2021-02-13 LAB — CBC
HCT: 38.1 % — ABNORMAL LOW (ref 39.0–52.0)
Hemoglobin: 11.5 g/dL — ABNORMAL LOW (ref 13.0–17.0)
MCH: 22.4 pg — ABNORMAL LOW (ref 26.0–34.0)
MCHC: 30.2 g/dL (ref 30.0–36.0)
MCV: 74.1 fL — ABNORMAL LOW (ref 80.0–100.0)
Platelets: 182 10*3/uL (ref 150–400)
RBC: 5.14 MIL/uL (ref 4.22–5.81)
RDW: 30.5 % — ABNORMAL HIGH (ref 11.5–15.5)
WBC: 18.5 10*3/uL — ABNORMAL HIGH (ref 4.0–10.5)
nRBC: 0.1 % (ref 0.0–0.2)

## 2021-02-13 LAB — CARDIOLIPIN ANTIBODIES, IGG, IGM, IGA
Anticardiolipin IgA: <9 APL U/mL (ref 0–11)
Anticardiolipin IgG: <9 GPL U/mL (ref 0–14)
Anticardiolipin IgM: 12 MPL U/mL (ref 0–12)

## 2021-02-13 LAB — COOXEMETRY PANEL
Carboxyhemoglobin: 1.7 % — ABNORMAL HIGH (ref 0.5–1.5)
Methemoglobin: 0.7 % (ref 0.0–1.5)
O2 Saturation: 53.9 %
Total hemoglobin: 11.1 g/dL — ABNORMAL LOW (ref 12.0–16.0)

## 2021-02-13 LAB — MAGNESIUM: Magnesium: 2.3 mg/dL (ref 1.7–2.4)

## 2021-02-13 LAB — HEPARIN LEVEL (UNFRACTIONATED): Heparin Unfractionated: 0.43 IU/mL (ref 0.30–0.70)

## 2021-02-13 NOTE — Progress Notes (Signed)
ANTICOAGULATION CONSULT NOTE  Pharmacy Consult heparin Indication: pre-existing DVT  Allergies  Allergen Reactions   Iodide Rash    Patient Measurements: Height: 5\' 11"  (180.3 cm) Weight: 69.2 kg (152 lb 8.9 oz) IBW/kg (Calculated) : 75.3  Heparin Dosing Weight: 72.6 kg  Vital Signs: Temp: 98 F (36.7 C) (11/27 0718) Temp Source: Oral (11/27 0718) BP: 125/88 (11/27 0342) Pulse Rate: 80 (11/27 0342)  Labs: Recent Labs    02/11/21 0500 02/11/21 0511 02/12/21 0500 02/13/21 0500  HGB 10.4*  --  10.9* 11.5*  HCT 36.4*  --  37.3* 38.1*  PLT 174  --  170 182  HEPARINUNFRC 0.47  --  0.40 0.43  CREATININE 1.37*  --  1.33* 1.22  CKTOTAL  --  9*  --   --     Estimated Creatinine Clearance: 77.2 mL/min (by C-G formula based on SCr of 1.22 mg/dL).  Assessment: 40 y.o. male with medical history significant for asthma/COPD, recent DVT, tobacco abuse who presented with 3 days SOB and CP. CT showed no evidence of pulmonary embolism. 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 Pharmacy consulted to start heparin infusion.   Heparin level 0.43 remains therapeutic on 1450 units/hr. Level drawn appropriately. CBC stable. No issues with IV infusion or bleeding noted per RN.   Goal of Therapy:  Heparin level 0.3-0.7 units/ml Monitor platelets by anticoagulation protocol: Yes   Plan:  Continue heparin 1450 units/hr  F/u daily heparin level and CBC  45, PharmD PGY1 Pharmacy Resident 02/13/2021  7:56 AM  Please check [email or web address] for unit-specific pharmacy phone numbers.

## 2021-02-13 NOTE — Progress Notes (Signed)
Patient ID: Mark Garrett, male   DOB: 03-13-1981, 40 y.o.   MRN: [id number]      Advanced Heart Failure Rounding Note  PCP-Cardiologist: None   Subjective:    Remains on milrinone 0.375 with co-ox 70%. CVP 3.    Remains in NSR on po amiodarone.   Teeth out for surgery.   No complaints this morning, has walked in the hall.   TEE (11/16): EF 35-40%, there was mobile vegetation on both the anterior and posterior mitral leaflets.  The leaflets did not completely coapt (?destruction from endocarditis) with severe mitral regurgitation (ERO 0.56 cm^2).  There was extensive plaque in the proximal descending thoracic aorta with what appears to be mobile vegetation attached to it.   LHC/RHC:   Coronary Findings  Diagnostic Dominance: Right Left Anterior Descending  Mid LAD lesion is 95% stenosed.  Ramus Intermedius  Ramus-1 lesion is 50% stenosed.  Ramus-2 lesion is 80% stenosed.  Right Coronary Artery  Mid RCA lesion is 30% stenosed.  Graft To PLV  Origin lesion is 100% stenosed.  LIMA Graft To Dist LAD  Intervention  No interventions have been documented. Right Heart  Right Heart Pressures RHC Procedural Findings: Hemodynamics (mmHg) on milrinone 0.375 RA mean 18 RV 69/20 PA 76/25, mean 44 PCWP mean 22, v-waves to 37 mmHg LV 100/23 AO 100/60  Oxygen saturations: PA 58% AO 97%  Cardiac Output (Fick) 5.33  Cardiac Index (Fick) 2.67 PVR 4.1 WU  Cardiac Output (Thermo) 4.89 Cardiac Index (Thermo) 2.45  PVR 4.5 WU  PAPI: 2.8    Objective:   Weight Range: 69.2 kg Body mass index is 21.28 kg/m.   Vital Signs:   Temp:  [98 F (36.7 C)-99.2 F (37.3 C)] 98 F (36.7 C) (11/27 0718) Pulse Rate:  [73-82] 73 (11/27 0718) Resp:  [16-17] 17 (11/27 0718) BP: (103-132)/(67-90) 103/88 (11/27 0718) SpO2:  [97 %-99 %] 99 % (11/27 0718) Last BM Date: 02/11/21  Weight change: Filed Weights   02/10/21 0334 02/11/21 0400 02/12/21 0410  Weight: 70.7 kg 69.1 kg 69.2  kg    Intake/Output:   Intake/Output Summary (Last 24 hours) at 02/13/2021 0927 Last data filed at 02/13/2021 0900 Gross per 24 hour  Intake 1913.22 ml  Output 3800 ml  Net -1886.78 ml      Physical Exam   CVP 3  General: NAD Neck: No JVD, no thyromegaly or thyroid nodule.  Lungs: Clear to auscultation bilaterally with normal respiratory effort. CV: Nondisplaced PMI.  Heart regular S1/S2, no S3/S4, 3/6 HSM apex.  No peripheral edema.   Abdomen: Soft, nontender, no hepatosplenomegaly, no distention.  Skin: Intact without lesions or rashes.  Neurologic: Alert and oriented x 3.  Psych: Normal affect. Extremities: No clubbing or cyanosis. S/p left foot traumatic amputation.  HEENT: Normal.    Telemetry   SR 70-80s with occasional PVCs (personally reviewed)   Labs    CBC Recent Labs    02/12/21 0500 02/13/21 0500  WBC 17.0* 18.5*  HGB 10.9* 11.5*  HCT 37.3* 38.1*  MCV 75.1* 74.1*  PLT 170 710   Basic Metabolic Panel Recent Labs    02/12/21 0500 02/13/21 0500  NA 132* 131*  K 4.0 4.0  CL 92* 92*  CO2 30 29  GLUCOSE 118* 100*  BUN 29* 25*  CREATININE 1.33* 1.22  CALCIUM 9.4 9.2  MG 2.3 2.3   Liver Function Tests No results for input(s): AST, ALT, ALKPHOS, BILITOT, PROT, ALBUMIN in the last 72 hours.  No results for input(s): LIPASE, AMYLASE in the last 72 hours. Cardiac Enzymes Recent Labs    02/11/21 0511  CKTOTAL 9*    BNP: BNP (last 3 results) Recent Labs    01/27/21 1034  BNP 1,733.0*    ProBNP (last 3 results) No results for input(s): PROBNP in the last 8760 hours.   D-Dimer No results for input(s): DDIMER in the last 72 hours. Hemoglobin A1C No results for input(s): HGBA1C in the last 72 hours. Fasting Lipid Panel No results for input(s): CHOL, HDL, LDLCALC, TRIG, CHOLHDL, LDLDIRECT in the last 72 hours.  Thyroid Function Tests No results for input(s): TSH, T4TOTAL, T3FREE, THYROIDAB in the last 72 hours.  Invalid input(s):  FREET3  Other results:   Imaging    No results found.   Medications:     Scheduled Medications:  amiodarone  200 mg Oral BID   aspirin  81 mg Oral Daily   Chlorhexidine Gluconate Cloth  6 each Topical Daily   dapagliflozin propanediol  10 mg Oral Daily   dextromethorphan-guaiFENesin  1 tablet Oral BID   digoxin  0.125 mg Oral Daily   feeding supplement  237 mL Oral TID BM   Living Better with Heart Failure Book   Does not apply Once   multivitamin with minerals  1 tablet Oral Daily   nicotine  21 mg Transdermal QHS   pantoprazole  40 mg Oral Daily   rosuvastatin  40 mg Oral Daily   sodium chloride flush  10-40 mL Intracatheter Q12H   sodium chloride flush  3 mL Intravenous Q12H   sodium chloride flush  3 mL Intravenous Q12H   spironolactone  25 mg Oral Daily   Thrombi-Pad  1 each Topical Once   torsemide  10 mg Oral Daily    Infusions:  sodium chloride     sodium chloride Stopped (01/31/21 2357)   ceFEPime (MAXIPIME) IV 2 g (02/13/21 0510)   DAPTOmycin (CUBICIN)  IV Stopped (02/12/21 2139)   heparin 1,450 Units/hr (02/13/21 0913)   lactated ringers 10 mL/hr at 02/13/21 0548   magnesium sulfate bolus IVPB     milrinone 0.375 mcg/kg/min (02/12/21 2340)    PRN Medications: sodium chloride, Place/Maintain arterial line **AND** sodium chloride, acetaminophen, magnesium hydroxide, ondansetron (ZOFRAN) IV, sodium chloride flush, sodium chloride flush    Assessment/Plan   1. Acute systolic CHF: Ischemic cardiomyopathy, symptoms worsened by severe MR.  I reviewed the echo done, showing EF 35-40%, severe hypokinesis inferior and inferolateral walls, moderate LV dilation with mild LVH, mild RV dilation with moderately decreased systolic function, restricted posterior mitral leaflet and calcified mitral valve with severe mitral regurgitation and at least mild mitral stenosis (mean gradient 8 mmHg), PASP 60. Per report, echo at [facility] showed EF 55-60% with severe MR in  10/22.  It was a technically difficult study per report.  It is possible that he had a new ACS event prior to admission with reported drop in EF though HS-TnI with mild elevation and no trend suggests that it was not immediately prior to admission.  Low output noted with co-ox 39% initially, lactate elevated when he was initially admitted.  Currently on milrinone 0.375 with co-ox 70%, CVP 3. - Continue milrinone 0.375.    - Continue digoxin 0.125 daily.  - Continue torsemide 10 mg daily.  - Continue spironolactone 25 mg daily.  - Continue Farxiga 10 mg daily.  2. CAD: S/p CABG 2006.  As above, with fall in EF and CHF exacerbation, possible  ACS prior to admission.  However, mild HS-TnI elevation with no trend suggests that ACS was not immediately prior to admission.  Current elevation is likely demand ischemia from volume overload. LHC on 11/14 showed patent LIMA-LAD with SVG occluded at aorta (does not look new); there was complex 80% proximal ramus stenosis.  Initially assumed that the SVG was to the ramus, but was subsequently got the CABG operative report from [place] and it looks like it was an SVG-PLV.  There was minimal native RCA disease. LAD territory well-supplied by LIMA. No chest pain.   - Continue heparin gtt while off Xarelto.  - ASA 81 - Crestor 40 - Will need SVG-ramus with valve surgery.   3.  Mitral valve disease: Initial echo showed posterior MV leaflet restricted with severe MR, suspected primarily infarct-related MR given LV dilation and inferior/inferolateral severe hypokinesis.  However, TEE on 11/16 showed vegetation (not bulky but clearly present) on the posterior and anterior leaflets with poor leaflet coaptation, suggesting endocarditis may be the major cause of severe MR.  Reviewed with ID, suspect the mitral vegetations have been present for a while (severe MR on 10/22 echo as well, does not appear to have active infection).  Cannot rule out nonbacterial thrombotic  endocarditis (had recent DVT also), but somewhat academic at this point as he will be on anticoagulation for the non-triggered DVT and we are going to treat with antibiotic course given presentation and high WBCs. ANA negative.  - Seen by Dr. Cyndia Bent, will need mitral valve replacement. Currently scheduled for 12/1  - He has had dental surgery to remove decayed teeth pre-op.   4. 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  ?due to sedentary lifestyle + ?genetic predisposition.  He is not a Xarelto failure, had DVT found in 10/22 with Xarelto started at this point, 11/22 dopplers showed partially treated DVTs.  - Was on Xarelto PTA, now heparin gtt.  Restart Xarelto prior to discharge.  5. Smoking: Needs to stop.   6. PNA: Cultures 11/18 NGTD.  RML PNA on CT chest.  - He completed course of abx initially for PNA.  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 He is able to walk on the stump.  - PT following, continue to mobilize => walk daily in hall.  8. 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  There is also mobile vegetation that appears adherent to plaque in the proximal descending thoracic aorta.  CT head showed no evidence for embolic disease.  Possible source would be due to poor dentition. Blood cultures from 11/10 and 11/16 NGTD.  He was initially on abx for PNA.  We re-drew cultures on 11/16 and 11/24, all negative so far.  ESR is 1, CRP 1.7.  He had been already partially treated when we found the endocarditis (abx for PNA) and suspect that the endocarditis is old as he had severe MR also on echo in 10/22. ID following.  WBCs rose to >30 after teeth removed but now coming back down (17 today).  Suspect worsening leukocytosis was a response to dental surgery. Afebrile. ID has seen, adjusted abx.  - Per ID, continue 6 wks of IV abx to 12/27 =>cefepime and started daptomycin to replace vancomycin.   - Will need MV sent for tissue culture  when surgery done.   9. Microcytic anemia - Iron stores low, had Feraheme   As he is now milrinone-dependent and lives alone, looks like we will  have to keep him in hospital until 12/1 surgery. If [facility] has any cancellations or reschedules, he is ready to go earlier.  He will need SNF after discharge.   Length of Stay: 17  Loralie Champagne, MD  02/13/2021, 9:27 AM  Advanced Heart Failure Team Pager [phone] (M-F; 7a - 5p)  Please contact [facility] for night-coverage after hours (5p -7a ) and weekends on [email or web address]

## 2021-02-14 LAB — HEPARIN LEVEL (UNFRACTIONATED): Heparin Unfractionated: 0.4 IU/mL (ref 0.30–0.70)

## 2021-02-14 LAB — BASIC METABOLIC PANEL
Anion gap: 9 (ref 5–15)
BUN: 20 mg/dL (ref 6–20)
CO2: 28 mmol/L (ref 22–32)
Calcium: 9 mg/dL (ref 8.9–10.3)
Chloride: 94 mmol/L — ABNORMAL LOW (ref 98–111)
Creatinine, Ser: 1.02 mg/dL (ref 0.61–1.24)
GFR, Estimated: >60 mL/min (ref >60)
Glucose, Bld: 113 mg/dL — ABNORMAL HIGH (ref 70–99)
Potassium: 3.8 mmol/L (ref 3.5–5.1)
Sodium: 131 mmol/L — ABNORMAL LOW (ref 135–145)

## 2021-02-14 LAB — MAGNESIUM: Magnesium: 2.2 mg/dL (ref 1.7–2.4)

## 2021-02-14 LAB — CBC
HCT: 36.7 % — ABNORMAL LOW (ref 39.0–52.0)
Hemoglobin: 10.4 g/dL — ABNORMAL LOW (ref 13.0–17.0)
MCH: 22 pg — ABNORMAL LOW (ref 26.0–34.0)
MCHC: 28.3 g/dL — ABNORMAL LOW (ref 30.0–36.0)
MCV: 77.6 fL — ABNORMAL LOW (ref 80.0–100.0)
Platelets: 174 10*3/uL (ref 150–400)
RBC: 4.73 MIL/uL (ref 4.22–5.81)
RDW: 31.2 % — ABNORMAL HIGH (ref 11.5–15.5)
WBC: 16.3 10*3/uL — ABNORMAL HIGH (ref 4.0–10.5)
nRBC: 0 % (ref 0.0–0.2)

## 2021-02-14 LAB — COOXEMETRY PANEL
Carboxyhemoglobin: 2 % — ABNORMAL HIGH (ref 0.5–1.5)
Methemoglobin: 0.7 % (ref 0.0–1.5)
O2 Saturation: 54.6 %
Total hemoglobin: 10.9 g/dL — ABNORMAL LOW (ref 12.0–16.0)

## 2021-02-14 MED ORDER — POTASSIUM CHLORIDE CRYS ER 20 MEQ PO TBCR
40.0000 meq | EXTENDED_RELEASE_TABLET | Freq: Once | ORAL | Status: AC
  Administered 2021-02-14: 11:00:00 40 meq via ORAL
  Filled 2021-02-14: qty 2

## 2021-02-14 MED ORDER — SODIUM CHLORIDE 0.9 % IV SOLN
9.0000 mg/kg | Freq: Every day | INTRAVENOUS | Status: DC
  Administered 2021-02-14 – 2021-02-28 (×15): 650 mg via INTRAVENOUS
  Filled 2021-02-14 (×18): qty 13

## 2021-02-14 MED ORDER — SODIUM CHLORIDE 0.9 % IV SOLN
2.0000 g | INTRAVENOUS | Status: DC
  Administered 2021-02-14 – 2021-03-01 (×16): 2 g via INTRAVENOUS
  Filled 2021-02-14 (×20): qty 20

## 2021-02-14 NOTE — Progress Notes (Signed)
Pharmacy Antibiotic Note  Mark Garrett is a 40 y.o. male admitted on 01/27/2021 with acute exacerbation of heart failure now with suspected culture negative endocarditis.  Pharmacy has been consulted to restart cefepime and vancomycin after persistent leukocytosis in the setting of possible culture negative endocarditis.  ID changed antibiotics to daptomycin + ceftriaxone for kidney protection.   Plan: Daptomycin  9 mg/kg/day Baseline CK then weekly Ceftriaxone 2g q 24 hrs.  Temp (24hrs), Avg:98.3 F (36.8 C), Min:97.6 F (36.4 C), Max:98.5 F (36.9 C)  Recent Labs  Lab 02/10/21 0459 02/11/21 0500 02/12/21 0500 02/13/21 0500 02/14/21 0500  WBC 31.9* 21.2* 17.0* 18.5* 16.3*  CREATININE 1.32* 1.37* 1.33* 1.22 1.02     Estimated Creatinine Clearance: 93.4 mL/min (by C-G formula based on SCr of 1.02 mg/dL).    Allergies  Allergen Reactions   Iodide Rash    Antimicrobials this admission: Vancomycin 11/16 >>11/17, 11/20>>11/24 Daptomycin 11/25>> Cefepime 11/16 >>11/17, 11/20>> 11/28 Ceftriaxone 11/28 >  Augmentin 11/11 > 11/15 (CAP treatment) Ceftriaxone, Azithromycin 11/10 x1   Dose adjustments this admission: none  Microbiology results: 11/24 Bcx: ngtd 11/19 fungus BCx: ngtd 11/19 TB Ab - ip 11/16 BCx: ngtdF 11/10 BCx: NGF 11/14 MRSA PCR: negative   Mark Garrett, BCPS, BCCP Clinical Pharmacist  02/14/2021 1:46 PM   [facility] pharmacy phone numbers are listed on [email or web address]

## 2021-02-14 NOTE — TOC Progression Note (Addendum)
Transition of Care [facility]) - Progression Note    Patient Details  Name: Mark Garrett MRN: [id number] Date of Birth: February 25, 1981  [facility] CM/SW Contact  Lilyrose Tanney, LCSWA Phone Number: 02/14/2021, 10:40 AM  Clinical Narrative:    HF CSW reached out to [employer] to see about [name] qualifying for Medicaid awaiting a response. If [name] will need SNF at discharge (consult stating that [name] is scheduled for mitral valve replacement 12/01 and will likely need SNF at DC) he will need health insurance or see about possible LOG at that time. Pending patient progress.  [employer] reported that the Medicaid Potential was added incorrectly to [name] chart and that his initial screening he did not meet a Medicaid category. [employer] is reviewing to see if [name] will meet a disability category as that is the only way he will qualify for Medicaid. His EF% is not low enough to meet the disability guidelines so [employer] is looking closer at [name] other diagnoses to determine if that meets a long term disability guideline. Awaiting to hear back from [employer].  CSW will continue to follow throughout discharge.   Expected Discharge Plan: Home/Self Care Barriers to Discharge: Continued Medical Work up  Expected Discharge Plan and Services Expected Discharge Plan: Home/Self Care In-house Referral: Clinical Social Work Discharge Planning Services: CM Consult, Indigent Health Clinic, MATCH Program, Medication Assistance, Follow-up appt scheduled   Living arrangements for the past 2 months: Single Family Home                   DME Agency: NA       HH Arranged: NA           Social Determinants of Health (SDOH) Interventions Food Insecurity Interventions: Other (Comment) (Patient has food stamps $250 a month) Financial Strain Interventions: Intervention Not Indicated, Artist (referral to [employer] to screen for Medicaid,  patient agreeable) Housing Interventions: Intervention Not Indicated Transportation Interventions: Retail banker  Readmission Risk Interventions No flowsheet data found.  Jamyria Ozanich, MSW, LCSWA [phone] Heart Failure Social Worker

## 2021-02-14 NOTE — Progress Notes (Signed)
ANTICOAGULATION CONSULT NOTE  Pharmacy Consult heparin Indication: pre-existing DVT  Allergies  Allergen Reactions   Iodide Rash    Patient Measurements: Height: 5\' 11"  (180.3 cm) Weight: 70 kg (154 lb 6.4 oz) IBW/kg (Calculated) : 75.3  Heparin Dosing Weight: 72.6 kg  Vital Signs: Temp: 98.5 F (36.9 C) (11/28 1132) Temp Source: Oral (11/28 1132) BP: 108/62 (11/28 1132) Pulse Rate: 74 (11/28 1132)  Labs: Recent Labs    02/12/21 0500 02/13/21 0500 02/14/21 0500  HGB 10.9* 11.5* 10.4*  HCT 37.3* 38.1* 36.7*  PLT 170 182 174  HEPARINUNFRC 0.40 0.43 0.40  CREATININE 1.33* 1.22 1.02    Estimated Creatinine Clearance: 93.4 mL/min (by C-G formula based on SCr of 1.02 mg/dL).  Assessment: 40 y.o. male with medical history significant for asthma/COPD, recent DVT, tobacco abuse who presented with 3 days SOB and CP. CT showed no evidence of pulmonary embolism. 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 Pharmacy consulted to start heparin infusion.   Heparin level 0.4 remains therapeutic on 1450 units/hr.. CBC stable. No issues with IV infusion or bleeding noted per RN.   Goal of Therapy:  Heparin level 0.3-0.7 units/ml Monitor platelets by anticoagulation protocol: Yes   Plan:  Continue heparin 1450 units/hr  F/u daily heparin level and CBC  45, Reece Leader, BCPS, BCCP Clinical Pharmacist  02/14/2021 3:08 PM   [facility] pharmacy phone numbers are listed on [email or web address]

## 2021-02-14 NOTE — Progress Notes (Addendum)
Patient ID: Mark Garrett, male   DOB: [birth year], 39 y.o.   MRN: [id number]      Advanced Heart Failure Rounding Note  PCP-Cardiologist: None   Subjective:    Co-ox 54% on 0.375 milrinone. CVP 4  Remains in NSR on po amiodarone  Had 10 teeth extracted on 11/22 in anticipation of upcoming surgery  AM labs pending  Feeling well. No chest pain or dyspnea.   TEE (11/16): EF 35-40%, there was mobile vegetation on both the anterior and posterior mitral leaflets.  The leaflets did not completely coapt (?destruction from endocarditis) with severe mitral regurgitation (ERO 0.56 cm^2).  There was extensive plaque in the proximal descending thoracic aorta with what appears to be mobile vegetation attached to it.   LHC/RHC:   Coronary Findings  Diagnostic Dominance: Right Left Anterior Descending  Mid LAD lesion is 95% stenosed.  Ramus Intermedius  Ramus-1 lesion is 50% stenosed.  Ramus-2 lesion is 80% stenosed.  Right Coronary Artery  Mid RCA lesion is 30% stenosed.  Graft To PLV  Origin lesion is 100% stenosed.  LIMA Graft To Dist LAD  Intervention  No interventions have been documented. Right Heart  Right Heart Pressures RHC Procedural Findings: Hemodynamics (mmHg) on milrinone 0.375 RA mean 18 RV 69/20 PA 76/25, mean 44 PCWP mean 22, v-waves to 37 mmHg LV 100/23 AO 100/60  Oxygen saturations: PA 58% AO 97%  Cardiac Output (Fick) 5.33  Cardiac Index (Fick) 2.67 PVR 4.1 WU  Cardiac Output (Thermo) 4.89 Cardiac Index (Thermo) 2.45  PVR 4.5 WU  PAPI: 2.8    Objective:   Weight Range: 70 kg Body mass index is 21.53 kg/m.   Vital Signs:   Temp:  [97.6 F (36.4 C)-98.7 F (37.1 C)] 98.4 F (36.9 C) (11/28 0458) Pulse Rate:  [72-82] 81 (11/28 0458) Resp:  [16-18] 18 (11/28 0000) BP: (93-132)/(73-88) 124/81 (11/28 0458) SpO2:  [97 %-99 %] 97 % (11/28 0458) Weight:  [70 kg] 70 kg (11/28 0323) Last BM Date: 02/11/21  Weight change: Filed Weights    02/11/21 0400 02/12/21 0410 02/14/21 0323  Weight: 69.1 kg 69.2 kg 70 kg    Intake/Output:   Intake/Output Summary (Last 24 hours) at 02/14/2021 0716 Last data filed at 02/14/2021 0648 Gross per 24 hour  Intake 2552.07 ml  Output 2775 ml  Net -222.93 ml      Physical Exam  CVP 4 General:  No distress. Lying comfortably in bed. HEENT: normal Neck: supple. no JVD. Carotids 2+ bilat; no bruits. Left IJ central venous catheter Cor: PMI nondisplaced. Regular rate & rhythm. No rubs, gallops, 3/6 HSM best heard at apex Lungs: clear Abdomen: soft, nontender, nondistended.  Extremities: no cyanosis, clubbing, rash, s/p traumatic left foot amputation Neuro: alert & orientedx3, cranial nerves grossly intact. moves all 4 extremities w/o difficulty. Affect pleasant    Telemetry   SR 70s, occasionally PVCs (< 5/min)   Labs    CBC Recent Labs    02/12/21 0500 02/13/21 0500  WBC 17.0* 18.5*  HGB 10.9* 11.5*  HCT 37.3* 38.1*  MCV 75.1* 74.1*  PLT 170 540   Basic Metabolic Panel Recent Labs    02/12/21 0500 02/13/21 0500  NA 132* 131*  K 4.0 4.0  CL 92* 92*  CO2 30 29  GLUCOSE 118* 100*  BUN 29* 25*  CREATININE 1.33* 1.22  CALCIUM 9.4 9.2  MG 2.3 2.3   Liver Function Tests No results for input(s): AST, ALT, ALKPHOS, BILITOT, PROT, ALBUMIN  in the last 72 hours.  No results for input(s): LIPASE, AMYLASE in the last 72 hours. Cardiac Enzymes No results for input(s): CKTOTAL, CKMB, CKMBINDEX, TROPONINI in the last 72 hours.   BNP: BNP (last 3 results) Recent Labs    01/27/21 1034  BNP 1,733.0*    ProBNP (last 3 results) No results for input(s): PROBNP in the last 8760 hours.   D-Dimer No results for input(s): DDIMER in the last 72 hours. Hemoglobin A1C No results for input(s): HGBA1C in the last 72 hours. Fasting Lipid Panel No results for input(s): CHOL, HDL, LDLCALC, TRIG, CHOLHDL, LDLDIRECT in the last 72 hours.  Thyroid Function Tests No results  for input(s): TSH, T4TOTAL, T3FREE, THYROIDAB in the last 72 hours.  Invalid input(s): FREET3  Other results:   Imaging    No results found.   Medications:     Scheduled Medications:  amiodarone  200 mg Oral BID   aspirin  81 mg Oral Daily   Chlorhexidine Gluconate Cloth  6 each Topical Daily   dapagliflozin propanediol  10 mg Oral Daily   dextromethorphan-guaiFENesin  1 tablet Oral BID   digoxin  0.125 mg Oral Daily   feeding supplement  237 mL Oral TID BM   Living Better with Heart Failure Book   Does not apply Once   multivitamin with minerals  1 tablet Oral Daily   nicotine  21 mg Transdermal QHS   pantoprazole  40 mg Oral Daily   rosuvastatin  40 mg Oral Daily   sodium chloride flush  10-40 mL Intracatheter Q12H   sodium chloride flush  3 mL Intravenous Q12H   sodium chloride flush  3 mL Intravenous Q12H   spironolactone  25 mg Oral Daily   Thrombi-Pad  1 each Topical Once   torsemide  10 mg Oral Daily    Infusions:  sodium chloride     sodium chloride Stopped (01/31/21 2357)   ceFEPime (MAXIPIME) IV 2 g (02/14/21 0527)   DAPTOmycin (CUBICIN)  IV Stopped (02/13/21 2310)   heparin 1,450 Units/hr (02/14/21 0311)   lactated ringers 10 mL/hr at 02/13/21 0548   magnesium sulfate bolus IVPB     milrinone 0.375 mcg/kg/min (02/14/21 0522)    PRN Medications: sodium chloride, Place/Maintain arterial line **AND** sodium chloride, acetaminophen, magnesium hydroxide, ondansetron (ZOFRAN) IV, sodium chloride flush, sodium chloride flush    Assessment/Plan   1. Acute systolic CHF: Ischemic cardiomyopathy, symptoms worsened by severe MR.  Echo with EF 35-40%, severe hypokinesis inferior and inferolateral walls, moderate LV dilation with mild LVH, mild RV dilation with moderately decreased systolic function, restricted posterior mitral leaflet and calcified mitral valve with severe mitral regurgitation and at least mild mitral stenosis (mean gradient 8 mmHg), PASP 60. Per  report, echo at [facility] showed EF 55-60% with severe MR in 10/22.  It was a technically difficult study per report.  It is possible that he had a new ACS event prior to admission with reported drop in EF though HS-TnI with mild elevation and no trend suggests that it was not immediately prior to admission.  Low output noted with co-ox 39% initially, lactate elevated when he was initially admitted.  Currently on milrinone 0.375 with co-ox 54%, CVP 4 - Continue milrinone 0.375.    - Continue digoxin 0.125 daily.  - Continue torsemide 10 mg daily.  - Continue spironolactone 25 mg daily.  - Continue Farxiga 10 mg daily.  2. CAD: S/p CABG 2006.  As above, with fall in EF  and CHF exacerbation, possible ACS prior to admission.  However, mild HS-TnI elevation with no trend suggests that ACS was not immediately prior to admission.  Current elevation is likely demand ischemia from volume overload. LHC on 11/14 showed patent LIMA-LAD with SVG occluded at aorta (does not look new); there was complex 80% proximal ramus stenosis.  Initially assumed that the SVG was to the ramus, but subsequently got the CABG operative report from [facility] and it looks like it was an SVG-PLV.  There was minimal native RCA disease. LAD territory well-supplied by LIMA. No chest pain.   - Continue heparin gtt while off Xarelto.  - ASA 81  - Crestor 40 - Will need SVG-ramus with valve surgery.   3.  Mitral valve disease: Initial echo showed posterior MV leaflet restricted with severe MR, suspected primarily infarct-related MR given LV dilation and inferior/inferolateral severe hypokinesis.  However, TEE on 11/16 showed vegetation (not bulky but clearly present) on the posterior and anterior leaflets with poor leaflet coaptation, suggesting endocarditis may be the major cause of severe MR.  Reviewed with ID, suspect the mitral vegetations have been present for a while (severe MR on 10/22 echo as well, does not appear to have active  infection).  Cannot rule out nonbacterial thrombotic endocarditis (had recent DVT also), but somewhat academic at this point as he will be on anticoagulation for the non-triggered DVT and we are going to treat with antibiotic course given presentation and high WBCs. ANA negative.  - Seen by Dr. Cyndia Bent, will need mitral valve replacement. Currently scheduled for 12/1  - Had dental surgery to remove decayed teeth pre-op.   4. 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  ?due to sedentary lifestyle + ?genetic predisposition.  He is not a Xarelto failure, had DVT found in 10/22 with Xarelto started at this point, 11/22 dopplers showed partially treated DVTs.  - Was on Xarelto PTA, now heparin gtt.  Restart Xarelto prior to discharge.  5. Smoking: Needs to quit.  6. PNA: Cultures 11/18 NGTD.  RML PNA on CT chest.  - He completed course of abx initially for PNA.  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 He is able to walk on the stump.  - PT following, continue to mobilize => walk daily in hall.  8. 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  There is also mobile vegetation that appears adherent to plaque in the proximal descending thoracic aorta.  CT head showed no evidence for embolic disease.  Possible source would be due to poor dentition. Blood cultures from 11/10 and 11/16 NGTD.  He was initially on abx for PNA.  We re-drew cultures on 11/16 and 11/24, all negative so far.  ESR is 1, CRP 1.7.  He had been already partially treated when we found the endocarditis (abx for PNA) and suspect that the endocarditis is old as he had severe MR also on echo in 10/22. ID following.  WBCs rose to >30 after teeth removed but now coming back down (18.5 yesterday, CBC today pending).  Suspect worsening leukocytosis was a response to dental surgery. Afebrile. ID has seen, adjusted abx.  - Per ID, continue 6 wks of IV abx to 12/27 =>cefepime and started  daptomycin to replace vancomycin.   - Will need MV sent for tissue culture when surgery done.   9. Microcytic anemia - Iron stores low, had Feraheme   As he is now milrinone-dependent and lives alone,  looks like we will have to keep him in hospital until 12/1 surgery. If TCTS has any cancellations or reschedules, he is ready to go earlier.  He will need SNF after discharge. Will involve TOC.  Length of Stay: [place], [place], PA-C  02/14/2021, 7:16 AM  Advanced Heart Failure Team Pager [phone] (M-F; 7a - 5p)  Please contact [facility] for night-coverage after hours (5p -7a ) and weekends on [email or web address]   Patient seen with PA, agree with the above note.   He is stable today, volume status ok.  Remains on milrinone.  Waiting for surgery on 12/1.    Loralie Champagne 02/14/2021 12:59 PM

## 2021-02-14 NOTE — Progress Notes (Signed)
Pt ambulated approx 750 ft. Tolerated well, returned to room, call light within reach.   Kalman Jewels, RN 02/14/2021 6:20 PM

## 2021-02-14 NOTE — Progress Notes (Signed)
CARDIAC REHAB PHASE I   Went to offer ambulation several times throughout the day. RN states his neighbor has been yelling consistently most of the night and day. Encouraged rest. Will continue to follow.  Reynold Bowen, RN BSN 02/14/2021 2:46 PM

## 2021-02-15 LAB — BASIC METABOLIC PANEL
Anion gap: 8 (ref 5–15)
BUN: 19 mg/dL (ref 6–20)
CO2: 27 mmol/L (ref 22–32)
Calcium: 9.1 mg/dL (ref 8.9–10.3)
Chloride: 97 mmol/L — ABNORMAL LOW (ref 98–111)
Creatinine, Ser: 1.09 mg/dL (ref 0.61–1.24)
GFR, Estimated: >60 mL/min (ref >60)
Glucose, Bld: 90 mg/dL (ref 70–99)
Potassium: 4.2 mmol/L (ref 3.5–5.1)
Sodium: 132 mmol/L — ABNORMAL LOW (ref 135–145)

## 2021-02-15 LAB — CULTURE, BLOOD (ROUTINE X 2)
Culture: NO GROWTH
Culture: NO GROWTH
Special Requests: ADEQUATE
Special Requests: ADEQUATE

## 2021-02-15 LAB — COOXEMETRY PANEL
Carboxyhemoglobin: 2.1 % — ABNORMAL HIGH (ref 0.5–1.5)
Methemoglobin: 0.9 % (ref 0.0–1.5)
O2 Saturation: 60.8 %
Total hemoglobin: 10.7 g/dL — ABNORMAL LOW (ref 12.0–16.0)

## 2021-02-15 LAB — CBC
HCT: 35.5 % — ABNORMAL LOW (ref 39.0–52.0)
Hemoglobin: 10.5 g/dL — ABNORMAL LOW (ref 13.0–17.0)
MCH: 22.6 pg — ABNORMAL LOW (ref 26.0–34.0)
MCHC: 29.6 g/dL — ABNORMAL LOW (ref 30.0–36.0)
MCV: 76.5 fL — ABNORMAL LOW (ref 80.0–100.0)
Platelets: 173 10*3/uL (ref 150–400)
RBC: 4.64 MIL/uL (ref 4.22–5.81)
RDW: 31.3 % — ABNORMAL HIGH (ref 11.5–15.5)
WBC: 16.6 10*3/uL — ABNORMAL HIGH (ref 4.0–10.5)
nRBC: 0 % (ref 0.0–0.2)

## 2021-02-15 LAB — HEPARIN LEVEL (UNFRACTIONATED)
Heparin Unfractionated: 0.24 IU/mL — ABNORMAL LOW (ref 0.30–0.70)
Heparin Unfractionated: 0.27 IU/mL — ABNORMAL LOW (ref 0.30–0.70)
Heparin Unfractionated: 0.32 IU/mL (ref 0.30–0.70)

## 2021-02-15 LAB — MAGNESIUM: Magnesium: 2.2 mg/dL (ref 1.7–2.4)

## 2021-02-15 NOTE — Progress Notes (Addendum)
Patient ID: Mark Garrett, male   DOB: [birth year], 40 y.o.   MRN: [id number]      Advanced Heart Failure Rounding Note  PCP-Cardiologist: None   Subjective:    Co-ox 61% on 0.375 milrinone.   Denies SOB. Walked down the hall without difficulty.   TEE (11/16): EF 35-40%, there was mobile vegetation on both the anterior and posterior mitral leaflets.  The leaflets did not completely coapt (?destruction from endocarditis) with severe mitral regurgitation (ERO 0.56 cm^2).  There was extensive plaque in the proximal descending thoracic aorta with what appears to be mobile vegetation attached to it.   LHC/RHC:   Coronary Findings  Diagnostic Dominance: Right Left Anterior Descending  Mid LAD lesion is 95% stenosed.  Ramus Intermedius  Ramus-1 lesion is 50% stenosed.  Ramus-2 lesion is 80% stenosed.  Right Coronary Artery  Mid RCA lesion is 30% stenosed.  Graft To PLV  Origin lesion is 100% stenosed.  LIMA Graft To Dist LAD  Intervention  No interventions have been documented. Right Heart  Right Heart Pressures RHC Procedural Findings: Hemodynamics (mmHg) on milrinone 0.375 RA mean 18 RV 69/20 PA 76/25, mean 44 PCWP mean 22, v-waves to 37 mmHg LV 100/23 AO 100/60  Oxygen saturations: PA 58% AO 97%  Cardiac Output (Fick) 5.33  Cardiac Index (Fick) 2.67 PVR 4.1 WU  Cardiac Output (Thermo) 4.89 Cardiac Index (Thermo) 2.45  PVR 4.5 WU  PAPI: 2.8    Objective:   Weight Range: 70 kg Body mass index is 21.53 kg/m.   Vital Signs:   Temp:  [98.3 F (36.8 C)-98.6 F (37 C)] 98.4 F (36.9 C) (11/29 0355) Pulse Rate:  [74-80] 77 (11/29 0355) Resp:  [16-18] 17 (11/29 0355) BP: (105-133)/(56-92) 132/86 (11/29 0355) SpO2:  [95 %-100 %] 95 % (11/29 0355) Last BM Date: 02/13/21  Weight change: Filed Weights   02/11/21 0400 02/12/21 0410 02/14/21 0323  Weight: 69.1 kg 69.2 kg 70 kg    Intake/Output:   Intake/Output Summary (Last 24 hours) at 02/15/2021  0727 Last data filed at 02/15/2021 0634 Gross per 24 hour  Intake 3078.46 ml  Output 3330 ml  Net -251.54 ml      Physical Exam  CVP 2 General:  Well appearing. No resp difficulty HEENT: normal Neck: supple. no JVD. Carotids 2+ bilat; no bruits. No lymphadenopathy or thryomegaly appreciated.LIJ  Cor: PMI nondisplaced. Regular rate & rhythm. No rubs, gallops. 3/6 HSM . Lungs: clear on room air.  Abdomen: soft, nontender, nondistended. No hepatosplenomegaly. No bruits or masses. Good bowel sounds. Extremities: no cyanosis, clubbing, rash, edema Neuro: alert & orientedx3, cranial nerves grossly intact. moves all 4 extremities w/o difficulty. Affect pleasant    Telemetry   SR 70s personally reviewed.    Labs    CBC Recent Labs    02/14/21 0500 02/15/21 0500  WBC 16.3* 16.6*  HGB 10.4* 10.5*  HCT 36.7* 35.5*  MCV 77.6* 76.5*  PLT 174 503   Basic Metabolic Panel Recent Labs    02/14/21 0500 02/15/21 0500  NA 131* 132*  K 3.8 4.2  CL 94* 97*  CO2 28 27  GLUCOSE 113* 90  BUN 20 19  CREATININE 1.02 1.09  CALCIUM 9.0 9.1  MG 2.2 2.2   Liver Function Tests No results for input(s): AST, ALT, ALKPHOS, BILITOT, PROT, ALBUMIN in the last 72 hours.  No results for input(s): LIPASE, AMYLASE in the last 72 hours. Cardiac Enzymes No results for input(s): CKTOTAL, CKMB, CKMBINDEX, TROPONINI  in the last 72 hours.   BNP: BNP (last 3 results) Recent Labs    01/27/21 1034  BNP 1,733.0*    ProBNP (last 3 results) No results for input(s): PROBNP in the last 8760 hours.   D-Dimer No results for input(s): DDIMER in the last 72 hours. Hemoglobin A1C No results for input(s): HGBA1C in the last 72 hours. Fasting Lipid Panel No results for input(s): CHOL, HDL, LDLCALC, TRIG, CHOLHDL, LDLDIRECT in the last 72 hours.  Thyroid Function Tests No results for input(s): TSH, T4TOTAL, T3FREE, THYROIDAB in the last 72 hours.  Invalid input(s): FREET3  Other  results:   Imaging    No results found.   Medications:     Scheduled Medications:  amiodarone  200 mg Oral BID   aspirin  81 mg Oral Daily   Chlorhexidine Gluconate Cloth  6 each Topical Daily   dapagliflozin propanediol  10 mg Oral Daily   dextromethorphan-guaiFENesin  1 tablet Oral BID   digoxin  0.125 mg Oral Daily   feeding supplement  237 mL Oral TID BM   Living Better with Heart Failure Book   Does not apply Once   multivitamin with minerals  1 tablet Oral Daily   nicotine  21 mg Transdermal QHS   pantoprazole  40 mg Oral Daily   rosuvastatin  40 mg Oral Daily   sodium chloride flush  10-40 mL Intracatheter Q12H   sodium chloride flush  3 mL Intravenous Q12H   sodium chloride flush  3 mL Intravenous Q12H   spironolactone  25 mg Oral Daily   Thrombi-Pad  1 each Topical Once   torsemide  10 mg Oral Daily    Infusions:  sodium chloride     sodium chloride Stopped (01/31/21 2357)   cefTRIAXone (ROCEPHIN)  IV Stopped (02/14/21 1505)   DAPTOmycin (CUBICIN)  IV Stopped (02/14/21 2250)   heparin 1,600 Units/hr (02/15/21 0626)   lactated ringers 10 mL/hr at 02/15/21 0019   magnesium sulfate bolus IVPB     milrinone 0.375 mcg/kg/min (02/15/21 0254)    PRN Medications: sodium chloride, Place/Maintain arterial line **AND** sodium chloride, acetaminophen, magnesium hydroxide, ondansetron (ZOFRAN) IV, sodium chloride flush, sodium chloride flush    Assessment/Plan   1. Acute systolic CHF: Ischemic cardiomyopathy, symptoms worsened by severe MR.  Echo with EF 35-40%, severe hypokinesis inferior and inferolateral walls, moderate LV dilation with mild LVH, mild RV dilation with moderately decreased systolic function, restricted posterior mitral leaflet and calcified mitral valve with severe mitral regurgitation and at least mild mitral stenosis (mean gradient 8 mmHg), PASP 60. Per report, echo at [facility] showed EF 55-60% with severe MR in 10/22.  It was a technically  difficult study per report.  It is possible that he had a new ACS event prior to admission with reported drop in EF though HS-TnI with mild elevation and no trend suggests that it was not immediately prior to admission.  Low output noted with co-ox 39% initially, lactate elevated when he was initially admitted.  Currently on milrinone 0.375 with co-ox 61%,  - CVP 2 today - Continue milrinone 0.375.    - Continue digoxin 0.125 daily.  - Continue torsemide 10 mg daily.  - Continue spironolactone 25 mg daily.  - Continue Farxiga 10 mg daily.  - Renal function stable.  2. CAD: S/p CABG 2006.  As above, with fall in EF and CHF exacerbation, possible ACS prior to admission.  However, mild HS-TnI elevation with no trend suggests that ACS  was not immediately prior to admission.  Current elevation is likely demand ischemia from volume overload. LHC on 11/14 showed patent LIMA-LAD with SVG occluded at aorta (does not look new); there was complex 80% proximal ramus stenosis.  Initially assumed that the SVG was to the ramus, but subsequently got the CABG operative report from [place] and it looks like it was an SVG-PLV.  There was minimal native RCA disease. LAD territory well-supplied by LIMA. No chest pain.   - Continue heparin gtt while off Xarelto.  - ASA 81  - Crestor 40 - Will need SVG-ramus with valve surgery.   3.  Mitral valve disease: Initial echo showed posterior MV leaflet restricted with severe MR, suspected primarily infarct-related MR given LV dilation and inferior/inferolateral severe hypokinesis.  However, TEE on 11/16 showed vegetation (not bulky but clearly present) on the posterior and anterior leaflets with poor leaflet coaptation, suggesting endocarditis may be the major cause of severe MR.  Reviewed with ID, suspect the mitral vegetations have been present for a while (severe MR on 10/22 echo as well, does not appear to have active infection).  Cannot rule out nonbacterial thrombotic  endocarditis (had recent DVT also), but somewhat academic at this point as he will be on anticoagulation for the non-triggered DVT and we are going to treat with antibiotic course given presentation and high WBCs. ANA negative.  - Seen by Dr. Cyndia Bent, will need mitral valve replacement.  - Scheduled for 12/1  - Had dental surgery to remove decayed teeth pre-op.   4. 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  ?due to sedentary lifestyle + ?genetic predisposition.  He is not a Xarelto failure, had DVT found in 10/22 with Xarelto started at this point, 11/22 dopplers showed partially treated DVTs.  - Was on Xarelto PTA, now heparin gtt.  Restart Xarelto prior to discharge.  5. Smoking: Needs to quit.  6. PNA: Cultures 11/18 NGTD.  RML PNA on CT chest.  - He completed course of abx initially for PNA.  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 He is able to walk on the stump.  - PT following, continue to mobilize => walk daily in hall.  8. 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  There is also mobile vegetation that appears adherent to plaque in the proximal descending thoracic aorta.  CT head showed no evidence for embolic disease.  Possible source would be due to poor dentition. Blood cultures from 11/10 and 11/16 NGTD.  He was initially on abx for PNA.  We re-drew cultures on 11/16 and 11/24, all negative so far.  ESR is 1, CRP 1.7.  He had been already partially treated when we found the endocarditis (abx for PNA) and suspect that the endocarditis is old as he had severe MR also on echo in 10/22. ID following.  WBCs rose to >30 after teeth removed but now coming back down (16.6 today ).  Suspect worsening leukocytosis was a response to dental surgery. Afebrile. ID has seen, adjusted abx.  - Per ID, continue 6 wks of IV abx to 12/27 =>ceftriaxone and started daptomycin to replace vancomycin.   - Will need MV sent for tissue culture when  surgery done.   9. Microcytic anemia - Iron stores low, had Feraheme    As he is now milrinone-dependent and lives alone, looks like we will have to keep him in hospital until 12/1 surgery. If TCTS has any cancellations or  reschedules, he is ready to go earlier.  He will need SNF after discharge. Will involve TOC.  Length of Stay: [place], NP  02/15/2021, 7:27 AM  Advanced Heart Failure Team Pager [phone] (M-F; 7a - 5p)  Please contact [facility] for night-coverage after hours (5p -7a ) and weekends on [email or web address]  Patient seen with NP, agree with the above note.   Stable today, CVP < 5.  Co-ox 61%.  WBCs mildly elevated but stable at 16.  Cultures negative.  He remains on ceftriaxone and daptomycin, afebrile.   Plan for MV surgery with SVG-ramus on 12/1 with Dr. Cyndia Bent.    Loralie Champagne 02/15/2021 8:36 AM

## 2021-02-15 NOTE — Progress Notes (Signed)
CARDIAC REHAB PHASE I   PRE:  Rate/Rhythm: 72 SR  BP:  Sitting: 90/72      SaO2: 95 RA  MODE:  Ambulation: 340 ft   POST:  Rate/Rhythm: 85 SR  BP:  Sitting: 111/84    SaO2: 96 RA   Pt ambulated 393ft in hallway standby assist with slow gait. Pt took one short standing rest break c/o leg fatigue and pain. Pt returned to bed. Encouraged continued ambulation and IS use. Pt denies questions or concerns regarding upcoming surgery. Will continue to follow.  [id number] Reynold Bowen, RN BSN 02/15/2021 9:37 AM
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
## 2021-02-15 NOTE — Progress Notes (Signed)
Nutrition Follow-up  DOCUMENTATION CODES:   Severe malnutrition in context of chronic illness (component of social-environmental as well)  INTERVENTION:   Continue Ensure Enlive po TID, each supplement provides 350 kcal and 20 grams of protein.  Continue MVI with minerals daily.  NUTRITION DIAGNOSIS:   Severe Malnutrition related to chronic illness (CHF exacerbation) as evidenced by percent weight loss, severe fat depletion, energy intake < 75% for > or equal to 1 month.  Ongoing  GOAL:   Patient will meet greater than or equal to 90% of their needs  Met  MONITOR:   PO intake, Supplement acceptance, Labs, Weight trends, I & O's  REASON FOR ASSESSMENT:   Consult Assessment of nutrition requirement/status  ASSESSMENT:   Pt with PMH significant for asthma, COPD, tobacco abuse, s/p aortic revascularization, and DVT admitted with acute hypoxemic respiratory failure 2/2 acute systolic CHF exacerbation.  S/P extraction of 10 teeth on 11/22. Diet has been advanced to dysphagia 3 with thin liquids. Meal completions: 100% of all meals. Also being offered Ensure Enlive TID between meals. Nutrition needs are being met with intake of meals and supplements.   Patient reports good intake of meals. He is drinking 2-3 Ensure supplements per day.   Plans for mitral valve replacement 12/1.   Admission weight 72.6 kg (11/10) Weight 71 kg today  Labs reviewed. Na 132  Medications reviewed and include MVI with minerals, Protonix, Aldactone.  Diet Order:   Diet Order             DIET DYS 3 Room service appropriate? Yes; Fluid consistency: Thin  Diet effective now                   EDUCATION NEEDS:   No education needs have been identified at this time  Skin:  Skin Assessment: Reviewed RN Assessment  Last BM:  11/28  Height:   Ht Readings from Last 1 Encounters:  02/04/21 5' 11" (1.803 m)    Weight:   Wt Readings from Last 1 Encounters:  02/15/21 71 kg     BMI:  Body mass index is 21.83 kg/m.  Estimated Nutritional Needs:   Kcal:  2000-2200  Protein:  100-110 grams  Fluid:  >/=2L    Kimberly H, RD, LDN, CNSC Please refer to Amion for contact information.                                                        

## 2021-02-15 NOTE — Progress Notes (Signed)
ANTICOAGULATION CONSULT NOTE  Pharmacy Consult heparin Indication: pre-existing DVT  Allergies  Allergen Reactions   Iodide Rash    Patient Measurements: Height: 5\' 11"  (180.3 cm) Weight: 70 kg (154 lb 6.4 oz) IBW/kg (Calculated) : 75.3  Heparin Dosing Weight: 72.6 kg  Vital Signs: Temp: 98.4 F (36.9 C) (11/29 0355) Temp Source: Oral (11/29 0355) BP: 132/86 (11/29 0355) Pulse Rate: 77 (11/29 0355)  Labs: Recent Labs    02/13/21 0500 02/14/21 0500 02/15/21 0500  HGB 11.5* 10.4*  --   HCT 38.1* 36.7*  --   PLT 182 174  --   HEPARINUNFRC 0.43 0.40 0.24*  CREATININE 1.22 1.02 1.09    Estimated Creatinine Clearance: 87.4 mL/min (by C-G formula based on SCr of 1.09 mg/dL).  Assessment: 40 y.o. male with medical history significant for asthma/COPD, recent DVT, tobacco abuse who presented with 3 days SOB and CP. CT showed no evidence of pulmonary embolism. 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 Pharmacy consulted to start heparin infusion.   Heparin level down to subtherapeutic (0.24) on 1450 units/hr. No issues with IV infusion or bleeding noted per RN.   Goal of Therapy:  Heparin level 0.3-0.7 units/ml Monitor platelets by anticoagulation protocol: Yes   Plan:  Increase heparin to 1600 units/hr  F/u 6 hr heparin level  45, PharmD, BCPS Please see amion for complete clinical pharmacist phone list  02/15/2021 5:42 AM

## 2021-02-15 NOTE — Progress Notes (Signed)
ANTICOAGULATION CONSULT NOTE  Pharmacy Consult heparin Indication: pre-existing DVT  Allergies  Allergen Reactions   Iodide Rash    Patient Measurements: Height: 5\' 11"  (180.3 cm) Weight: 71 kg (156 lb 8.4 oz) IBW/kg (Calculated) : 75.3  Heparin Dosing Weight: 72.6 kg  Vital Signs: Temp: 98.2 F (36.8 C) (11/29 2003) Temp Source: Oral (11/29 2003) BP: 98/78 (11/29 2003) Pulse Rate: 75 (11/29 2003)  Labs: Recent Labs    02/13/21 0500 02/14/21 0500 02/15/21 0500 02/15/21 1149 02/15/21 1922  HGB 11.5* 10.4* 10.5*  --   --   HCT 38.1* 36.7* 35.5*  --   --   PLT 182 174 173  --   --   HEPARINUNFRC 0.43 0.40 0.24* 0.27* 0.32  CREATININE 1.22 1.02 1.09  --   --     Estimated Creatinine Clearance: 88.7 mL/min (by C-G formula based on SCr of 1.09 mg/dL).  Assessment: 40 y.o. male with medical history significant for asthma/COPD, recent DVT, tobacco abuse who presented with 3 days SOB and CP. CT showed no evidence of pulmonary embolism. 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 Pharmacy consulted to start heparin infusion.   Heparin level 0.32 now at goal after rate increase to 1800 units/hr. CBC stable.  No issues with IV infusion or bleeding noted.   Goal of Therapy:  Heparin level 0.3-0.7 units/ml Monitor platelets by anticoagulation protocol: Yes   Plan:  Continue heparin at 1800 units/hr  Daily heparin level, CBC  45 PharmD., BCPS Clinical Pharmacist 02/15/2021 8:25 PM

## 2021-02-15 NOTE — Progress Notes (Signed)
ANTICOAGULATION CONSULT NOTE  Pharmacy Consult heparin Indication: pre-existing DVT  Allergies  Allergen Reactions   Iodide Rash    Patient Measurements: Height: 5\' 11"  (180.3 cm) Weight: 71 kg (156 lb 8.4 oz) IBW/kg (Calculated) : 75.3  Heparin Dosing Weight: 72.6 kg  Vital Signs: Temp: 98.3 F (36.8 C) (11/29 1243) Temp Source: Oral (11/29 1243) BP: 107/74 (11/29 1243) Pulse Rate: 71 (11/29 1243)  Labs: Recent Labs    02/13/21 0500 02/14/21 0500 02/15/21 0500 02/15/21 1149  HGB 11.5* 10.4* 10.5*  --   HCT 38.1* 36.7* 35.5*  --   PLT 182 174 173  --   HEPARINUNFRC 0.43 0.40 0.24* 0.27*  CREATININE 1.22 1.02 1.09  --     Estimated Creatinine Clearance: 88.7 mL/min (by C-G formula based on SCr of 1.09 mg/dL).  Assessment: 40 y.o. male with medical history significant for asthma/COPD, recent DVT, tobacco abuse who presented with 3 days SOB and CP. CT showed no evidence of pulmonary embolism. 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 Pharmacy consulted to start heparin infusion.   Heparin level 0.27 still subtherapeutic after rate increase to 1600 units/hr. CBC stable.  No issues with IV infusion or bleeding noted per RN. Level drawn appropriately.  Goal of Therapy:  Heparin level 0.3-0.7 units/ml Monitor platelets by anticoagulation protocol: Yes   Plan:  Increase heparin to 1800 units/hr  6 hr heparin level Daily heparin level, CBC  45, PharmD PGY1 Pharmacy Resident 02/15/2021  1:09 PM  Please check [email or web address] for unit-specific pharmacy phone numbers.

## 2021-02-15 NOTE — Progress Notes (Signed)
Physical Therapy Treatment Patient Details Name: Mark Garrett MRN: [id number] DOB: 1980/11/19 Today's Date: 02/15/2021   History of Present Illness Pt is a 40 y.o. male admitted 01/27/21 due to CHF exacerbation. S/p R/L heart cath and coronary graft angiography on 11/14. 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 Plan for MVR on 12/1. PMH includes CHF, CAD (s/p CABG 2006), mitral valve disease, traumatic amputation of L foot.   PT Comments    Pt progressing well with mobility. Preparing for MVR on 02/17/21; will follow-up for PT Re-Evaluation as appropriate post-op. Pt with good ability to incorporate sternal precautions with activity in preparation for procedure, intermittent cues and education. Encouraged continued ambulation with mobility specialists and cardiac rehab.    Recommendations for follow up therapy are one component of a multi-disciplinary discharge planning process, led by the attending physician.  Recommendations may be updated based on patient status, additional functional criteria and insurance authorization.  Follow Up Recommendations  Other (comment) (TBD post-op)     Assistance Recommended at Discharge    Equipment Recommendations  None recommended by PT    Recommendations for Other Services       Precautions / Restrictions Precautions Precautions: Fall;Other (comment) Precaution Comments: h/o L forefoot amputation (does not have prosthetic in room; ambulates with/without it at home); initiated educ on sternal precautions in preparation for MVR     Mobility  Bed Mobility Overal bed mobility: Independent Bed Mobility: Supine to Sit           General bed mobility comments: Educ on log roll for "sternal precautions" and recommendation to practice, pt still opting to power into long sitting from flat bed, indep with this    Transfers Overall transfer level: Needs assistance Equipment used: None Transfers:  Sit to/from Stand Sit to Stand: Supervision           General transfer comment: Supervision for lines; able to stand without use of UEs    Ambulation/Gait Ambulation/Gait assistance: Supervision Gait Distance (Feet): 280 Feet Assistive device: IV Pole Gait Pattern/deviations: Step-through pattern;Decreased stride length Gait velocity: Decreased     General Gait Details: Slow, steady gait without DME, good ability to manage IV pole; supervision for safety/lines; pt ambulates on residual limb (does not have prosthetic)   Stairs Stairs: Yes Stairs assistance: Supervision Stair Management: One rail Right;Step to pattern;Forwards Number of Stairs: 6 General stair comments: up/down 2 steps x 3 (due to lines);  supervision for safety; 1x instability requiring rail support to maintain balance   Wheelchair Mobility    Modified Rankin (Stroke Patients Only)       Balance Overall balance assessment: Needs assistance Sitting-balance support: No upper extremity supported;Feet supported Sitting balance-Leahy Scale: Normal     Standing balance support: No upper extremity supported;During functional activity Standing balance-Leahy Scale: Good                              Cognition Arousal/Alertness: Awake/alert Behavior During Therapy: WFL for tasks assessed/performed;Flat affect Overall Cognitive Status: Within Functional Limits for tasks assessed                                          Exercises Other Exercises Other Exercises: [employer] HEP handout provided and educ on - partial squats with UE support, single leg calf  raise with UE support, SLR    General Comments General comments (skin integrity, edema, etc.): Reviewed sternal precautions; found pt's educ packet in room with these for him to review pre-op; also found handout from OT therex and encouraged this. Educ pt to continue mobilizing with mobility specialists and cardiac rehab; PT to  follow-up post-op      Pertinent Vitals/Pain Pain Assessment: No/denies pain    Home Living                          Prior Function            PT Goals (current goals can now be found in the care plan section) Progress towards PT goals: Progressing toward goals    Frequency    Min 2X/week      PT Plan Current plan remains appropriate    Co-evaluation              AM-PAC PT "6 Clicks" Mobility   Outcome Measure  Help needed turning from your back to your side while in a flat bed without using bedrails?: None Help needed moving from lying on your back to sitting on the side of a flat bed without using bedrails?: None Help needed moving to and from a bed to a chair (including a wheelchair)?: A Little Help needed standing up from a chair using your arms (e.g., wheelchair or bedside chair)?: A Little Help needed to walk in hospital room?: A Little Help needed climbing 3-5 steps with a railing? : A Little 6 Click Score: 20    End of Session   Activity Tolerance: Patient tolerated treatment well Patient left: in bed;with call bell/phone within reach;with nursing/sitter in room Nurse Communication: Mobility status PT Visit Diagnosis: Unsteadiness on feet (R26.81);Muscle weakness (generalized) (M62.81)     Time: [id number] PT Time Calculation (min) (ACUTE ONLY): 18 min  Charges:  $Therapeutic Exercise: 8-22 mins                     Mabeline Caras, PT, DPT Acute Rehabilitation Services  Pager [phone] Office [place] 02/15/2021, 2:13 PM

## 2021-02-16 ENCOUNTER — Inpatient Hospital Stay (HOSPITAL_COMMUNITY): Payer: Self-pay

## 2021-02-16 DIAGNOSIS — I340 Nonrheumatic mitral (valve) insufficiency: Secondary | ICD-10-CM

## 2021-02-16 LAB — BASIC METABOLIC PANEL
Anion gap: 7 (ref 5–15)
BUN: 19 mg/dL (ref 6–20)
CO2: 24 mmol/L (ref 22–32)
Calcium: 7.8 mg/dL — ABNORMAL LOW (ref 8.9–10.3)
Chloride: 102 mmol/L (ref 98–111)
Creatinine, Ser: 0.76 mg/dL (ref 0.61–1.24)
GFR, Estimated: >60 mL/min (ref >60)
Glucose, Bld: 89 mg/dL (ref 70–99)
Potassium: 3.6 mmol/L (ref 3.5–5.1)
Sodium: 133 mmol/L — ABNORMAL LOW (ref 135–145)

## 2021-02-16 LAB — URINALYSIS, ROUTINE W REFLEX MICROSCOPIC
Bilirubin Urine: NEGATIVE
Glucose, UA: >=500 mg/dL — AB
Hgb urine dipstick: NEGATIVE
Ketones, ur: NEGATIVE mg/dL
Leukocytes,Ua: NEGATIVE
Nitrite: NEGATIVE
Protein, ur: 30 mg/dL — AB
Specific Gravity, Urine: 1.015 (ref 1.005–1.030)
pH: 7 (ref 5.0–8.0)

## 2021-02-16 LAB — CBC
HCT: 31 % — ABNORMAL LOW (ref 39.0–52.0)
Hemoglobin: 9.3 g/dL — ABNORMAL LOW (ref 13.0–17.0)
MCH: 23.2 pg — ABNORMAL LOW (ref 26.0–34.0)
MCHC: 30 g/dL (ref 30.0–36.0)
MCV: 77.3 fL — ABNORMAL LOW (ref 80.0–100.0)
Platelets: 159 10*3/uL (ref 150–400)
RBC: 4.01 MIL/uL — ABNORMAL LOW (ref 4.22–5.81)
RDW: 31.5 % — ABNORMAL HIGH (ref 11.5–15.5)
WBC: 13 10*3/uL — ABNORMAL HIGH (ref 4.0–10.5)
nRBC: 0 % (ref 0.0–0.2)

## 2021-02-16 LAB — URINALYSIS, MICROSCOPIC (REFLEX)

## 2021-02-16 LAB — HEPARIN LEVEL (UNFRACTIONATED): Heparin Unfractionated: 0.55 IU/mL (ref 0.30–0.70)

## 2021-02-16 LAB — COOXEMETRY PANEL
Carboxyhemoglobin: 2.3 % — ABNORMAL HIGH (ref 0.5–1.5)
Methemoglobin: 0.6 % (ref 0.0–1.5)
O2 Saturation: 97 %
Total hemoglobin: 11.4 g/dL — ABNORMAL LOW (ref 12.0–16.0)

## 2021-02-16 LAB — SURGICAL PCR SCREEN
MRSA, PCR: NEGATIVE
Staphylococcus aureus: NEGATIVE

## 2021-02-16 LAB — MAGNESIUM: Magnesium: 2 mg/dL (ref 1.7–2.4)

## 2021-02-16 LAB — ABO/RH: ABO/RH(D): A NEG

## 2021-02-16 MED ORDER — TRANEXAMIC ACID (OHS) BOLUS VIA INFUSION
15.0000 mg/kg | INTRAVENOUS | Status: AC
  Administered 2021-02-17: 1048.5 mg via INTRAVENOUS
  Filled 2021-02-16: qty 1049

## 2021-02-16 MED ORDER — TEMAZEPAM 15 MG PO CAPS
15.0000 mg | ORAL_CAPSULE | Freq: Once | ORAL | Status: AC | PRN
  Administered 2021-02-16: 15 mg via ORAL
  Filled 2021-02-16: qty 1

## 2021-02-16 MED ORDER — BISACODYL 5 MG PO TBEC
5.0000 mg | DELAYED_RELEASE_TABLET | Freq: Once | ORAL | Status: AC
  Administered 2021-02-16: 5 mg via ORAL
  Filled 2021-02-16: qty 1

## 2021-02-16 MED ORDER — PLASMA-LYTE A IV SOLN
INTRAVENOUS | Status: DC
  Filled 2021-02-16: qty 5

## 2021-02-16 MED ORDER — TRANEXAMIC ACID 1000 MG/10ML IV SOLN
1.5000 mg/kg/h | INTRAVENOUS | Status: AC
  Administered 2021-02-17: 1.5 mg/kg/h via INTRAVENOUS
  Filled 2021-02-16: qty 25

## 2021-02-16 MED ORDER — CHLORHEXIDINE GLUCONATE CLOTH 2 % EX PADS
6.0000 | MEDICATED_PAD | Freq: Once | CUTANEOUS | Status: AC
  Administered 2021-02-17: 6 via TOPICAL

## 2021-02-16 MED ORDER — METOPROLOL TARTRATE 12.5 MG HALF TABLET
12.5000 mg | ORAL_TABLET | Freq: Once | ORAL | Status: AC
  Administered 2021-02-17: 12.5 mg via ORAL
  Filled 2021-02-16: qty 1

## 2021-02-16 MED ORDER — NOREPINEPHRINE 4 MG/250ML-% IV SOLN
0.0000 ug/min | INTRAVENOUS | Status: AC
  Administered 2021-02-17: 2 ug/min via INTRAVENOUS
  Filled 2021-02-16: qty 250

## 2021-02-16 MED ORDER — HEPARIN 30,000 UNITS/1000 ML (OHS) CELLSAVER SOLUTION
Status: DC
  Filled 2021-02-16: qty 1000

## 2021-02-16 MED ORDER — TRANEXAMIC ACID (OHS) PUMP PRIME SOLUTION
2.0000 mg/kg | INTRAVENOUS | Status: DC
  Filled 2021-02-16: qty 1.4

## 2021-02-16 MED ORDER — MILRINONE LACTATE IN DEXTROSE 20-5 MG/100ML-% IV SOLN
0.3000 ug/kg/min | INTRAVENOUS | Status: DC
  Filled 2021-02-16: qty 100

## 2021-02-16 MED ORDER — DEXMEDETOMIDINE HCL IN NACL 400 MCG/100ML IV SOLN
0.1000 ug/kg/h | INTRAVENOUS | Status: AC
  Administered 2021-02-17: .7 ug/kg/h via INTRAVENOUS
  Filled 2021-02-16: qty 100

## 2021-02-16 MED ORDER — CEFAZOLIN SODIUM-DEXTROSE 2-4 GM/100ML-% IV SOLN
2.0000 g | INTRAVENOUS | Status: AC
  Administered 2021-02-17: 2 g via INTRAVENOUS
  Filled 2021-02-16: qty 100

## 2021-02-16 MED ORDER — VANCOMYCIN HCL 1250 MG/250ML IV SOLN
1250.0000 mg | INTRAVENOUS | Status: AC
  Administered 2021-02-17: 1250 mg via INTRAVENOUS
  Filled 2021-02-16: qty 250

## 2021-02-16 MED ORDER — POTASSIUM CHLORIDE CRYS ER 20 MEQ PO TBCR
40.0000 meq | EXTENDED_RELEASE_TABLET | Freq: Once | ORAL | Status: AC
  Administered 2021-02-16: 40 meq via ORAL
  Filled 2021-02-16: qty 2

## 2021-02-16 MED ORDER — POTASSIUM CHLORIDE 2 MEQ/ML IV SOLN
80.0000 meq | INTRAVENOUS | Status: DC
  Filled 2021-02-16: qty 40

## 2021-02-16 MED ORDER — MAGNESIUM SULFATE 50 % IJ SOLN
40.0000 meq | INTRAMUSCULAR | Status: DC
  Filled 2021-02-16: qty 9.85

## 2021-02-16 MED ORDER — PHENYLEPHRINE HCL-NACL 20-0.9 MG/250ML-% IV SOLN
30.0000 ug/min | INTRAVENOUS | Status: AC
  Administered 2021-02-17: 25 ug/min via INTRAVENOUS
  Filled 2021-02-16: qty 250

## 2021-02-16 MED ORDER — EPINEPHRINE HCL 5 MG/250ML IV SOLN IN NS
0.0000 ug/min | INTRAVENOUS | Status: AC
  Administered 2021-02-17: 2 ug/min via INTRAVENOUS
  Filled 2021-02-16: qty 250

## 2021-02-16 MED ORDER — INSULIN REGULAR(HUMAN) IN NACL 100-0.9 UT/100ML-% IV SOLN
INTRAVENOUS | Status: AC
  Administered 2021-02-17: .6 [IU]/h via INTRAVENOUS
  Filled 2021-02-16: qty 100

## 2021-02-16 MED ORDER — DIAZEPAM 5 MG PO TABS
5.0000 mg | ORAL_TABLET | Freq: Once | ORAL | Status: AC
  Administered 2021-02-17: 5 mg via ORAL
  Filled 2021-02-16: qty 1

## 2021-02-16 MED ORDER — NITROGLYCERIN IN D5W 200-5 MCG/ML-% IV SOLN
2.0000 ug/min | INTRAVENOUS | Status: DC
  Filled 2021-02-16: qty 250

## 2021-02-16 MED ORDER — MANNITOL 20 % IV SOLN
INTRAVENOUS | Status: DC
  Filled 2021-02-16: qty 13

## 2021-02-16 MED ORDER — CHLORHEXIDINE GLUCONATE CLOTH 2 % EX PADS
6.0000 | MEDICATED_PAD | Freq: Once | CUTANEOUS | Status: AC
  Administered 2021-02-16: 6 via TOPICAL

## 2021-02-16 MED ORDER — CHLORHEXIDINE GLUCONATE 0.12 % MT SOLN
15.0000 mL | Freq: Once | OROMUCOSAL | Status: AC
  Administered 2021-02-17: 15 mL via OROMUCOSAL
  Filled 2021-02-16: qty 15

## 2021-02-16 NOTE — Progress Notes (Signed)
8 Days Post-Op Procedure(s) (LRB): MULTIPLE EXTRACTION WITH ALVEOLOPLASTY (N/A) Subjective: No complaints  Objective: Vital signs in last 24 hours: Temp:  [98.2 F (36.8 C)-98.8 F (37.1 C)] 98.7 F (37.1 C) (11/30 0803) Pulse Rate:  [71-81] 73 (11/30 0803) Cardiac Rhythm: Normal sinus rhythm (11/29 1900) Resp:  [14-18] 18 (11/30 0803) BP: (94-140)/(70-91) 133/91 (11/30 0803) SpO2:  [96 %-100 %] 100 % (11/30 0803) Weight:  [69.9 kg] 69.9 kg (11/30 0504)  Hemodynamic parameters for last 24 hours: CVP:  [2 mmHg-6 mmHg] 5 mmHg  Intake/Output from previous day: 11/29 0701 - 11/30 0700 In: 1465.4 [P.O.:1035; I.V.:330.4; IV Piggyback:100] Out: 4250 [Urine:4250] Intake/Output this shift: Total I/O In: -  Out: 300 [Urine:300]  General appearance: alert and cooperative Neurologic: intact Heart: regular rate and rhythm and 3/6 systolic murmur LLSB Lungs: clear to auscultation bilaterally  Lab Results: Recent Labs    02/15/21 0500 02/16/21 0500  WBC 16.6* 13.0*  HGB 10.5* 9.3*  HCT 35.5* 31.0*  PLT 173 159   BMET:  Recent Labs    02/15/21 0500 02/16/21 0500  NA 132* 133*  K 4.2 3.6  CL 97* 102  CO2 27 24  GLUCOSE 90 89  BUN 19 19  CREATININE 1.09 0.76  CALCIUM 9.1 7.8*    PT/INR: No results for input(s): LABPROT, INR in the last 72 hours. ABG    Component Value Date/Time   HCO3 34.1 (H) 01/31/2021 1329   TCO2 36 (H) 01/31/2021 1329   O2SAT 60.8 02/15/2021 0455   CBG (last 3)  No results for input(s): GLUCAP in the last 72 hours.  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 He is stable on milrinone s/p dental extractions. Plan to proceed with surgery tomorrow. I would use a mechanical valve at his age and this being a redo surgery. A bioprosthetic valve would likely deteriorate within 10 years in his age group. He feels like he could take Coumadin reliably and maintain follow up.  LOS: 20 days    Alleen Borne 02/16/2021

## 2021-02-16 NOTE — Progress Notes (Signed)
Patient ID: Mark Garrett, male   DOB: 05/12/1980, 40 y.o.   MRN: [id number]      Advanced Heart Failure Rounding Note  PCP-Cardiologist: None   Subjective:    No co-ox this morning, on 0.375 milrinone. CVP 6.   Denies SOB. Walked down the hall without difficulty yesterday..  TEE (11/16): EF 35-40%, there was mobile vegetation on both the anterior and posterior mitral leaflets.  The leaflets did not completely coapt (?destruction from endocarditis) with severe mitral regurgitation (ERO 0.56 cm^2).  There was extensive plaque in the proximal descending thoracic aorta with what appears to be mobile vegetation attached to it.   LHC/RHC:   Coronary Findings  Diagnostic Dominance: Right Left Anterior Descending  Mid LAD lesion is 95% stenosed.  Ramus Intermedius  Ramus-1 lesion is 50% stenosed.  Ramus-2 lesion is 80% stenosed.  Right Coronary Artery  Mid RCA lesion is 30% stenosed.  Graft To PLV  Origin lesion is 100% stenosed.  LIMA Graft To Dist LAD  Intervention  No interventions have been documented. Right Heart  Right Heart Pressures RHC Procedural Findings: Hemodynamics (mmHg) on milrinone 0.375 RA mean 18 RV 69/20 PA 76/25, mean 44 PCWP mean 22, v-waves to 37 mmHg LV 100/23 AO 100/60  Oxygen saturations: PA 58% AO 97%  Cardiac Output (Fick) 5.33  Cardiac Index (Fick) 2.67 PVR 4.1 WU  Cardiac Output (Thermo) 4.89 Cardiac Index (Thermo) 2.45  PVR 4.5 WU  PAPI: 2.8    Objective:   Weight Range: 69.9 kg Body mass index is 21.49 kg/m.   Vital Signs:   Temp:  [98.2 F (36.8 C)-98.8 F (37.1 C)] 98.7 F (37.1 C) (11/30 0803) Pulse Rate:  [71-81] 73 (11/30 0803) Resp:  [14-18] 18 (11/30 0803) BP: (94-140)/(70-91) 133/91 (11/30 0803) SpO2:  [96 %-100 %] 100 % (11/30 0803) Weight:  [69.9 kg] 69.9 kg (11/30 0504) Last BM Date: 02/15/21  Weight change: Filed Weights   02/14/21 0323 02/15/21 0824 02/16/21 0504  Weight: 70 kg 71 kg 69.9 kg     Intake/Output:   Intake/Output Summary (Last 24 hours) at 02/16/2021 0837 Last data filed at 02/16/2021 0804 Gross per 24 hour  Intake 1465.35 ml  Output 4550 ml  Net -3084.65 ml      Physical Exam  CVP 6 General: NAD Neck: No JVD, no thyromegaly or thyroid nodule.  Lungs: Clear to auscultation bilaterally with normal respiratory effort. CV: Nondisplaced PMI.  Heart regular S1/S2, no S3/S4, 3/6 HSM apex.  No peripheral edema.   Abdomen: Soft, nontender, no hepatosplenomegaly, no distention.  Skin: Intact without lesions or rashes.  Neurologic: Alert and oriented x 3.  Psych: Normal affect. Extremities: s/p traumatic amputation left foot.  HEENT: Normal.   Telemetry   SR 70s personally reviewed.    Labs    CBC Recent Labs    02/15/21 0500 02/16/21 0500  WBC 16.6* 13.0*  HGB 10.5* 9.3*  HCT 35.5* 31.0*  MCV 76.5* 77.3*  PLT 173 532   Basic Metabolic Panel Recent Labs    02/15/21 0500 02/16/21 0500  NA 132* 133*  K 4.2 3.6  CL 97* 102  CO2 27 24  GLUCOSE 90 89  BUN 19 19  CREATININE 1.09 0.76  CALCIUM 9.1 7.8*  MG 2.2 2.0   Liver Function Tests No results for input(s): AST, ALT, ALKPHOS, BILITOT, PROT, ALBUMIN in the last 72 hours.  No results for input(s): LIPASE, AMYLASE in the last 72 hours. Cardiac Enzymes No results for  input(s): CKTOTAL, CKMB, CKMBINDEX, TROPONINI in the last 72 hours.   BNP: BNP (last 3 results) Recent Labs    01/27/21 1034  BNP 1,733.0*    ProBNP (last 3 results) No results for input(s): PROBNP in the last 8760 hours.   D-Dimer No results for input(s): DDIMER in the last 72 hours. Hemoglobin A1C No results for input(s): HGBA1C in the last 72 hours. Fasting Lipid Panel No results for input(s): CHOL, HDL, LDLCALC, TRIG, CHOLHDL, LDLDIRECT in the last 72 hours.  Thyroid Function Tests No results for input(s): TSH, T4TOTAL, T3FREE, THYROIDAB in the last 72 hours.  Invalid input(s): FREET3  Other  results:   Imaging    No results found.   Medications:     Scheduled Medications:  amiodarone  200 mg Oral BID   aspirin  81 mg Oral Daily   Chlorhexidine Gluconate Cloth  6 each Topical Daily   dapagliflozin propanediol  10 mg Oral Daily   dextromethorphan-guaiFENesin  1 tablet Oral BID   digoxin  0.125 mg Oral Daily   feeding supplement  237 mL Oral TID BM   Living Better with Heart Failure Book   Does not apply Once   multivitamin with minerals  1 tablet Oral Daily   nicotine  21 mg Transdermal QHS   pantoprazole  40 mg Oral Daily   rosuvastatin  40 mg Oral Daily   sodium chloride flush  10-40 mL Intracatheter Q12H   sodium chloride flush  3 mL Intravenous Q12H   sodium chloride flush  3 mL Intravenous Q12H   spironolactone  25 mg Oral Daily   Thrombi-Pad  1 each Topical Once   torsemide  10 mg Oral Daily    Infusions:  sodium chloride     sodium chloride Stopped (01/31/21 2357)   cefTRIAXone (ROCEPHIN)  IV 2 g (02/15/21 1419)   DAPTOmycin (CUBICIN)  IV 650 mg (02/15/21 2126)   heparin 1,800 Units/hr (02/15/21 2254)   magnesium sulfate bolus IVPB     milrinone 0.375 mcg/kg/min (02/16/21 0300)    PRN Medications: sodium chloride, Place/Maintain arterial line **AND** sodium chloride, acetaminophen, magnesium hydroxide, ondansetron (ZOFRAN) IV, sodium chloride flush, sodium chloride flush    Assessment/Plan   1. Acute systolic CHF: Ischemic cardiomyopathy, symptoms worsened by severe MR.  Echo with EF 35-40%, severe hypokinesis inferior and inferolateral walls, moderate LV dilation with mild LVH, mild RV dilation with moderately decreased systolic function, restricted posterior mitral leaflet and calcified mitral valve with severe mitral regurgitation and at least mild mitral stenosis (mean gradient 8 mmHg), PASP 60. Per report, echo at [facility] showed EF 55-60% with severe MR in 10/22.  It was a technically difficult study per report.  It is possible that he had a  new ACS event prior to admission with reported drop in EF though HS-TnI with mild elevation and no trend suggests that it was not immediately prior to admission.  Low output noted with co-ox 39% initially, lactate elevated when he was initially admitted.  Currently on milrinone 0.375 with good co-ox consistently.  CVP 6. Creatinine 0.76.  - Continue milrinone 0.375.    - Continue digoxin 0.125 daily.  - Continue torsemide 10 mg daily.  - Continue spironolactone 25 mg daily.  - Continue Farxiga 10 mg daily.  2. CAD: S/p CABG 2006.  As above, with fall in EF and CHF exacerbation, possible ACS prior to admission.  However, mild HS-TnI elevation with no trend suggests that ACS was not immediately prior to  admission.  Current elevation is likely demand ischemia from volume overload. LHC on 11/14 showed patent LIMA-LAD with SVG occluded at aorta (does not look new); there was complex 80% proximal ramus stenosis.  Initially assumed that the SVG was to the ramus, but subsequently got the CABG operative report from [place] and it looks like it was an SVG-PLV.  There was minimal native RCA disease. LAD territory well-supplied by LIMA. No chest pain.   - Continue heparin gtt while off Xarelto.  - ASA 81  - Crestor 40 - Will need SVG-ramus with valve surgery.   3.  Mitral valve disease: Initial echo showed posterior MV leaflet restricted with severe MR, suspected primarily infarct-related MR given LV dilation and inferior/inferolateral severe hypokinesis.  However, TEE on 11/16 showed vegetation (not bulky but clearly present) on the posterior and anterior leaflets with poor leaflet coaptation, suggesting endocarditis may be the major cause of severe MR.  Reviewed with ID, suspect the mitral vegetations have been present for a while (severe MR on 10/22 echo as well, does not appear to have active infection).  Cannot rule out nonbacterial thrombotic endocarditis (had recent DVT also), but somewhat academic at this  point as he will be on anticoagulation for the non-triggered DVT and we are going to treat with antibiotic course given presentation and high WBCs. ANA negative.  - Seen by Dr. Cyndia Bent, will need mitral valve replacement, scheduled for 12/1 (tomorrow).  Will keep NPO at midnight. Discussed surgery with patient today.  - Had dental surgery to remove decayed teeth pre-op.   4. 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  ?due to sedentary lifestyle + ?genetic predisposition.  He is not a Xarelto failure, had DVT found in 10/22 with Xarelto started at this point, 11/22 dopplers showed partially treated DVTs.  - Was on Xarelto PTA, now heparin gtt.  Restart oral anticoagulation prior to discharge.  5. Smoking: Needs to quit.  6. PNA: Cultures 11/18 NGTD.  RML PNA on CT chest.  - He completed course of abx initially for PNA.  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 He is able to walk on the stump.  - PT following, continue to mobilize => walk daily in hall.  8. 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  There is also mobile vegetation that appears adherent to plaque in the proximal descending thoracic aorta.  CT head showed no evidence for embolic disease.  Possible source would be due to poor dentition. Blood cultures from 11/10 and 11/16 NGTD.  He was initially on abx for PNA.  We re-drew cultures on 11/16 and 11/24, all negative so far.  ESR is 1, CRP 1.7.  He had been already partially treated when we found the endocarditis (abx for PNA) and suspect that the endocarditis is old as he had severe MR also on echo in 10/22. ID following.  WBCs rose to >30 after teeth removed but now coming back down (13 today ).  Suspect worsening leukocytosis was a response to dental surgery. Afebrile. ID has seen, adjusted abx.  - Per ID, continue 6 wks of IV abx to 12/27 =>ceftriaxone and daptomycin.    - Will need MV sent for tissue culture when surgery done.    9. Microcytic anemia: Stable hgb.  - Iron stores low, had Feraheme   Plan for surgery tomorrow. He lives alone and will need SNF after discharge. Will involve TOC.  Length of Stay: 20  Loralie Champagne, MD  02/16/2021, 8:37 AM  Advanced Heart Failure Team Pager [phone] (M-F; 7a - 5p)  Please contact [facility] for night-coverage after hours (5p -7a ) and weekends on [email or web address]

## 2021-02-16 NOTE — TOC Progression Note (Addendum)
Transition of Care [facility]) - Progression Note    Patient Details  Name: Mark Garrett MRN: [id number] Date of Birth: 11-17-1980  Transition of [facility]) CM/SW Contact  Charlita Brian, LCSWA Phone Number: 02/16/2021, 2:39 PM  Clinical Narrative:    Mark Garrett nurse requested the HF CSW reach out to the patients mom listed in the chart. HF CSW spoke with Mark Garrett mother, [name] [phone] and she reported that there is a protective order in place between her and her son since March or April and will be in effect for a year. CSW was unable to provide information to patient's mother due to not having permission from the patient. CSW allowed the patient's mother to provide social information regarding Mark Garrett including a drinking history and that the county may own his home as he was unable to make payments on the home.  CSW spoke with the [facility] supervisor Mark Garrett about doing charity DME at time of DC as Mark Garrett has no health insurance for SNF.  CSW will continue to follow throughout discharge.   Expected Discharge Plan: Home/Self Care Barriers to Discharge: Continued Medical Work up  Expected Discharge Plan and Services Expected Discharge Plan: Home/Self Care In-house Referral: Clinical Social Work Discharge Planning Services: CM Consult, Indigent Health Clinic, MATCH Program, Medication Assistance, Follow-up appt scheduled   Living arrangements for the past 2 months: Single Family Home                   DME Agency: NA       HH Arranged: NA           Social Determinants of Health (SDOH) Interventions Food Insecurity Interventions: Other (Comment) (Patient has food stamps $250 a month) Financial Strain Interventions: Intervention Not Indicated, Artist (referral to [employer] to screen for Medicaid, patient agreeable) Housing Interventions: Intervention Not Indicated Transportation Interventions: Retail banker  Readmission Risk  Interventions No flowsheet data found.  Christiana Gurevich, MSW, LCSWA [phone] Heart Failure Social Worker

## 2021-02-16 NOTE — Progress Notes (Signed)
Mobility Specialist Progress Note    02/16/21 1152  Mobility  Activity Ambulated in hall  Level of Assistance Independent  Assistive Device None  Distance Ambulated (ft) 300 ft  Mobility Ambulated independently in hallway  Mobility Response Tolerated well  Mobility performed by Mobility specialist  $Mobility charge 1 Mobility   Pt received in bed and agreeable. No complaints. Took x1 short standing break. Returned to bed with call bell in reach.   [facility] Mobility Specialist  M.S. Primary Phone: [phone] M.S. Secondary Phone: [phone]

## 2021-02-16 NOTE — H&P (View-Only) (Signed)
8 Days Post-Op Procedure(s) (LRB): MULTIPLE EXTRACTION WITH ALVEOLOPLASTY (N/A) Subjective: No complaints  Objective: Vital signs in last 24 hours: Temp:  [98.2 F (36.8 C)-98.8 F (37.1 C)] 98.7 F (37.1 C) (11/30 0803) Pulse Rate:  [71-81] 73 (11/30 0803) Cardiac Rhythm: Normal sinus rhythm (11/29 1900) Resp:  [14-18] 18 (11/30 0803) BP: (94-140)/(70-91) 133/91 (11/30 0803) SpO2:  [96 %-100 %] 100 % (11/30 0803) Weight:  [69.9 kg] 69.9 kg (11/30 0504)  Hemodynamic parameters for last 24 hours: CVP:  [2 mmHg-6 mmHg] 5 mmHg  Intake/Output from previous day: 11/29 0701 - 11/30 0700 In: 1465.4 [P.O.:1035; I.V.:330.4; IV Piggyback:100] Out: 4250 [Urine:4250] Intake/Output this shift: Total I/O In: -  Out: 300 [Urine:300]  General appearance: alert and cooperative Neurologic: intact Heart: regular rate and rhythm and 3/6 systolic murmur LLSB Lungs: clear to auscultation bilaterally  Lab Results: Recent Labs    02/15/21 0500 02/16/21 0500  WBC 16.6* 13.0*  HGB 10.5* 9.3*  HCT 35.5* 31.0*  PLT 173 159   BMET:  Recent Labs    02/15/21 0500 02/16/21 0500  NA 132* 133*  K 4.2 3.6  CL 97* 102  CO2 27 24  GLUCOSE 90 89  BUN 19 19  CREATININE 1.09 0.76  CALCIUM 9.1 7.8*    PT/INR: No results for input(s): LABPROT, INR in the last 72 hours. ABG    Component Value Date/Time   HCO3 34.1 (H) 01/31/2021 1329   TCO2 36 (H) 01/31/2021 1329   O2SAT 60.8 02/15/2021 0455   CBG (last 3)  No results for input(s): GLUCAP in the last 72 hours.  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 He is stable on milrinone s/p dental extractions. Plan to proceed with surgery tomorrow. I would use a mechanical valve at his age and this being a redo surgery. A bioprosthetic valve would likely deteriorate within 10 years in his age group. He feels like he could take Coumadin reliably and maintain follow up.  LOS: 20 days    Alleen Borne 02/16/2021

## 2021-02-16 NOTE — Progress Notes (Addendum)
ANTICOAGULATION CONSULT NOTE  Pharmacy Consult heparin Indication: pre-existing DVT  Allergies  Allergen Reactions   Iodide Rash    Patient Measurements: Height: 5\' 11"  (180.3 cm) Weight: 69.9 kg (154 lb 1.6 oz) IBW/kg (Calculated) : 75.3  Heparin Dosing Weight: 72.6 kg  Vital Signs: Temp: 98.7 F (37.1 C) (11/30 0803) Temp Source: Oral (11/30 0803) BP: 133/91 (11/30 0803) Pulse Rate: 73 (11/30 0803)  Labs: Recent Labs    02/14/21 0500 02/15/21 0500 02/15/21 1149 02/15/21 1922 02/16/21 0500  HGB 10.4* 10.5*  --   --  9.3*  HCT 36.7* 35.5*  --   --  31.0*  PLT 174 173  --   --  159  HEPARINUNFRC 0.40 0.24* 0.27* 0.32 0.55  CREATININE 1.02 1.09  --   --  0.76    Estimated Creatinine Clearance: 118.9 mL/min (by C-G formula based on SCr of 0.76 mg/dL).  Assessment: 40 y.o. male with medical history significant for asthma/COPD, recent DVT, tobacco abuse who presented with 3 days SOB and CP. CT showed no evidence of pulmonary embolism. 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 Pharmacy consulted to start heparin infusion.   Heparin level 0.55 therapeutic at 1800 units/hr. Hgb down (10.5>9.3), plt stable and no issues with IV infusion or bleeding noted.   Goal of Therapy:  Heparin level 0.3-0.7 units/ml Monitor platelets by anticoagulation protocol: Yes   Plan:  Continue heparin at 1800 units/hr  Daily heparin level, CBC F/u switch to PO anticoagulation after MVR tomorrow  45, PharmD PGY1 Pharmacy Resident 02/16/2021  8:35 AM  Please check [email or web address] for unit-specific pharmacy phone numbers.

## 2021-02-16 NOTE — Anesthesia Preprocedure Evaluation (Addendum)
Anesthesia Evaluation  Patient identified by MRN, date of birth, ID band Patient awake    Reviewed: Allergy & Precautions, NPO status , Patient's Chart, lab work & pertinent test results, reviewed documented beta blocker date and time   Airway Mallampati: II  TM Distance: >3 FB Neck ROM: Full    Dental  (+) Dental Advisory Given, Missing,    Pulmonary asthma , COPD, Current Smoker and Patient abstained from smoking.,    Pulmonary exam normal breath sounds clear to auscultation       Cardiovascular + CAD, + Past MI, +CHF and + DVT  Normal cardiovascular exam+ Valvular Problems/Murmurs MR  Rhythm:Regular Rate:Normal  Echo 02/02/21 1. Left ventricular ejection fraction, by estimation, is 35 to 40%. The left ventricle has moderately decreased function. The left ventricle demonstrates regional wall motion abnormalities with inferior and inferolateral hypokinesis. The left ventricular internal cavity size was mildly dilated.  2. Right ventricular systolic function is mildly reduced. The right ventricular size is normal.  3. Peak RV-RA gradient 33 mmHg. No TV vegetation.  4. No pulmonary valve vegetation.  5. The aortic valve is tricuspid. Aortic valve regurgitation is trivial. No aortic stenosis is present. No AoV vegetation.  6. Left atrial size was moderately dilated. No left atrial/left atrial appendage thrombus was detected.  7. Right atrial size was moderately dilated.  8. No PFO or ASD by color doppler.  9. There was mobile vegetation on both the anterior and posterior mitral leaflets. The leaflets did not completely coapt (?destruction from endocarditis) with severe mitral regurgitation (ERO 0.56 cm^2). There was flattening but not flow reversal in the pulmonary vein systolic doppler pattern. No significant stenosis.  10. There was extensive plaque in the proximal descending thoracic aorta with what appears to be mobile  vegetation attached to it.   Cardiac Cath 01/31/21  Mid LAD lesion is 95% stenosed. .  Ramus-1 lesion is 50% stenosed. .  Ramus-2 lesion is 80% stenosed. .  Mid RCA lesion is 30% stenosed. .  Origin lesion is 100% stenosed.   Neuro/Psych negative neurological ROS     GI/Hepatic negative GI ROS, Neg liver ROS,   Endo/Other  negative endocrine ROS  Renal/GU negative Renal ROS     Musculoskeletal negative musculoskeletal ROS (+)   Abdominal   Peds  Hematology  (+) Blood dyscrasia (Xarelto), anemia ,   Anesthesia Other Findings   Reproductive/Obstetrics                            Anesthesia Physical Anesthesia Plan  ASA: 4  Anesthesia Plan: General   Post-op Pain Management:    Induction: Intravenous  PONV Risk Score and Plan: 1 and Midazolam  Airway Management Planned: Oral ETT  Additional Equipment: Arterial line, PA Cath, CVP, 3D TEE, TEE and Ultrasound Guidance Line Placement  Intra-op Plan:   Post-operative Plan: Post-operative intubation/ventilation  Informed Consent: I have reviewed the patients History and Physical, chart, labs and discussed the procedure including the risks, benefits and alternatives for the proposed anesthesia with the patient or authorized representative who has indicated his/her understanding and acceptance.     Dental advisory given  Plan Discussed with: CRNA  Anesthesia Plan Comments:        Anesthesia Quick Evaluation

## 2021-02-16 NOTE — Progress Notes (Signed)
Occupational Therapy Treatment Patient Details Name: Mark Garrett MRN: [id number] DOB: 02/17/1981 Today's Date: 02/16/2021   History of present illness Pt is a 40 y.o. male admitted 01/27/21 due to CHF exacerbation. S/p R/L heart cath and coronary graft angiography on 11/14. 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 Plan for MVR on 12/1. PMH includes CHF, CAD (s/p CABG 2006), mitral valve disease, traumatic amputation of L foot.   OT comments  Pt progressing well towards OT goals with pt able to complete bathing/dressing tasks standing at sink with no more than Setup Assist. Provided fall prevention education with handout due to pt hx of intermittent falls. Reinforced UE HEP using theraband with pt able to independently recall 2/4, min cues to recall the other 2/4 exercises. Recommended pt to cease theraband exercises after procedure until therapy follow-up. Will follow-up for OT re-eval after planned sx tomorrow. Pt verbalized understanding.   Recommendations for follow up therapy are one component of a multi-disciplinary discharge planning process, led by the attending physician.  Recommendations may be updated based on patient status, additional functional criteria and insurance authorization.    Follow Up Recommendations  No OT follow up    Assistance Recommended at Discharge PRN  Equipment Recommendations  None recommended by OT    Recommendations for Other Services      Precautions / Restrictions Precautions Precautions: Fall;Other (comment) Precaution Comments: h/o L forefoot amputation (does not have prosthetic in room; ambulates with/without it at home); initiated educ on sternal precautions in preparation for MVR Restrictions Weight Bearing Restrictions: No       Mobility Bed Mobility Overal bed mobility: Independent Bed Mobility: Supine to Sit;Sit to Supine     Supine to sit: Independent Sit to supine: Independent         Transfers Overall transfer level: Independent Equipment used: None Transfers: Sit to/from Stand Sit to Stand: Independent                 Balance Overall balance assessment: Needs assistance Sitting-balance support: No upper extremity supported;Feet supported Sitting balance-Leahy Scale: Normal     Standing balance support: No upper extremity supported;During functional activity Standing balance-Leahy Scale: Good Standing balance comment: able to mobilize short distances without AD                           ADL either performed or assessed with clinical judgement   ADL Overall ADL's : Needs assistance/impaired     Grooming: Independent;Standing;Wash/dry face;Oral care   Upper Body Bathing: Set up;Standing   Lower Body Bathing: Set up;Sit to/from stand Lower Body Bathing Details (indicate cue type and reason): able to stand on one LE to bring other LE up to wash Upper Body Dressing : Set up;Sitting Upper Body Dressing Details (indicate cue type and reason): assist with lines, but likely no issues if pt disconnected from IVs Lower Body Dressing: Set up;Sit to/from stand               Functional mobility during ADLs: Supervision/safety General ADL Comments: Guided pt in ADLs standing at sink without safety concerns and denial of SOB/fatigue. Provided fall prevention education (and handout) with pt actively engaged in education. Pt able to recall 2/4 UE exercises without cues. Recommended pt cease exercises s/p sx until therapy follow up    Extremity/Trunk Assessment Upper Extremity Assessment Upper Extremity Assessment: Overall WFL for tasks assessed   Lower Extremity Assessment  Lower Extremity Assessment: Defer to PT evaluation        Vision   Vision Assessment?: No apparent visual deficits   Perception     Praxis      Cognition Arousal/Alertness: Awake/alert Behavior During Therapy: WFL for tasks assessed/performed;Flat  affect Overall Cognitive Status: Within Functional Limits for tasks assessed                                            Exercises Exercises: General Upper Extremity General Exercises - Upper Extremity Shoulder Flexion: AROM;Strengthening;Both;Seated;Theraband;20 reps Theraband Level (Shoulder Flexion): Level 2 (Red) Shoulder Horizontal ABduction: AROM;Strengthening;Both;Seated;Theraband;20 reps Theraband Level (Shoulder Horizontal Abduction): Level 2 (Red) Elbow Flexion: AROM;Strengthening;Both;Seated;Theraband;20 reps Theraband Level (Elbow Flexion): Level 2 (Red) Elbow Extension: AROM;Strengthening;Both;Seated;Theraband;20 reps Theraband Level (Elbow Extension): Level 2 (Red)   Shoulder Instructions       General Comments      Pertinent Vitals/ Pain       Pain Assessment: No/denies pain  Home Living                                          Prior Functioning/Environment              Frequency  Min 2X/week        Progress Toward Goals  OT Goals(current goals can now be found in the care plan section)  Progress towards OT goals: Progressing toward goals  Acute Rehab OT Goals Patient Stated Goal: for surgery to go well OT Goal Formulation: With patient Time For Goal Achievement: 03/01/21 Potential to Achieve Goals: Good ADL Goals Pt Will Transfer to Toilet: Independently;ambulating Pt Will Perform Tub/Shower Transfer: Tub transfer;Independently;ambulating Pt/caregiver will Perform Home Exercise Program: Increased strength;Both right and left upper extremity;With theraband;Independently;With written HEP provided Additional ADL Goal #1: Pt to verbalize at least 3 fall prevention strategies Additional ADL Goal #2: Pt to increase standing tolerance >10 min during ADLs/IADLs to improve endurance  Plan Discharge plan remains appropriate    Co-evaluation                 AM-PAC OT "6 Clicks" Daily Activity     Outcome  Measure   Help from another person eating meals?: None Help from another person taking care of personal grooming?: None Help from another person toileting, which includes using toliet, bedpan, or urinal?: A Little Help from another person bathing (including washing, rinsing, drying)?: A Little Help from another person to put on and taking off regular upper body clothing?: None Help from another person to put on and taking off regular lower body clothing?: A Little 6 Click Score: 21    End of Session    OT Visit Diagnosis: Unsteadiness on feet (R26.81);Other abnormalities of gait and mobility (R26.89)   Activity Tolerance Patient tolerated treatment well   Patient Left in bed;with call bell/phone within reach   Nurse Communication          Time: [phone] OT Time Calculation (min): 26 min  Charges: OT General Charges $OT Visit: 1 Visit OT Treatments $Self Care/Home Management : 8-22 mins $Therapeutic Activity: 8-22 mins  Bradd Canary, OTR/L Acute Rehab Services Office: [phone]   Lorre Munroe 02/16/2021, 9:35 AM

## 2021-02-16 NOTE — Progress Notes (Signed)
Outpatient Heart Failure CSW received call back from pt aunt this morning.  She states she sees the patient a few times a month when he comes to visit.  She states that the pt house that he is living in is actually his old house but no longer belongs to him- it has been reposed by the county and he is squatting in it.  Does not have concerns with pt mental capacity- feels as if he can do for himself but that he is not motivated to- believes he is likely depressed due to his current situation.   CSW updated her on pt current medical situation and that there were concerns of where pt would stay once he is ready for DC from the hospital as he might not be able to go to SNF without insurance and it would not be a good idea for him to stay at his home without electricity and running water while he recovers.    Aunt said that he can stay with her temporarily after he is discharged from the hospital but that she does not have a bed for him.  States that he would need a hospital bed to be provided in order for him to stay with her- would have to stay in the living room as she lives in a 2 bedroom apartment with her fiance and grandson so there would not be a bedroom for him.  CSW updated inpatient team that they can reach out to aunt when they know more about pt needs at DC and projected DC date if he is appropriate to DC home with his aunt.  CSW will follow pt in outpatient setting and continue to assist as needed.  Burna Sis, LCSW Clinical Social Worker [facility] Desk#: [phone] Cell#: [phone]

## 2021-02-17 ENCOUNTER — Inpatient Hospital Stay (HOSPITAL_COMMUNITY): Payer: Self-pay | Admitting: Anesthesiology

## 2021-02-17 ENCOUNTER — Inpatient Hospital Stay (HOSPITAL_COMMUNITY): Payer: Self-pay

## 2021-02-17 ENCOUNTER — Inpatient Hospital Stay (HOSPITAL_COMMUNITY)
Admission: EM | Disposition: A | Discharge status: Home Health | Payer: Self-pay | Source: Home / Self Care | Attending: Cardiology

## 2021-02-17 DIAGNOSIS — Z951 Presence of aortocoronary bypass graft: Secondary | ICD-10-CM

## 2021-02-17 DIAGNOSIS — I340 Nonrheumatic mitral (valve) insufficiency: Secondary | ICD-10-CM

## 2021-02-17 DIAGNOSIS — I2510 Atherosclerotic heart disease of native coronary artery without angina pectoris: Secondary | ICD-10-CM

## 2021-02-17 DIAGNOSIS — Z952 Presence of prosthetic heart valve: Secondary | ICD-10-CM

## 2021-02-17 HISTORY — PX: CORONARY ARTERY BYPASS GRAFT: SHX141

## 2021-02-17 HISTORY — PX: MITRAL VALVE REPLACEMENT: SHX147

## 2021-02-17 HISTORY — PX: TEE WITHOUT CARDIOVERSION: SHX5443

## 2021-02-17 LAB — GLUCOSE, CAPILLARY
Glucose-Capillary: 141 mg/dL — ABNORMAL HIGH (ref 70–99)
Glucose-Capillary: 141 mg/dL — ABNORMAL HIGH (ref 70–99)
Glucose-Capillary: 137 mg/dL — ABNORMAL HIGH (ref 70–99)
Glucose-Capillary: 142 mg/dL — ABNORMAL HIGH (ref 70–99)
Glucose-Capillary: 137 mg/dL — ABNORMAL HIGH (ref 70–99)
Glucose-Capillary: 117 mg/dL — ABNORMAL HIGH (ref 70–99)
Glucose-Capillary: 126 mg/dL — ABNORMAL HIGH (ref 70–99)
Glucose-Capillary: 127 mg/dL — ABNORMAL HIGH (ref 70–99)

## 2021-02-17 LAB — POCT I-STAT 7, (LYTES, BLD GAS, ICA,H+H)
Acid-Base Excess: 4 mmol/L — ABNORMAL HIGH (ref 0.0–2.0)
Acid-Base Excess: 1 mmol/L (ref 0.0–2.0)
Acid-Base Excess: 2 mmol/L (ref 0.0–2.0)
Acid-Base Excess: 4 mmol/L — ABNORMAL HIGH (ref 0.0–2.0)
Acid-Base Excess: 3 mmol/L — ABNORMAL HIGH (ref 0.0–2.0)
Acid-Base Excess: 5 mmol/L — ABNORMAL HIGH (ref 0.0–2.0)
Acid-Base Excess: 1 mmol/L (ref 0.0–2.0)
Acid-Base Excess: 0 mmol/L (ref 0.0–2.0)
Acid-Base Excess: 0 mmol/L (ref 0.0–2.0)
Acid-Base Excess: 0 mmol/L (ref 0.0–2.0)
Acid-base deficit: 1 mmol/L (ref 0.0–2.0)
Acid-base deficit: 3 mmol/L — ABNORMAL HIGH (ref 0.0–2.0)
Bicarbonate: 30.3 mmol/L — ABNORMAL HIGH (ref 20.0–28.0)
Bicarbonate: 27.4 mmol/L (ref 20.0–28.0)
Bicarbonate: 27.3 mmol/L (ref 20.0–28.0)
Bicarbonate: 28.5 mmol/L — ABNORMAL HIGH (ref 20.0–28.0)
Bicarbonate: 27.3 mmol/L (ref 20.0–28.0)
Bicarbonate: 28.5 mmol/L — ABNORMAL HIGH (ref 20.0–28.0)
Bicarbonate: 26.6 mmol/L (ref 20.0–28.0)
Bicarbonate: 28.4 mmol/L — ABNORMAL HIGH (ref 20.0–28.0)
Bicarbonate: 26.8 mmol/L (ref 20.0–28.0)
Bicarbonate: 27.4 mmol/L (ref 20.0–28.0)
Bicarbonate: 27.2 mmol/L (ref 20.0–28.0)
Bicarbonate: 27.1 mmol/L (ref 20.0–28.0)
Calcium, Ion: 1.27 mmol/L (ref 1.15–1.40)
Calcium, Ion: 1.24 mmol/L (ref 1.15–1.40)
Calcium, Ion: 1.06 mmol/L — ABNORMAL LOW (ref 1.15–1.40)
Calcium, Ion: 1.08 mmol/L — ABNORMAL LOW (ref 1.15–1.40)
Calcium, Ion: 1.08 mmol/L — ABNORMAL LOW (ref 1.15–1.40)
Calcium, Ion: 1.07 mmol/L — ABNORMAL LOW (ref 1.15–1.40)
Calcium, Ion: 1.45 mmol/L — ABNORMAL HIGH (ref 1.15–1.40)
Calcium, Ion: 1.28 mmol/L (ref 1.15–1.40)
Calcium, Ion: 1.31 mmol/L (ref 1.15–1.40)
Calcium, Ion: 1.23 mmol/L (ref 1.15–1.40)
Calcium, Ion: 1.22 mmol/L (ref 1.15–1.40)
Calcium, Ion: 1.21 mmol/L (ref 1.15–1.40)
HCT: 39 % (ref 39.0–52.0)
HCT: 36 % — ABNORMAL LOW (ref 39.0–52.0)
HCT: 27 % — ABNORMAL LOW (ref 39.0–52.0)
HCT: 28 % — ABNORMAL LOW (ref 39.0–52.0)
HCT: 28 % — ABNORMAL LOW (ref 39.0–52.0)
HCT: 26 % — ABNORMAL LOW (ref 39.0–52.0)
HCT: 26 % — ABNORMAL LOW (ref 39.0–52.0)
HCT: 30 % — ABNORMAL LOW (ref 39.0–52.0)
HCT: 34 % — ABNORMAL LOW (ref 39.0–52.0)
HCT: 30 % — ABNORMAL LOW (ref 39.0–52.0)
HCT: 31 % — ABNORMAL LOW (ref 39.0–52.0)
HCT: 30 % — ABNORMAL LOW (ref 39.0–52.0)
Hemoglobin: 13.3 g/dL (ref 13.0–17.0)
Hemoglobin: 12.2 g/dL — ABNORMAL LOW (ref 13.0–17.0)
Hemoglobin: 9.2 g/dL — ABNORMAL LOW (ref 13.0–17.0)
Hemoglobin: 9.5 g/dL — ABNORMAL LOW (ref 13.0–17.0)
Hemoglobin: 9.5 g/dL — ABNORMAL LOW (ref 13.0–17.0)
Hemoglobin: 8.8 g/dL — ABNORMAL LOW (ref 13.0–17.0)
Hemoglobin: 8.8 g/dL — ABNORMAL LOW (ref 13.0–17.0)
Hemoglobin: 10.2 g/dL — ABNORMAL LOW (ref 13.0–17.0)
Hemoglobin: 11.6 g/dL — ABNORMAL LOW (ref 13.0–17.0)
Hemoglobin: 10.2 g/dL — ABNORMAL LOW (ref 13.0–17.0)
Hemoglobin: 10.5 g/dL — ABNORMAL LOW (ref 13.0–17.0)
Hemoglobin: 10.2 g/dL — ABNORMAL LOW (ref 13.0–17.0)
O2 Saturation: 100 %
O2 Saturation: 100 %
O2 Saturation: 100 %
O2 Saturation: 74 %
O2 Saturation: 100 %
O2 Saturation: 100 %
O2 Saturation: 100 %
O2 Saturation: 100 %
O2 Saturation: 95 %
O2 Saturation: 99 %
O2 Saturation: 99 %
O2 Saturation: 100 %
Patient temperature: 37.3
Patient temperature: 37.2
Patient temperature: 37.6
Patient temperature: 37.9
Potassium: 5 mmol/L (ref 3.5–5.1)
Potassium: 5.3 mmol/L — ABNORMAL HIGH (ref 3.5–5.1)
Potassium: 5 mmol/L (ref 3.5–5.1)
Potassium: 5.5 mmol/L — ABNORMAL HIGH (ref 3.5–5.1)
Potassium: 5.8 mmol/L — ABNORMAL HIGH (ref 3.5–5.1)
Potassium: 5.6 mmol/L — ABNORMAL HIGH (ref 3.5–5.1)
Potassium: 6.5 mmol/L (ref 3.5–5.1)
Potassium: 5.6 mmol/L — ABNORMAL HIGH (ref 3.5–5.1)
Potassium: 5.5 mmol/L — ABNORMAL HIGH (ref 3.5–5.1)
Potassium: 5.5 mmol/L — ABNORMAL HIGH (ref 3.5–5.1)
Potassium: 5.9 mmol/L — ABNORMAL HIGH (ref 3.5–5.1)
Potassium: 6.5 mmol/L (ref 3.5–5.1)
Sodium: 134 mmol/L — ABNORMAL LOW (ref 135–145)
Sodium: 132 mmol/L — ABNORMAL LOW (ref 135–145)
Sodium: 132 mmol/L — ABNORMAL LOW (ref 135–145)
Sodium: 135 mmol/L (ref 135–145)
Sodium: 132 mmol/L — ABNORMAL LOW (ref 135–145)
Sodium: 129 mmol/L — ABNORMAL LOW (ref 135–145)
Sodium: 131 mmol/L — ABNORMAL LOW (ref 135–145)
Sodium: 133 mmol/L — ABNORMAL LOW (ref 135–145)
Sodium: 134 mmol/L — ABNORMAL LOW (ref 135–145)
Sodium: 135 mmol/L (ref 135–145)
Sodium: 135 mmol/L (ref 135–145)
Sodium: 134 mmol/L — ABNORMAL LOW (ref 135–145)
TCO2: 32 mmol/L (ref 22–32)
TCO2: 29 mmol/L (ref 22–32)
TCO2: 29 mmol/L (ref 22–32)
TCO2: 30 mmol/L (ref 22–32)
TCO2: 28 mmol/L (ref 22–32)
TCO2: 30 mmol/L (ref 22–32)
TCO2: 28 mmol/L (ref 22–32)
TCO2: 31 mmol/L (ref 22–32)
TCO2: 29 mmol/L (ref 22–32)
TCO2: 29 mmol/L (ref 22–32)
TCO2: 29 mmol/L (ref 22–32)
TCO2: 29 mmol/L (ref 22–32)
pCO2 arterial: 53.2 mmHg — ABNORMAL HIGH (ref 32.0–48.0)
pCO2 arterial: 50.4 mmHg — ABNORMAL HIGH (ref 32.0–48.0)
pCO2 arterial: 42.4 mmHg (ref 32.0–48.0)
pCO2 arterial: 46.1 mmHg (ref 32.0–48.0)
pCO2 arterial: 39.5 mmHg (ref 32.0–48.0)
pCO2 arterial: 36.3 mmHg (ref 32.0–48.0)
pCO2 arterial: 46.1 mmHg (ref 32.0–48.0)
pCO2 arterial: 72.5 mmHg (ref 32.0–48.0)
pCO2 arterial: 74.5 mmHg (ref 32.0–48.0)
pCO2 arterial: 60.7 mmHg — ABNORMAL HIGH (ref 32.0–48.0)
pCO2 arterial: 61 mmHg — ABNORMAL HIGH (ref 32.0–48.0)
pCO2 arterial: 55.2 mmHg — ABNORMAL HIGH (ref 32.0–48.0)
pH, Arterial: 7.364 (ref 7.350–7.450)
pH, Arterial: 7.344 — ABNORMAL LOW (ref 7.350–7.450)
pH, Arterial: 7.38 (ref 7.350–7.450)
pH, Arterial: 7.435 (ref 7.350–7.450)
pH, Arterial: 7.447 (ref 7.350–7.450)
pH, Arterial: 7.503 — ABNORMAL HIGH (ref 7.350–7.450)
pH, Arterial: 7.369 (ref 7.350–7.450)
pH, Arterial: 7.201 — ABNORMAL LOW (ref 7.350–7.450)
pH, Arterial: 7.166 — CL (ref 7.350–7.450)
pH, Arterial: 7.263 — ABNORMAL LOW (ref 7.350–7.450)
pH, Arterial: 7.261 — ABNORMAL LOW (ref 7.350–7.450)
pH, Arterial: 7.304 — ABNORMAL LOW (ref 7.350–7.450)
pO2, Arterial: 343 mmHg — ABNORMAL HIGH (ref 83.0–108.0)
pO2, Arterial: 319 mmHg — ABNORMAL HIGH (ref 83.0–108.0)
pO2, Arterial: 310 mmHg — ABNORMAL HIGH (ref 83.0–108.0)
pO2, Arterial: 41 mmHg — ABNORMAL LOW (ref 83.0–108.0)
pO2, Arterial: 367 mmHg — ABNORMAL HIGH (ref 83.0–108.0)
pO2, Arterial: 356 mmHg — ABNORMAL HIGH (ref 83.0–108.0)
pO2, Arterial: 276 mmHg — ABNORMAL HIGH (ref 83.0–108.0)
pO2, Arterial: 349 mmHg — ABNORMAL HIGH (ref 83.0–108.0)
pO2, Arterial: 98 mmHg (ref 83.0–108.0)
pO2, Arterial: 141 mmHg — ABNORMAL HIGH (ref 83.0–108.0)
pO2, Arterial: 178 mmHg — ABNORMAL HIGH (ref 83.0–108.0)
pO2, Arterial: 201 mmHg — ABNORMAL HIGH (ref 83.0–108.0)

## 2021-02-17 LAB — CBC
HCT: 37.9 % — ABNORMAL LOW (ref 39.0–52.0)
HCT: 33.5 % — ABNORMAL LOW (ref 39.0–52.0)
HCT: 32.5 % — ABNORMAL LOW (ref 39.0–52.0)
Hemoglobin: 11.3 g/dL — ABNORMAL LOW (ref 13.0–17.0)
Hemoglobin: 9.5 g/dL — ABNORMAL LOW (ref 13.0–17.0)
Hemoglobin: 9.4 g/dL — ABNORMAL LOW (ref 13.0–17.0)
MCH: 23.3 pg — ABNORMAL LOW (ref 26.0–34.0)
MCH: 22.9 pg — ABNORMAL LOW (ref 26.0–34.0)
MCH: 23.3 pg — ABNORMAL LOW (ref 26.0–34.0)
MCHC: 29.8 g/dL — ABNORMAL LOW (ref 30.0–36.0)
MCHC: 28.4 g/dL — ABNORMAL LOW (ref 30.0–36.0)
MCHC: 28.9 g/dL — ABNORMAL LOW (ref 30.0–36.0)
MCV: 78 fL — ABNORMAL LOW (ref 80.0–100.0)
MCV: 80.7 fL (ref 80.0–100.0)
MCV: 80.4 fL (ref 80.0–100.0)
Platelets: 208 10*3/uL (ref 150–400)
Platelets: 198 10*3/uL (ref 150–400)
Platelets: 187 10*3/uL (ref 150–400)
RBC: 4.86 MIL/uL (ref 4.22–5.81)
RBC: 4.15 MIL/uL — ABNORMAL LOW (ref 4.22–5.81)
RBC: 4.04 MIL/uL — ABNORMAL LOW (ref 4.22–5.81)
RDW: 31.4 % — ABNORMAL HIGH (ref 11.5–15.5)
RDW: 31.1 % — ABNORMAL HIGH (ref 11.5–15.5)
RDW: 30.5 % — ABNORMAL HIGH (ref 11.5–15.5)
WBC: 17 10*3/uL — ABNORMAL HIGH (ref 4.0–10.5)
WBC: 33.1 10*3/uL — ABNORMAL HIGH (ref 4.0–10.5)
WBC: 28.4 10*3/uL — ABNORMAL HIGH (ref 4.0–10.5)
nRBC: 0 % (ref 0.0–0.2)
nRBC: 0.1 % (ref 0.0–0.2)
nRBC: 0.1 % (ref 0.0–0.2)

## 2021-02-17 LAB — MAGNESIUM
Magnesium: 2.3 mg/dL (ref 1.7–2.4)
Magnesium: 3.7 mg/dL — ABNORMAL HIGH (ref 1.7–2.4)

## 2021-02-17 LAB — POCT I-STAT, CHEM 8
BUN: 33 mg/dL — ABNORMAL HIGH (ref 6–20)
BUN: 36 mg/dL — ABNORMAL HIGH (ref 6–20)
BUN: 29 mg/dL — ABNORMAL HIGH (ref 6–20)
BUN: 28 mg/dL — ABNORMAL HIGH (ref 6–20)
BUN: 29 mg/dL — ABNORMAL HIGH (ref 6–20)
BUN: 37 mg/dL — ABNORMAL HIGH (ref 6–20)
Calcium, Ion: 1.24 mmol/L (ref 1.15–1.40)
Calcium, Ion: 1.22 mmol/L (ref 1.15–1.40)
Calcium, Ion: 1.07 mmol/L — ABNORMAL LOW (ref 1.15–1.40)
Calcium, Ion: 1.1 mmol/L — ABNORMAL LOW (ref 1.15–1.40)
Calcium, Ion: 1.08 mmol/L — ABNORMAL LOW (ref 1.15–1.40)
Calcium, Ion: 1.42 mmol/L — ABNORMAL HIGH (ref 1.15–1.40)
Chloride: 99 mmol/L (ref 98–111)
Chloride: 99 mmol/L (ref 98–111)
Chloride: 99 mmol/L (ref 98–111)
Chloride: 98 mmol/L (ref 98–111)
Chloride: 96 mmol/L — ABNORMAL LOW (ref 98–111)
Chloride: 104 mmol/L (ref 98–111)
Creatinine, Ser: 1.1 mg/dL (ref 0.61–1.24)
Creatinine, Ser: 1 mg/dL (ref 0.61–1.24)
Creatinine, Ser: 1.1 mg/dL (ref 0.61–1.24)
Creatinine, Ser: 1.2 mg/dL (ref 0.61–1.24)
Creatinine, Ser: 1.1 mg/dL (ref 0.61–1.24)
Creatinine, Ser: 1.2 mg/dL (ref 0.61–1.24)
Glucose, Bld: 99 mg/dL (ref 70–99)
Glucose, Bld: 103 mg/dL — ABNORMAL HIGH (ref 70–99)
Glucose, Bld: 99 mg/dL (ref 70–99)
Glucose, Bld: 107 mg/dL — ABNORMAL HIGH (ref 70–99)
Glucose, Bld: 105 mg/dL — ABNORMAL HIGH (ref 70–99)
Glucose, Bld: 105 mg/dL — ABNORMAL HIGH (ref 70–99)
HCT: 37 % — ABNORMAL LOW (ref 39.0–52.0)
HCT: 37 % — ABNORMAL LOW (ref 39.0–52.0)
HCT: 26 % — ABNORMAL LOW (ref 39.0–52.0)
HCT: 26 % — ABNORMAL LOW (ref 39.0–52.0)
HCT: 25 % — ABNORMAL LOW (ref 39.0–52.0)
HCT: 26 % — ABNORMAL LOW (ref 39.0–52.0)
Hemoglobin: 12.6 g/dL — ABNORMAL LOW (ref 13.0–17.0)
Hemoglobin: 12.6 g/dL — ABNORMAL LOW (ref 13.0–17.0)
Hemoglobin: 8.8 g/dL — ABNORMAL LOW (ref 13.0–17.0)
Hemoglobin: 8.8 g/dL — ABNORMAL LOW (ref 13.0–17.0)
Hemoglobin: 8.5 g/dL — ABNORMAL LOW (ref 13.0–17.0)
Hemoglobin: 8.8 g/dL — ABNORMAL LOW (ref 13.0–17.0)
Potassium: 5 mmol/L (ref 3.5–5.1)
Potassium: 5.7 mmol/L — ABNORMAL HIGH (ref 3.5–5.1)
Potassium: 6.8 mmol/L (ref 3.5–5.1)
Potassium: 5.7 mmol/L — ABNORMAL HIGH (ref 3.5–5.1)
Potassium: 5.7 mmol/L — ABNORMAL HIGH (ref 3.5–5.1)
Potassium: 6.4 mmol/L (ref 3.5–5.1)
Sodium: 134 mmol/L — ABNORMAL LOW (ref 135–145)
Sodium: 131 mmol/L — ABNORMAL LOW (ref 135–145)
Sodium: 132 mmol/L — ABNORMAL LOW (ref 135–145)
Sodium: 131 mmol/L — ABNORMAL LOW (ref 135–145)
Sodium: 130 mmol/L — ABNORMAL LOW (ref 135–145)
Sodium: 131 mmol/L — ABNORMAL LOW (ref 135–145)
TCO2: 31 mmol/L (ref 22–32)
TCO2: 29 mmol/L (ref 22–32)
TCO2: 28 mmol/L (ref 22–32)
TCO2: 28 mmol/L (ref 22–32)
TCO2: 27 mmol/L (ref 22–32)
TCO2: 26 mmol/L (ref 22–32)

## 2021-02-17 LAB — BASIC METABOLIC PANEL
Anion gap: 12 (ref 5–15)
Anion gap: 8 (ref 5–15)
BUN: 27 mg/dL — ABNORMAL HIGH (ref 6–20)
BUN: 26 mg/dL — ABNORMAL HIGH (ref 6–20)
CO2: 27 mmol/L (ref 22–32)
CO2: 24 mmol/L (ref 22–32)
Calcium: 9.7 mg/dL (ref 8.9–10.3)
Calcium: 8.5 mg/dL — ABNORMAL LOW (ref 8.9–10.3)
Chloride: 94 mmol/L — ABNORMAL LOW (ref 98–111)
Chloride: 101 mmol/L (ref 98–111)
Creatinine, Ser: 1.13 mg/dL (ref 0.61–1.24)
Creatinine, Ser: 1.23 mg/dL (ref 0.61–1.24)
GFR, Estimated: >60 mL/min (ref >60)
GFR, Estimated: >60 mL/min (ref >60)
Glucose, Bld: 114 mg/dL — ABNORMAL HIGH (ref 70–99)
Glucose, Bld: 120 mg/dL — ABNORMAL HIGH (ref 70–99)
Potassium: 4.7 mmol/L (ref 3.5–5.1)
Potassium: 6.7 mmol/L (ref 3.5–5.1)
Sodium: 133 mmol/L — ABNORMAL LOW (ref 135–145)
Sodium: 133 mmol/L — ABNORMAL LOW (ref 135–145)

## 2021-02-17 LAB — HEMOGLOBIN A1C
Hgb A1c MFr Bld: 4.7 % — ABNORMAL LOW (ref 4.8–5.6)
Mean Plasma Glucose: 88.19 mg/dL

## 2021-02-17 LAB — PROTIME-INR
INR: 1.2 (ref 0.8–1.2)
INR: 1.3 — ABNORMAL HIGH (ref 0.8–1.2)
Prothrombin Time: 14.7 seconds (ref 11.4–15.2)
Prothrombin Time: 16.4 seconds — ABNORMAL HIGH (ref 11.4–15.2)

## 2021-02-17 LAB — BLOOD GAS, ARTERIAL
Acid-Base Excess: 2.6 mmol/L — ABNORMAL HIGH (ref 0.0–2.0)
Bicarbonate: 26.4 mmol/L (ref 20.0–28.0)
Drawn by: 270271
FIO2: 21
O2 Saturation: 95.3 %
Patient temperature: 37
pCO2 arterial: 39.2 mmHg (ref 32.0–48.0)
pH, Arterial: 7.443 (ref 7.350–7.450)
pO2, Arterial: 78.6 mmHg — ABNORMAL LOW (ref 83.0–108.0)

## 2021-02-17 LAB — FIBRINOGEN: Fibrinogen: 432 mg/dL (ref 210–475)

## 2021-02-17 LAB — PLATELET COUNT: Platelets: 164 10*3/uL (ref 150–400)

## 2021-02-17 LAB — COOXEMETRY PANEL
Carboxyhemoglobin: 2.1 % — ABNORMAL HIGH (ref 0.5–1.5)
Methemoglobin: 0.8 % (ref 0.0–1.5)
O2 Saturation: 61.7 %
Total hemoglobin: 11.4 g/dL — ABNORMAL LOW (ref 12.0–16.0)

## 2021-02-17 LAB — POTASSIUM: Potassium: 5.9 mmol/L — ABNORMAL HIGH (ref 3.5–5.1)

## 2021-02-17 LAB — HEMOGLOBIN AND HEMATOCRIT, BLOOD
HCT: 26.1 % — ABNORMAL LOW (ref 39.0–52.0)
Hemoglobin: 7.7 g/dL — ABNORMAL LOW (ref 13.0–17.0)

## 2021-02-17 LAB — APTT
aPTT: 121 seconds — ABNORMAL HIGH (ref 24–36)
aPTT: 37 seconds — ABNORMAL HIGH (ref 24–36)

## 2021-02-17 LAB — PREPARE RBC (CROSSMATCH)

## 2021-02-17 SURGERY — REDO STERNOTOMY
Anesthesia: General | Site: Chest

## 2021-02-17 MED ORDER — DEXTROSE 50 % IV SOLN
1.0000 | Freq: Once | INTRAVENOUS | Status: AC
  Filled 2021-02-17: qty 50

## 2021-02-17 MED ORDER — PHENYLEPHRINE 40 MCG/ML (10ML) SYRINGE FOR IV PUSH (FOR BLOOD PRESSURE SUPPORT)
PREFILLED_SYRINGE | INTRAVENOUS | Status: DC | PRN
  Administered 2021-02-17: 40 ug via INTRAVENOUS
  Administered 2021-02-17: 80 ug via INTRAVENOUS
  Administered 2021-02-17: 40 ug via INTRAVENOUS
  Administered 2021-02-17 (×2): 80 ug via INTRAVENOUS
  Administered 2021-02-17: 40 ug via INTRAVENOUS
  Administered 2021-02-17: 120 ug via INTRAVENOUS
  Administered 2021-02-17: 40 ug via INTRAVENOUS

## 2021-02-17 MED ORDER — 0.9 % SODIUM CHLORIDE (POUR BTL) OPTIME
TOPICAL | Status: DC | PRN
  Administered 2021-02-17: 5000 mL
  Administered 2021-02-17: 1000 mL

## 2021-02-17 MED ORDER — MILRINONE LACTATE IN DEXTROSE 20-5 MG/100ML-% IV SOLN
0.1250 ug/kg/min | INTRAVENOUS | Status: DC
  Administered 2021-02-17 – 2021-02-20 (×6): 0.375 ug/kg/min via INTRAVENOUS
  Administered 2021-02-21: 0.25 ug/kg/min via INTRAVENOUS
  Administered 2021-02-21: 0.125 ug/kg/min via INTRAVENOUS
  Filled 2021-02-17 (×8): qty 100

## 2021-02-17 MED ORDER — PANTOPRAZOLE SODIUM 40 MG PO TBEC
40.0000 mg | DELAYED_RELEASE_TABLET | Freq: Every day | ORAL | Status: DC
  Administered 2021-02-19 – 2021-02-22 (×4): 40 mg via ORAL
  Filled 2021-02-17 (×4): qty 1

## 2021-02-17 MED ORDER — FAMOTIDINE IN NACL 20-0.9 MG/50ML-% IV SOLN
20.0000 mg | Freq: Two times a day (BID) | INTRAVENOUS | Status: AC
  Administered 2021-02-17: 20 mg via INTRAVENOUS
  Filled 2021-02-17 (×2): qty 50

## 2021-02-17 MED ORDER — CHLORHEXIDINE GLUCONATE 0.12% ORAL RINSE (MEDLINE KIT)
15.0000 mL | Freq: Two times a day (BID) | OROMUCOSAL | Status: DC
  Administered 2021-02-17 – 2021-02-21 (×9): 15 mL via OROMUCOSAL

## 2021-02-17 MED ORDER — ASPIRIN EC 325 MG PO TBEC
325.0000 mg | DELAYED_RELEASE_TABLET | Freq: Every day | ORAL | Status: DC
  Administered 2021-02-19 – 2021-02-20 (×2): 325 mg via ORAL
  Filled 2021-02-17 (×2): qty 1

## 2021-02-17 MED ORDER — FENTANYL CITRATE (PF) 250 MCG/5ML IJ SOLN
INTRAMUSCULAR | Status: AC
  Filled 2021-02-17: qty 5

## 2021-02-17 MED ORDER — ACETAMINOPHEN 160 MG/5ML PO SOLN
1000.0000 mg | Freq: Four times a day (QID) | ORAL | Status: AC
  Administered 2021-02-17 – 2021-02-18 (×2): 1000 mg
  Filled 2021-02-17 (×2): qty 40.6

## 2021-02-17 MED ORDER — PLASMA-LYTE A IV SOLN
INTRAVENOUS | Status: DC | PRN
  Administered 2021-02-17: 1000 mL via INTRAVASCULAR

## 2021-02-17 MED ORDER — SODIUM CHLORIDE 0.45 % IV SOLN: INTRAVENOUS | Status: DC | PRN

## 2021-02-17 MED ORDER — ALBUMIN HUMAN 5 % IV SOLN
250.0000 mL | INTRAVENOUS | Status: AC | PRN
  Administered 2021-02-17: 12.5 g via INTRAVENOUS

## 2021-02-17 MED ORDER — ROCURONIUM BROMIDE 10 MG/ML (PF) SYRINGE
PREFILLED_SYRINGE | INTRAVENOUS | Status: AC
  Filled 2021-02-17: qty 10

## 2021-02-17 MED ORDER — SODIUM CHLORIDE 0.9 % IV SOLN: INTRAVENOUS | Status: DC | PRN

## 2021-02-17 MED ORDER — SODIUM BICARBONATE 8.4 % IV SOLN
50.0000 meq | Freq: Once | INTRAVENOUS | Status: AC
  Administered 2021-02-17: 50 meq via INTRAVENOUS
  Filled 2021-02-17: qty 50

## 2021-02-17 MED ORDER — METOPROLOL TARTRATE 25 MG/10 ML ORAL SUSPENSION: 12.5000 mg | Freq: Two times a day (BID) | ORAL | Status: DC

## 2021-02-17 MED ORDER — LACTATED RINGERS IV SOLN: INTRAVENOUS | Status: DC

## 2021-02-17 MED ORDER — CHLORHEXIDINE GLUCONATE CLOTH 2 % EX PADS
6.0000 | MEDICATED_PAD | Freq: Every day | CUTANEOUS | Status: DC
  Administered 2021-02-17 – 2021-02-22 (×6): 6 via TOPICAL

## 2021-02-17 MED ORDER — MIDAZOLAM HCL 2 MG/2ML IJ SOLN: 2.0000 mg | INTRAMUSCULAR | Status: DC | PRN

## 2021-02-17 MED ORDER — ROCURONIUM BROMIDE 100 MG/10ML IV SOLN
INTRAVENOUS | Status: DC | PRN
  Administered 2021-02-17: 50 mg via INTRAVENOUS
  Administered 2021-02-17: 100 mg via INTRAVENOUS
  Administered 2021-02-17: 50 mg via INTRAVENOUS
  Administered 2021-02-17: 20 mg via INTRAVENOUS
  Administered 2021-02-17: 50 mg via INTRAVENOUS

## 2021-02-17 MED ORDER — LACTATED RINGERS IV SOLN: 500.0000 mL | Freq: Once | INTRAVENOUS | Status: DC | PRN

## 2021-02-17 MED ORDER — INSULIN ASPART 100 UNIT/ML IV SOLN
10.0000 [IU] | Freq: Once | INTRAVENOUS | Status: AC
  Administered 2021-02-17: 10 [IU] via INTRAVENOUS

## 2021-02-17 MED ORDER — PHENYLEPHRINE 40 MCG/ML (10ML) SYRINGE FOR IV PUSH (FOR BLOOD PRESSURE SUPPORT)
PREFILLED_SYRINGE | INTRAVENOUS | Status: AC
  Filled 2021-02-17: qty 10

## 2021-02-17 MED ORDER — HEPARIN SODIUM (PORCINE) 1000 UNIT/ML IJ SOLN
INTRAMUSCULAR | Status: AC
  Filled 2021-02-17: qty 1

## 2021-02-17 MED ORDER — ACETAMINOPHEN 500 MG PO TABS
1000.0000 mg | ORAL_TABLET | Freq: Four times a day (QID) | ORAL | Status: AC
  Administered 2021-02-18 – 2021-02-22 (×17): 1000 mg via ORAL
  Filled 2021-02-17 (×17): qty 2

## 2021-02-17 MED ORDER — SODIUM CHLORIDE 0.9% FLUSH: 3.0000 mL | INTRAVENOUS | Status: DC | PRN

## 2021-02-17 MED ORDER — SODIUM CHLORIDE 0.9 % IV SOLN: 250.0000 mL | INTRAVENOUS | Status: DC

## 2021-02-17 MED ORDER — THROMBIN 20000 UNITS EX SOLR
CUTANEOUS | Status: DC | PRN
  Administered 2021-02-17: 20000 [IU] via TOPICAL

## 2021-02-17 MED ORDER — VASOPRESSIN 20 UNIT/ML IV SOLN
INTRAVENOUS | Status: AC
  Filled 2021-02-17: qty 1

## 2021-02-17 MED ORDER — PROPOFOL 10 MG/ML IV BOLUS
INTRAVENOUS | Status: AC
  Filled 2021-02-17: qty 20

## 2021-02-17 MED ORDER — BISACODYL 5 MG PO TBEC
10.0000 mg | DELAYED_RELEASE_TABLET | Freq: Every day | ORAL | Status: DC
  Administered 2021-02-18 – 2021-02-20 (×3): 10 mg via ORAL
  Filled 2021-02-17 (×4): qty 2

## 2021-02-17 MED ORDER — MIDAZOLAM HCL 5 MG/5ML IJ SOLN
INTRAMUSCULAR | Status: DC | PRN
  Administered 2021-02-17 (×5): 2 mg via INTRAVENOUS

## 2021-02-17 MED ORDER — INSULIN REGULAR(HUMAN) IN NACL 100-0.9 UT/100ML-% IV SOLN
INTRAVENOUS | Status: DC
  Administered 2021-02-17: 0.6 [IU]/h via INTRAVENOUS

## 2021-02-17 MED ORDER — LACTATED RINGERS IV SOLN
INTRAVENOUS | Status: DC
  Administered 2021-02-18: 10 mL/h via INTRAVENOUS
  Administered 2021-02-21: 20 mL/h via INTRAVENOUS

## 2021-02-17 MED ORDER — ACETAMINOPHEN 650 MG RE SUPP
650.0000 mg | Freq: Once | RECTAL | Status: AC
  Administered 2021-02-17: 650 mg via RECTAL

## 2021-02-17 MED ORDER — METOPROLOL TARTRATE 5 MG/5ML IV SOLN: 2.5000 mg | INTRAVENOUS | Status: DC | PRN

## 2021-02-17 MED ORDER — METOCLOPRAMIDE HCL 5 MG/ML IJ SOLN
10.0000 mg | Freq: Four times a day (QID) | INTRAMUSCULAR | Status: AC
  Administered 2021-02-17 – 2021-02-18 (×3): 10 mg via INTRAVENOUS
  Filled 2021-02-17 (×3): qty 2

## 2021-02-17 MED ORDER — PHENYLEPHRINE HCL-NACL 20-0.9 MG/250ML-% IV SOLN
0.0000 ug/min | INTRAVENOUS | Status: DC
  Administered 2021-02-17 – 2021-02-18 (×2): 60 ug/min via INTRAVENOUS
  Filled 2021-02-17 (×2): qty 250

## 2021-02-17 MED ORDER — NITROGLYCERIN IN D5W 200-5 MCG/ML-% IV SOLN: 0.0000 ug/min | INTRAVENOUS | Status: DC

## 2021-02-17 MED ORDER — LACTATED RINGERS IV SOLN: INTRAVENOUS | Status: DC | PRN

## 2021-02-17 MED ORDER — MORPHINE SULFATE (PF) 2 MG/ML IV SOLN
1.0000 mg | INTRAVENOUS | Status: DC | PRN
  Administered 2021-02-18: 3 mg via INTRAVENOUS
  Administered 2021-02-18 – 2021-02-19 (×2): 2 mg via INTRAVENOUS
  Filled 2021-02-17: qty 2
  Filled 2021-02-17 (×2): qty 1

## 2021-02-17 MED ORDER — ALBUTEROL SULFATE (2.5 MG/3ML) 0.083% IN NEBU
2.5000 mg | INHALATION_SOLUTION | Freq: Four times a day (QID) | RESPIRATORY_TRACT | Status: DC
  Administered 2021-02-17 – 2021-02-19 (×9): 2.5 mg via RESPIRATORY_TRACT
  Filled 2021-02-17 (×10): qty 3

## 2021-02-17 MED ORDER — PROTAMINE SULFATE 10 MG/ML IV SOLN
INTRAVENOUS | Status: DC | PRN
  Administered 2021-02-17: 250 mg via INTRAVENOUS

## 2021-02-17 MED ORDER — METOPROLOL TARTRATE 12.5 MG HALF TABLET: 12.5000 mg | ORAL_TABLET | Freq: Two times a day (BID) | ORAL | Status: DC

## 2021-02-17 MED ORDER — POTASSIUM CHLORIDE 10 MEQ/50ML IV SOLN: 10.0000 meq | INTRAVENOUS | Status: AC

## 2021-02-17 MED ORDER — ACETAMINOPHEN 160 MG/5ML PO SOLN: 650.0000 mg | Freq: Once | ORAL | Status: AC

## 2021-02-17 MED ORDER — ORAL CARE MOUTH RINSE
15.0000 mL | OROMUCOSAL | Status: DC
  Administered 2021-02-17 – 2021-02-19 (×14): 15 mL via OROMUCOSAL

## 2021-02-17 MED ORDER — PROTAMINE SULFATE 10 MG/ML IV SOLN
INTRAVENOUS | Status: AC
  Filled 2021-02-17: qty 25

## 2021-02-17 MED ORDER — SODIUM BICARBONATE 8.4 % IV SOLN
50.0000 meq | Freq: Once | INTRAVENOUS | Status: AC
  Administered 2021-02-17: 50 meq via INTRAVENOUS

## 2021-02-17 MED ORDER — HEMOSTATIC AGENTS (NO CHARGE) OPTIME
TOPICAL | Status: DC | PRN
  Administered 2021-02-17: 1 via TOPICAL

## 2021-02-17 MED ORDER — CHLORHEXIDINE GLUCONATE 0.12 % MT SOLN
15.0000 mL | OROMUCOSAL | Status: AC
  Administered 2021-02-17: 15 mL via OROMUCOSAL

## 2021-02-17 MED ORDER — EPINEPHRINE HCL 5 MG/250ML IV SOLN IN NS
0.5000 ug/min | INTRAVENOUS | Status: DC
  Administered 2021-02-17: 5 ug/min via INTRAVENOUS
  Administered 2021-02-18: 3 ug/min via INTRAVENOUS
  Filled 2021-02-17: qty 250

## 2021-02-17 MED ORDER — MIDAZOLAM HCL (PF) 10 MG/2ML IJ SOLN
INTRAMUSCULAR | Status: AC
  Filled 2021-02-17: qty 2

## 2021-02-17 MED ORDER — MAGNESIUM SULFATE 4 GM/100ML IV SOLN
4.0000 g | Freq: Once | INTRAVENOUS | Status: AC
  Filled 2021-02-17: qty 100

## 2021-02-17 MED ORDER — DOCUSATE SODIUM 100 MG PO CAPS: 200.0000 mg | ORAL_CAPSULE | Freq: Every day | ORAL | Status: DC

## 2021-02-17 MED ORDER — TRAMADOL HCL 50 MG PO TABS: 50.0000 mg | ORAL_TABLET | ORAL | Status: DC | PRN

## 2021-02-17 MED ORDER — PROPOFOL 10 MG/ML IV BOLUS
INTRAVENOUS | Status: DC | PRN
  Administered 2021-02-17: 10 mg via INTRAVENOUS

## 2021-02-17 MED ORDER — BISACODYL 10 MG RE SUPP: 10.0000 mg | Freq: Every day | RECTAL | Status: DC

## 2021-02-17 MED ORDER — LIDOCAINE 2% (20 MG/ML) 5 ML SYRINGE
INTRAMUSCULAR | Status: AC
  Filled 2021-02-17: qty 5

## 2021-02-17 MED ORDER — SODIUM CHLORIDE 0.9% FLUSH
3.0000 mL | Freq: Two times a day (BID) | INTRAVENOUS | Status: DC
  Administered 2021-02-18 – 2021-02-22 (×8): 3 mL via INTRAVENOUS

## 2021-02-17 MED ORDER — ALBUTEROL SULFATE (2.5 MG/3ML) 0.083% IN NEBU
INHALATION_SOLUTION | RESPIRATORY_TRACT | Status: AC
  Administered 2021-02-17: 2.5 mg
  Filled 2021-02-17: qty 3

## 2021-02-17 MED ORDER — ONDANSETRON HCL 4 MG/2ML IJ SOLN: 4.0000 mg | Freq: Four times a day (QID) | INTRAMUSCULAR | Status: DC | PRN

## 2021-02-17 MED ORDER — ETOMIDATE 2 MG/ML IV SOLN
INTRAVENOUS | Status: AC
  Filled 2021-02-17: qty 10

## 2021-02-17 MED ORDER — DEXTROSE 50 % IV SOLN
0.0000 mL | INTRAVENOUS | Status: DC | PRN
  Administered 2021-02-17: 50 mL via INTRAVENOUS

## 2021-02-17 MED ORDER — ETOMIDATE 2 MG/ML IV SOLN
INTRAVENOUS | Status: DC | PRN
  Administered 2021-02-17: 12 mg via INTRAVENOUS

## 2021-02-17 MED ORDER — SODIUM CHLORIDE 0.9 % IV SOLN: INTRAVENOUS | Status: DC

## 2021-02-17 MED ORDER — OXYCODONE HCL 5 MG PO TABS: 5.0000 mg | ORAL_TABLET | ORAL | Status: DC | PRN

## 2021-02-17 MED ORDER — FENTANYL CITRATE (PF) 250 MCG/5ML IJ SOLN
INTRAMUSCULAR | Status: DC | PRN
  Administered 2021-02-17 (×4): 100 ug via INTRAVENOUS
  Administered 2021-02-17: 150 ug via INTRAVENOUS

## 2021-02-17 MED ORDER — DEXMEDETOMIDINE HCL IN NACL 400 MCG/100ML IV SOLN
0.0000 ug/kg/h | INTRAVENOUS | Status: DC
Start: 2021-02-17 — End: 2021-02-19
  Administered 2021-02-17: 0.7 ug/kg/h via INTRAVENOUS
  Filled 2021-02-17: qty 100

## 2021-02-17 MED ORDER — HEPARIN SODIUM (PORCINE) 1000 UNIT/ML IJ SOLN
INTRAMUSCULAR | Status: DC | PRN
  Administered 2021-02-17: 25000 [IU] via INTRAVENOUS

## 2021-02-17 MED ORDER — THROMBIN (RECOMBINANT) 20000 UNITS EX SOLR
CUTANEOUS | Status: AC
  Filled 2021-02-17: qty 20000

## 2021-02-17 MED ORDER — ASPIRIN 81 MG PO CHEW
324.0000 mg | CHEWABLE_TABLET | Freq: Every day | ORAL | Status: DC
  Administered 2021-02-18: 324 mg
  Filled 2021-02-17: qty 4

## 2021-02-17 MED ORDER — THROMBIN 20000 UNITS EX SOLR
OROMUCOSAL | Status: DC | PRN
  Administered 2021-02-17 (×3): 4 mL via TOPICAL

## 2021-02-17 MED ORDER — NOREPINEPHRINE 4 MG/250ML-% IV SOLN
0.0000 ug/min | INTRAVENOUS | Status: DC
  Administered 2021-02-17: 10 ug/min via INTRAVENOUS
  Administered 2021-02-18: 7 ug/min via INTRAVENOUS
  Filled 2021-02-17 (×2): qty 250

## 2021-02-17 SURGICAL SUPPLY — 140 items
ADAPTER CARDIO PERF ANTE/RETRO (ADAPTER) ×4 IMPLANT
APPLICATOR COTTON TIP 6 STRL (MISCELLANEOUS) IMPLANT
APPLICATOR COTTON TIP 6IN STRL (MISCELLANEOUS) ×4 IMPLANT
ASCENDING AORTIC VALVE 27/29 (Prosthesis & Implant Heart) ×4 IMPLANT
BAG DECANTER FOR FLEXI CONT (MISCELLANEOUS) ×4 IMPLANT
BLADE CLIPPER SURG (BLADE) ×3 IMPLANT
BLADE CORE FAN STRYKER (BLADE) ×4 IMPLANT
BLADE STERNUM SYSTEM 6 (BLADE) ×3 IMPLANT
BLADE SURG 11 STRL SS (BLADE) ×2 IMPLANT
BLADE SURG 15 STRL LF DISP TIS (BLADE) ×3 IMPLANT
BLADE SURG 15 STRL SS (BLADE) ×6
BNDG ELASTIC 4X5.8 VLCR STR LF (GAUZE/BANDAGES/DRESSINGS) ×4 IMPLANT
BNDG ELASTIC 6X5.8 VLCR STR LF (GAUZE/BANDAGES/DRESSINGS) ×4 IMPLANT
BNDG GAUZE ELAST 4 BULKY (GAUZE/BANDAGES/DRESSINGS) ×4 IMPLANT
CANISTER SUCT 3000ML PPV (MISCELLANEOUS) ×4 IMPLANT
CANN PRFSN 3/8XCNCT ST RT ANG (MISCELLANEOUS) ×3
CANNULA GUNDRY RCSP 15FR (MISCELLANEOUS) ×4 IMPLANT
CANNULA PRFSN 3/8XCNCT RT ANG (MISCELLANEOUS) IMPLANT
CANNULA SUMP PERICARDIAL (CANNULA) ×4 IMPLANT
CANNULA VEN MTL TIP RT (MISCELLANEOUS) ×3
CANNULA VRC MALB SNGL STG 36FR (MISCELLANEOUS) IMPLANT
CATH ROBINSON RED A/P 18FR (CATHETERS) ×13 IMPLANT
CATH THORACIC 28FR (CATHETERS) ×5 IMPLANT
CATH THORACIC 36FR (CATHETERS) ×4 IMPLANT
CATH THORACIC 36FR RT ANG (CATHETERS) ×4 IMPLANT
CLIP VESOCCLUDE MED 24/CT (CLIP) IMPLANT
CLIP VESOCCLUDE SM WIDE 24/CT (CLIP) IMPLANT
CNTNR URN SCR LID CUP LEK RST (MISCELLANEOUS) IMPLANT
CONN 1/2X1/2X1/2  BEN (MISCELLANEOUS) ×2
CONN 1/2X1/2X1/2 BEN (MISCELLANEOUS) ×3 IMPLANT
CONN 3/8X1/2 ST GISH (MISCELLANEOUS) ×8 IMPLANT
CONN ST 3/8 X 1/2 (MISCELLANEOUS) ×1 IMPLANT
CONT SPEC 4OZ STRL OR WHT (MISCELLANEOUS) ×4
CONTAINER PROTECT SURGISLUSH (MISCELLANEOUS) ×9 IMPLANT
COVER SURGICAL LIGHT HANDLE (MISCELLANEOUS) ×6 IMPLANT
DERMABOND ADVANCED (GAUZE/BANDAGES/DRESSINGS) ×1
DERMABOND ADVANCED .7 DNX12 (GAUZE/BANDAGES/DRESSINGS) IMPLANT
DEVICE SUT CK QUICK LOAD INDV (Prosthesis & Implant Heart) ×1 IMPLANT
DEVICE SUT CK QUICK LOAD MINI (Prosthesis & Implant Heart) ×1 IMPLANT
DRAPE CARDIOVASCULAR INCISE (DRAPES) ×3
DRAPE SRG 135X102X78XABS (DRAPES) ×3 IMPLANT
DRAPE WARM FLUID 44X44 (DRAPES) ×4 IMPLANT
DRSG COVADERM 4X14 (GAUZE/BANDAGES/DRESSINGS) ×4 IMPLANT
ELECT CAUTERY BLADE 6.4 (BLADE) ×4 IMPLANT
ELECT REM PT RETURN 9FT ADLT (ELECTROSURGICAL) ×8
ELECTRODE REM PT RTRN 9FT ADLT (ELECTROSURGICAL) ×6 IMPLANT
FELT TEFLON 1X6 (MISCELLANEOUS) ×7 IMPLANT
GAUZE 4X4 16PLY ~~LOC~~+RFID DBL (SPONGE) ×1 IMPLANT
GAUZE SPONGE 4X4 12PLY STRL (GAUZE/BANDAGES/DRESSINGS) ×8 IMPLANT
GLOVE SRG 8 PF TXTR STRL LF DI (GLOVE) IMPLANT
GLOVE SURG ENC MOIS LTX SZ6 (GLOVE) IMPLANT
GLOVE SURG ENC MOIS LTX SZ6.5 (GLOVE) IMPLANT
GLOVE SURG ENC MOIS LTX SZ7 (GLOVE) IMPLANT
GLOVE SURG ENC MOIS LTX SZ7.5 (GLOVE) IMPLANT
GLOVE SURG MICRO LTX SZ6 (GLOVE) ×3 IMPLANT
GLOVE SURG MICRO LTX SZ7 (GLOVE) ×8 IMPLANT
GLOVE SURG ORTHO LTX SZ7.5 (GLOVE) IMPLANT
GLOVE SURG UNDER POLY LF SZ6 (GLOVE) IMPLANT
GLOVE SURG UNDER POLY LF SZ6.5 (GLOVE) IMPLANT
GLOVE SURG UNDER POLY LF SZ7 (GLOVE) IMPLANT
GLOVE SURG UNDER POLY LF SZ8 (GLOVE) ×2
GOWN STRL REUS W/ TWL LRG LVL3 (GOWN DISPOSABLE) ×12 IMPLANT
GOWN STRL REUS W/ TWL XL LVL3 (GOWN DISPOSABLE) ×3 IMPLANT
GOWN STRL REUS W/TWL LRG LVL3 (GOWN DISPOSABLE) ×12
GOWN STRL REUS W/TWL XL LVL3 (GOWN DISPOSABLE) ×12
HEMOSTAT POWDER SURGIFOAM 1G (HEMOSTASIS) ×12 IMPLANT
HEMOSTAT SURGICEL 2X14 (HEMOSTASIS) ×4 IMPLANT
INSERT FOGARTY 61MM (MISCELLANEOUS) IMPLANT
INSERT FOGARTY XLG (MISCELLANEOUS) IMPLANT
KIT BASIN OR (CUSTOM PROCEDURE TRAY) ×4 IMPLANT
KIT CATH CPB BARTLE (MISCELLANEOUS) ×4 IMPLANT
KIT SUCTION CATH 14FR (SUCTIONS) ×4 IMPLANT
KIT SUT CK MINI COMBO 4X17 (Prosthesis & Implant Heart) ×1 IMPLANT
KIT TURNOVER KIT B (KITS) ×4 IMPLANT
KIT VASOVIEW HEMOPRO 2 VH 4000 (KITS) ×4 IMPLANT
LINE VENT (MISCELLANEOUS) ×1 IMPLANT
LOOP VESSEL SUPERMAXI WHITE (MISCELLANEOUS) ×3 IMPLANT
NS IRRIG 1000ML POUR BTL (IV SOLUTION) ×21 IMPLANT
PACK E OPEN HEART (SUTURE) ×4 IMPLANT
PACK OPEN HEART (CUSTOM PROCEDURE TRAY) ×4 IMPLANT
PAD ARMBOARD 7.5X6 YLW CONV (MISCELLANEOUS) ×8 IMPLANT
PAD ELECT DEFIB RADIOL ZOLL (MISCELLANEOUS) ×4 IMPLANT
PENCIL BUTTON HOLSTER BLD 10FT (ELECTRODE) ×4 IMPLANT
POSITIONER HEAD DONUT 9IN (MISCELLANEOUS) ×4 IMPLANT
PUNCH AORTIC ROTATE 4.0MM (MISCELLANEOUS) IMPLANT
PUNCH AORTIC ROTATE 4.5MM 8IN (MISCELLANEOUS) ×5 IMPLANT
PUNCH AORTIC ROTATE 5MM 8IN (MISCELLANEOUS) IMPLANT
SET MPS 3-ND DEL (MISCELLANEOUS) ×1 IMPLANT
SOL ANTI FOG 6CC (MISCELLANEOUS) IMPLANT
SOLUTION ANTI FOG 6CC (MISCELLANEOUS) ×1
SPONGE INTESTINAL PEANUT (DISPOSABLE) IMPLANT
SPONGE T-LAP 18X18 ~~LOC~~+RFID (SPONGE) ×6 IMPLANT
SPONGE T-LAP 4X18 ~~LOC~~+RFID (SPONGE) ×4 IMPLANT
STRIP CLOSURE SKIN 1/2X4 (GAUZE/BANDAGES/DRESSINGS) ×1 IMPLANT
SUPPORT HEART JANKE-BARRON (MISCELLANEOUS) ×4 IMPLANT
SUT BONE WAX W31G (SUTURE) ×4 IMPLANT
SUT EB EXC GRN/WHT 2-0 V-5 (SUTURE) ×2 IMPLANT
SUT ETHIBOND 2 0 SH (SUTURE) ×12 IMPLANT
SUT ETHIBOND 2 0 SH 36X2 (SUTURE) ×3 IMPLANT
SUT ETHIBOND 2 0 V4 (SUTURE) IMPLANT
SUT ETHIBOND 2 0V4 GREEN (SUTURE) IMPLANT
SUT MNCRL AB 4-0 PS2 18 (SUTURE) ×1 IMPLANT
SUT PROLENE 3 0 SH 48 (SUTURE) ×16 IMPLANT
SUT PROLENE 3 0 SH DA (SUTURE) ×3 IMPLANT
SUT PROLENE 3 0 SH1 36 (SUTURE) ×6 IMPLANT
SUT PROLENE 4 0 RB 1 (SUTURE) ×9
SUT PROLENE 4 0 SH DA (SUTURE) IMPLANT
SUT PROLENE 4-0 RB1 .5 CRCL 36 (SUTURE) ×6 IMPLANT
SUT PROLENE 5 0 C 1 36 (SUTURE) IMPLANT
SUT PROLENE 6 0 C 1 30 (SUTURE) IMPLANT
SUT PROLENE 7 0 BV 1 (SUTURE) IMPLANT
SUT PROLENE 7 0 BV1 MDA (SUTURE) ×4 IMPLANT
SUT PROLENE 8 0 BV175 6 (SUTURE) IMPLANT
SUT SILK  1 MH (SUTURE) ×6
SUT SILK 1 MH (SUTURE) IMPLANT
SUT SILK 2 0 SH (SUTURE) IMPLANT
SUT STEEL STERNAL CCS#1 18IN (SUTURE) ×1 IMPLANT
SUT STEEL SZ 6 DBL 3X14 BALL (SUTURE) ×2 IMPLANT
SUT VIC AB 1 CTX 36 (SUTURE) ×8
SUT VIC AB 1 CTX36XBRD ANBCTR (SUTURE) ×6 IMPLANT
SUT VIC AB 2-0 CT1 27 (SUTURE) ×3
SUT VIC AB 2-0 CT1 TAPERPNT 27 (SUTURE) IMPLANT
SUT VIC AB 2-0 CTX 27 (SUTURE) IMPLANT
SUT VIC AB 3-0 SH 27 (SUTURE)
SUT VIC AB 3-0 SH 27X BRD (SUTURE) IMPLANT
SUT VIC AB 3-0 X1 27 (SUTURE) IMPLANT
SUT VICRYL 4-0 PS2 18IN ABS (SUTURE) IMPLANT
SYSTEM SAHARA CHEST DRAIN ATS (WOUND CARE) ×4 IMPLANT
TABLE PACK (MISCELLANEOUS) ×1 IMPLANT
TAPE CLOTH SURG 4X10 WHT LF (GAUZE/BANDAGES/DRESSINGS) ×1 IMPLANT
TAPE PAPER 2X10 WHT MICROPORE (GAUZE/BANDAGES/DRESSINGS) ×1 IMPLANT
TOWEL GREEN STERILE (TOWEL DISPOSABLE) ×4 IMPLANT
TOWEL GREEN STERILE FF (TOWEL DISPOSABLE) ×4 IMPLANT
TRAY FOLEY SLVR 16FR TEMP STAT (SET/KITS/TRAYS/PACK) ×4 IMPLANT
TUBING LAP HI FLOW INSUFFLATIO (TUBING) ×4 IMPLANT
UNDERPAD 30X36 HEAVY ABSORB (UNDERPADS AND DIAPERS) ×4 IMPLANT
VALVE AORTIC ASCENDING 27/29 (Prosthesis & Implant Heart) IMPLANT
VRC MALLEABLE SINGLE STG 36FR (MISCELLANEOUS) ×4
WATER STERILE IRR 1000ML POUR (IV SOLUTION) ×8 IMPLANT
YANKAUER SUCT BULB TIP NO VENT (SUCTIONS) ×1 IMPLANT

## 2021-02-17 NOTE — Progress Notes (Signed)
OR staff on the floor to get patient, heparin drip stopped per order. Called Anesthesia for report but could not reach out, paged. Will give bedside report.

## 2021-02-17 NOTE — Anesthesia Procedure Notes (Signed)
Procedure Name: Intubation Date/Time: 02/17/2021 7:50 AM Performed by: Carolan Clines, CRNA Pre-anesthesia Checklist: Patient identified, Emergency Drugs available, Suction available and Patient being monitored Patient Re-evaluated:Patient Re-evaluated prior to induction Oxygen Delivery Method: Circle System Utilized Preoxygenation: Pre-oxygenation with 100% oxygen Induction Type: IV induction Ventilation: Mask ventilation without difficulty Laryngoscope Size: Mac and 4 Grade View: Grade I Tube type: Oral Tube size: 8.0 mm Number of attempts: 1 Airway Equipment and Method: Stylet Placement Confirmation: ETT inserted through vocal cords under direct vision, positive ETCO2 and breath sounds checked- equal and bilateral Secured at: 23 cm Tube secured with: Tape Dental Injury: Teeth and Oropharynx as per pre-operative assessment

## 2021-02-17 NOTE — Brief Op Note (Signed)
01/27/2021 - 02/17/2021  7:43 AM  PATIENT:  Mark Garrett  40 y.o. male  PRE-OPERATIVE DIAGNOSIS:  SEVERE MITRAL REGURGITATION MITRAL VALVE ENDOCARDITIS  CORONARY ARTERY DISEASE  POST-OPERATIVE DIAGNOSIS:  SEVERE MITRAL REGURGITATION MITRAL VALVE ENDOCARDITIS  CORONARY ARTERY DISEASE  PROCEDURE:  Procedure(s):  REDO STERNOTOMY (N/A)  MITRAL VALVE (MV) REPLACEMENT  -27/29 mm On-x Mechanical Valve  CORONARY ARTERY BYPASS GRAFTING x 1 -SVG to Ramus Intermediate  ENDOSCOPIC HARVEST GREATER SAPHENOUS VEIN -Right Thigh  TRANSESOPHAGEAL ECHOCARDIOGRAM (TEE) (N/A)  APPLICATION OF CELL SAVER  SURGEON:  Surgeon(s) and Role:    * Bartle, Payton Doughty, MD - Primary  PHYSICIAN ASSISTANT: Lowella Dandy PA-C  ASSISTANTS: Elissa Lovett RNFA   ANESTHESIA:   general  EBL:  Per Anesthesia Record  BLOOD ADMINISTERED:   CC CELLSAVER  DRAINS:  Mediastinal Chest Drains    LOCAL MEDICATIONS USED:  NONE  SPECIMEN:  Source of Specimen:  Mitral Valve Leaflets  DISPOSITION OF SPECIMEN:   Microbiology  COUNTS:  YES  TOURNIQUET:  * No tourniquets in log *  DICTATION: .Dragon Dictation  PLAN OF CARE: Admit to inpatient   PATIENT DISPOSITION:  ICU - intubated and hemodynamically stable.   Delay start of Pharmacological VTE agent (>24hrs) due to surgical blood loss or risk of bleeding: yes

## 2021-02-17 NOTE — Progress Notes (Signed)
      [address]       [place] [address]             [phone]      S/p CABG x 1, MVR  Intubated, sedated  BP (!) 107/59 (BP Location: Left Arm)   Pulse 64   Temp 99.5 F (37.5 C)   Resp 20   Ht 5\' 11"  (1.803 m)   Wt 70.2 kg   SpO2 100%   BMI 21.58 kg/m   55/17 CI= 2.4 on Milrinone 0.375, epi 5, norepi 10  Extremities cool, baseline ABIs 0.4-0.5   Intake/Output Summary (Last 24 hours) at 02/17/2021 1801 Last data filed at 02/17/2021 1700 Gross per 24 hour  Intake 4414.93 ml  Output 1730 ml  Net 2684.93 ml   Minimal CT output  ABG on IMV 20/50%/ 5 PEEP, TV 600 ml 7.26/60/178 Vent rate up to 26, will give albuterol  10-12-2002 C. Viviann Spare, MD [facility] [phone]

## 2021-02-17 NOTE — TOC Progression Note (Signed)
Transition of Care [facility]) - Progression Note    Patient Details  Name: Mark Garrett MRN: [id number] Date of Birth: 1980/04/28  Transition of Care [facility]) CM/SW Contact  Amanee Iacovelli, LCSWA Phone Number: 02/17/2021, 4:19 PM  Clinical Narrative:    [facility] CSW spoke with [name], Narda Bonds [phone] to follow up from the outpatient CSW's conversation. Rosey Bath reported that [name] would be able to discharge to her home and that she will need a hospital bed, bed pan/3in1, and a rollator. [name]'s address is [address] [place] [address]. Awaiting to see about home IV abx is needed. Rosey Bath is wondering about the CSW helping [name] with disability and CSW will follow up with disability tomorrow with [name] surgery today.  CSW will continue to follow throughout discharge.   Expected Discharge Plan: Home/Self Care Barriers to Discharge: Continued Medical Work up  Expected Discharge Plan and Services Expected Discharge Plan: Home/Self Care In-house Referral: Clinical Social Work Discharge Planning Services: CM Consult, Indigent Health Clinic, MATCH Program, Medication Assistance, Follow-up appt scheduled   Living arrangements for the past 2 months: Single Family Home                   DME Agency: NA       HH Arranged: NA           Social Determinants of Health (SDOH) Interventions Food Insecurity Interventions: Other (Comment) (Patient has food stamps $250 a month) Financial Strain Interventions: Intervention Not Indicated, Artist (referral to [employer] to screen for Medicaid, patient agreeable) Housing Interventions: Intervention Not Indicated Transportation Interventions: Retail banker  Readmission Risk Interventions No flowsheet data found.  Rosaland Shiffman, MSW, LCSWA [phone] Heart Failure Social Worker

## 2021-02-17 NOTE — Progress Notes (Signed)
Patient transferred to OR via stretcher with OR staff and this RN, milrinone drip running at 9.53ml/hr, Bedside report given to CRNA Blare . CCMD called and notified of pt transfer.

## 2021-02-17 NOTE — Progress Notes (Signed)
Patient ID: Mark Garrett, male   DOB: 07-Oct-1980, 40 y.o.   MRN: [id number]       Advanced Heart Failure Rounding Note  PCP-Cardiologist: None   Subjective:    12/1: Mechanical On-X mitral valve, CABG x 1 with SVG-ramus.  Extensive vegetation noted on excised valve.   Patient recently returned from OR, MAP currently stable in 70s on milrinone 0.375, epinephrine 8, phenylephrine 20, and NE 6.  CI 2.69 from [place].    Hyperkalemic and acidotic, vent settings changed and HCO3 given.   TEE (11/16): EF 35-40%, there was mobile vegetation on both the anterior and posterior mitral leaflets.  The leaflets did not completely coapt (?destruction from endocarditis) with severe mitral regurgitation (ERO 0.56 cm^2).  There was extensive plaque in the proximal descending thoracic aorta with what appears to be mobile vegetation attached to it.   LHC/RHC:   Coronary Findings  Diagnostic Dominance: Right Left Anterior Descending  Mid LAD lesion is 95% stenosed.  Ramus Intermedius  Ramus-1 lesion is 50% stenosed.  Ramus-2 lesion is 80% stenosed.  Right Coronary Artery  Mid RCA lesion is 30% stenosed.  Graft To PLV  Origin lesion is 100% stenosed.  LIMA Graft To Dist LAD  Intervention  No interventions have been documented. Right Heart  Right Heart Pressures RHC Procedural Findings: Hemodynamics (mmHg) on milrinone 0.375 RA mean 18 RV 69/20 PA 76/25, mean 44 PCWP mean 22, v-waves to 37 mmHg LV 100/23 AO 100/60  Oxygen saturations: PA 58% AO 97%  Cardiac Output (Fick) 5.33  Cardiac Index (Fick) 2.67 PVR 4.1 WU  Cardiac Output (Thermo) 4.89 Cardiac Index (Thermo) 2.45  PVR 4.5 WU  PAPI: 2.8    Objective:   Weight Range: 70.2 kg Body mass index is 21.58 kg/m.   Vital Signs:   Temp:  [98.2 F (36.8 C)-99.3 F (37.4 C)] 99 F (37.2 C) (12/01 1545) Pulse Rate:  [60-81] 63 (12/01 1545) Resp:  [8-20] 18 (12/01 1545) BP: (66-126)/(55-83) 91/56 (12/01 1545) SpO2:  [60  %-100 %] 100 % (12/01 1545) Arterial Line BP: (71-114)/(42-60) 114/55 (12/01 1545) FiO2 (%):  [50 %] 50 % (12/01 1504) Weight:  [70.2 kg] 70.2 kg (12/01 0608) Last BM Date: 02/15/21  Weight change: Filed Weights   02/15/21 0824 02/16/21 0504 02/17/21 0608  Weight: 71 kg 69.9 kg 70.2 kg    Intake/Output:   Intake/Output Summary (Last 24 hours) at 02/17/2021 1611 Last data filed at 02/17/2021 1500 Gross per 24 hour  Intake 4048.64 ml  Output 1295 ml  Net 2753.64 ml      Physical Exam   General: Intubated/sedated.  Neck: No JVD, no thyromegaly or thyroid nodule.  Lungs: Clear to auscultation bilaterally with normal respiratory effort. CV: Nondisplaced PMI.  Heart regular S1/S2 with mechanical S1, no S3/S4, no murmur.  No peripheral edema.   Abdomen: Soft, nontender, no hepatosplenomegaly, no distention.  Skin: Intact without lesions or rashes.  Neurologic: Sedated on vent.  Extremities: No clubbing or cyanosis. S/p traumatic amputation left foot.  HEENT: Normal.    Telemetry   SR 70s (personally reviewed)   Labs    CBC Recent Labs    02/17/21 0340 02/17/21 0809 02/17/21 1230 02/17/21 1256 02/17/21 1430 02/17/21 1448 02/17/21 1558  WBC 17.0*  --   --   --  33.1*  --   --   HGB 11.3*   < > 7.7*   < > 9.5* 11.6* 10.2*  HCT 37.9*   < > 26.1*   < >  33.5* 34.0* 30.0*  MCV 78.0*  --   --   --  80.7  --   --   PLT 208  --  164  --  198  --   --    < > = values in this interval not displayed.   Basic Metabolic Panel Recent Labs    02/16/21 0500 02/17/21 0340 02/17/21 0809 02/17/21 1202 02/17/21 1256 02/17/21 1303 02/17/21 1359 02/17/21 1448 02/17/21 1558  NA 133* 133*   < > 130*   < > 131*   < > 134* 135  K 3.6 4.7   < > 5.7*   < > 6.4*   < > 5.5* 5.5*  CL 102 94*   < > 96*  --  104  --   --   --   CO2 24 27  --   --   --   --   --   --   --   GLUCOSE 89 114*   < > 105*  --  105*  --   --   --   BUN 19 27*   < > 29*  --  37*  --   --   --   CREATININE  0.76 1.13   < > 1.10  --  1.20  --   --   --   CALCIUM 7.8* 9.7  --   --   --   --   --   --   --   MG 2.0 2.3  --   --   --   --   --   --   --    < > = values in this interval not displayed.   Liver Function Tests No results for input(s): AST, ALT, ALKPHOS, BILITOT, PROT, ALBUMIN in the last 72 hours.  No results for input(s): LIPASE, AMYLASE in the last 72 hours. Cardiac Enzymes No results for input(s): CKTOTAL, CKMB, CKMBINDEX, TROPONINI in the last 72 hours.   BNP: BNP (last 3 results) Recent Labs    01/27/21 1034  BNP 1,733.0*    ProBNP (last 3 results) No results for input(s): PROBNP in the last 8760 hours.   D-Dimer No results for input(s): DDIMER in the last 72 hours. Hemoglobin A1C Recent Labs    02/16/21 2033  HGBA1C 4.7*   Fasting Lipid Panel No results for input(s): CHOL, HDL, LDLCALC, TRIG, CHOLHDL, LDLDIRECT in the last 72 hours.  Thyroid Function Tests No results for input(s): TSH, T4TOTAL, T3FREE, THYROIDAB in the last 72 hours.  Invalid input(s): FREET3  Other results:   Imaging    DG Chest 2 View  Result Date: 02/16/2021 CLINICAL DATA:  Preoperative evaluation. EXAM: CHEST - 2 VIEW COMPARISON:  January 29, 2021 FINDINGS: Multiple sternal wires are noted. There is stable left internal jugular venous catheter positioning. A stable ill-defined opacity is seen within the lateral aspect of the mid right lung. There is no evidence of a pleural effusion or pneumothorax. The cardiac silhouette is mildly enlarged and unchanged in size. A chronic fracture is seen involving the mid right clavicle. IMPRESSION: 1. Evidence of prior median sternotomy/CABG. 2. Stable area of airspace disease within the lateral aspect of the mid to lower right lung. Electronically Signed   By: Virgina Norfolk M.D.   On: 02/16/2021 21:38   DG Chest Port 1 View  Result Date: 02/17/2021 CLINICAL DATA:  Status post mitral valve replacement. EXAM: PORTABLE CHEST 1 VIEW  COMPARISON:  One day prior  FINDINGS: 2 AP portable radiographs with exclusion of the right costophrenic angle. Repeat median sternotomy. Endotracheal tube terminates 6.9 cm above carina. Nasogastric tube terminates at the body of the stomach with the side port just below the gastroesophageal junction. Left internal jugular line tip likely at the central left brachiocephalic vein. Mediastinal drain. Right internal jugular Swan with tip at pulmonary outflow tract. Three left and 1 right-sided chest tube. Midline trachea. Moderate cardiomegaly. No left and no large right pleural effusion. No pneumothorax. No congestive failure. Lateral right lung airspace disease is similar. IMPRESSION: Support apparatus as detailed above. No pneumothorax or other acute complication. Similar lateral right lower lobe airspace disease. Electronically Signed   By: Abigail Miyamoto M.D.   On: 02/17/2021 15:25     Medications:     Scheduled Medications:  [START ON 02/18/2021] acetaminophen  1,000 mg Oral Q6H   Or   [START ON 02/18/2021] acetaminophen (TYLENOL) oral liquid 160 mg/5 mL  1,000 mg Per Tube Q6H   acetaminophen (TYLENOL) oral liquid 160 mg/5 mL  650 mg Per Tube Once   Or   acetaminophen  650 mg Rectal Once   [START ON 02/18/2021] aspirin EC  325 mg Oral Daily   Or   [START ON 02/18/2021] aspirin  324 mg Per Tube Daily   [START ON 02/18/2021] bisacodyl  10 mg Oral Daily   Or   [START ON 02/18/2021] bisacodyl  10 mg Rectal Daily   [START ON 02/18/2021] docusate sodium  200 mg Oral Daily   metoCLOPramide (REGLAN) injection  10 mg Intravenous Q6H   [START ON 02/19/2021] pantoprazole  40 mg Oral Daily   rosuvastatin  40 mg Oral Daily   [START ON 02/18/2021] sodium chloride flush  3 mL Intravenous Q12H    Infusions:  sodium chloride Stopped (02/17/21 1450)   [START ON 02/18/2021] sodium chloride     sodium chloride 10 mL/hr at 02/17/21 1435   albumin human     cefTRIAXone (ROCEPHIN)  IV Stopped (02/16/21 1951)    DAPTOmycin (CUBICIN)  IV Stopped (02/16/21 2305)   dexmedetomidine (PRECEDEX) IV infusion 0.7 mcg/kg/hr (02/17/21 1500)   epinephrine 8 mcg/min (02/17/21 1535)   famotidine (PEPCID) IV 100 mL/hr at 02/17/21 1500   insulin 1.2 Units/hr (02/17/21 1500)   lactated ringers     lactated ringers 20 mL/hr at 02/17/21 1435   lactated ringers 20 mL/hr at 02/17/21 1500   magnesium sulfate 20 mL/hr at 02/17/21 1500   milrinone 0.375 mcg/kg/min (02/17/21 1500)   nitroGLYCERIN     norepinephrine (LEVOPHED) Adult infusion 6 mcg/min (02/17/21 1500)   phenylephrine (NEO-SYNEPHRINE) Adult infusion 20 mcg/min (02/17/21 1435)   potassium chloride      PRN Medications: sodium chloride, albumin human, dextrose, lactated ringers, midazolam, morphine injection, ondansetron (ZOFRAN) IV, oxyCODONE, [START ON 02/18/2021] sodium chloride flush, traMADol    Assessment/Plan   1. Acute systolic CHF: Ischemic cardiomyopathy, symptoms worsened by severe MR.  Echo with EF 35-40%, severe hypokinesis inferior and inferolateral walls, moderate LV dilation with mild LVH, mild RV dilation with moderately decreased systolic function, restricted posterior mitral leaflet and calcified mitral valve with severe mitral regurgitation and at least mild mitral stenosis (mean gradient 8 mmHg), PASP 60. Per report, echo at [facility] showed EF 55-60% with severe MR in 10/22.  It was a technically difficult study per report.  It is possible that he had a new ACS event prior to admission with reported drop in EF though HS-TnI with mild elevation and no  trend suggests that it was not immediately prior to admission.  Low output noted with co-ox 39% initially, lactate elevated when he was initially admitted.  Milrinone begun with stabilization.  Now s/p mechanical MVR and SVG-ramus.  Stable cardiac index 2.69 and MAP on milrinone 0.375, epinephrine 8, phenylephrine 20, NE 6.  - Wean off phenylephrine 1st (can increase NE if needed).  - Follow Swan  indices.  2. CAD: S/p CABG 2006.  As above, with fall in EF and CHF exacerbation, possible ACS prior to admission.  However, mild HS-TnI elevation with no trend suggests that ACS was not immediately prior to admission.  Current elevation is likely demand ischemia from volume overload. LHC on 11/14 showed patent LIMA-LAD with SVG occluded at aorta (does not look new); there was complex 80% proximal ramus stenosis.  Initially assumed that the SVG was to the ramus, but subsequently got the CABG operative report from [place] and it looks like it was an SVG-PLV.  There was minimal native RCA disease. LAD territory well-supplied by LIMA. Now s/p SVG-ramus on 12/1.  - ASA 81  - Crestor 40 3.  Mitral valve disease: Initial echo showed posterior MV leaflet restricted with severe MR, suspected primarily infarct-related MR given LV dilation and inferior/inferolateral severe hypokinesis.  However, TEE on 11/16 showed vegetation (not bulky but clearly present) on the posterior and anterior leaflets with poor leaflet coaptation, suggesting endocarditis may be the major cause of severe MR.  Reviewed with ID, suspect the mitral vegetations have been present for a while (severe MR on 10/22 echo as well, does not appear to have active infection).  Cannot rule out nonbacterial thrombotic endocarditis (had recent DVT also), but somewhat academic at this point as he will be on anticoagulation for the non-triggered DVT and we are going to treat with antibiotic course given presentation and high WBCs. ANA negative. Extensive vegetation noted on MV at time of MVR, sent for cultures.  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   4. 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  ?due to sedentary lifestyle + ?genetic predisposition.  He is not a Xarelto failure, had DVT found in 10/22 with Xarelto started at this point, 11/22 dopplers showed partially treated DVTs.  - He will be anticoagulated with mechanical valve.  5. Smoking: Needs to quit.   6. PNA: Cultures 11/18 NGTD.  RML PNA on CT chest.  - He completed course of abx initially for PNA.  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 He is able to walk on the stump.  - PT following, continue to mobilize => walk daily in hall.  8. 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  There is also mobile vegetation that appears adherent to plaque in the proximal descending thoracic aorta.  CT head showed no evidence for embolic disease.  Possible source would be due to poor dentition. Blood cultures from 11/10 and 11/16 NGTD.  He was initially on abx for PNA.  We re-drew cultures on 11/16 and 11/24, all negative so far.  ESR is 1, CRP 1.7.  He had been already partially treated when we found the endocarditis (abx for PNA) and suspect that the endocarditis is old as he had severe MR also on echo in 10/22. ID following.  WBCs rose to >30 after teeth removed but now coming back down (13 today ).  Suspect worsening leukocytosis was a response to dental surgery. Extensive vegetation noted on MV at  time of surgery, sample sent for culture.   - Per ID, continue 6 wks of IV abx to 12/27 =>ceftriaxone and daptomycin.    9. Microcytic anemia: Stable hgb.  - Iron stores low, had Feraheme  10. Acute hypoxemic respiratory failure: Remains intubated post-op.  Hope to wean to extubation soon.   CRITICAL CARE Performed by: Loralie Champagne  Total critical care time: 35 minutes  Critical care time was exclusive of separately billable procedures and treating other patients.  Critical care was necessary to treat or prevent imminent or life-threatening deterioration.  Critical care was time spent personally by me on the following activities: development of treatment plan with patient and/or surrogate as well as nursing, discussions with consultants, evaluation of patient's response to treatment, examination of patient, obtaining history from patient or  surrogate, ordering and performing treatments and interventions, ordering and review of laboratory studies, ordering and review of radiographic studies, pulse oximetry and re-evaluation of patient's condition.   Length of Stay: 69  Loralie Champagne, MD  02/17/2021, 4:11 PM  Advanced Heart Failure Team Pager [phone] (M-F; 7a - 5p)  Please contact [facility] for night-coverage after hours (5p -7a ) and weekends on [email or web address]

## 2021-02-17 NOTE — Interval H&P Note (Signed)
History and Physical Interval Note:  02/17/2021 6:41 AM  Mark Garrett  has presented today for surgery, with the diagnosis of SEVERE MR MV ENDOCARDITIS  CAD.  The various methods of treatment have been discussed with the patient and family. After consideration of risks, benefits and other options for treatment, the patient has consented to  Procedure(s): REDO STERNOTOMY (N/A) MITRAL VALVE (MV) REPLACEMENT (N/A) CORONARY ARTERY BYPASS GRAFTING (CABG) (N/A) TRANSESOPHAGEAL ECHOCARDIOGRAM (TEE) (N/A) as a surgical intervention.  The patient's history has been reviewed, patient examined, no change in status, stable for surgery.  I have reviewed the patient's chart and labs.  Questions were answered to the patient's satisfaction.     Alleen Borne

## 2021-02-17 NOTE — Progress Notes (Signed)
  Echocardiogram Echocardiogram Transesophageal has been performed.  Mark Garrett 02/17/2021, 8:33 AM

## 2021-02-17 NOTE — Anesthesia Procedure Notes (Addendum)
Arterial Line Insertion Start/End12/03/2020 7:15 AM, 02/17/2021 7:20 AM Performed by: Cecile Hearing, MD, anesthesiologist  Patient location: Pre-op. Preanesthetic checklist: patient identified, IV checked, site marked, risks and benefits discussed, surgical consent, monitors and equipment checked, pre-op evaluation, timeout performed and anesthesia consent Lidocaine 1% used for infiltration Left, radial was placed Catheter size: 20 G Hand hygiene performed  and maximum sterile barriers used   Attempts: 2 Procedure performed using ultrasound guided technique. Ultrasound Notes:anatomy identified, needle tip was noted to be adjacent to the nerve/plexus identified and no ultrasound evidence of intravascular and/or intraneural injection Following insertion, dressing applied and Biopatch. Post procedure assessment: normal and unchanged  Post procedure complications: second provider assisted. Patient tolerated the procedure well with no immediate complications. Additional procedure comments: Attempt x 1 by CRNA.

## 2021-02-17 NOTE — Transfer of Care (Signed)
Immediate Anesthesia Transfer of Care Note  Patient: Mark Garrett  Procedure(s) Performed: REDO STERNOTOMY MITRAL VALVE (MV) REPLACEMENT USING ON-X 27/29 MM PROSTHETIC MITRAL VALVE (Chest) CORONARY ARTERY BYPASS GRAFTING (CABG), ON PUMP, TIMES ONE, USING ENDOSCOPICALLY HARVESTED RIGHT GREATER SAPHENOUS VEIN (Chest) TRANSESOPHAGEAL ECHOCARDIOGRAM (TEE) APPLICATION OF CELL SAVER  Patient Location: ICU  Anesthesia Type:General  Level of Consciousness: sedated and Patient remains intubated per anesthesia plan  Airway & Oxygen Therapy: Patient remains intubated per anesthesia plan and Patient placed on Ventilator (see vital sign flow sheet for setting)  Post-op Assessment: Report given to RN and Post -op Vital signs reviewed and stable  Post vital signs: Reviewed and stable  Last Vitals:  Vitals Value Taken Time  BP 94/56 02/17/21 1431  Temp 37.3 C 02/17/21 1440  Pulse 80 02/17/21 1431  Resp 17 02/17/21 1440  SpO2    Vitals shown include unvalidated device data.  Last Pain:  Vitals:   02/17/21 0513  TempSrc: Oral  PainSc:       Patients Stated Pain Goal: 0 (02/08/21 1256)  Complications: No notable events documented.

## 2021-02-17 NOTE — Anesthesia Procedure Notes (Signed)
Central Venous Catheter Insertion Performed by: Catalina Gravel, MD, anesthesiologist Start/End12/03/2020 7:00 AM, 02/17/2021 7:10 AM Patient location: Pre-op. Preanesthetic checklist: patient identified, IV checked, site marked, risks and benefits discussed, surgical consent, monitors and equipment checked, pre-op evaluation, timeout performed and anesthesia consent Position: Trendelenburg Lidocaine 1% used for infiltration and patient sedated Hand hygiene performed , maximum sterile barriers used  and Seldinger technique used Catheter size: 9 Fr Central line was placed.MAC introducer Procedure performed using ultrasound guided technique. Ultrasound Notes:anatomy identified, needle tip was noted to be adjacent to the nerve/plexus identified, no ultrasound evidence of intravascular and/or intraneural injection and image(s) printed for medical record Attempts: 1 Following insertion, line sutured, dressing applied and Biopatch. Post procedure assessment: free fluid flow, blood return through all ports and no air  Patient tolerated the procedure well with no immediate complications.

## 2021-02-17 NOTE — Plan of Care (Signed)
  Problem: Education: Goal: Knowledge of General Education information will improve Description: Including pain rating scale, medication(s)/side effects and non-pharmacologic comfort measures Outcome: Progressing   Problem: Health Behavior/Discharge Planning: Goal: Ability to manage health-related needs will improve Outcome: Progressing   Problem: Clinical Measurements: Goal: Ability to maintain clinical measurements within normal limits will improve Outcome: Progressing Goal: Will remain free from infection Outcome: Progressing Goal: Diagnostic test results will improve Outcome: Progressing Goal: Respiratory complications will improve Outcome: Progressing Goal: Cardiovascular complication will be avoided Outcome: Progressing   Problem: Activity: Goal: Risk for activity intolerance will decrease Outcome: Progressing   Problem: Nutrition: Goal: Adequate nutrition will be maintained Outcome: Progressing   Problem: Coping: Goal: Level of anxiety will decrease Outcome: Progressing   Problem: Elimination: Goal: Will not experience complications related to bowel motility Outcome: Progressing Goal: Will not experience complications related to urinary retention Outcome: Progressing   Problem: Pain Managment: Goal: General experience of comfort will improve Outcome: Progressing   Problem: Safety: Goal: Ability to remain free from injury will improve Outcome: Progressing   Problem: Skin Integrity: Goal: Risk for impaired skin integrity will decrease Outcome: Progressing   Problem: Education: Goal: Understanding of CV disease, CV risk reduction, and recovery process will improve Outcome: Progressing Goal: Individualized Educational Video(s) Outcome: Progressing   Problem: Activity: Goal: Ability to return to baseline activity level will improve Outcome: Progressing   Problem: Cardiovascular: Goal: Ability to achieve and maintain adequate cardiovascular perfusion  will improve Outcome: Progressing Goal: Vascular access site(s) Level 0-1 will be maintained Outcome: Progressing   Problem: Health Behavior/Discharge Planning: Goal: Ability to safely manage health-related needs after discharge will improve Outcome: Progressing   Problem: Education: Goal: Ability to demonstrate management of disease process will improve Outcome: Progressing Goal: Ability to verbalize understanding of medication therapies will improve Outcome: Progressing Goal: Individualized Educational Video(s) Outcome: Progressing   Problem: Activity: Goal: Capacity to carry out activities will improve Outcome: Progressing   Problem: Cardiac: Goal: Ability to achieve and maintain adequate cardiopulmonary perfusion will improve Outcome: Progressing   

## 2021-02-17 NOTE — Hospital Course (Addendum)
History of Present Illness:  The patient is a 40 year old gentleman with a history of coronary disease status post CABG in [place] in 2006, ischemic cardiomyopathy with severe mitral regurgitation felt to be due to endocarditis, active smoking, and recent DVT who reports feeling fine until shortly before he presented to [facility] in October 2022 with bilateral DVTs and started on Xarelto.  An echo at that time reportedly showed an ejection fraction of 55 to 60% with anterior hypokinesis and severe mitral regurgitation.  He was then admitted to [facility] on 01/27/2021 with chest pain, leg swelling, cough, and hypoxemia.  He was COVID and influenza negative.  Lactate level initially was 3.8.  CTA of the chest showed no evidence of pulmonary embolism but showed right middle lobe pneumonia and a moderate sized right pleural effusion.  BNP was 1733.  High-sensitivity troponin was mildly elevated with a flat trend.  Ultrasound of his legs showed bilateral DVTs that were age-indeterminate.  He was treated with diuretic and ceftriaxone/azithromycin.  He developed nonsustained VT and was started on amiodarone.  A 2D echocardiogram done here showed an ejection fraction of 35 to 40% with severe hypokinesis of the inferior and inferolateral walls with moderate LV dilatation.  There is mild RV dilation with moderately decreased RV systolic function.  The posterior mitral leaflet was restricted with calcification of the mitral valve and severe mitral regurgitation.  There is felt to be at least mild mitral stenosis with a mean gradient of 8 mmHg.  PA systolic pressure is estimated at 60.  He had a central line placed and a Co-ox was 39%. He was started on milrinone 0.25 which has subsequently been increased to 0.375.  He underwent cardiac catheterization on 01/31/2021 which showed a 95% mid LAD stenosis with a patent left internal mammary graft filling the distal vessel.  The large ramus branch has about 80%  stenosis.  The right coronary artery was a large dominant vessel with mild nonobstructive disease.  A vein graft off the aorta was completely occluded and it was felt that this probably went to the ramus branch although Dr. Shirlee Latch reviewed the operative note from [place] and this vein graft was to a posterolateral branch.  PA pressure was elevated at 76/25 with a mean of 44.  Wedge pressure was 22 with a V wave of 37.  Aortic pressure 100/60.  PA saturation was 58% with arterial saturation 97%.  Cardiac index was 2.67 with a pulmonary vascular resistance at 4.1 Wood units.  PAPI was 2.8.  A TEE was performed on 02/02/2021 which showed mobile vegetation on both the anterior and posterior mitral valve leaflets which did not completely coapt.  There was some destruction of the leaflets.  There was felt to be severe mitral regurgitation.  Left ventricular ejection fraction was 35 to 40% with mild dilation of the LV cavity.  RV systolic function was mildly reduced.  The aortic valve is trileaflet with trivial regurgitation and no stenosis.  There are no vegetation seen on the aortic valve.  Tricuspid valve has mild regurgitation with no vegetation seen. He was seen by dental medicine and extraction of multiple teeth was indicated to decrease the risk of perioperative and postoperative infection.  Cardiothoracic surgery consultation was requested and the patient was evaluated by Dr. Laneta Simmers who was in agreement the patient would benefit from surgical intervention.  The risks and benefits of the procedure were explained to the patient and he was agreeable to proceed.  Post  operative hospital course:  The patient was taken to the operating room on 02/17/2021.  He underwent Redo Sternotomy, Mitral Valve Replacement with a 27/29 mm Ony-X Mechanical Valve, and CABG x 1 utilizing SVG to Ramus Intermediate.  He also underwent endoscopic harvest of greater saphenous vein from his right thigh.  He tolerated the procedure  without difficulty and was taken to the SICU in stable condition.  The patient required Levophed, Neo-synephrine, Epinephrine and Milrinone post operatively. He was unable to be expected the evening of surgery.  He was weaned off the ventilator and extubated on 02/18/2021.  He was weaned off the Insulin drip. His pre op HGA1C was 4.7. Accu checks and SS PRN were stopped. He was hyperkalemic at 5.6 and was treated with [facility].  He was volume overloaded and IV diuretics were initiated.  His chest tube output was low.  His chest tubes were removed on 12/04. He had anemia post op and did require a transfusion on 12/04. He was started on Sildenafil for PAH. He was started on Coumadin and his PT and INR were monitored daily. On 12/06, INR was slightly decreased to 2. Coumadin was increased to 2.5 mg daily. Patient's acute systolic hear failure was managed by advanced heart failure. Spironolactone was added on 12/04 and Entresto on 12/05. Per advanced heart failure, Spironolactone was increased to 25 mg daily on 12/06. Code stroke was called the morning of 12/06 as patient had complaints of not being able to feel his right arm or leg. Neurology consulted and CT of head, MR angio, and MRI of brain done. Per neurology, MRI Brain demonstrated a small R frontal white matter stroke which appears subacute and does not explain his R sided symptoms. It was felt the episode may have been a TIA or ?hypoxia. Neurology recommendations were followed accordingly. Because his INR has not increased much, he was given 5 mg of Coumadin on 12/08. INR on 12/09 was 3.0 so the Coumadin dose was decreased to 3 mg.  By the following morning, the INR was 3.8.  It was decided to hold the Coumadin dosing and monitor.  On 02/27/2021, the INR was 3.6 and the Coumadin was held again. As of 12/12, he is on Toprol XL 12.5 mg daily, Digoxin, 0.125 mg daily, and Sildenafil 40 mg tid for PAH. In terms of diuresis, he is on Spironolactone 25 mg daily. INR  went down to 2.7 on 12/13. It was felt Coumadin 3 mg would be dose needed.

## 2021-02-17 NOTE — Op Note (Signed)
CARDIOVASCULAR SURGERY OPERATIVE NOTE  02/17/2021  Surgeon:  Alleen Borne, MD  First Assistant: Lowella Dandy,  PA-C:   An experienced assistant was required given the complexity of this surgery and the standard of surgical care. The assistant was needed for exposure, dissection, suctioning, retraction of delicate tissues and sutures, instrument exchange and for overall help during this procedure.   Preoperative Diagnosis:  Severe multi-vessel coronary artery disease, severe mitral regurgitation.   Postoperative Diagnosis:  Same   Procedure:  Redo Median Sternotomy Extracorporeal circulation 3.   Redo Coronary artery bypass grafting x 1  SVG to Ramus    4.   Endoscopic vein harvest from the right leg 5.   Mitral valve replacement using a 27/29 mm On-X mechanical valve   Anesthesia:  General Endotracheal   Clinical History/Surgical Indication:  The patient is a 40 year old gentleman with a history of coronary disease status post CABG in [place] in 2006, ischemic cardiomyopathy with severe mitral regurgitation felt to be due to endocarditis, active smoking, and recent DVT who reports feeling fine until shortly before he presented to [facility] in October 2022 with bilateral DVTs and started on Xarelto.  An echo at that time reportedly showed an ejection fraction of 55 to 60% with anterior hypokinesis and severe mitral regurgitation.  He was then admitted to [facility] on 01/27/2021 with chest pain, leg swelling, cough, and hypoxemia.  He was COVID and influenza negative.  Lactate level initially was 3.8.  CTA of the chest showed no evidence of pulmonary embolism but showed right middle lobe pneumonia and a moderate sized right pleural effusion.  BNP was 1733.  High-sensitivity troponin was mildly elevated with a flat trend.  Ultrasound of his legs showed bilateral DVTs that were  age-indeterminate.  He was treated with diuretic and ceftriaxone/azithromycin.  He developed nonsustained VT and was started on amiodarone.  A 2D echocardiogram done here showed an ejection fraction of 35 to 40% with severe hypokinesis of the inferior and inferolateral walls with moderate LV dilatation.  There is mild RV dilation with moderately decreased RV systolic function.  The posterior mitral leaflet was restricted with calcification of the mitral valve and severe mitral regurgitation.  There is felt to be at least mild mitral stenosis with a mean gradient of 8 mmHg.  PA systolic pressure is estimated at 60.  He had a central line placed and a Co-ox was 39%. He was started on milrinone 0.25 which has subsequently been increased to 0.375.  He underwent cardiac catheterization on 01/31/2021 which showed a 95% mid LAD stenosis with a patent left internal mammary graft filling the distal vessel.  The large ramus branch has about 80% stenosis.  The right coronary artery was a large dominant vessel with mild nonobstructive disease.  A vein graft off the aorta was completely occluded and it was felt that this probably went to the ramus branch although Dr. Shirlee Latch reviewed the operative note from [place] and this vein graft was to a posterolateral branch.  PA pressure was elevated at 76/25 with a mean of 44.  Wedge pressure was 22 with a V wave of 37.  Aortic pressure 100/60.  PA saturation was 58% with arterial saturation 97%.  Cardiac index was 2.67 with a pulmonary vascular resistance at 4.1 Wood units.  PAPI was 2.8.  A TEE was performed yesterday which showed mobile vegetation on both the anterior and posterior mitral valve leaflets which did not completely coapt.  There was some  destruction of the leaflets.  There was felt to be severe mitral regurgitation.  Left ventricular ejection fraction was 35 to 40% with mild dilation of the LV cavity.  RV systolic function was mildly reduced.  The aortic valve is  trileaflet with trivial regurgitation and no stenosis.  There are no vegetation seen on the aortic valve.  Tricuspid valve has mild regurgitation with no vegetation seen. He remained stable on milrinone 0.375, digoxin and diuretic therapy. He was seen by dental medicine and had 10 teeth extracted.  I discussed the operative procedure of CABG to the Ramus branch and MVR with the patient  including alternatives, benefits and risks; including but not limited to bleeding, blood transfusion, infection, stroke, myocardial infarction, graft failure, heart block requiring a permanent pacemaker, organ dysfunction, and death. I discussed the options of bioprosthetic and mechanical valve prostheses. Given his young age and redo surgery I thought that a mechanical valve would be his best option. I discussed the need for lifelong anticoagulation with Coumadin and the risks of that. He had no contraindications to anticoagulation and felt that he could take Coumadin reliably and maintain medical followup.  Bradly Bienenstock Fuertes understands and agrees to proceed with CABG and MVR using a mechanical valve.   Preparation:  The patient was seen in the preoperative holding area and the correct patient, correct operation were confirmed with the patient after reviewing the medical record and catheterization. The consent was signed by me. Preoperative antibiotics were given. A pulmonary arterial line and radial arterial line were placed by the anesthesia team. The patient was taken back to the operating room and positioned supine on the operating room table. After being placed under general endotracheal anesthesia by the anesthesia team a foley catheter was placed. The neck, chest, abdomen, and both legs were prepped with betadine soap and solution and draped in the usual sterile manner. A surgical time-out was taken and the correct patient and operative procedure were confirmed with the nursing and anesthesia staff.   Cardiopulmonary  Bypass:  A median sternotomy was performed. The sternum was opened with the oscillating saw without difficulty. The sternal edges were retracted with bone hooks and the heart was dissected from the sternum using electrocautery. The pericardium was not closed at the initial surgery. Dissection was performed to expose the right atrium and ascending aorta.  Right ventricular function appeared normal. The ascending aorta was of normal size and had no palpable plaque. There were no contraindications to aortic cannulation or cross-clamping. The patient was fully systemically heparinized and the ACT was maintained > 400 sec. The proximal aortic arch was cannulated with a 91F aortic cannula for arterial inflow. Bi-caval venous cannulation was performed using a 19F metal tip right angle cannula in the SVC and a 63F plastic malleable cannula in the IVC. An antegrade cardioplegia/vent cannula was inserted into the mid-ascending aorta. A retrograde cardioplegia cannula was inserted into the coronary sinus via the right atrium. The left IMA was located as it entered the pericardium and was clamped with an atraumatic vascular bulldog. Aortic occlusion was performed with a single cross-clamp. Systemic cooling to 32 degrees Centigrade and topical cooling of the heart with iced saline were used. 650 cc of antegrade cold KBC cardioplegia was used to induce diastolic arrest and then [id number] cc of cold retrograde KBC cardioplegia was given. This was given at about 60 minute intervals throughout the period of arrest to maintain myocardial temperature at or below 10 degrees centigrade. A temperature probe was  inserted into the interventricular septum and an insulating pad was placed in the pericardium. Once the heart was stopped the remainder of the heart was mobilized by dividing pericardial adhesions.   Endoscopic vein harvest:  The right greater saphenous vein was harvested endoscopically through a 2 cm incision medial to the right  knee. It was harvested from the thigh. It was a medium-sized vein of good quality. The side branches were all ligated with 4-0 silk ties.    Coronary arteries:  The coronary arteries were examined.  LAD:  large vessel with a patent LIMA LCX:  Ramus was intramyocardial but was located and was a medium caliber vessel. RCA:  No stenosis on cath.    Grafts:  SVG to Ramus:  1.75 mm. It was sewn end to side using 7-0 prolene continuous suture.  The proximal vein graft anastomosis was performed to the mid-ascending aorta using continuous 6-0 prolene suture. A graft marker was placed around the proximal anastomosis.  Mitral Valve Replacement:    The left atrium was opened through a vertical incision in the interatrial groove. Exposure was good. Valve inspection showed thickened leaflets with vegetation and destruction along the free margins of the anterior and posterior leaflets. Some of this vegetation was down onto the chordae.  The anterior and posterior leaflets and subvalvular apparatus were excised due to the diffuse vegetation. This was all sent to micro for culture.  A series of pledgetted 2-0 Ethibond sutures were placed around the mitral annulus. A 27/29 mm On-X mechanical valve was chosen ( model  [place], [place] [id number]). The sutures were placed through the sewing ring and it was lowered into place. The sutures were tied using Corknots. The atrium was closed with 2 layers of continuous 3-0 prolene suture.   Completion:  The patient was rewarmed to 37 degrees Centigrade. A warm dose of KBC reanimation cardioplegia was given retrograde. Deairing maneuvers were performed and the head placed in trendelenburg position. The clamp was removed from the LIMA pedicle and there was rapid warming of the septum and return of ventricular fibrillation. The crossclamp was removed with a time of 142 minutes. There was spontaneous return of sinus rhythm. The distal and proximal anastomoses were checked for  hemostasis. The position of the grafts was satisfactory. Two temporary epicardial pacing wires were placed on the right atrium and two on the right ventricle. The patient was weaned from CPB without difficulty on milrinone 0.375, epi 3. CPB time was 185 minutes. Cardiac output was 5 LPM. TEE showed a normally functioning mechanical mitral valve with no paravalvular leak. LV systolic function was unchanged.  There was unchanged trivial central AI. The mobile plaque seen in the distal arch, proximal descending aorta was unchanged. Heparin was fully reversed with protamine and the aortic and venous cannulas removed. Hemostasis was achieved. Mediastinal and bilateral pleural drainage tubes were placed. The sternum was closed with double #6 stainless steel wires. The fascia was closed with continuous # 1 vicryl suture. The subcutaneous tissue was closed with 2-0 vicryl continuous suture. The skin was closed with 3-0 vicryl subcuticular suture. All sponge, needle, and instrument counts were reported correct at the end of the case. Dry sterile dressings were placed over the incisions and around the chest tubes which were connected to pleurevac suction. The patient was then transported to the surgical intensive care unit in stable condition.

## 2021-02-17 NOTE — Anesthesia Procedure Notes (Signed)
Central Venous Catheter Insertion Performed by: Cecile Hearing, MD, anesthesiologist Start/End12/03/2020 7:10 AM, 02/17/2021 7:15 AM Patient location: Pre-op. Preanesthetic checklist: patient identified, IV checked, site marked, risks and benefits discussed, surgical consent, monitors and equipment checked, pre-op evaluation and timeout performed Position: Trendelenburg Hand hygiene performed  and maximum sterile barriers used  Total catheter length 100. PA cath was placed.Swan type:thermodilution PA Cath depth:55 Procedure performed without using ultrasound guided technique. Attempts: 1 Patient tolerated the procedure well with no immediate complications.

## 2021-02-18 ENCOUNTER — Inpatient Hospital Stay (HOSPITAL_COMMUNITY): Payer: Self-pay

## 2021-02-18 ENCOUNTER — Encounter (HOSPITAL_COMMUNITY): Payer: Self-pay | Admitting: Surgery

## 2021-02-18 LAB — POCT I-STAT 7, (LYTES, BLD GAS, ICA,H+H)
Acid-Base Excess: 1 mmol/L (ref 0.0–2.0)
Acid-Base Excess: 1 mmol/L (ref 0.0–2.0)
Acid-Base Excess: 1 mmol/L (ref 0.0–2.0)
Bicarbonate: 25.4 mmol/L (ref 20.0–28.0)
Bicarbonate: 26.7 mmol/L (ref 20.0–28.0)
Bicarbonate: 27.7 mmol/L (ref 20.0–28.0)
Calcium, Ion: 1.2 mmol/L (ref 1.15–1.40)
Calcium, Ion: 1.22 mmol/L (ref 1.15–1.40)
Calcium, Ion: 1.22 mmol/L (ref 1.15–1.40)
HCT: 27 % — ABNORMAL LOW (ref 39.0–52.0)
HCT: 29 % — ABNORMAL LOW (ref 39.0–52.0)
HCT: 30 % — ABNORMAL LOW (ref 39.0–52.0)
Hemoglobin: 10.2 g/dL — ABNORMAL LOW (ref 13.0–17.0)
Hemoglobin: 9.2 g/dL — ABNORMAL LOW (ref 13.0–17.0)
Hemoglobin: 9.9 g/dL — ABNORMAL LOW (ref 13.0–17.0)
O2 Saturation: 100 %
O2 Saturation: 100 %
O2 Saturation: 99 %
Patient temperature: 37.2
Patient temperature: 37.4
Patient temperature: 37.6
Potassium: 5.5 mmol/L — ABNORMAL HIGH (ref 3.5–5.1)
Potassium: 5.6 mmol/L — ABNORMAL HIGH (ref 3.5–5.1)
Potassium: 5.8 mmol/L — ABNORMAL HIGH (ref 3.5–5.1)
Sodium: 136 mmol/L (ref 135–145)
Sodium: 136 mmol/L (ref 135–145)
Sodium: 137 mmol/L (ref 135–145)
TCO2: 27 mmol/L (ref 22–32)
TCO2: 28 mmol/L (ref 22–32)
TCO2: 29 mmol/L (ref 22–32)
pCO2 arterial: 40.4 mmHg (ref 32.0–48.0)
pCO2 arterial: 47.9 mmHg (ref 32.0–48.0)
pCO2 arterial: 58.3 mmHg — ABNORMAL HIGH (ref 32.0–48.0)
pH, Arterial: 7.288 — ABNORMAL LOW (ref 7.350–7.450)
pH, Arterial: 7.357 (ref 7.350–7.450)
pH, Arterial: 7.408 (ref 7.350–7.450)
pO2, Arterial: 175 mmHg — ABNORMAL HIGH (ref 83.0–108.0)
pO2, Arterial: 179 mmHg — ABNORMAL HIGH (ref 83.0–108.0)
pO2, Arterial: 196 mmHg — ABNORMAL HIGH (ref 83.0–108.0)

## 2021-02-18 LAB — CBC
HCT: 29.8 % — ABNORMAL LOW (ref 39.0–52.0)
HCT: 27.7 % — ABNORMAL LOW (ref 39.0–52.0)
Hemoglobin: 8.5 g/dL — ABNORMAL LOW (ref 13.0–17.0)
Hemoglobin: 8 g/dL — ABNORMAL LOW (ref 13.0–17.0)
MCH: 23 pg — ABNORMAL LOW (ref 26.0–34.0)
MCH: 23.7 pg — ABNORMAL LOW (ref 26.0–34.0)
MCHC: 28.5 g/dL — ABNORMAL LOW (ref 30.0–36.0)
MCHC: 28.9 g/dL — ABNORMAL LOW (ref 30.0–36.0)
MCV: 80.8 fL (ref 80.0–100.0)
MCV: 82 fL (ref 80.0–100.0)
Platelets: 173 10*3/uL (ref 150–400)
Platelets: 134 10*3/uL — ABNORMAL LOW (ref 150–400)
RBC: 3.69 MIL/uL — ABNORMAL LOW (ref 4.22–5.81)
RBC: 3.38 MIL/uL — ABNORMAL LOW (ref 4.22–5.81)
RDW: 30.7 % — ABNORMAL HIGH (ref 11.5–15.5)
RDW: 30.7 % — ABNORMAL HIGH (ref 11.5–15.5)
WBC: 22.3 10*3/uL — ABNORMAL HIGH (ref 4.0–10.5)
WBC: 17.1 10*3/uL — ABNORMAL HIGH (ref 4.0–10.5)
nRBC: 0 % (ref 0.0–0.2)
nRBC: 0 % (ref 0.0–0.2)

## 2021-02-18 LAB — GLUCOSE, CAPILLARY
Glucose-Capillary: 100 mg/dL — ABNORMAL HIGH (ref 70–99)
Glucose-Capillary: 112 mg/dL — ABNORMAL HIGH (ref 70–99)
Glucose-Capillary: 115 mg/dL — ABNORMAL HIGH (ref 70–99)
Glucose-Capillary: 116 mg/dL — ABNORMAL HIGH (ref 70–99)
Glucose-Capillary: 120 mg/dL — ABNORMAL HIGH (ref 70–99)
Glucose-Capillary: 120 mg/dL — ABNORMAL HIGH (ref 70–99)
Glucose-Capillary: 120 mg/dL — ABNORMAL HIGH (ref 70–99)
Glucose-Capillary: 126 mg/dL — ABNORMAL HIGH (ref 70–99)
Glucose-Capillary: 130 mg/dL — ABNORMAL HIGH (ref 70–99)
Glucose-Capillary: 130 mg/dL — ABNORMAL HIGH (ref 70–99)
Glucose-Capillary: 133 mg/dL — ABNORMAL HIGH (ref 70–99)
Glucose-Capillary: 133 mg/dL — ABNORMAL HIGH (ref 70–99)
Glucose-Capillary: 94 mg/dL (ref 70–99)

## 2021-02-18 LAB — PROTIME-INR
INR: 1.4 — ABNORMAL HIGH (ref 0.8–1.2)
Prothrombin Time: 17 seconds — ABNORMAL HIGH (ref 11.4–15.2)

## 2021-02-18 LAB — BASIC METABOLIC PANEL
Anion gap: 8 (ref 5–15)
Anion gap: 8 (ref 5–15)
BUN: 24 mg/dL — ABNORMAL HIGH (ref 6–20)
BUN: 21 mg/dL — ABNORMAL HIGH (ref 6–20)
CO2: 24 mmol/L (ref 22–32)
CO2: 25 mmol/L (ref 22–32)
Calcium: 8.5 mg/dL — ABNORMAL LOW (ref 8.9–10.3)
Calcium: 8.8 mg/dL — ABNORMAL LOW (ref 8.9–10.3)
Chloride: 102 mmol/L (ref 98–111)
Chloride: 103 mmol/L (ref 98–111)
Creatinine, Ser: 1.27 mg/dL — ABNORMAL HIGH (ref 0.61–1.24)
Creatinine, Ser: 1.32 mg/dL — ABNORMAL HIGH (ref 0.61–1.24)
GFR, Estimated: >60 mL/min (ref >60)
GFR, Estimated: >60 mL/min (ref >60)
Glucose, Bld: 127 mg/dL — ABNORMAL HIGH (ref 70–99)
Glucose, Bld: 111 mg/dL — ABNORMAL HIGH (ref 70–99)
Potassium: 5.7 mmol/L — ABNORMAL HIGH (ref 3.5–5.1)
Potassium: 5.5 mmol/L — ABNORMAL HIGH (ref 3.5–5.1)
Sodium: 134 mmol/L — ABNORMAL LOW (ref 135–145)
Sodium: 136 mmol/L (ref 135–145)

## 2021-02-18 LAB — ECHO INTRAOPERATIVE TEE
Height: 71 in
S' Lateral: 5.84 cm
Weight: 2475.2 oz

## 2021-02-18 LAB — ACID FAST SMEAR (AFB, MYCOBACTERIA): Acid Fast Smear: NEGATIVE

## 2021-02-18 LAB — POTASSIUM
Potassium: 5.6 mmol/L — ABNORMAL HIGH (ref 3.5–5.1)
Potassium: 5.6 mmol/L — ABNORMAL HIGH (ref 3.5–5.1)
Potassium: 5.1 mmol/L (ref 3.5–5.1)
Potassium: 4.3 mmol/L (ref 3.5–5.1)

## 2021-02-18 LAB — COOXEMETRY PANEL
Carboxyhemoglobin: 1.8 % — ABNORMAL HIGH (ref 0.5–1.5)
Methemoglobin: 0.8 % (ref 0.0–1.5)
O2 Saturation: 60.7 %
Total hemoglobin: 7.7 g/dL — ABNORMAL LOW (ref 12.0–16.0)

## 2021-02-18 LAB — MAGNESIUM
Magnesium: 3 mg/dL — ABNORMAL HIGH (ref 1.7–2.4)
Magnesium: 2.1 mg/dL (ref 1.7–2.4)

## 2021-02-18 MED ORDER — ENOXAPARIN SODIUM 40 MG/0.4ML IJ SOSY
40.0000 mg | PREFILLED_SYRINGE | Freq: Every day | INTRAMUSCULAR | Status: DC
  Administered 2021-02-18 – 2021-02-20 (×3): 40 mg via SUBCUTANEOUS
  Filled 2021-02-18 (×3): qty 0.4

## 2021-02-18 MED ORDER — SODIUM ZIRCONIUM CYCLOSILICATE 5 G PO PACK
5.0000 g | PACK | Freq: Once | ORAL | Status: DC
  Filled 2021-02-18: qty 1

## 2021-02-18 MED ORDER — ENSURE ENLIVE PO LIQD
237.0000 mL | Freq: Three times a day (TID) | ORAL | Status: DC
Start: 2021-02-18 — End: 2021-03-02
  Administered 2021-02-18 – 2021-03-01 (×31): 237 mL via ORAL

## 2021-02-18 MED ORDER — ROSUVASTATIN CALCIUM 20 MG PO TABS
40.0000 mg | ORAL_TABLET | Freq: Every day | ORAL | Status: DC
  Administered 2021-02-18: 40 mg
  Filled 2021-02-18: qty 2

## 2021-02-18 MED ORDER — INSULIN ASPART 100 UNIT/ML IJ SOLN
0.0000 [IU] | Freq: Three times a day (TID) | INTRAMUSCULAR | Status: DC
  Administered 2021-02-20: 08:00:00 4 [IU] via SUBCUTANEOUS

## 2021-02-18 MED ORDER — HYDRALAZINE HCL 20 MG/ML IJ SOLN
10.0000 mg | INTRAMUSCULAR | Status: DC | PRN
  Administered 2021-02-18: 21:00:00 10 mg via INTRAVENOUS
  Filled 2021-02-18: qty 1

## 2021-02-18 MED ORDER — FUROSEMIDE 10 MG/ML IJ SOLN
40.0000 mg | Freq: Once | INTRAMUSCULAR | Status: AC
  Administered 2021-02-18: 40 mg via INTRAVENOUS
  Filled 2021-02-18: qty 4

## 2021-02-18 MED ORDER — SODIUM ZIRCONIUM CYCLOSILICATE 5 G PO PACK
5.0000 g | PACK | Freq: Once | ORAL | Status: AC
  Administered 2021-02-18: 5 g
  Filled 2021-02-18: qty 1

## 2021-02-18 MED ORDER — OXYCODONE HCL 5 MG PO TABS
5.0000 mg | ORAL_TABLET | ORAL | Status: DC | PRN
Start: 2021-02-18 — End: 2021-02-23
  Administered 2021-02-19: 5 mg
  Filled 2021-02-18: qty 1

## 2021-02-18 MED ORDER — DOCUSATE SODIUM 100 MG PO CAPS
200.0000 mg | ORAL_CAPSULE | Freq: Every day | ORAL | Status: DC
Start: 2021-02-19 — End: 2021-02-23
  Administered 2021-02-19 – 2021-02-22 (×3): 200 mg via ORAL
  Filled 2021-02-18 (×3): qty 2

## 2021-02-18 MED ORDER — WARFARIN SODIUM 2.5 MG PO TABS
2.5000 mg | ORAL_TABLET | Freq: Once | ORAL | Status: AC
  Administered 2021-02-18: 2.5 mg
  Filled 2021-02-18: qty 1

## 2021-02-18 MED ORDER — ROSUVASTATIN CALCIUM 20 MG PO TABS
40.0000 mg | ORAL_TABLET | Freq: Every day | ORAL | Status: DC
  Administered 2021-02-19 – 2021-03-01 (×11): 40 mg via ORAL
  Filled 2021-02-18 (×11): qty 2

## 2021-02-18 MED ORDER — TRAMADOL HCL 50 MG PO TABS
50.0000 mg | ORAL_TABLET | ORAL | Status: DC | PRN
Start: 2021-02-18 — End: 2021-02-22

## 2021-02-18 MED ORDER — SODIUM ZIRCONIUM CYCLOSILICATE 10 G PO PACK
10.0000 g | PACK | Freq: Once | ORAL | Status: AC
  Administered 2021-02-18: 10 g
  Filled 2021-02-18: qty 1

## 2021-02-18 MED ORDER — DOCUSATE SODIUM 50 MG/5ML PO LIQD
200.0000 mg | Freq: Every day | ORAL | Status: DC
Start: 2021-02-18 — End: 2021-02-18

## 2021-02-18 MED ORDER — WARFARIN - PHYSICIAN DOSING INPATIENT: Freq: Every day | Status: DC

## 2021-02-18 MED FILL — Thrombin (Recombinant) For Soln 20000 Unit: CUTANEOUS | Qty: 1 | Status: AC

## 2021-02-18 NOTE — Progress Notes (Signed)
Patient ID: Mark Garrett, male   DOB: 12/06/1980, 40 y.o.   MRN: [id number]       Advanced Heart Failure Rounding Note  PCP-Cardiologist: None   Subjective:    12/1: Mechanical On-X mitral valve, CABG x 1 with SVG-ramus.  Extensive vegetation noted on excised valve.   This morning, he is hypertensive on milrinone 0.375, epinephrine 3, NE 3, phenylephrine 60. Creatinine stable 1.27.   Hyperkalemic and acidotic, vent settings changed and HCO3 given.  ABG improved this morning, K remains 5.7.   Tm 100.2, WBCs down to 22.3.  He remains on daptomycin/ceftriaxone, no growth from valve culture so far.   Swan:  RA 11-12 PA 48/13 CI 2.8 Co-ox 61%  TEE (11/16): EF 35-40%, there was mobile vegetation on both the anterior and posterior mitral leaflets.  The leaflets did not completely coapt (?destruction from endocarditis) with severe mitral regurgitation (ERO 0.56 cm^2).  There was extensive plaque in the proximal descending thoracic aorta with what appears to be mobile vegetation attached to it.   LHC/RHC:   Coronary Findings  Diagnostic Dominance: Right Left Anterior Descending  Mid LAD lesion is 95% stenosed.  Ramus Intermedius  Ramus-1 lesion is 50% stenosed.  Ramus-2 lesion is 80% stenosed.  Right Coronary Artery  Mid RCA lesion is 30% stenosed.  Graft To PLV  Origin lesion is 100% stenosed.  LIMA Graft To Dist LAD  Intervention  No interventions have been documented. Right Heart  Right Heart Pressures RHC Procedural Findings: Hemodynamics (mmHg) on milrinone 0.375 RA mean 18 RV 69/20 PA 76/25, mean 44 PCWP mean 22, v-waves to 37 mmHg LV 100/23 AO 100/60  Oxygen saturations: PA 58% AO 97%  Cardiac Output (Fick) 5.33  Cardiac Index (Fick) 2.67 PVR 4.1 WU  Cardiac Output (Thermo) 4.89 Cardiac Index (Thermo) 2.45  PVR 4.5 WU  PAPI: 2.8    Objective:   Weight Range: 75.6 kg Body mass index is 23.25 kg/m.   Vital Signs:   Temp:  [99 F (37.2  C)-100.2 F (37.9 C)] 99.5 F (37.5 C) (12/02 0605) Pulse Rate:  [57-81] 65 (12/02 0605) Resp:  [12-26] 26 (12/02 0605) BP: (65-143)/(55-77) 105/62 (12/02 0600) SpO2:  [60 %-100 %] 100 % (12/02 0605) Arterial Line BP: (71-162)/(42-68) 106/51 (12/02 0605) FiO2 (%):  [40 %-50 %] 40 % (12/02 0330) Weight:  [75.6 kg] 75.6 kg (12/02 0500) Last BM Date: 02/15/21  Weight change: Filed Weights   02/16/21 0504 02/17/21 0608 02/18/21 0500  Weight: 69.9 kg 70.2 kg 75.6 kg    Intake/Output:   Intake/Output Summary (Last 24 hours) at 02/18/2021 0751 Last data filed at 02/18/2021 0700 Gross per 24 hour  Intake 6737.22 ml  Output 2396 ml  Net 4341.22 ml      Physical Exam   General: NAD, awake on vent Neck: JVP 10 cm, no thyromegaly or thyroid nodule.  Lungs: Occasional rhonchi CV: Nondisplaced PMI.  Heart regular S1/S2 with mechanical S1, no S3/S4, no murmur.  No peripheral edema.   Abdomen: Soft, nontender, no hepatosplenomegaly, no distention.  Skin: Intact without lesions or rashes.  Neurologic: Alert on vent, follows commands.  Extremities: No clubbing or cyanosis. S/p traumatic left foot amputation HEENT: Normal.    Telemetry   SR 70s (personally reviewed)   Labs    CBC Recent Labs    02/17/21 2028 02/18/21 0015 02/18/21 0309 02/18/21 0312  WBC 28.4*  --  22.3*  --   HGB 9.4*   < > 8.5* 9.9*  HCT 32.5*   < > 29.8* 29.0*  MCV 80.4  --  80.8  --   PLT 187  --  173  --    < > = values in this interval not displayed.   Basic Metabolic Panel Recent Labs    02/17/21 2028 02/17/21 2233 02/18/21 0158 02/18/21 0312 02/18/21 0518  NA 133*   < > 134* 136  --   K 6.7*   < > 5.7* 5.5* 5.6*  CL 101  --  102  --   --   CO2 24  --  24  --   --   GLUCOSE 120*  --  127*  --   --   BUN 26*  --  24*  --   --   CREATININE 1.23  --  1.27*  --   --   CALCIUM 8.5*  --  8.5*  --   --   MG 3.7*  --  3.0*  --   --    < > = values in this interval not displayed.   Liver  Function Tests No results for input(s): AST, ALT, ALKPHOS, BILITOT, PROT, ALBUMIN in the last 72 hours.  No results for input(s): LIPASE, AMYLASE in the last 72 hours. Cardiac Enzymes No results for input(s): CKTOTAL, CKMB, CKMBINDEX, TROPONINI in the last 72 hours.   BNP: BNP (last 3 results) Recent Labs    01/27/21 1034  BNP 1,733.0*    ProBNP (last 3 results) No results for input(s): PROBNP in the last 8760 hours.   D-Dimer No results for input(s): DDIMER in the last 72 hours. Hemoglobin A1C Recent Labs    02/16/21 2033  HGBA1C 4.7*   Fasting Lipid Panel No results for input(s): CHOL, HDL, LDLCALC, TRIG, CHOLHDL, LDLDIRECT in the last 72 hours.  Thyroid Function Tests No results for input(s): TSH, T4TOTAL, T3FREE, THYROIDAB in the last 72 hours.  Invalid input(s): FREET3  Other results:   Imaging    DG Chest Port 1 View  Result Date: 02/17/2021 CLINICAL DATA:  Status post mitral valve replacement. EXAM: PORTABLE CHEST 1 VIEW COMPARISON:  One day prior FINDINGS: 2 AP portable radiographs with exclusion of the right costophrenic angle. Repeat median sternotomy. Endotracheal tube terminates 6.9 cm above carina. Nasogastric tube terminates at the body of the stomach with the side port just below the gastroesophageal junction. Left internal jugular line tip likely at the central left brachiocephalic vein. Mediastinal drain. Right internal jugular Swan with tip at pulmonary outflow tract. Three left and 1 right-sided chest tube. Midline trachea. Moderate cardiomegaly. No left and no large right pleural effusion. No pneumothorax. No congestive failure. Lateral right lung airspace disease is similar. IMPRESSION: Support apparatus as detailed above. No pneumothorax or other acute complication. Similar lateral right lower lobe airspace disease. Electronically Signed   By: Abigail Miyamoto M.D.   On: 02/17/2021 15:25     Medications:     Scheduled Medications:  acetaminophen   1,000 mg Oral Q6H   Or   acetaminophen (TYLENOL) oral liquid 160 mg/5 mL  1,000 mg Per Tube Q6H   albuterol  2.5 mg Nebulization Q6H   aspirin EC  325 mg Oral Daily   Or   aspirin  324 mg Per Tube Daily   bisacodyl  10 mg Oral Daily   Or   bisacodyl  10 mg Rectal Daily   chlorhexidine gluconate (MEDLINE KIT)  15 mL Mouth Rinse BID   Chlorhexidine Gluconate Cloth  6 each  Topical Daily   docusate sodium  200 mg Oral Daily   mouth rinse  15 mL Mouth Rinse 10 times per day   metoCLOPramide (REGLAN) injection  10 mg Intravenous Q6H   [START ON 02/19/2021] pantoprazole  40 mg Oral Daily   rosuvastatin  40 mg Oral Daily   sodium chloride flush  3 mL Intravenous Q12H    Infusions:  sodium chloride 20 mL/hr at 02/18/21 0700   sodium chloride     sodium chloride 10 mL/hr at 02/17/21 1435   albumin human 12.5 g (02/17/21 1805)   cefTRIAXone (ROCEPHIN)  IV Stopped (02/16/21 1951)   DAPTOmycin (CUBICIN)  IV Stopped (02/17/21 2147)   dexmedetomidine (PRECEDEX) IV infusion 0.1 mcg/kg/hr (02/18/21 0700)   epinephrine 3 mcg/min (02/18/21 0700)   insulin 0.4 Units/hr (02/18/21 0700)   lactated ringers     lactated ringers 20 mL/hr at 02/17/21 1435   lactated ringers 20 mL/hr at 02/18/21 0700   milrinone 0.375 mcg/kg/min (02/18/21 0700)   nitroGLYCERIN Stopped (02/17/21 1612)   norepinephrine (LEVOPHED) Adult infusion 3 mcg/min (02/18/21 0700)   phenylephrine (NEO-SYNEPHRINE) Adult infusion 60 mcg/min (02/18/21 0700)    PRN Medications: sodium chloride, albumin human, dextrose, lactated ringers, midazolam, morphine injection, ondansetron (ZOFRAN) IV, oxyCODONE, sodium chloride flush, traMADol    Assessment/Plan   1. Acute systolic CHF: Ischemic cardiomyopathy, symptoms worsened by severe MR.  Echo with EF 35-40%, severe hypokinesis inferior and inferolateral walls, moderate LV dilation with mild LVH, mild RV dilation with moderately decreased systolic function, restricted posterior mitral  leaflet and calcified mitral valve with severe mitral regurgitation and at least mild mitral stenosis (mean gradient 8 mmHg), PASP 60. Per report, echo at [facility] showed EF 55-60% with severe MR in 10/22.  It was a technically difficult study per report.  It is possible that he had a new ACS event prior to admission with reported drop in EF though HS-TnI with mild elevation and no trend suggests that it was not immediately prior to admission.  Low output noted with co-ox 39% initially, lactate elevated when he was initially admitted.  Milrinone begun with stabilization.  Now s/p mechanical MVR and SVG-ramus.  Stable cardiac index 2.8 with co-ox 61%, MAP elevated.  On milrinone 0.375, epinephrine 3, phenylephrine 60, NE 3. CVP 11-12 with hyperkalemia but stable creatinine.  - Need to wean off phenylephrine today (can increase NE if needed but should be able to wean off), wean NE after that.  - Lasix 40 mg IV x 1 with a dose of Lokelma 5 g as well.  - Follow Swan indices.  2. CAD: S/p CABG 2006.  As above, with fall in EF and CHF exacerbation, possible ACS prior to admission.  However, mild HS-TnI elevation with no trend suggests that ACS was not immediately prior to admission.  Current elevation is likely demand ischemia from volume overload. LHC on 11/14 showed patent LIMA-LAD with SVG occluded at aorta (does not look new); there was complex 80% proximal ramus stenosis.  Initially assumed that the SVG was to the ramus, but subsequently got the CABG operative report from [place] and it looks like it was an SVG-PLV.  There was minimal native RCA disease. LAD territory well-supplied by LIMA. Now s/p SVG-ramus on 12/1.  - ASA  - Crestor 40 3.  Mitral valve disease: Initial echo showed posterior MV leaflet restricted with severe MR, suspected primarily infarct-related MR given LV dilation and inferior/inferolateral severe hypokinesis.  However, TEE on 11/16 showed vegetation (not  bulky but clearly present) on  the posterior and anterior leaflets with poor leaflet coaptation, suggesting endocarditis may be the major cause of severe MR.  Reviewed with ID, suspect the mitral vegetations have been present for a while (severe MR on 10/22 echo as well, does not appear to have active infection).  Cannot rule out nonbacterial thrombotic endocarditis (had recent DVT also), but somewhat academic at this point as he will be on anticoagulation for the non-triggered DVT and we are going to treat with antibiotic course given presentation and high WBCs. ANA negative. Extensive vegetation noted on MV at time of MVR, sent for cultures.  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   - Anticoagulation start to be determined by TCTS.  4. 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  ?due to sedentary lifestyle + ?genetic predisposition.  He is not a Xarelto failure, had DVT found in 10/22 with Xarelto started at this point, 11/22 dopplers showed partially treated DVTs.  - He will be anticoagulated with mechanical valve.  5. Smoking: Needs to quit.  6. PNA: Cultures 11/18 NGTD.  RML PNA on CT chest.  - He completed course of abx initially for PNA.  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 He is able to walk on the stump.  - PT following, continue to mobilize => walk daily in hall.  8. 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  There is also mobile vegetation that appears adherent to plaque in the proximal descending thoracic aorta.  CT head showed no evidence for embolic disease.  Possible source would be due to poor dentition. Blood cultures from 11/10 and 11/16 NGTD.  He was initially on abx for PNA.  We re-drew cultures on 11/16 and 11/24, all negative so far.  ESR is 1, CRP 1.7.  He had been already partially treated when we found the endocarditis (abx for PNA) and suspect that the endocarditis is old as he had severe MR also on echo in 10/22. ID following.  WBCs rose to  >30 after teeth removed but now coming back down (13 today ).  Suspect worsening leukocytosis was a response to dental surgery. Extensive vegetation noted on MV at time of surgery, sample sent for culture.  Tm 100.2, WBCs trending down (22 today).  - Per ID, continue 6 wks of IV abx to 12/27 =>ceftriaxone and daptomycin.    9. Microcytic anemia: Post-op drop, hgb 8.5.  - Iron stores low, had Feraheme  - Transfuse hgb < 8.  10. Acute hypoxemic respiratory failure: Remains intubated post-op. Awake/alert on vent.  - Hopefully extubate this morning.  11. AKI: Creatinine mildly higher from pre-op to 1.27 today but K has been persistently high. Still 5.7 this morning.  - Will give Lasix 40 mg IV x 1 this morning.  - Will give Lokelma 5 g this morning.    CRITICAL CARE Performed by: Loralie Champagne  Total critical care time: 40 minutes  Critical care time was exclusive of separately billable procedures and treating other patients.  Critical care was necessary to treat or prevent imminent or life-threatening deterioration.  Critical care was time spent personally by me on the following activities: development of treatment plan with patient and/or surrogate as well as nursing, discussions with consultants, evaluation of patient's response to treatment, examination of patient, obtaining history from patient or surrogate, ordering and performing treatments and interventions, ordering and review of laboratory studies, ordering and review of radiographic  studies, pulse oximetry and re-evaluation of patient's condition.   Length of Stay: Phillips, MD  02/18/2021, 7:51 AM  Advanced Heart Failure Team Pager [phone] (M-F; 7a - 5p)  Please contact [facility] for night-coverage after hours (5p -7a ) and weekends on [email or web address]

## 2021-02-18 NOTE — TOC Progression Note (Signed)
Transition of Care [facility]) - Progression Note    Patient Details  Name: Mark Garrett MRN: [id number] Date of Birth: 1980/09/12  [facility] CM/SW Contact  Omaria Plunk, LCSWA Phone Number: 02/18/2021, 1:58 PM  Clinical Narrative:    HF CSW will follow up with Mr. Lightsey once he is no longer intubated.  CSW will continue to follow throughout discharge.   Expected Discharge Plan: Home/Self Care Barriers to Discharge: Continued Medical Work up  Expected Discharge Plan and Services Expected Discharge Plan: Home/Self Care In-house Referral: Clinical Social Work Discharge Planning Services: CM Consult, Indigent Health Clinic, MATCH Program, Medication Assistance, Follow-up appt scheduled   Living arrangements for the past 2 months: Single Family Home                   DME Agency: NA       HH Arranged: NA           Social Determinants of Health (SDOH) Interventions Food Insecurity Interventions: Other (Comment) (Patient has food stamps $250 a month) Financial Strain Interventions: Intervention Not Indicated, Artist (referral to [employer] to screen for Medicaid, patient agreeable) Housing Interventions: Intervention Not Indicated Transportation Interventions: Retail banker  Readmission Risk Interventions No flowsheet data found.  Blakelyn Dinges, MSW, LCSWA [phone] Heart Failure Social Worker

## 2021-02-18 NOTE — Progress Notes (Addendum)
TCTS DAILY ICU PROGRESS NOTE                   [id number]            [place] [address]          [phone]   1 Day Post-Op Procedure(s) (LRB): REDO STERNOTOMY (N/A) MITRAL VALVE (MV) REPLACEMENT USING ON-X 27/29 MM PROSTHETIC MITRAL VALVE (N/A) CORONARY ARTERY BYPASS GRAFTING (CABG), ON PUMP, TIMES ONE, USING ENDOSCOPICALLY HARVESTED RIGHT GREATER SAPHENOUS VEIN (N/A) TRANSESOPHAGEAL ECHOCARDIOGRAM (TEE) (N/A) APPLICATION OF CELL SAVER  Total Length of Stay:  LOS: 22 days   Subjective:  Awake, comfortable on vent.  He denies pain other than when he coughs.    Objective: Vital signs in last 24 hours: Temp:  [99 F (37.2 C)-100.2 F (37.9 C)] 99.5 F (37.5 C) (12/02 0605) Pulse Rate:  [57-81] 65 (12/02 0749) Cardiac Rhythm: Bundle branch block (12/01 1600) Resp:  [12-26] 26 (12/02 0749) BP: (65-143)/(55-77) 138/69 (12/02 0749) SpO2:  [60 %-100 %] 100 % (12/02 0749) Arterial Line BP: (71-162)/(42-68) 106/51 (12/02 0605) FiO2 (%):  [40 %-50 %] 40 % (12/02 0749) Weight:  [75.6 kg] 75.6 kg (12/02 0500)  Filed Weights   02/16/21 0504 02/17/21 0608 02/18/21 0500  Weight: 69.9 kg 70.2 kg 75.6 kg    Weight change: 5.428 kg   Hemodynamic parameters for last 24 hours: PAP: (30-75)/(4-25) 48/13 CO:  [4 L/min-5.3 L/min] 5.3 L/min CI:  [2.1 L/min/m2-2.8 L/min/m2] 2.8 L/min/m2  Intake/Output from previous day: 12/01 0701 - 12/02 0700 In: 6737.2 [I.V.:5055.8; Blood:600; IV Piggyback:1081.4] Out: 2396 [Urine:2065; Chest Tube:331]  Current Meds: Scheduled Meds:  acetaminophen  1,000 mg Oral Q6H   Or   acetaminophen (TYLENOL) oral liquid 160 mg/5 mL  1,000 mg Per Tube Q6H   albuterol  2.5 mg Nebulization Q6H   aspirin EC  325 mg Oral Daily   Or   aspirin  324 mg Per Tube Daily   bisacodyl  10 mg Oral Daily   Or   bisacodyl  10 mg Rectal Daily   chlorhexidine gluconate (MEDLINE KIT)  15 mL Mouth Rinse BID   Chlorhexidine Gluconate Cloth  6 each  Topical Daily   docusate sodium  200 mg Oral Daily   furosemide  40 mg Intravenous Once   mouth rinse  15 mL Mouth Rinse 10 times per day   metoCLOPramide (REGLAN) injection  10 mg Intravenous Q6H   [START ON 02/19/2021] pantoprazole  40 mg Oral Daily   rosuvastatin  40 mg Oral Daily   sodium chloride flush  3 mL Intravenous Q12H   sodium zirconium cyclosilicate  5 g Oral Once   Continuous Infusions:  sodium chloride 20 mL/hr at 02/18/21 0700   sodium chloride     sodium chloride 10 mL/hr at 02/17/21 1435   albumin human 12.5 g (02/17/21 1805)   cefTRIAXone (ROCEPHIN)  IV Stopped (02/16/21 1951)   DAPTOmycin (CUBICIN)  IV Stopped (02/17/21 2147)   dexmedetomidine (PRECEDEX) IV infusion 0.1 mcg/kg/hr (02/18/21 0700)   epinephrine 3 mcg/min (02/18/21 0700)   insulin 0.4 Units/hr (02/18/21 0700)   lactated ringers     lactated ringers 20 mL/hr at 02/17/21 1435   lactated ringers 20 mL/hr at 02/18/21 0700   milrinone 0.375 mcg/kg/min (02/18/21 0700)   nitroGLYCERIN Stopped (02/17/21 1612)   norepinephrine (LEVOPHED) Adult infusion 3 mcg/min (02/18/21 0700)   phenylephrine (NEO-SYNEPHRINE) Adult infusion 60 mcg/min (02/18/21 0700)   PRN Meds:.sodium chloride, albumin  human, dextrose, lactated ringers, midazolam, morphine injection, ondansetron (ZOFRAN) IV, oxyCODONE, sodium chloride flush, traMADol  General appearance: alert and no distress Neurologic: intact Heart: regular rate and rhythm Lungs: clear to auscultation bilaterally and ventilatory respirations Abdomen: soft, non-tender; bowel sounds normal; no masses,  no organomegaly Extremities: edema trace Wound: clean and dry  Lab Results: CBC: Recent Labs    02/17/21 2028 02/18/21 0015 02/18/21 0309 02/18/21 0312  WBC 28.4*  --  22.3*  --   HGB 9.4*   < > 8.5* 9.9*  HCT 32.5*   < > 29.8* 29.0*  PLT 187  --  173  --    < > = values in this interval not displayed.   BMET:  Recent Labs    02/17/21 2028 02/17/21 2233  02/18/21 0158 02/18/21 0312 02/18/21 0518  NA 133*   < > 134* 136  --   K 6.7*   < > 5.7* 5.5* 5.6*  CL 101  --  102  --   --   CO2 24  --  24  --   --   GLUCOSE 120*  --  127*  --   --   BUN 26*  --  24*  --   --   CREATININE 1.23  --  1.27*  --   --   CALCIUM 8.5*  --  8.5*  --   --    < > = values in this interval not displayed.    CMET: Lab Results  Component Value Date   WBC 22.3 (H) 02/18/2021   HGB 9.9 (L) 02/18/2021   HCT 29.0 (L) 02/18/2021   PLT 173 02/18/2021   GLUCOSE 127 (H) 02/18/2021   CHOL 97 01/30/2021   TRIG 58 01/30/2021   HDL 11 (L) 01/30/2021   LDLCALC 74 01/30/2021   ALT 29 02/01/2021   AST 27 02/01/2021   NA 136 02/18/2021   K 5.6 (H) 02/18/2021   CL 102 02/18/2021   CREATININE 1.27 (H) 02/18/2021   BUN 24 (H) 02/18/2021   CO2 24 02/18/2021   INR 1.3 (H) 02/17/2021   HGBA1C 4.7 (L) 02/16/2021      PT/INR:  Recent Labs    02/17/21 1430  LABPROT 16.4*  INR 1.3*   Radiology: [facility] Chest Port 1 View  Result Date: 02/17/2021 CLINICAL DATA:  Status post mitral valve replacement. EXAM: PORTABLE CHEST 1 VIEW COMPARISON:  One day prior FINDINGS: 2 AP portable radiographs with exclusion of the right costophrenic angle. Repeat median sternotomy. Endotracheal tube terminates 6.9 cm above carina. Nasogastric tube terminates at the body of the stomach with the side port just below the gastroesophageal junction. Left internal jugular line tip likely at the central left brachiocephalic vein. Mediastinal drain. Right internal jugular Swan with tip at pulmonary outflow tract. Three left and 1 right-sided chest tube. Midline trachea. Moderate cardiomegaly. No left and no large right pleural effusion. No pneumothorax. No congestive failure. Lateral right lung airspace disease is similar. IMPRESSION: Support apparatus as detailed above. No pneumothorax or other acute complication. Similar lateral right lower lobe airspace disease. Electronically Signed   By: Abigail Miyamoto M.D.   On: 02/17/2021 15:25     Assessment/Plan: S/P Procedure(s) (LRB): REDO STERNOTOMY (N/A) MITRAL VALVE (MV) REPLACEMENT USING ON-X 27/29 MM PROSTHETIC MITRAL VALVE (N/A) CORONARY ARTERY BYPASS GRAFTING (CABG), ON PUMP, TIMES ONE, USING ENDOSCOPICALLY HARVESTED RIGHT GREATER SAPHENOUS VEIN (N/A) TRANSESOPHAGEAL ECHOCARDIOGRAM (TEE) (N/A) APPLICATION OF CELL SAVER  CV- Post op EKG NSR  with RBBB- currently on Epi _0 , Milrinone at 0.375, Levo at 3, Neo at 60--- AHF following, wean drips as hemodynamics indicate Pulm- remains intubated, responsive.. wean to extubate once able.. MT chest tube with 80 cc output, PT with 220 cc output Renal- creatinine okay, he is hyperkalemic at 5.6.. Lokelma ordered by AHF, diuretics to also be initiated Expected post operative blood loss anemia, mild Hgb at 9.9 CBGs controlled, on insulin drip at 0.4 units/hr.. can transition to long acting, SSIP once extubated Dispo- patient alert but intubated, multiple inotropic drips per AHF, will need low dose coumadin for his mechanical valve,  Hyperkalemic being treated with Lokelma, diuretics to be started  Ellwood Handler, PA-C 02/18/2021 7:53 AM

## 2021-02-18 NOTE — Anesthesia Postprocedure Evaluation (Signed)
Anesthesia Post Note  Patient: Mark Garrett [facility]  Procedure(s) Performed: REDO STERNOTOMY MITRAL VALVE (MV) REPLACEMENT USING ON-X 27/29 MM PROSTHETIC MITRAL VALVE (Chest) CORONARY ARTERY BYPASS GRAFTING (CABG), ON PUMP, TIMES ONE, USING ENDOSCOPICALLY HARVESTED RIGHT GREATER SAPHENOUS VEIN (Chest) TRANSESOPHAGEAL ECHOCARDIOGRAM (TEE) APPLICATION OF CELL SAVER     Patient location during evaluation: SICU Anesthesia Type: General Level of consciousness: sedated Pain management: pain level controlled Vital Signs Assessment: post-procedure vital signs reviewed and stable Respiratory status: patient remains intubated per anesthesia plan Cardiovascular status: unstable (requiring multiple vasoactive infusions for BP support) Postop Assessment: no apparent nausea or vomiting Anesthetic complications: no   No notable events documented.  Last Vitals:  Vitals:   02/18/21 1445 02/18/21 1500  BP:    Pulse: 63 64  Resp: (!) 27 16  Temp: 36.9 C 36.8 C  SpO2: 100% 100%    Last Pain:  Vitals:   02/18/21 1200  TempSrc: Core  PainSc: 3                  Cecile Hearing

## 2021-02-18 NOTE — Progress Notes (Signed)
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 (N/A) CORONARY ARTERY BYPASS GRAFTING (CABG), ON PUMP, TIMES ONE, USING ENDOSCOPICALLY HARVESTED RIGHT GREATER SAPHENOUS VEIN (N/A) TRANSESOPHAGEAL ECHOCARDIOGRAM (TEE) (N/A) APPLICATION OF CELL SAVER   Total Length of Stay:  LOS: 22 days  Events:   No events Resting comfortably    BP (!) 145/60   Pulse 64   Temp 98.2 F (36.8 C)   Resp 16   Ht 5\' 11"  (1.803 m)   Wt 75.6 kg   SpO2 100%   BMI 23.25 kg/m   PAP: (44-81)/(9-31) 76/16 CVP:  [4 mmHg-11 mmHg] 4 mmHg CO:  [4.4 L/min-5.3 L/min] 5.3 L/min CI:  [2.3 L/min/m2-2.8 L/min/m2] 2.8 L/min/m2  Vent Mode: PSV;CPAP FiO2 (%):  [40 %-50 %] 40 % Set Rate:  [4 bmp-26 bmp] 4 bmp Vt Set:  [600 mL-700 mL] 700 mL PEEP:  [5 cmH20] 5 cmH20 Pressure Support:  [10 cmH20] 10 cmH20 Plateau Pressure:  [17 cmH20-21 cmH20] 21 cmH20   sodium chloride 20 mL/hr at 02/18/21 1700   sodium chloride     sodium chloride 10 mL/hr at 02/17/21 1435   cefTRIAXone (ROCEPHIN)  IV Stopped (02/18/21 1436)   DAPTOmycin (CUBICIN)  IV Stopped (02/17/21 2147)   dexmedetomidine (PRECEDEX) IV infusion Stopped (02/18/21 1046)   lactated ringers     lactated ringers 20 mL/hr at 02/17/21 1435   lactated ringers 20 mL/hr at 02/18/21 1700   milrinone 0.375 mcg/kg/min (02/18/21 1700)    I/O last 3 completed shifts: In: 6737.2 [I.V.:5055.8; Blood:600; IV Piggyback:1081.4] Out: 2946 [Urine:2615; Chest Tube:331]   CBC Latest Ref Rng & Units 02/18/2021 02/18/2021 02/18/2021  WBC 4.0 - 10.5 K/uL - - 22.3(H)  Hemoglobin 13.0 - 17.0 g/dL 14/04/2020) 2.5(Z) 5.6(L)  Hematocrit 39.0 - 52.0 % 27.0(L) 29.0(L) 29.8(L)  Platelets 150 - 400 K/uL - - 173    BMP Latest Ref Rng & Units 02/18/2021 02/18/2021 02/18/2021   Glucose 70 - 99 mg/dL 14/04/2020) - -  BUN 6 - 20 mg/dL 643(P) - -  Creatinine 29(J - 1.24 mg/dL 1.88) - -  Sodium 4.16(S - 145 mmol/L 136 137 -  Potassium 3.5 - 5.1 mmol/L 5.5(H) 5.6(H) 5.6(H)  Chloride 98 - 111 mmol/L 103 - -  CO2 22 - 32 mmol/L 25 - -  Calcium 8.9 - 10.3 mg/dL 063) - -    ABG    Component Value Date/Time   PHART 7.357 02/18/2021 0958   PCO2ART 47.9 02/18/2021 0958   PO2ART 175 (H) 02/18/2021 0958   HCO3 26.7 02/18/2021 0958   TCO2 28 02/18/2021 0958   ACIDBASEDEF 3.0 (H) 02/17/2021 1448   O2SAT 99.0 02/18/2021 0958       14/04/2020, MD 02/18/2021 6:00 PM

## 2021-02-18 NOTE — Progress Notes (Signed)
PHARMACY CONSULT NOTE FOR:  OUTPATIENT  PARENTERAL ANTIBIOTIC THERAPY (OPAT)  Indication: MV Endocarditis Regimen: Daptomycin 650 mg IV every 24 hours + Rocephin 2g IV every 24 hours End date: 03/30/21 (6 weeks after valve replacement)  IV antibiotic discharge orders are pended. To discharging provider:  please sign these orders via discharge navigator,  Select New Orders & click on the button choice - Manage This Unsigned Work.

## 2021-02-18 NOTE — Plan of Care (Signed)

## 2021-02-18 NOTE — Progress Notes (Signed)
RN pulled ABG after vent settings were changed, results showed pH 7.30, pCO2 55.2, and K+ 6.5.  RN then drew Bmp to compare and K+ came back as 6.7.  Dr. Dorris Fetch was notified regarding results and hyperkalemia protocol was started.  Another ABG was pulled resulting in worsening values.  pH 7.28, pCO2 58.3 and Dr. Dorris Fetch was notified again and was told to increase tidal volumes to 700 and to keep him intubated overnight.  Plan of care will continue.

## 2021-02-18 NOTE — Procedures (Signed)
Extubation Procedure Note  Patient Details:   Name: Mark Garrett DOB: 04-Apr-1980 MRN: [id number]   Airway Documentation:    Vent end date: 02/18/21 Vent end time: 1012   Evaluation  O2 sats: stable throughout Complications: No apparent complications Patient did tolerate procedure well. Bilateral Breath Sounds: Clear, Diminished   Yes  Positive cuff leak noted, NIF > -40, VC 1.66.  Pt placed on [place] 4L with humidity, no stridor noted, able to reach 1000 using the incentive spirometer.  Forest Becker Loxley Schmale 02/18/2021, 10:16 AM

## 2021-02-18 NOTE — Progress Notes (Signed)
Subjective: Patient on the ventilator   Antibiotics:  Anti-infectives (From admission, onward)    Start     Dose/Rate Route Frequency Ordered Stop   02/17/21 0400  vancomycin (VANCOREADY) IVPB 1250 mg/250 mL        1,250 mg 166.7 mL/hr over 90 Minutes Intravenous To Surgery 02/16/21 0949 02/17/21 0945   02/17/21 0400  ceFAZolin (ANCEF) IVPB 2g/100 mL premix        2 g 200 mL/hr over 30 Minutes Intravenous To Surgery 02/16/21 0949 02/17/21 0815   02/17/21 0400  ceFAZolin (ANCEF) IVPB 2g/100 mL premix        2 g 200 mL/hr over 30 Minutes Intravenous To Surgery 02/16/21 0949 02/17/21 1303   02/14/21 2000  DAPTOmycin (CUBICIN) 650 mg in sodium chloride 0.9 % IVPB        9 mg/kg  70 kg 126 mL/hr over 30 Minutes Intravenous Daily 02/14/21 0855     02/14/21 1400  cefTRIAXone (ROCEPHIN) 2 g in sodium chloride 0.9 % 100 mL IVPB        2 g 200 mL/hr over 30 Minutes Intravenous Every 24 hours 02/14/21 0854     02/11/21 1230  DAPTOmycin (CUBICIN) 550 mg in sodium chloride 0.9 % IVPB  Status:  Discontinued        550 mg 122 mL/hr over 30 Minutes Intravenous Daily 02/11/21 1148 02/14/21 0855   02/06/21 2100  vancomycin (VANCOREADY) IVPB 1000 mg/200 mL  Status:  Discontinued       See Hyperspace for full Linked Orders Report.   1,000 mg 200 mL/hr over 60 Minutes Intravenous Every 12 hours 02/06/21 0802 02/10/21 1031   02/06/21 0900  vancomycin (VANCOREADY) IVPB 1500 mg/300 mL       See Hyperspace for full Linked Orders Report.   1,500 mg 150 mL/hr over 120 Minutes Intravenous Every 12 hours 02/06/21 0802 02/06/21 1215   02/06/21 0900  ceFEPIme (MAXIPIME) 2 g in sodium chloride 0.9 % 100 mL IVPB  Status:  Discontinued        2 g 200 mL/hr over 30 Minutes Intravenous Every 8 hours 02/06/21 0802 02/14/21 0854   02/03/21 1600  vancomycin (VANCOREADY) IVPB 1750 mg/350 mL  Status:  Discontinued        1,750 mg 175 mL/hr over 120 Minutes Intravenous Every 24 hours 02/02/21 1541  02/02/21 1659   02/03/21 1600  vancomycin (VANCOREADY) IVPB 1000 mg/200 mL  Status:  Discontinued        1,000 mg 200 mL/hr over 60 Minutes Intravenous Every 24 hours 02/02/21 1659 02/04/21 0821   02/02/21 1630  ceFEPIme (MAXIPIME) 2 g in sodium chloride 0.9 % 100 mL IVPB  Status:  Discontinued        2 g 200 mL/hr over 30 Minutes Intravenous Every 8 hours 02/02/21 1535 02/04/21 0821   02/02/21 1630  vancomycin (VANCOREADY) IVPB 1500 mg/300 mL        1,500 mg 150 mL/hr over 120 Minutes Intravenous  Once 02/02/21 1535 02/02/21 1932   01/28/21 1700  cefTRIAXone (ROCEPHIN) 1 g in sodium chloride 0.9 % 100 mL IVPB  Status:  Discontinued        1 g 200 mL/hr over 30 Minutes Intravenous Every 24 hours 01/27/21 2119 01/28/21 1213   01/28/21 1300  amoxicillin-clavulanate (AUGMENTIN) 875-125 MG per tablet 1 tablet        1 tablet Oral Every 12 hours 01/28/21 1213 02/01/21 2116   01/27/21 1730  cefTRIAXone (ROCEPHIN) 1 g in sodium chloride 0.9 % 100 mL IVPB        1 g 200 mL/hr over 30 Minutes Intravenous  Once 01/27/21 1725 01/27/21 1831   01/27/21 1730  azithromycin (ZITHROMAX) 500 mg in sodium chloride 0.9 % 250 mL IVPB  Status:  Discontinued        500 mg 250 mL/hr over 60 Minutes Intravenous Every 24 hours 01/27/21 1725 01/28/21 1213       Medications: Scheduled Meds:  acetaminophen  1,000 mg Oral Q6H   Or   acetaminophen (TYLENOL) oral liquid 160 mg/5 mL  1,000 mg Per Tube Q6H   albuterol  2.5 mg Nebulization Q6H   aspirin EC  325 mg Oral Daily   Or   aspirin  324 mg Per Tube Daily   bisacodyl  10 mg Oral Daily   Or   bisacodyl  10 mg Rectal Daily   chlorhexidine gluconate (MEDLINE KIT)  15 mL Mouth Rinse BID   Chlorhexidine Gluconate Cloth  6 each Topical Daily   docusate  200 mg Per Tube Daily   enoxaparin (LOVENOX) injection  40 mg Subcutaneous QHS   mouth rinse  15 mL Mouth Rinse 10 times per day   [START ON 02/19/2021] pantoprazole  40 mg Oral Daily   rosuvastatin  40 mg  Per Tube Daily   sodium chloride flush  3 mL Intravenous Q12H   warfarin  2.5 mg Per Tube ONCE-1600   Warfarin - Physician Dosing Inpatient   Does not apply q1600   Continuous Infusions:  sodium chloride Stopped (02/18/21 0744)   sodium chloride     sodium chloride 10 mL/hr at 02/17/21 1435   albumin human 12.5 g (02/17/21 1805)   cefTRIAXone (ROCEPHIN)  IV Stopped (02/16/21 1951)   DAPTOmycin (CUBICIN)  IV Stopped (02/17/21 2147)   dexmedetomidine (PRECEDEX) IV infusion Stopped (02/18/21 1046)   epinephrine 2 mcg/min (02/18/21 1200)   insulin 0.5 Units/hr (02/18/21 1200)   lactated ringers     lactated ringers 20 mL/hr at 02/17/21 1435   lactated ringers 20 mL/hr at 02/18/21 1200   milrinone 0.375 mcg/kg/min (02/18/21 1200)   nitroGLYCERIN Stopped (02/17/21 1612)   norepinephrine (LEVOPHED) Adult infusion Stopped (02/18/21 1029)   phenylephrine (NEO-SYNEPHRINE) Adult infusion Stopped (02/18/21 0806)   PRN Meds:.sodium chloride, albumin human, dextrose, lactated ringers, midazolam, morphine injection, ondansetron (ZOFRAN) IV, oxyCODONE, sodium chloride flush, traMADol    Objective: Weight change: 5.428 kg  Intake/Output Summary (Last 24 hours) at 02/18/2021 1248 Last data filed at 02/18/2021 1200 Gross per 24 hour  Intake 5781.47 ml  Output 3403 ml  Net 2378.47 ml    Blood pressure 128/71, pulse 65, temperature 98.8 F (37.1 C), resp. rate 15, height 5' 11"  (1.803 m), weight 75.6 kg, SpO2 100 %. Temp:  [98.8 F (37.1 C)-100.2 F (37.9 C)] 98.8 F (37.1 C) (12/02 1230) Pulse Rate:  [57-81] 65 (12/02 1230) Resp:  [12-32] 15 (12/02 1230) BP: (65-168)/(55-77) 128/71 (12/02 1100) SpO2:  [60 %-100 %] 100 % (12/02 1230) Arterial Line BP: (71-162)/(42-71) 155/71 (12/02 1230) FiO2 (%):  [40 %-50 %] 40 % (12/02 0924) Weight:  [75.6 kg] 75.6 kg (12/02 0500)  Physical Exam: Physical Exam Constitutional:      Appearance: He is well-developed.     Interventions: He is  intubated.  HENT:     Head: Normocephalic and atraumatic.     Comments: Multiple teeth have been extracted Eyes:     General:  Right eye: No discharge.        Left eye: No discharge.     Conjunctiva/sclera: Conjunctivae normal.  Cardiovascular:     Rate and Rhythm: Regular rhythm. Tachycardia present.  Pulmonary:     Effort: He is intubated.  Abdominal:     General: There is no distension.     Palpations: Abdomen is soft. There is no mass.  Musculoskeletal:        General: Normal range of motion.     Cervical back: Normal range of motion and neck supple.  Skin:    General: Skin is warm and dry.     Findings: No erythema or rash.  Neurological:     General: No focal deficit present.     Mental Status: He is alert and oriented to person, place, and time.  Psychiatric:        Mood and Affect: Mood normal.        Behavior: Behavior normal.        Thought Content: Thought content normal.        Judgment: Judgment normal.    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      Liver Panel  No results for input(s): PROT, ALBUMIN, AST, ALT, ALKPHOS, BILITOT, BILIDIR, IBILI in the last 72 hours.     Sedimentation Rate No results for input(s): ESRSEDRATE in the last 72 hours. C-Reactive Protein No results for input(s): CRP in the last 72 hours.  Micro Results: Recent Results (from the past  720 hour(s))  Resp Panel by RT-PCR (Flu A&B, Covid) Nasopharyngeal Swab     Status: None   Collection Time: 01/27/21 10:09 AM   Specimen: Nasopharyngeal Swab; Nasopharyngeal(NP) swabs in vial transport medium  Result Value Ref Range Status   SARS Coronavirus 2 by RT PCR NEGATIVE NEGATIVE Final    Comment: (NOTE) SARS-CoV-2 target nucleic acids are NOT DETECTED.  The SARS-CoV-2 RNA is generally detectable in upper respiratory specimens during the acute phase of infection. The lowest concentration of SARS-CoV-2 viral copies this assay can detect is 138 copies/mL. A negative result does not preclude SARS-Cov-2 infection and should not be used as the sole basis for treatment or other patient management decisions. A negative result may occur with  improper specimen collection/handling, submission of specimen other than nasopharyngeal swab, presence of viral mutation(s) within the areas targeted by this assay, and inadequate number of viral copies(<138 copies/mL). A negative result must be combined with clinical  observations, patient history, and epidemiological information. The expected result is Negative.  Fact Sheet for Patients:  EntrepreneurPulse.com.au  Fact Sheet for Healthcare Providers:  IncredibleEmployment.be  This test is no t yet approved or cleared by the [place] FDA and  has been authorized for detection and/or diagnosis of SARS-CoV-2 by FDA under an Emergency Use Authorization (EUA). This EUA will remain  in effect (meaning this test can be used) for the duration of the COVID-19 declaration under Section 564(b)(1) of the Act, 21 U.S.C.section 360bbb-3(b)(1), unless the authorization is terminated  or revoked sooner.       Influenza A by PCR NEGATIVE NEGATIVE Final   Influenza B by PCR NEGATIVE NEGATIVE Final    Comment: (NOTE) The Xpert Xpress SARS-CoV-2/FLU/RSV plus assay is intended as an aid in the diagnosis of influenza  from Nasopharyngeal swab specimens and should not be used as a sole basis for treatment. Nasal washings and aspirates are unacceptable for Xpert Xpress SARS-CoV-2/FLU/RSV testing.  Fact Sheet for Patients: EntrepreneurPulse.com.au  Fact Sheet for Healthcare Providers: IncredibleEmployment.be  This test is not yet approved or cleared by the [place] FDA and has been authorized for detection and/or diagnosis of SARS-CoV-2 by FDA under an Emergency Use Authorization (EUA). This EUA will remain in effect (meaning this test can be used) for the duration of the COVID-19 declaration under Section 564(b)(1) of the Act, 21 U.S.C. section 360bbb-3(b)(1), unless the authorization is terminated or revoked.  Performed at [facility], [place] [address] [place], [place] [address]   C Difficile Quick Screen w PCR reflex     Status: None   Collection Time: 01/27/21  8:47 PM   Specimen: STOOL  Result Value Ref Range Status   C Diff antigen NEGATIVE NEGATIVE Final   C Diff toxin NEGATIVE NEGATIVE Final   C Diff interpretation No C. difficile detected.  Final    Comment: Performed at [facility], [place] [address], [place], [place] [address]  Culture, blood (Routine X 2) w Reflex to ID Panel     Status: None   Collection Time: 01/27/21 10:59 PM   Specimen: BLOOD  Result Value Ref Range Status   Specimen Description BLOOD LEFT ANTECUBITAL  Final   Special Requests   Final    BOTTLES DRAWN AEROBIC AND ANAEROBIC Blood Culture adequate volume   Culture   Final    NO GROWTH 5 DAYS Performed at [facility], [place] [address], [place], [place] [address]    Report Status 02/01/2021 FINAL  Final  Culture, blood (Routine X 2) w Reflex to ID Panel     Status: None   Collection Time: 01/27/21 10:59 PM   Specimen: BLOOD LEFT HAND  Result Value Ref Range Status   Specimen Description BLOOD LEFT HAND  Final   Special Requests   Final    BOTTLES DRAWN  AEROBIC AND ANAEROBIC Blood Culture adequate volume   Culture   Final    NO GROWTH 5 DAYS Performed at [facility], [place] [address], [place], [place] [address]    Report Status 02/01/2021 FINAL  Final  MRSA Next Gen by PCR, Nasal     Status: None   Collection Time: 01/31/21  7:04 PM   Specimen: Nasal Mucosa; Nasal Swab  Result Value Ref Range Status   MRSA by PCR Next Gen NOT DETECTED NOT DETECTED Final    Comment: (NOTE) The GeneXpert MRSA Assay (FDA approved for NASAL specimens only), is one  component of a comprehensive MRSA colonization surveillance program. It is not intended to diagnose MRSA infection nor to guide or monitor treatment for MRSA infections. Test performance is not FDA approved in patients less than 20 years old. Performed at [facility], [place] [address], [place], [place] [address]   Culture, blood (routine x 2)     Status: None   Collection Time: 02/02/21  2:34 PM   Specimen: BLOOD  Result Value Ref Range Status   Specimen Description BLOOD RIGHT ANTECUBITAL  Final   Special Requests   Final    BOTTLES DRAWN AEROBIC AND ANAEROBIC Blood Culture adequate volume   Culture   Final    NO GROWTH 5 DAYS Performed at [facility], [place] [address], [place], [place] [address]    Report Status 02/07/2021 FINAL  Final  Culture, blood (routine x 2)     Status: None   Collection Time: 02/02/21  2:34 PM   Specimen: BLOOD RIGHT HAND  Result Value Ref Range Status   Specimen Description BLOOD RIGHT HAND  Final   Special Requests   Final    BOTTLES DRAWN AEROBIC AND ANAEROBIC Blood Culture adequate volume   Culture   Final    NO GROWTH 5 DAYS Performed at [facility], [place] [address], [place], [place] [address]    Report Status 02/07/2021 FINAL  Final  Culture, blood (routine x 2)     Status: None   Collection Time: 02/04/21  8:05 AM   Specimen: BLOOD  Result Value Ref Range Status   Specimen Description BLOOD RIGHT ANTECUBITAL  Final    Special Requests   Final    BOTTLES DRAWN AEROBIC AND ANAEROBIC Blood Culture adequate volume   Culture   Final    NO GROWTH 5 DAYS Performed at [facility], [place] [address] [place], [place] [address]    Report Status 02/09/2021 FINAL  Final  Culture, blood (routine x 2)     Status: None   Collection Time: 02/04/21  8:05 AM   Specimen: BLOOD  Result Value Ref Range Status   Specimen Description BLOOD BLOOD RIGHT HAND  Final   Special Requests AEROBIC BOTTLE ONLY Blood Culture adequate volume  Final   Culture   Final    NO GROWTH 5 DAYS Performed at [facility], [place] [address] [place], [place] [address]    Report Status 02/09/2021 FINAL  Final  Fungus culture, blood     Status: None   Collection Time: 02/05/21 10:41 AM   Specimen: BLOOD  Result Value Ref Range Status   Specimen Description BLOOD RIGHT ANTECUBITAL  Final   Special Requests   Final    BOTTLES DRAWN AEROBIC AND ANAEROBIC Blood Culture adequate volume   Culture   Final    NO GROWTH 7 DAYS NO FUNGUS ISOLATED Performed at [facility], [place] [address] [place], [place] [address]    Report Status 02/12/2021 FINAL  Final  Fungus culture, blood     Status: None   Collection Time: 02/05/21 10:46 AM   Specimen: BLOOD  Result Value Ref Range Status   Specimen Description BLOOD RIGHT ANTECUBITAL  Final   Special Requests   Final    BOTTLES DRAWN AEROBIC AND ANAEROBIC Blood Culture adequate volume   Culture   Final    NO GROWTH 7 DAYS NO FUNGUS ISOLATED Performed at [facility], [place] [address], [place],  [address]  Report Status 02/12/2021 FINAL  Final  Acid Fast Smear (AFB)     Status: None   Collection Time: 02/05/21 12:08 PM   Specimen: Vein; Blood  Result Value Ref Range Status   AFB Specimen Processing Concentration  Final    Comment: (NOTE) Performed At: [facility] [place], [place] [id number] Rush Farmer MD DX:[id number]    Acid Fast Smear  QNSAFB  Final    Comment: (NOTE) Test not performed. AFB Smear not performed due to specimen source (blood) or insufficient specimen.    Source (AFB) 2 GREEN SODIUM HEP  Final    Comment: Performed at [facility], [place] [address], [place], [place] [address]  Acid Fast Smear (AFB)     Status: None   Collection Time: 02/05/21 12:08 PM   Specimen: Vein; Blood  Result Value Ref Range Status   AFB Specimen Processing Concentration  Final    Comment: (NOTE) Performed At: [facility] [place], [place] [id number] Rush Farmer MD HU:[id number]    Acid Fast Smear QNSAFB  Final    Comment: (NOTE) Test not performed. AFB Smear not performed due to specimen source (blood) or insufficient specimen.    Source (AFB) 2 GREEN SODIUM HEP  Corrected    Comment: Performed at [facility], [place] [address] [place], [place] [address] CORRECTED ON 11/21 AT 0946: PREVIOUSLY REPORTED AS 4 GREEN SODIUM HEP   Culture, blood (Routine X 2) w Reflex to ID Panel     Status: None   Collection Time: 02/10/21  9:42 AM   Specimen: BLOOD  Result Value Ref Range Status   Specimen Description BLOOD RIGHT ANTECUBITAL  Final   Special Requests   Final    BOTTLES DRAWN AEROBIC AND ANAEROBIC Blood Culture adequate volume   Culture   Final    NO GROWTH 5 DAYS Performed at [facility], [place] [address] [place], [place] [address]    Report Status 02/15/2021 FINAL  Final  Culture, blood (Routine X 2) w Reflex to ID Panel     Status: None   Collection Time: 02/10/21  9:47 AM   Specimen: BLOOD  Result Value Ref Range Status   Specimen Description BLOOD LEFT ANTECUBITAL  Final   Special Requests   Final    BOTTLES DRAWN AEROBIC AND ANAEROBIC Blood Culture adequate volume   Culture   Final    NO GROWTH 5 DAYS Performed at [facility], [place] [address] [place], [place] [address]    Report Status 02/15/2021 FINAL  Final  Surgical pcr screen     Status: None   Collection  Time: 02/16/21  8:35 PM   Specimen: Nasal Mucosa; Nasal Swab  Result Value Ref Range Status   MRSA, PCR NEGATIVE NEGATIVE Final   Staphylococcus aureus NEGATIVE NEGATIVE Final    Comment: (NOTE) The Xpert SA Assay (FDA approved for NASAL specimens in patients 72 years of age and older), is one component of a comprehensive surveillance program. It is not intended to diagnose infection nor to guide or monitor treatment. Performed at [facility], [place] [address] [place], [place] [address]   Aerobic/Anaerobic Culture w Gram Stain (surgical/deep wound)     Status: None (Preliminary result)   Collection Time: 02/17/21 11:20 AM   Specimen: Mitral Valve Leaflets; Tissue  Result Value Ref Range Status   Specimen Description TISSUE  Final   Special Requests  MITRAL VALVE LEAFLETS  Final  Gram Stain   Final    FEW WBC PRESENT,BOTH PMN AND MONONUCLEAR NO ORGANISMS SEEN    Culture   Final    NO GROWTH < 24 HOURS Performed at [place] [address], [place], [place] [address]    Report Status PENDING  Incomplete    Studies/Results: DG Chest 2 View  Result Date: 02/16/2021 CLINICAL DATA:  Preoperative evaluation. EXAM: CHEST - 2 VIEW COMPARISON:  January 29, 2021 FINDINGS: Multiple sternal wires are noted. There is stable left internal jugular venous catheter positioning. A stable ill-defined opacity is seen within the lateral aspect of the mid right lung. There is no evidence of a pleural effusion or pneumothorax. The cardiac silhouette is mildly enlarged and unchanged in size. A chronic fracture is seen involving the mid right clavicle. IMPRESSION: 1. Evidence of prior median sternotomy/CABG. 2. Stable area of airspace disease within the lateral aspect of the mid to lower right lung. Electronically Signed   By: Virgina [place] M.D.   On: 02/16/2021 21:38   DG Chest Port 1 View  Result Date: 02/18/2021 CLINICAL DATA:  40 year old male status post mitral valve  replacement. EXAM: PORTABLE CHEST 1 VIEW COMPARISON:  Chest x-ray 02/17/2021. FINDINGS: An endotracheal tube is in place with tip 6.9 cm above the carina. There is a left-sided internal jugular central venous catheter with tip terminating in the innominate vein. Right IJ Cordis through which a Swan-Ganz catheter has been passed into the proximal right main pulmonary artery. Bilateral chest tubes in position with tips projecting over the mid thorax bilaterally. Midline mediastinal/pericardial drain. Additional tube projecting over the lower mediastinum and lower left hemithorax may represent an additional chest tube or additional mediastinal/pericardial drain. Lung volumes are normal. Patchy ill-defined opacities are noted in the periphery of the right middle lobe, concerning for area of airspace consolidation. Lungs otherwise appear clear. No pleural effusions. No pneumothorax. No evidence of pulmonary edema. Mild to moderate cardiomegaly. Upper mediastinal contours are within normal limits. Status post median sternotomy for CABG and mitral valve replacement. IMPRESSION: 1. Postoperative changes and support apparatus, as above. 2. Peripheral consolidation in the lateral segment of the right middle lobe concerning for pneumonia. 3. Mild-to-moderate cardiomegaly. Electronically Signed   By: Vinnie Langton M.D.   On: 02/18/2021 08:56   DG Chest Port 1 View  Result Date: 02/17/2021 CLINICAL DATA:  Status post mitral valve replacement. EXAM: PORTABLE CHEST 1 VIEW COMPARISON:  One day prior FINDINGS: 2 AP portable radiographs with exclusion of the right costophrenic angle. Repeat median sternotomy. Endotracheal tube terminates 6.9 cm above carina. Nasogastric tube terminates at the body of the stomach with the side port just below the gastroesophageal junction. Left internal jugular line tip likely at the central left brachiocephalic vein. Mediastinal drain. Right internal jugular Swan with tip at pulmonary outflow  tract. Three left and 1 right-sided chest tube. Midline trachea. Moderate cardiomegaly. No left and no large right pleural effusion. No pneumothorax. No congestive failure. Lateral right lung airspace disease is similar. IMPRESSION: Support apparatus as detailed above. No pneumothorax or other acute complication. Similar lateral right lower lobe airspace disease. Electronically Signed   By: Abigail Miyamoto M.D.   On: 02/17/2021 15:25      Assessment/Plan:  INTERVAL HISTORY: Patient is status post coronary artery bypass grafting and mitral valve replacement  Principal Problem:   Acute systolic CHF (congestive heart failure) (HCC) Active Problems:   CAP (community acquired pneumonia)   Pleural effusion on right  Leukocytosis   Microcytic anemia   NSTEMI (non-ST elevated myocardial infarction) (HCC)   DVT (deep venous thrombosis) (HCC)   Cigarette smoker   COPD (chronic obstructive pulmonary disease) (HCC)   Asthma   Severe mitral regurgitation   Serum total bilirubin elevated   Paroxysmal VT   Cardiogenic shock (HCC)   Endocarditis of mitral valve   Long term current use of anticoagulant therapy   Encounter for preoperative dental examination   Caries   Retained tooth root   Chronic apical periodontitis   Accretions on teeth   Teeth missing   Excessive dental attrition   Chronic periodontitis   Protein-calorie malnutrition, severe   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HCC)   S/P MVR (mitral valve replacement)   S/P CABG x 1    Mark Garrett is a 41 y.o. male with coronary artery disease status post coronary bypass grafting DVT who was initially admitted for shortness of breath.  Chest x-ray had shown some patchy opacity in the right lung with CT showing a right middle lobe and small bilateral lobe opacities.  Patient has been on ceftriaxone and azithromycin and then changed over to Augmentin.  Bilateral lower extremity Doppler had shown an indeterminate DVT.  He then  underwent TEE which showedvegetation on the anterior and posterior mitral valve leaflets on transesophageal echocardiogram with severe mitral regurgitation.  His blood cultures that were taken on admission on 10 November failed to grow any organism.  He has had repeat blood cultures on antibiotics on the 16th and was changed to vancomycin and cefepime.  Repeat blood cultures on antibiotics failed to yield an organism.  We formally saw the patient on the 16th (Greg Calone and Laurice Record, MD) and had recommended observing him off antibiotics with a low threshold for restarting them.  We did Bartonella antibodies which were all negative as well as Brucella antibodies and Legionella antibodies again which were negative.  Patient did have an ANA which was negative.  He was restarted back on vancomycin and cefepime on the 18th and has been on these antibiotics since then.  He then had problems with cardiogenic shock requiring milrinone.  He was awaiting mitral valve surgery on 1 December.  He had had a rise in his white blood cell count and I was called back to see the patient and felt that it was most likely due to his teeth having been extracted.  I change his antibiotics to daptomycin and ceftriaxone avoid nephrotoxic toxicity of the former.  He has now undergone valve replacement which has been sent for culture and pathology.  #1 Mitral valve endocarditis:  Follow-up tissue cultures  I will send tissue also delivers to [facility] for PCR testing  Would continue his daptomycin and ceftriaxone in the interim  Would push for 6 weeks of postoperative antibiotics.  I spent 35mnutes with the patient including face to face counseling of the patient guarding his endocarditis, personally reviewing his chest x-ray CT scans updated culture data along with p review of medical records before and during the visit and in coordination of his care.    Comer is covering this weekend and is  available for questions and will  follow-up culture data       LOS: 22 days   CAlcide Evener12/04/2020, 12:48 PM

## 2021-02-19 ENCOUNTER — Inpatient Hospital Stay (HOSPITAL_COMMUNITY): Payer: Self-pay

## 2021-02-19 DIAGNOSIS — I349 Nonrheumatic mitral valve disorder, unspecified: Secondary | ICD-10-CM

## 2021-02-19 LAB — ECHOCARDIOGRAM LIMITED
AR max vel: 1.76 cm2
AV Area VTI: 1.7 cm2
AV Area mean vel: 1.36 cm2
AV Mean grad: 14 mmHg
AV Peak grad: 21.9 mmHg
Ao pk vel: 2.34 m/s
Area-P 1/2: 2.27 cm2
Height: 71 in
MV VTI: 1.21 cm2
P 1/2 time: 429 msec
Weight: 2546.75 oz

## 2021-02-19 LAB — COOXEMETRY PANEL
Carboxyhemoglobin: 2.9 % — ABNORMAL HIGH (ref 0.5–1.5)
Carboxyhemoglobin: 2.5 % — ABNORMAL HIGH (ref 0.5–1.5)
Methemoglobin: 0.8 % (ref 0.0–1.5)
Methemoglobin: 0.8 % (ref 0.0–1.5)
O2 Saturation: 99.5 %
O2 Saturation: 74.6 %
Total hemoglobin: 7.1 g/dL — ABNORMAL LOW (ref 12.0–16.0)
Total hemoglobin: 7.1 g/dL — ABNORMAL LOW (ref 12.0–16.0)

## 2021-02-19 LAB — GLUCOSE, CAPILLARY
Glucose-Capillary: 105 mg/dL — ABNORMAL HIGH (ref 70–99)
Glucose-Capillary: 109 mg/dL — ABNORMAL HIGH (ref 70–99)
Glucose-Capillary: 109 mg/dL — ABNORMAL HIGH (ref 70–99)
Glucose-Capillary: 121 mg/dL — ABNORMAL HIGH (ref 70–99)
Glucose-Capillary: 88 mg/dL (ref 70–99)
Glucose-Capillary: 90 mg/dL (ref 70–99)

## 2021-02-19 LAB — PROTIME-INR
INR: 1.5 — ABNORMAL HIGH (ref 0.8–1.2)
Prothrombin Time: 17.9 seconds — ABNORMAL HIGH (ref 11.4–15.2)

## 2021-02-19 LAB — BASIC METABOLIC PANEL
Anion gap: 5 (ref 5–15)
BUN: 18 mg/dL (ref 6–20)
CO2: 26 mmol/L (ref 22–32)
Calcium: 8.4 mg/dL — ABNORMAL LOW (ref 8.9–10.3)
Chloride: 102 mmol/L (ref 98–111)
Creatinine, Ser: 0.96 mg/dL (ref 0.61–1.24)
GFR, Estimated: >60 mL/min (ref >60)
Glucose, Bld: 119 mg/dL — ABNORMAL HIGH (ref 70–99)
Potassium: 4.4 mmol/L (ref 3.5–5.1)
Sodium: 133 mmol/L — ABNORMAL LOW (ref 135–145)

## 2021-02-19 LAB — CBC
HCT: 24.3 % — ABNORMAL LOW (ref 39.0–52.0)
Hemoglobin: 7.3 g/dL — ABNORMAL LOW (ref 13.0–17.0)
MCH: 23.9 pg — ABNORMAL LOW (ref 26.0–34.0)
MCHC: 30 g/dL (ref 30.0–36.0)
MCV: 79.7 fL — ABNORMAL LOW (ref 80.0–100.0)
Platelets: 122 10*3/uL — ABNORMAL LOW (ref 150–400)
RBC: 3.05 MIL/uL — ABNORMAL LOW (ref 4.22–5.81)
WBC: 15.9 10*3/uL — ABNORMAL HIGH (ref 4.0–10.5)
nRBC: 0 % (ref 0.0–0.2)

## 2021-02-19 LAB — PREPARE RBC (CROSSMATCH)

## 2021-02-19 MED ORDER — ALBUTEROL SULFATE (2.5 MG/3ML) 0.083% IN NEBU
2.5000 mg | INHALATION_SOLUTION | Freq: Two times a day (BID) | RESPIRATORY_TRACT | Status: DC
  Administered 2021-02-20 – 2021-02-22 (×5): 2.5 mg via RESPIRATORY_TRACT
  Filled 2021-02-19 (×6): qty 3

## 2021-02-19 MED ORDER — WARFARIN SODIUM 2.5 MG PO TABS
2.5000 mg | ORAL_TABLET | Freq: Once | ORAL | Status: AC
  Administered 2021-02-19: 2.5 mg
  Filled 2021-02-19: qty 1

## 2021-02-19 MED ORDER — ORAL CARE MOUTH RINSE
15.0000 mL | Freq: Four times a day (QID) | OROMUCOSAL | Status: DC
  Administered 2021-02-19 – 2021-02-21 (×8): 15 mL via OROMUCOSAL

## 2021-02-19 MED ORDER — SILDENAFIL CITRATE 20 MG PO TABS
20.0000 mg | ORAL_TABLET | Freq: Three times a day (TID) | ORAL | Status: DC
  Administered 2021-02-19 – 2021-02-20 (×4): 20 mg via ORAL
  Filled 2021-02-19 (×4): qty 1

## 2021-02-19 MED ORDER — SODIUM CHLORIDE 0.9% IV SOLUTION: Freq: Once | INTRAVENOUS | Status: AC

## 2021-02-19 NOTE — Progress Notes (Addendum)
   PA pressures high this am  PAP 80/15 (PAPs was in 50s)  I flushed line and re-zeroed. Still in the 80s. Valve sounds crisp. No acute MR murmur on exam  PCP 15 with no significant v-waves. Remains on milrinone 0.375.   Denies CP or hemoptysis.  No LE swelling or concern for DVT on exam.   Will check co-ox and limited echo. Start sildenafil.   Full note to follow  Arvilla Meres, MD  4:41 AM

## 2021-02-19 NOTE — Progress Notes (Signed)
Patient ID: Mark Garrett, male   DOB: 07/24/1980, 40 y.o.   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  Extensive vegetation noted on excised valve. PA pressures 90-100 in OR  Remains on milrinone 0.375. Denies CP or SOB.    Swan:  PAP: (59-96)/(14-31) 86/14 CVP:  [4 mmHg-23 mmHg] 4 mmHg CO:  [6.2 L/min-7.1 L/min] 7.1 L/min CI:  [3.3 L/min/m2-3.8 L/min/m2] 3.8 L/min/m2  PCWP 12  TEE (11/16): EF 35-40%, there was mobile vegetation on both the anterior and posterior mitral leaflets.  The leaflets did not completely coapt (?destruction from endocarditis) with severe mitral regurgitation (ERO 0.56 cm^2).  There was extensive plaque in the proximal descending thoracic aorta with what appears to be mobile vegetation attached to it.   LHC/RHC:   Coronary Findings  Diagnostic Dominance: Right Left Anterior Descending  Mid LAD lesion is 95% stenosed.  Ramus Intermedius  Ramus-1 lesion is 50% stenosed.  Ramus-2 lesion is 80% stenosed.  Right Coronary Artery  Mid RCA lesion is 30% stenosed.  Graft To PLV  Origin lesion is 100% stenosed.  LIMA Graft To Dist LAD  Intervention  No interventions have been documented. Right Heart  Right Heart Pressures RHC Procedural Findings: Hemodynamics (mmHg) on milrinone 0.375 RA mean 18 RV 69/20 PA 76/25, mean 44 PCWP mean 22, v-waves to 37 mmHg LV 100/23 AO 100/60  Oxygen saturations: PA 58% AO 97%  Cardiac Output (Fick) 5.33  Cardiac Index (Fick) 2.67 PVR 4.1 WU  Cardiac Output (Thermo) 4.89 Cardiac Index (Thermo) 2.45  PVR 4.5 WU  PAPI: 2.8    Objective:   Weight Range: 72.2 kg Body mass index is 22.2 kg/m.   Vital Signs:   Temp:  [97.7 F (36.5 C)-99.5 F (37.5 C)] 98.1 F (36.7 C) (12/03 1000) Pulse Rate:  [61-73] 69 (12/03 1000) Resp:  [15-30] 18 (12/03 1000) BP: (70-165)/(53-139) 103/76  (12/03 0900) SpO2:  [85 %-100 %] 100 % (12/03 1000) Arterial Line BP: (118-175)/(49-74) 135/52 (12/03 1000) Weight:  [72.2 kg] 72.2 kg (12/03 0733) Last BM Date: 02/15/21  Weight change: Filed Weights   02/17/21 0608 02/18/21 0500 02/19/21 0733  Weight: 70.2 kg 75.6 kg 72.2 kg    Intake/Output:   Intake/Output Summary (Last 24 hours) at 02/19/2021 1222 Last data filed at 02/19/2021 1200 Gross per 24 hour  Intake 1711.38 ml  Output 1744 ml  Net -32.62 ml       Physical Exam   General:  Lying flat in bed No resp difficulty HEENT: normal Neck: supple. RIJ swan Carotids 2+ bilat; no bruits. No lymphadenopathy or thryomegaly appreciated. Cor: Sternal dressing ok. +CTs Regular rate & rhythm. Mechanical s1 crisp No MR Lungs: clear Abdomen: soft, nontender, nondistended. No hepatosplenomegaly. No bruits or masses. Good bowel sounds. Extremities: no cyanosis, clubbing, rash, edema s/p L foot amp  Neuro: alert & orientedx3, cranial nerves grossly intact. moves all 4 extremities w/o difficulty. Affect pleasant  Telemetry   SR 60-70s (personally reviewed)   Labs    CBC Recent Labs    02/18/21 1721 02/19/21 0351  WBC 17.1* 15.9*  HGB 8.0* 7.3*  HCT 27.7* 24.3*  MCV 82.0 79.7*  PLT 134* 122*    Basic Metabolic Panel Recent Labs    02/18/21 0158 02/18/21 0312 02/18/21 1400 02/18/21 1721 02/18/21 2137 02/19/21 0351  NA 134*   < >  136  --   --  133*  K 5.7*   < > 5.5* 5.1 4.3 4.4  CL 102  --  103  --   --  102  CO2 24  --  25  --   --  26  GLUCOSE 127*  --  111*  --   --  119*  BUN 24*  --  21*  --   --  18  CREATININE 1.27*  --  1.32*  --   --  0.96  CALCIUM 8.5*  --  8.8*  --   --  8.4*  MG 3.0*  --   --  2.1  --   --    < > = values in this interval not displayed.    Liver Function Tests No results for input(s): AST, ALT, ALKPHOS, BILITOT, PROT, ALBUMIN in the last 72 hours.  No results for input(s): LIPASE, AMYLASE in the last 72 hours. Cardiac  Enzymes No results for input(s): CKTOTAL, CKMB, CKMBINDEX, TROPONINI in the last 72 hours.   BNP: BNP (last 3 results) Recent Labs    01/27/21 1034  BNP 1,733.0*     ProBNP (last 3 results) No results for input(s): PROBNP in the last 8760 hours.   D-Dimer No results for input(s): DDIMER in the last 72 hours. Hemoglobin A1C Recent Labs    02/16/21 2033  HGBA1C 4.7*    Fasting Lipid Panel No results for input(s): CHOL, HDL, LDLCALC, TRIG, CHOLHDL, LDLDIRECT in the last 72 hours.  Thyroid Function Tests No results for input(s): TSH, T4TOTAL, T3FREE, THYROIDAB in the last 72 hours.  Invalid input(s): FREET3  Other results:   Imaging    DG Chest Port 1 View  Result Date: 02/19/2021 CLINICAL DATA:  Status post CABG. Status post mitral valve replacement. EXAM: PORTABLE CHEST 1 VIEW COMPARISON:  Chest x-ray dated 02/18/2021. FINDINGS: Endotracheal tube has been removed. LEFT IJ central line has been removed. Enteric tube has been removed. Swan-Ganz catheter remains with tip just to the RIGHT of midline. Bilateral chest tubes are stable in position. Mediastinal drain remains. Heart size and mediastinal contours are stable. Median sternotomy wires appear intact and symmetric in alignment. Probable mild atelectasis and/or small pleural effusion within the RIGHT lower lung. No pneumothorax is seen. IMPRESSION: 1. Probable mild atelectasis and/or small pleural effusion within the RIGHT lower lung. No pneumothorax seen. 2. Interval removal of the endotracheal tube, LEFT IJ central line and enteric tube. Remaining support apparatus appears adequately positioned. Electronically Signed   By: Franki Cabot M.D.   On: 02/19/2021 08:55     Medications:     Scheduled Medications:  acetaminophen  1,000 mg Oral Q6H   Or   acetaminophen (TYLENOL) oral liquid 160 mg/5 mL  1,000 mg Per Tube Q6H   albuterol  2.5 mg Nebulization Q6H   aspirin EC  325 mg Oral Daily   bisacodyl  10 mg Oral  Daily   Or   bisacodyl  10 mg Rectal Daily   chlorhexidine gluconate (MEDLINE KIT)  15 mL Mouth Rinse BID   Chlorhexidine Gluconate Cloth  6 each Topical Daily   docusate sodium  200 mg Oral Daily   enoxaparin (LOVENOX) injection  40 mg Subcutaneous QHS   feeding supplement  237 mL Oral TID BM   insulin aspart  0-15 Units Subcutaneous TID WC   mouth rinse  15 mL Mouth Rinse QID   pantoprazole  40 mg Oral Daily   rosuvastatin  40  mg Oral Daily   sildenafil  20 mg Oral TID   sodium chloride flush  3 mL Intravenous Q12H   Warfarin - Physician Dosing Inpatient   Does not apply q1600    Infusions:  sodium chloride 20 mL/hr at 02/19/21 1200   sodium chloride     sodium chloride 10 mL/hr at 02/17/21 1435   cefTRIAXone (ROCEPHIN)  IV Stopped (02/18/21 1436)   DAPTOmycin (CUBICIN)  IV Stopped (02/18/21 2240)   lactated ringers     lactated ringers 20 mL/hr at 02/17/21 1435   lactated ringers 20 mL/hr at 02/19/21 1200   milrinone 0.375 mcg/kg/min (02/19/21 1200)    PRN Medications: sodium chloride, hydrALAZINE, lactated ringers, midazolam, morphine injection, ondansetron (ZOFRAN) IV, oxyCODONE, sodium chloride flush, traMADol    Assessment/Plan   1. Acute systolic CHF: Ischemic cardiomyopathy, symptoms worsened by severe MR.  Echo with EF 35-40%, severe hypokinesis inferior and inferolateral walls, moderate LV dilation with mild LVH, mild RV dilation with moderately decreased systolic function, restricted posterior mitral leaflet and calcified mitral valve with severe mitral regurgitation and at least mild mitral stenosis (mean gradient 8 mmHg), PASP 60. Per report, echo at [facility] showed EF 55-60% with severe MR in 10/22.  It was a technically difficult study per report.  It is possible that he had a new ACS event prior to admission with reported drop in EF though HS-TnI with mild elevation and no trend suggests that it was not immediately prior to admission.  Low output noted with co-ox  39% initially, lactate elevated when he was initially admitted.  Milrinone begun with stabilization.  Now s/p mechanical MVR and SVG-ramus.  Stable cardiac index 3.8 with co-ox 61%, MAP elevated.  On milrinone 0.375 co-ox 75%. PA pressures markedly elevated (were that way in OR). PCWP 12. Weight up 5 pounds from baseline - will start sildenafil - continue milrinone - give lasix x 1 2. CAD: S/p CABG 2006.  As above, with fall in EF and CHF exacerbation, possible ACS prior to admission.  However, mild HS-TnI elevation with no trend suggests that ACS was not immediately prior to admission.  Current elevation is likely demand ischemia from volume overload. LHC on 11/14 showed patent LIMA-LAD with SVG occluded at aorta (does not look new); there was complex 80% proximal ramus stenosis.  Initially assumed that the SVG was to the ramus, but subsequently got the CABG operative report from [place] and it looks like it was an SVG-PLV.  There was minimal native RCA disease. LAD territory well-supplied by LIMA. Now s/p SVG-ramus on 12/1. No s/s angina - ASA  - Crestor 40 3.  Mitral valve disease: Initial echo showed posterior MV leaflet restricted with severe MR, suspected primarily infarct-related MR given LV dilation and inferior/inferolateral severe hypokinesis.  However, TEE on 11/16 showed vegetation (not bulky but clearly present) on the posterior and anterior leaflets with poor leaflet coaptation, suggesting endocarditis may be the major cause of severe MR.  Reviewed with ID, suspect the mitral vegetations have been present for a while (severe MR on 10/22 echo as well, does not appear to have active infection).  Cannot rule out nonbacterial thrombotic endocarditis (had recent DVT also), but somewhat academic at this point as he will be on anticoagulation for the non-triggered DVT and we are going to treat with antibiotic course given presentation and high WBCs. ANA negative. Extensive vegetation noted on MV at  time of MVR, sent for cultures.  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   -  Warfarin started per TCTS 4. 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  ?due to sedentary lifestyle + ?genetic predisposition.  He is not a Xarelto failure, had DVT found in 10/22 with Xarelto started at this point, 11/22 dopplers showed partially treated DVTs.  - He will be anticoagulated with mechanical valve.  5. Smoking: Needs to quit.  6. PNA: Cultures 11/18 NGTD.  RML PNA on CT chest.  - He completed course of abx initially for PNA.  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 He is able to walk on the stump.  - PT following, continue to mobilize => walk daily in hall.  8. 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  There is also mobile vegetation that appears adherent to plaque in the proximal descending thoracic aorta.  CT head showed no evidence for embolic disease.  Possible source would be due to poor dentition. Blood cultures from 11/10 and 11/16 NGTD.  He was initially on abx for PNA.  We re-drew cultures on 11/16 and 11/24, all negative so far.  ESR is 1, CRP 1.7.  He had been already partially treated when we found the endocarditis (abx for PNA) and suspect that the endocarditis is old as he had severe MR also on echo in 10/22. ID following.  WBCs rose to >30 after teeth removed but now coming back down (13 today ).  Suspect worsening leukocytosis was a response to dental surgery. Extensive vegetation noted on MV at time of surgery, sample sent for culture. WBC coming down. Tmax 99.5 - Per ID, continue 6 wks of IV abx to 12/27 =>ceftriaxone and daptomycin.    9. Microcytic anemia: Post-op drop, hgb 7.3.  - Iron stores low, had Feraheme  - Transfuse hgb < 8. Will give 1 u 10. Acute hypoxemic respiratory failure:  - extubated/stable 11. AKI:  - resolved 12. PAH - likely due to longstanding MV disease - pulmonary pressures elevated in 80-90 range -  continue milrinone  - add sildenafil 20 tid   Length of Stay: 23  Glori Bickers, MD  02/19/2021, 12:22 PM  Advanced Heart Failure Team Pager [phone] (M-F; 7a - 5p)  Please contact [facility] for night-coverage after hours (5p -7a ) and weekends on [email or web address]

## 2021-02-19 NOTE — Plan of Care (Signed)
  Problem: Education: Goal: Knowledge of General Education information will improve Description Including pain rating scale, medication(s)/side effects and non-pharmacologic comfort measures Outcome: Progressing   Problem: Health Behavior/Discharge Planning: Goal: Ability to manage health-related needs will improve Outcome: Progressing   Problem: Clinical Measurements: Goal: Ability to maintain clinical measurements within normal limits will improve Outcome: Progressing Goal: Diagnostic test results will improve Outcome: Progressing Goal: Respiratory complications will improve Outcome: Progressing Goal: Cardiovascular complication will be avoided Outcome: Progressing   Problem: Activity: Goal: Risk for activity intolerance will decrease Outcome: Progressing   Problem: Nutrition: Goal: Adequate nutrition will be maintained Outcome: Progressing   Problem: Coping: Goal: Level of anxiety will decrease Outcome: Progressing   

## 2021-02-19 NOTE — Progress Notes (Signed)
Echocardiogram 2D Echocardiogram has been performed.  Warren Lacy Patton Swisher RDCS 02/19/2021, 10:38 AM

## 2021-02-19 NOTE — Progress Notes (Signed)
[address]       [place] [address]             [phone]                 2 Days Post-Op Procedure(s) (LRB): REDO STERNOTOMY (N/A) MITRAL VALVE (MV) REPLACEMENT USING ON-X 27/29 MM PROSTHETIC MITRAL VALVE (N/A) CORONARY ARTERY BYPASS GRAFTING (CABG), ON PUMP, TIMES ONE, USING ENDOSCOPICALLY HARVESTED RIGHT GREATER SAPHENOUS VEIN (N/A) TRANSESOPHAGEAL ECHOCARDIOGRAM (TEE) (N/A) APPLICATION OF CELL SAVER   Events: No events _______________________________________________________________ Vitals: BP (!) 77/56   Pulse 68   Temp 98.4 F (36.9 C)   Resp 19   Ht 5\' 11"  (1.803 m)   Wt 72.2 kg   SpO2 97%   BMI 22.20 kg/m  Filed Weights   02/17/21 0608 02/18/21 0500 02/19/21 0733  Weight: 70.2 kg 75.6 kg 72.2 kg     - Neuro: arousable, NAD  - Cardiovascular: Sinus  Drips: milr 0.375.   PAP: (52-96)/(9-31) 90/17 CVP:  [4 mmHg-23 mmHg] 11 mmHg CO:  [6.2 L/min-7.1 L/min] 7.1 L/min CI:  [3.3 L/min/m2-3.8 L/min/m2] 3.8 L/min/m2  - Pulm: EWOB   ABG    Component Value Date/Time   PHART 7.357 02/18/2021 0958   PCO2ART 47.9 02/18/2021 0958   PO2ART 175 (H) 02/18/2021 0958   HCO3 26.7 02/18/2021 0958   TCO2 28 02/18/2021 0958   ACIDBASEDEF 3.0 (H) 02/17/2021 1448   O2SAT 74.6 02/19/2021 0514    - Abd: ND - Extremity: cool  .Intake/Output      12/02 0701 12/03 0700 12/03 0701 12/04 0700   P.O. 360    I.V. (mL/kg) 965.1 (12.8)    Blood     NG/GT 80    IV Piggyback 63    Total Intake(mL/kg) 1468.1 (19.4)    Urine (mL/kg/hr) 2470 (1.4)    Chest Tube 236    Total Output 2706    Net -1237.9            _______________________________________________________________ Labs: CBC Latest Ref Rng & Units 02/19/2021 02/18/2021 02/18/2021  WBC 4.0 - 10.5 K/uL 15.9(H) 17.1(H) -  Hemoglobin 13.0 - 17.0 g/dL 7.3(L) 8.0(L) 9.2(L)  Hematocrit 39.0 - 52.0 % 24.3(L) 27.7(L) 27.0(L)  Platelets 150 - 400 K/uL 122(L) 134(L) -   CMP Latest Ref Rng &  Units 02/19/2021 02/18/2021 02/18/2021  Glucose 70 - 99 mg/dL 14/04/2020) - -  BUN 6 - 20 mg/dL 18 - -  Creatinine 703(J - 1.24 mg/dL 0.09 - -  Sodium 3.81 - 145 mmol/L 133(L) - -  Potassium 3.5 - 5.1 mmol/L 4.4 4.3 5.1  Chloride 98 - 111 mmol/L 102 - -  CO2 22 - 32 mmol/L 26 - -  Calcium 8.9 - 10.3 mg/dL 829) - -  Total Protein 6.5 - 8.1 g/dL - - -  Total Bilirubin 0.3 - 1.2 mg/dL - - -  Alkaline Phos 38 - 126 U/L - - -  AST 15 - 41 U/L - - -  ALT 0 - 44 U/L - - -    CXR: clear  _______________________________________________________________  Assessment and Plan: POD 2 s/p redo sternotomy, CABG and mMVR  Neuro: pain controlled CV: on milr 0.375.  not requiring pacing.  A-line and BP not correlating.   Pulm: continue pulm hygiene Renal: creat stable.  diuresing GI: on diet Heme: stable.  On coumadin.  INR pending ID: [email or web address] Endo: SSI Dispo: continue ICU care   9.3(Z 02/19/2021  8:13 AM

## 2021-02-20 ENCOUNTER — Inpatient Hospital Stay (HOSPITAL_COMMUNITY): Payer: Self-pay

## 2021-02-20 DIAGNOSIS — L8990 Pressure ulcer of unspecified site, unspecified stage: Secondary | ICD-10-CM | POA: Insufficient documentation

## 2021-02-20 LAB — TYPE AND SCREEN
ABO/RH(D): A NEG
Antibody Screen: NEGATIVE
Unit division: 0
Unit division: 0
Unit division: 0
Unit division: 0
Unit division: 0
Unit division: 0

## 2021-02-20 LAB — BASIC METABOLIC PANEL
Anion gap: 7 (ref 5–15)
BUN: 15 mg/dL (ref 6–20)
CO2: 27 mmol/L (ref 22–32)
Calcium: 8.3 mg/dL — ABNORMAL LOW (ref 8.9–10.3)
Chloride: 100 mmol/L (ref 98–111)
Creatinine, Ser: 0.76 mg/dL (ref 0.61–1.24)
GFR, Estimated: >60 mL/min (ref >60)
Glucose, Bld: 73 mg/dL (ref 70–99)
Potassium: 4.1 mmol/L (ref 3.5–5.1)
Sodium: 134 mmol/L — ABNORMAL LOW (ref 135–145)

## 2021-02-20 LAB — BPAM RBC
Blood Product Expiration Date: 202212292359
Blood Product Expiration Date: 202212292359
Blood Product Expiration Date: 202212292359
Blood Product Expiration Date: 202212292359
Blood Product Expiration Date: 202212302359
Blood Product Expiration Date: 202212302359
ISSUE DATE / TIME: 202212021239
ISSUE DATE / TIME: 202212021239
ISSUE DATE / TIME: 202212021239
ISSUE DATE / TIME: 202212021239
ISSUE DATE / TIME: 202212031322
Unit Type and Rh: 600
Unit Type and Rh: 600
Unit Type and Rh: 600
Unit Type and Rh: 600
Unit Type and Rh: 600
Unit Type and Rh: 600

## 2021-02-20 LAB — COOXEMETRY PANEL
Carboxyhemoglobin: 2.2 % — ABNORMAL HIGH (ref 0.5–1.5)
Methemoglobin: 1 % (ref 0.0–1.5)
O2 Saturation: 57.7 %
Total hemoglobin: 7.8 g/dL — ABNORMAL LOW (ref 12.0–16.0)

## 2021-02-20 LAB — CBC
HCT: 25.8 % — ABNORMAL LOW (ref 39.0–52.0)
Hemoglobin: 7.7 g/dL — ABNORMAL LOW (ref 13.0–17.0)
MCH: 24.2 pg — ABNORMAL LOW (ref 26.0–34.0)
MCHC: 29.8 g/dL — ABNORMAL LOW (ref 30.0–36.0)
MCV: 81.1 fL (ref 80.0–100.0)
Platelets: 123 10*3/uL — ABNORMAL LOW (ref 150–400)
RBC: 3.18 MIL/uL — ABNORMAL LOW (ref 4.22–5.81)
RDW: 29.3 % — ABNORMAL HIGH (ref 11.5–15.5)
WBC: 14.1 10*3/uL — ABNORMAL HIGH (ref 4.0–10.5)
nRBC: 0 % (ref 0.0–0.2)

## 2021-02-20 LAB — GLUCOSE, CAPILLARY
Glucose-Capillary: 190 mg/dL — ABNORMAL HIGH (ref 70–99)
Glucose-Capillary: 91 mg/dL (ref 70–99)
Glucose-Capillary: 97 mg/dL (ref 70–99)
Glucose-Capillary: 87 mg/dL (ref 70–99)

## 2021-02-20 LAB — PROTIME-INR
INR: 1.5 — ABNORMAL HIGH (ref 0.8–1.2)
Prothrombin Time: 18.3 seconds — ABNORMAL HIGH (ref 11.4–15.2)

## 2021-02-20 MED ORDER — WARFARIN SODIUM 2.5 MG PO TABS
2.5000 mg | ORAL_TABLET | Freq: Every day | ORAL | Status: AC
  Administered 2021-02-20: 15:00:00 2.5 mg via ORAL
  Filled 2021-02-20: qty 1

## 2021-02-20 MED ORDER — SPIRONOLACTONE 12.5 MG HALF TABLET
12.5000 mg | ORAL_TABLET | Freq: Every day | ORAL | Status: DC
  Administered 2021-02-20 – 2021-02-21 (×2): 12.5 mg via ORAL
  Filled 2021-02-20 (×2): qty 1

## 2021-02-20 MED ORDER — SORBITOL 70 % SOLN
45.0000 mL | Freq: Once | Status: DC
  Filled 2021-02-20: qty 60

## 2021-02-20 MED ORDER — WARFARIN SODIUM 2.5 MG PO TABS: 2.5000 mg | ORAL_TABLET | Freq: Every day | ORAL | Status: DC

## 2021-02-20 MED ORDER — SILDENAFIL CITRATE 20 MG PO TABS
40.0000 mg | ORAL_TABLET | Freq: Three times a day (TID) | ORAL | Status: DC
  Administered 2021-02-20 – 2021-03-01 (×27): 40 mg via ORAL
  Filled 2021-02-20 (×27): qty 2

## 2021-02-20 NOTE — Progress Notes (Signed)
Patient ID: Mark Garrett, male   DOB: [birth year], 40 y.o.   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  Extensive vegetation noted on excised valve. PA pressures 90-100 in OR  Remains on milrinone 0.375. Sildenafil started 12/3 for PAH. PAPs down 90-70.   Sitting in chair. Denies SOB, orthopnea or PND. Rhythm stable. Passing gas. No BM yet.   Echo 12/3: EF 25-30% Mod dec RV function. MVR ok Personally reviewed   Swan:  PAP: (65-90)/(11-30) 74/13 CVP:  [3 mmHg-18 mmHg] 5 mmHg CO:  [5 L/min-8 L/min] 8 L/min CI:  [2.7 L/min/m2-4.2 L/min/m2] 4.2 L/min/m2  PCWP 12  TEE (11/16): EF 35-40%, there was mobile vegetation on both the anterior and posterior mitral leaflets.  The leaflets did not completely coapt (?destruction from endocarditis) with severe mitral regurgitation (ERO 0.56 cm^2).  There was extensive plaque in the proximal descending thoracic aorta with what appears to be mobile vegetation attached to it.   LHC/RHC:   Coronary Findings  Diagnostic Dominance: Right Left Anterior Descending  Mid LAD lesion is 95% stenosed.  Ramus Intermedius  Ramus-1 lesion is 50% stenosed.  Ramus-2 lesion is 80% stenosed.  Right Coronary Artery  Mid RCA lesion is 30% stenosed.  Graft To PLV  Origin lesion is 100% stenosed.  LIMA Graft To Dist LAD  Intervention  No interventions have been documented. Right Heart  Right Heart Pressures RHC Procedural Findings: Hemodynamics (mmHg) on milrinone 0.375 RA mean 18 RV 69/20 PA 76/25, mean 44 PCWP mean 22, v-waves to 37 mmHg LV 100/23 AO 100/60  Oxygen saturations: PA 58% AO 97%  Cardiac Output (Fick) 5.33  Cardiac Index (Fick) 2.67 PVR 4.1 WU  Cardiac Output (Thermo) 4.89 Cardiac Index (Thermo) 2.45  PVR 4.5 WU  PAPI: 2.8    Objective:   Weight Range: 74.7 kg Body mass index is 22.98 kg/m.    Vital Signs:   Temp:  [97.5 F (36.4 C)-99.1 F (37.3 C)] 98.6 F (37 C) (12/04 0800) Pulse Rate:  [63-77] 70 (12/04 0800) Resp:  [11-28] 21 (12/04 0800) BP: (84-98)/(62-68) 84/67 (12/03 1500) SpO2:  [87 %-100 %] 99 % (12/04 0834) Arterial Line BP: (101-155)/(45-70) 124/46 (12/04 0800) Weight:  [74.7 kg] 74.7 kg (12/04 0358) Last BM Date: 03/18/21  Weight change: Filed Weights   02/18/21 0500 02/19/21 0733 02/20/21 0358  Weight: 75.6 kg 72.2 kg 74.7 kg    Intake/Output:   Intake/Output Summary (Last 24 hours) at 02/20/2021 1124 Last data filed at 02/20/2021 0900 Gross per 24 hour  Intake 2949.48 ml  Output 1255 ml  Net 1694.48 ml       Physical Exam   General:  Sitting up in chair No resp difficulty HEENT: normal Neck: supple. RIJ swan. Carotids 2+ bilat; no bruits. No lymphadenopathy or thryomegaly appreciated. Cor: Sternal dressing ok. + CTs  PMI nondisplaced. Regular rate & rhythm. No rubs, gallops or murmurs. Lungs: clear Abdomen: soft, nontender, + distended. Hypoactive  bowel sounds. Extremities: no cyanosis, clubbing, rash, edema + L foot amp Neuro: alert & orientedx3, cranial nerves grossly intact. moves all 4 extremities w/o difficulty. Affect pleasant   Telemetry   SR 70s (personally reviewed)   Labs    CBC Recent Labs    02/19/21 0351 02/20/21 0330  WBC 15.9* 14.1*  HGB 7.3* 7.7*  HCT 24.3* 25.8*  MCV 79.7*  81.1  PLT 122* 123*    Basic Metabolic Panel Recent Labs    02/18/21 0158 02/18/21 0312 02/18/21 1721 02/18/21 2137 02/19/21 0351 02/20/21 0330  NA 134*   < >  --   --  133* 134*  K 5.7*   < > 5.1   < > 4.4 4.1  CL 102   < >  --   --  102 100  CO2 24   < >  --   --  26 27  GLUCOSE 127*   < >  --   --  119* 73  BUN 24*   < >  --   --  18 15  CREATININE 1.27*   < >  --   --  0.96 0.76  CALCIUM 8.5*   < >  --   --  8.4* 8.3*  MG 3.0*  --  2.1  --   --   --    < > = values in this interval not displayed.    Liver Function  Tests No results for input(s): AST, ALT, ALKPHOS, BILITOT, PROT, ALBUMIN in the last 72 hours.  No results for input(s): LIPASE, AMYLASE in the last 72 hours. Cardiac Enzymes No results for input(s): CKTOTAL, CKMB, CKMBINDEX, TROPONINI in the last 72 hours.   BNP: BNP (last 3 results) Recent Labs    01/27/21 1034  BNP 1,733.0*     ProBNP (last 3 results) No results for input(s): PROBNP in the last 8760 hours.   D-Dimer No results for input(s): DDIMER in the last 72 hours. Hemoglobin A1C No results for input(s): HGBA1C in the last 72 hours.  Fasting Lipid Panel No results for input(s): CHOL, HDL, LDLCALC, TRIG, CHOLHDL, LDLDIRECT in the last 72 hours.  Thyroid Function Tests No results for input(s): TSH, T4TOTAL, T3FREE, THYROIDAB in the last 72 hours.  Invalid input(s): FREET3  Other results:   Imaging    DG Chest Port 1 View  Result Date: 02/20/2021 CLINICAL DATA:  Pleural effusion EXAM: PORTABLE CHEST 1 VIEW COMPARISON:  Chest x-rays dated 02/19/2021 and 02/18/2021. Chest CT dated 01/27/2021. FINDINGS: Stable cardiomegaly. Swan-Ganz catheter is stable in position with tip at the midline. Bilateral chest tubes have been removed. Presumed additional chest tube overlying the lower mediastinum. Mediastinal drain projects to the upper mediastinum. No pneumothorax is seen bilaterally. Persistent opacity at the lateral aspects of the RIGHT lower lung. Lungs otherwise clear. IMPRESSION: 1. Interval removal of bilateral chest tubes. No pneumothorax seen bilaterally. 2. Stable cardiomegaly. 3. Persistent opacity at the lateral aspects of the RIGHT lower lung, compatible with small pleural effusion and/or residual pneumonia. Electronically Signed   By: Franki Cabot M.D.   On: 02/20/2021 08:20     Medications:     Scheduled Medications:  acetaminophen  1,000 mg Oral Q6H   Or   acetaminophen (TYLENOL) oral liquid 160 mg/5 mL  1,000 mg Per Tube Q6H   albuterol  2.5 mg  Nebulization BID   aspirin EC  325 mg Oral Daily   bisacodyl  10 mg Oral Daily   Or   bisacodyl  10 mg Rectal Daily   chlorhexidine gluconate (MEDLINE KIT)  15 mL Mouth Rinse BID   Chlorhexidine Gluconate Cloth  6 each Topical Daily   docusate sodium  200 mg Oral Daily   enoxaparin (LOVENOX) injection  40 mg Subcutaneous QHS   feeding supplement  237 mL Oral TID BM   insulin aspart  0-15 Units Subcutaneous TID WC  mouth rinse  15 mL Mouth Rinse QID   pantoprazole  40 mg Oral Daily   rosuvastatin  40 mg Oral Daily   sildenafil  20 mg Oral TID   sodium chloride flush  3 mL Intravenous Q12H   sorbitol  45 mL Oral Once   Warfarin - Physician Dosing Inpatient   Does not apply q1600    Infusions:  sodium chloride 20 mL/hr at 02/20/21 0900   sodium chloride     sodium chloride 10 mL/hr at 02/17/21 1435   cefTRIAXone (ROCEPHIN)  IV Stopped (02/19/21 1553)   DAPTOmycin (CUBICIN)  IV Stopped (02/19/21 2030)   lactated ringers     lactated ringers 20 mL/hr at 02/17/21 1435   lactated ringers 20 mL/hr at 02/20/21 0900   milrinone 0.375 mcg/kg/min (02/20/21 0905)    PRN Medications: sodium chloride, hydrALAZINE, lactated ringers, midazolam, morphine injection, ondansetron (ZOFRAN) IV, oxyCODONE, sodium chloride flush, traMADol    Assessment/Plan   1. Acute systolic CHF: Ischemic cardiomyopathy, symptoms worsened by severe MR.  Echo with EF 35-40%, severe hypokinesis inferior and inferolateral walls, moderate LV dilation with mild LVH, mild RV dilation with moderately decreased systolic function, restricted posterior mitral leaflet and calcified mitral valve with severe mitral regurgitation and at least mild mitral stenosis (mean gradient 8 mmHg), PASP 60. Per report, echo at [facility] showed EF 55-60% with severe MR in 10/22.  It was a technically difficult study per report.  It is possible that he had a new ACS event prior to admission with reported drop in EF though HS-TnI with mild  elevation and no trend suggests that it was not immediately prior to admission.  Low output noted with co-ox 39% initially, lactate elevated when he was initially admitted.  Milrinone begun with stabilization.  Now s/p mechanical MVR and SVG-ramus.  Cardiac output looks good. PA pressures coming down. Post-op echo 12/3 EF 25-30% RV moderately down. MVR ok - decrease milrinone to 0.25 - increase sildenafil to 40 tid - add spiro - titrate GDMT as tolerated - call pull swan and art line - mobilize with PT. Sorbitol x 1 - D/w Dr. Kipp Brood at bedside 2. CAD: S/p CABG 2006.  As above, with fall in EF and CHF exacerbation, possible ACS prior to admission.  However, mild HS-TnI elevation with no trend suggests that ACS was not immediately prior to admission.  Current elevation is likely demand ischemia from volume overload. LHC on 11/14 showed patent LIMA-LAD with SVG occluded at aorta (does not look new); there was complex 80% proximal ramus stenosis.  Initially assumed that the SVG was to the ramus, but subsequently got the CABG operative report from [place] and it looks like it was an SVG-PLV.  There was minimal native RCA disease. LAD territory well-supplied by LIMA. Now s/p SVG-ramus on 12/1. No s/s angina - ASA  - Crestor 40 3.  Mitral valve disease: Initial echo showed posterior MV leaflet restricted with severe MR, suspected primarily infarct-related MR given LV dilation and inferior/inferolateral severe hypokinesis.  However, TEE on 11/16 showed vegetation (not bulky but clearly present) on the posterior and anterior leaflets with poor leaflet coaptation, suggesting endocarditis may be the major cause of severe MR.  Reviewed with ID, suspect the mitral vegetations have been present for a while (severe MR on 10/22 echo as well, does not appear to have active infection).  Cannot rule out nonbacterial thrombotic endocarditis (had recent DVT also), but somewhat academic at this point as he will be on  anticoagulation for the non-triggered DVT and we are going to treat with antibiotic course given presentation and high WBCs. ANA negative. Extensive vegetation noted on MV at time of MVR, sent for cultures.  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   - Warfarin started per TCTS 4. 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  ?due to sedentary lifestyle + ?genetic predisposition.  He is not a Xarelto failure, had DVT found in 10/22 with Xarelto started at this point, 11/22 dopplers showed partially treated DVTs.  - He will be anticoagulated with mechanical valve.  5. Smoking: Needs to quit.  6. PNA: Cultures 11/18 NGTD.  RML PNA on CT chest.  - He completed course of abx initially for PNA.  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 He is able to walk on the stump.  - PT following, continue to mobilize => walk daily in hall.  8. 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  There is also mobile vegetation that appears adherent to plaque in the proximal descending thoracic aorta.  CT head showed no evidence for embolic disease.  Possible source would be due to poor dentition. Blood cultures from 11/10 and 11/16 NGTD.  He was initially on abx for PNA.  We re-drew cultures on 11/16 and 11/24, all negative so far.  ESR is 1, CRP 1.7.  He had been already partially treated when we found the endocarditis (abx for PNA) and suspect that the endocarditis is old as he had severe MR also on echo in 10/22. ID following.  WBCs rose to >30 after teeth removed but now coming back down (13 today ).  Suspect worsening leukocytosis was a response to dental surgery. Extensive vegetation noted on MV at time of surgery, sample sent for culture. WBC coming down. Tmax 99.5 - Per ID, continue 6 wks of IV abx to 12/27 =>ceftriaxone and daptomycin.    9. Microcytic anemia: Post-op drop, hgb 7.3.  - Iron stores low, had Feraheme  - Rec'd 1u RBC on 12/3 hgb 7.3 -> 7.7. Follow.  No active bleeding - Transfuse hgb < 8. 10. Acute hypoxemic respiratory failure:  - extubated/stable 11. AKI:  - resolved 12. PAH - likely due to longstanding MV disease - pulmonary pressures elevated in 80-90 range initially. Now 70s - Decrease milrinone to 0.25 - increase sildenafil to 40  tid 13. Probable R subclavian stenosis - R arm BP 30-40 points lower than left. - measure BP in left arm   Length of Stay: 24  Glori Bickers, MD  02/20/2021, 11:24 AM  Advanced Heart Failure Team Pager [phone] (M-F; 7a - 5p)  Please contact [facility] for night-coverage after hours (5p -7a ) and weekends on [email or web address]

## 2021-02-20 NOTE — Progress Notes (Signed)
EVENING ROUNDS NOTE :     [address]       [place] [address]             [phone]                 3 Days Post-Op Procedure(s) (LRB): REDO STERNOTOMY (N/A) MITRAL VALVE (MV) REPLACEMENT USING ON-X 27/29 MM PROSTHETIC MITRAL VALVE (N/A) CORONARY ARTERY BYPASS GRAFTING (CABG), ON PUMP, TIMES ONE, USING ENDOSCOPICALLY HARVESTED RIGHT GREATER SAPHENOUS VEIN (N/A) TRANSESOPHAGEAL ECHOCARDIOGRAM (TEE) (N/A) APPLICATION OF CELL SAVER   Total Length of Stay:  LOS: 24 days  Events:   No events     BP 118/70   Pulse 73   Temp 98.6 F (37 C) (Oral)   Resp 17   Ht 5\' 11"  (1.803 m)   Wt 74.7 kg   SpO2 92%   BMI 22.98 kg/m   PAP: (65-90)/(11-30) 74/16 CVP:  [3 mmHg-18 mmHg] 7 mmHg CO:  [6.2 L/min-8 L/min] 8 L/min CI:  [3.3 L/min/m2-4.2 L/min/m2] 4.2 L/min/m2      sodium chloride Stopped (02/20/21 1127)   sodium chloride     sodium chloride 10 mL/hr at 02/17/21 1435   cefTRIAXone (ROCEPHIN)  IV 200 mL/hr at 02/20/21 1700   DAPTOmycin (CUBICIN)  IV Stopped (02/19/21 2030)   lactated ringers     lactated ringers 20 mL/hr at 02/17/21 1435   lactated ringers 20 mL/hr at 02/20/21 1700   milrinone 0.25 mcg/kg/min (02/20/21 1700)    I/O last 3 completed shifts: In: 3915.8 [P.O.:1674; I.V.:1573.3; Blood:442.5; IV Piggyback:226] Out: 2240 [Urine:1905; Chest Tube:335]   CBC Latest Ref Rng & Units 02/20/2021 02/19/2021 02/18/2021  WBC 4.0 - 10.5 K/uL 14.1(H) 15.9(H) 17.1(H)  Hemoglobin 13.0 - 17.0 g/dL 7.7(L) 7.3(L) 8.0(L)  Hematocrit 39.0 - 52.0 % 25.8(L) 24.3(L) 27.7(L)  Platelets 150 - 400 K/uL 123(L) 122(L) 134(L)    BMP Latest Ref Rng & Units 02/20/2021 02/19/2021 02/18/2021  Glucose 70 - 99 mg/dL 73 14/04/2020) -  BUN 6 - 20 mg/dL 15 18 -  Creatinine 419(F - 1.24 mg/dL 7.90 2.40 -  Sodium 9.73 - 145 mmol/L 134(L) 133(L) -  Potassium 3.5 - 5.1 mmol/L 4.1 4.4 4.3  Chloride 98 - 111 mmol/L 100 102 -  CO2 22 - 32 mmol/L 27 26 -  Calcium 8.9 - 10.3 mg/dL  8.3(L) 8.4(L) -    ABG    Component Value Date/Time   PHART 7.357 02/18/2021 0958   PCO2ART 47.9 02/18/2021 0958   PO2ART 175 (H) 02/18/2021 0958   HCO3 26.7 02/18/2021 0958   TCO2 28 02/18/2021 0958   ACIDBASEDEF 3.0 (H) 02/17/2021 1448   O2SAT 57.7 02/20/2021 0334       14/06/2020, MD 02/20/2021 5:27 PM

## 2021-02-20 NOTE — Progress Notes (Signed)
[address]       [place] [address]             [phone]                 3 Days Post-Op Procedure(s) (LRB): REDO STERNOTOMY (N/A) MITRAL VALVE (MV) REPLACEMENT USING ON-X 27/29 MM PROSTHETIC MITRAL VALVE (N/A) CORONARY ARTERY BYPASS GRAFTING (CABG), ON PUMP, TIMES ONE, USING ENDOSCOPICALLY HARVESTED RIGHT GREATER SAPHENOUS VEIN (N/A) TRANSESOPHAGEAL ECHOCARDIOGRAM (TEE) (N/A) APPLICATION OF CELL SAVER   Events: No events Received 1 unit of blood _______________________________________________________________ Vitals: BP (!) 84/67 Comment: cuff does not correlate with aline  Pulse 70   Temp 98.6 F (37 C)   Resp (!) 21   Ht 5\' 11"  (1.803 m)   Wt 74.7 kg   SpO2 99%   BMI 22.98 kg/m  Filed Weights   02/18/21 0500 02/19/21 0733 02/20/21 0358  Weight: 75.6 kg 72.2 kg 74.7 kg     - Neuro: arousable, NAD  - Cardiovascular: Sinus  Drips: milr 0.375.   PAP: (65-90)/(11-30) 74/13 CVP:  [3 mmHg-18 mmHg] 5 mmHg CO:  [5 L/min-8 L/min] 8 L/min CI:  [2.7 L/min/m2-4.2 L/min/m2] 4.2 L/min/m2  - Pulm: EWOB   ABG    Component Value Date/Time   PHART 7.357 02/18/2021 0958   PCO2ART 47.9 02/18/2021 0958   PO2ART 175 (H) 02/18/2021 0958   HCO3 26.7 02/18/2021 0958   TCO2 28 02/18/2021 0958   ACIDBASEDEF 3.0 (H) 02/17/2021 1448   O2SAT 57.7 02/20/2021 0334    - Abd: ND - Extremity: cool  .Intake/Output      12/03 0701 12/04 0700 12/04 0701 12/05 0700   P.O. 1314 360   I.V. (mL/kg) 1113.7 (14.9) 96 (1.3)   Blood 442.5    NG/GT     IV Piggyback 163    Total Intake(mL/kg) 3033.2 (40.6) 456 (6.1)   Urine (mL/kg/hr) 1270 (0.7) 85 (0.3)   Chest Tube 240    Total Output 1510 85   Net +1523.2 +371           _______________________________________________________________ Labs: CBC Latest Ref Rng & Units 02/20/2021 02/19/2021 02/18/2021  WBC 4.0 - 10.5 K/uL 14.1(H) 15.9(H) 17.1(H)  Hemoglobin 13.0 - 17.0 g/dL 7.7(L) 7.3(L) 8.0(L)   Hematocrit 39.0 - 52.0 % 25.8(L) 24.3(L) 27.7(L)  Platelets 150 - 400 K/uL 123(L) 122(L) 134(L)   CMP Latest Ref Rng & Units 02/20/2021 02/19/2021 02/18/2021  Glucose 70 - 99 mg/dL 73 119(H) -  BUN 6 - 20 mg/dL 15 18 -  Creatinine 0.61 - 1.24 mg/dL 0.76 0.96 -  Sodium 135 - 145 mmol/L 134(L) 133(L) -  Potassium 3.5 - 5.1 mmol/L 4.1 4.4 4.3  Chloride 98 - 111 mmol/L 100 102 -  CO2 22 - 32 mmol/L 27 26 -  Calcium 8.9 - 10.3 mg/dL 8.3(L) 8.4(L) -  Total Protein 6.5 - 8.1 g/dL - - -  Total Bilirubin 0.3 - 1.2 mg/dL - - -  Alkaline Phos 38 - 126 U/L - - -  AST 15 - 41 U/L - - -  ALT 0 - 44 U/L - - -    CXR: clear  _______________________________________________________________  Assessment and Plan: POD 2 s/p redo sternotomy, CABG and mMVR  Neuro: pain controlled CV: on milr 0.375, will wean today.  not requiring pacing.  On viagra for pulm HTN.   Pulm: continue pulm hygiene Renal: creat stable.  diuresing GI: on diet Heme: stable.  On  coumadin.  INR 1.5 ID: [email or web address] Endo: SSI Dispo: continue ICU care   Mark Garrett 02/20/2021 10:29 AM

## 2021-02-21 ENCOUNTER — Other Ambulatory Visit (HOSPITAL_COMMUNITY): Payer: Self-pay

## 2021-02-21 ENCOUNTER — Inpatient Hospital Stay: Payer: Self-pay

## 2021-02-21 LAB — BASIC METABOLIC PANEL
Anion gap: 6 (ref 5–15)
BUN: 13 mg/dL (ref 6–20)
CO2: 25 mmol/L (ref 22–32)
Calcium: 8.3 mg/dL — ABNORMAL LOW (ref 8.9–10.3)
Chloride: 103 mmol/L (ref 98–111)
Creatinine, Ser: 0.91 mg/dL (ref 0.61–1.24)
GFR, Estimated: >60 mL/min (ref >60)
Glucose, Bld: 76 mg/dL (ref 70–99)
Potassium: 3.8 mmol/L (ref 3.5–5.1)
Sodium: 134 mmol/L — ABNORMAL LOW (ref 135–145)

## 2021-02-21 LAB — COOXEMETRY PANEL
Carboxyhemoglobin: 1.8 % — ABNORMAL HIGH (ref 0.5–1.5)
Methemoglobin: 0.8 % (ref 0.0–1.5)
O2 Saturation: 63 %
Total hemoglobin: 8.2 g/dL — ABNORMAL LOW (ref 12.0–16.0)

## 2021-02-21 LAB — PROTIME-INR
INR: 2.2 — ABNORMAL HIGH (ref 0.8–1.2)
Prothrombin Time: 24 seconds — ABNORMAL HIGH (ref 11.4–15.2)

## 2021-02-21 LAB — CBC
HCT: 26.7 % — ABNORMAL LOW (ref 39.0–52.0)
Hemoglobin: 8.1 g/dL — ABNORMAL LOW (ref 13.0–17.0)
MCH: 24.8 pg — ABNORMAL LOW (ref 26.0–34.0)
MCHC: 30.3 g/dL (ref 30.0–36.0)
MCV: 81.7 fL (ref 80.0–100.0)
Platelets: 153 10*3/uL (ref 150–400)
RBC: 3.27 MIL/uL — ABNORMAL LOW (ref 4.22–5.81)
RDW: 29.3 % — ABNORMAL HIGH (ref 11.5–15.5)
WBC: 13.2 10*3/uL — ABNORMAL HIGH (ref 4.0–10.5)
nRBC: 0.2 % (ref 0.0–0.2)

## 2021-02-21 LAB — GLUCOSE, CAPILLARY: Glucose-Capillary: 81 mg/dL (ref 70–99)

## 2021-02-21 LAB — CK: Total CK: 56 U/L (ref 49–397)

## 2021-02-21 MED ORDER — SACUBITRIL-VALSARTAN 24-26 MG PO TABS
1.0000 | ORAL_TABLET | Freq: Two times a day (BID) | ORAL | Status: DC
  Administered 2021-02-21 – 2021-02-22 (×2): 1 via ORAL
  Filled 2021-02-21 (×5): qty 1

## 2021-02-21 MED ORDER — WARFARIN SODIUM 2 MG PO TABS
2.0000 mg | ORAL_TABLET | Freq: Every day | ORAL | Status: DC
  Administered 2021-02-21: 2 mg via ORAL
  Filled 2021-02-21: qty 1

## 2021-02-21 MED ORDER — POTASSIUM CHLORIDE CRYS ER 20 MEQ PO TBCR
40.0000 meq | EXTENDED_RELEASE_TABLET | Freq: Two times a day (BID) | ORAL | Status: AC
  Administered 2021-02-21 (×2): 40 meq via ORAL
  Filled 2021-02-21 (×2): qty 2

## 2021-02-21 MED ORDER — SODIUM CHLORIDE 0.9% FLUSH
10.0000 mL | Freq: Two times a day (BID) | INTRAVENOUS | Status: DC
  Administered 2021-02-21 – 2021-02-24 (×5): 10 mL
  Administered 2021-02-25: 40 mL
  Administered 2021-02-25: 10 mL
  Administered 2021-02-26: 30 mL
  Administered 2021-02-26 – 2021-02-27 (×2): 10 mL
  Administered 2021-02-27: 20 mL
  Administered 2021-02-28 – 2021-03-01 (×3): 10 mL

## 2021-02-21 MED ORDER — FUROSEMIDE 10 MG/ML IJ SOLN
80.0000 mg | Freq: Two times a day (BID) | INTRAMUSCULAR | Status: DC
  Administered 2021-02-21: 80 mg via INTRAVENOUS
  Filled 2021-02-21: qty 8

## 2021-02-21 MED ORDER — SODIUM CHLORIDE 0.9% FLUSH: 10.0000 mL | INTRAVENOUS | Status: DC | PRN

## 2021-02-21 MED ORDER — ASPIRIN 81 MG PO CHEW
81.0000 mg | CHEWABLE_TABLET | Freq: Every day | ORAL | Status: DC
  Administered 2021-02-21 – 2021-03-01 (×9): 81 mg via ORAL
  Filled 2021-02-21 (×9): qty 1

## 2021-02-21 NOTE — Progress Notes (Signed)
CARDIAC REHAB PHASE I   Offered to walk with pt. Pt agreeable. Pr walk systolic BP 60-70s. Pt asymptomatic, but RN unavailable to to see if this is normal for the pt. Increased frequency of BP setting. Will continue to follow.  Reynold Bowen, RN BSN 02/21/2021 2:18 PM

## 2021-02-21 NOTE — Progress Notes (Signed)
[facility]  Date of Admission:  01/27/2021   Total days of inpatient antibiotics 11  Principal Problem:   Acute systolic CHF (congestive heart failure) (HCC) Active Problems:   CAP (community acquired pneumonia)   Pleural effusion on right   Leukocytosis   Microcytic anemia   NSTEMI (non-ST elevated myocardial infarction) (HCC)   DVT (deep venous thrombosis) (HCC)   Cigarette smoker   COPD (chronic obstructive pulmonary disease) (HCC)   Asthma   Severe mitral regurgitation   Serum total bilirubin elevated   Paroxysmal VT   Cardiogenic shock ([place])   Endocarditis of mitral valve   Long term current use of anticoagulant therapy   Encounter for preoperative dental examination   [place]   Retained tooth root   Chronic apical periodontitis   Accretions on teeth   Teeth missing   Excessive dental attrition   Chronic periodontitis   Protein-calorie malnutrition, severe   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HCC)   S/P MVR (mitral valve replacement)   S/P CABG x 1   Pressure injury of skin      40 year old male with COPD/asthma, CAD status post CABG 2006, DVT presented for shortness of breath and chest pain.  Shortness of breath started about 3 days prior to arrival.  Chest x-ray showed patchy airspace opacity at the right lung.  CT showed right middle lobe and small bilateral lower lobe opacity concerning for pneumonia.  He was initially started on Zithromax + ceftriaxone which was transitioned to Augmentin.  Bilateral lower extremity and Doppler shows age indeterminant DVT.  TEE revealed mobile vegetation about anterior and posterior mitral valve leaflets with severe mitral regurg.  He reports losing over 100 pounds over the past several months due to lack of access to  nutrition assistance.  Assessment: # Culture negative native valve mitral valve vegetation SP MVR on 12/1 with negative Cx #Possible emboli to the lungs -Infectious  work/up has been negative: -HIV (VL<20)and RPR negative -ANA reflex negative -Brucella IgG and IgM, legionella PCR an ASO negative. Bartonella, blood/afb/fungal Cx NGTD. -Trialed 2 days off of antibiotics 11/18-19 but leukocytosis trended up Continued broad spectrum antibiotics 12/20-p. Switched from cefepime + vancomycin->ceftriaxone+ daptomycin to avoid nephrotoxicity -Developed cardiogenic shock requiring milrinone. -SP MVR with aerobic/anaerobic,afb/fungal Cx negative so far. [place] PCR added Recommendations: -Added on  [place] PCR, bacterial, fungal and afb Cx -Will plan to treat with 6 weeks of IV antibiotics empirically for Cx negative IE from(ceftriaxone and daptomycin) OR(02/17/21-03/30/21). -Follow OR cx to completion -I would not recommend sending him home with a PICC line as concern that he would not be able to manage it. He reported that he is agreeable to finishing antibiotics at a facility.    Microbiology:   Antibiotics:  Azithromycin 11/10 Ceftriaxone 11/10,11/28-p Augmentin 11/11-11/15 Cefepime 11/16-17, 20-11/27 Vancomycin 11/16-17, 20/11/24, Daptomycin 11/25-p  SUBJECTIVE: Pt is resting in bed. No new complaints, no significant overnight evnets.   Review of Systems: Review of Systems  All other systems reviewed and are negative.   Scheduled Meds:  acetaminophen  1,000 mg Oral Q6H   Or   acetaminophen (TYLENOL) oral liquid 160 mg/5 mL  1,000 mg Per Tube Q6H   albuterol  2.5 mg Nebulization BID   aspirin  81 mg Oral Daily   bisacodyl  10 mg Oral Daily   Or   bisacodyl  10 mg Rectal Daily   chlorhexidine gluconate (MEDLINE KIT)  15 mL Mouth Rinse  BID   Chlorhexidine Gluconate Cloth  6 each Topical Daily   docusate sodium  200 mg Oral Daily   feeding supplement  237 mL Oral TID BM   furosemide  80 mg Intravenous BID   mouth rinse  15 mL Mouth Rinse QID   pantoprazole  40 mg Oral Daily   potassium chloride  40 mEq Oral BID   rosuvastatin  40 mg  Oral Daily   sacubitril-valsartan  1 tablet Oral BID   sildenafil  40 mg Oral TID   sodium chloride flush  10-40 mL Intracatheter Q12H   sodium chloride flush  3 mL Intravenous Q12H   sorbitol  45 mL Oral Once   spironolactone  12.5 mg Oral Daily   warfarin  2 mg Oral Daily   Warfarin - Physician Dosing Inpatient   Does not apply q1600   Continuous Infusions:  sodium chloride Stopped (02/20/21 1127)   sodium chloride     sodium chloride 10 mL/hr at 02/17/21 1435   cefTRIAXone (ROCEPHIN)  IV Stopped (02/20/21 1732)   DAPTOmycin (CUBICIN)  IV Stopped (02/21/21 0012)   lactated ringers 20 mL/hr at 02/17/21 1435   lactated ringers Stopped (02/21/21 0741)   milrinone 0.125 mcg/kg/min (02/21/21 0800)   PRN Meds:.sodium chloride, hydrALAZINE, morphine injection, ondansetron (ZOFRAN) IV, oxyCODONE, sodium chloride flush, sodium chloride flush, traMADol Allergies  Allergen Reactions   Iodide Rash    OBJECTIVE: Vitals:   02/21/21 0500 02/21/21 0600 02/21/21 0700 02/21/21 0800  BP: (!) 153/87 (!) 154/88 (!) 150/84 (!) 168/83  Pulse: 75 70 69 69  Resp: (!) 21 16 20 17   Temp:    97.8 F (36.6 C)  TempSrc:    Oral  SpO2: 95% 98% 96% 97%  Weight: 74.5 kg     Height:       Body mass index is 22.91 kg/m.  Physical Exam Constitutional:      General: He is not in acute distress.    Appearance: He is normal weight. He is not toxic-appearing.  HENT:     Head: Normocephalic and atraumatic.     Right Ear: External ear normal.     Left Ear: External ear normal.     Nose: No congestion or rhinorrhea.     Mouth/Throat:     Mouth: Mucous membranes are moist.     Pharynx: Oropharynx is clear.  Eyes:     Extraocular Movements: Extraocular movements intact.     Conjunctiva/sclera: Conjunctivae normal.     Pupils: Pupils are equal, round, and reactive to light.  Cardiovascular:     Rate and Rhythm: Normal rate and regular rhythm.     Heart sounds: No murmur heard.   No friction rub. No  gallop.     Comments: Midline scar Pulmonary:     Effort: Pulmonary effort is normal.     Breath sounds: Normal breath sounds.  Abdominal:     General: Abdomen is flat. Bowel sounds are normal.     Palpations: Abdomen is soft.  Musculoskeletal:        General: No swelling. Normal range of motion.     Cervical back: Normal range of motion and neck supple.  Skin:    General: Skin is warm and dry.  Neurological:     General: No focal deficit present.     Mental Status: He is oriented to person, place, and time.  Psychiatric:        Mood and Affect: Mood normal.  Lab Results Lab Results  Component Value Date   WBC 13.2 (H) 02/21/2021   HGB 8.1 (L) 02/21/2021   HCT 26.7 (L) 02/21/2021   MCV 81.7 02/21/2021   PLT 153 02/21/2021    Lab Results  Component Value Date   CREATININE 0.91 02/21/2021   BUN 13 02/21/2021   NA 134 (L) 02/21/2021   K 3.8 02/21/2021   CL 103 02/21/2021   CO2 25 02/21/2021    Lab Results  Component Value Date   ALT 29 02/01/2021   AST 27 02/01/2021   ALKPHOS 88 02/01/2021   BILITOT 2.9 (H) 02/01/2021        Laurice Record, [facility] 02/21/2021, 11:28 AM

## 2021-02-21 NOTE — Discharge Summary (Signed)
Physician Discharge Summary       [address]       [place] [address]             [phone]    Patient ID: Mark Garrett MRN: [id number] DOB/AGE: [birth year] 40 y.o.  Admit date: 01/27/2021 Discharge date: 03/01/2021  Admission Diagnoses: NSTEMI (non-ST elevated myocardial infarction) ([place]) 2. Coronary artery disease 3. 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 Acute systolic CHF (congestive heart failure) ([place]) 3. Expected post op blood loss anemia 4. History of the following:   CAP (community acquired pneumonia)     DVT (deep venous thrombosis) (HCC)   Cigarette smoker   COPD (chronic obstructive pulmonary disease) (HCC)   Asthma   Severe mitral regurgitation   Serum total bilirubin elevated   Paroxysmal VT   Cardiogenic shock (HCC)   Endocarditis of mitral valve   Long term current use of anticoagulant therapy   Encounter for preoperative dental examination   [place]   Retained tooth root   Chronic apical periodontitis   Accretions on teeth   Teeth missing   Excessive dental attrition   Chronic periodontitis   Protein-calorie malnutrition, severe   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HCC)    Pressure injury of skin   Consults:  Advanced heart failure  Procedure (s):  Redo median sternotomy for redo CABG x 1 and mitral valve replacement (using an On X mechanical valve) by Dr. Caffie Pinto on 02/17/2021.  History of Presenting Illness:  The patient is a 40 year old gentleman with a history of coronary disease status post CABG in [place] in 2006, ischemic cardiomyopathy with severe mitral regurgitation felt to be due to endocarditis, active smoking, and recent DVT who reports feeling fine until shortly before he presented to [facility] in October 2022 with bilateral DVTs and started on Xarelto.  An echo at that time reportedly showed an ejection fraction of 55 to  60% with anterior hypokinesis and severe mitral regurgitation.  He was then admitted to [facility] on 01/27/2021 with chest pain, leg swelling, cough, and hypoxemia.  He was COVID and influenza negative.  Lactate level initially was 3.8.  CTA of the chest showed no evidence of pulmonary embolism but showed right middle lobe pneumonia and a moderate sized right pleural effusion.  BNP was 1733.  High-sensitivity troponin was mildly elevated with a flat trend.  Ultrasound of his legs showed bilateral DVTs that were age-indeterminate.  He was treated with diuretic and ceftriaxone/azithromycin.  He developed nonsustained VT and was started on amiodarone.  A 2D echocardiogram done here showed an ejection fraction of 35 to 40% with severe hypokinesis of the inferior and inferolateral walls with moderate LV dilatation.  There is mild RV dilation with moderately decreased RV systolic function.  The posterior mitral leaflet was restricted with calcification of the mitral valve and severe mitral regurgitation.  There is felt to be at least mild mitral stenosis with a mean gradient of 8 mmHg.  PA systolic pressure is estimated at 60.  He had a central line placed and a Co-ox was 39%. He was started on milrinone 0.25 which has subsequently been increased to 0.375.  He underwent cardiac catheterization on 01/31/2021 which showed a 95% mid LAD stenosis with a patent left internal mammary graft filling the distal vessel.  The large ramus branch has about 80% stenosis.  The right coronary artery was  a large dominant vessel with mild nonobstructive disease.  A vein graft off the aorta was completely occluded and it was felt that this probably went to the ramus branch although Dr. Aundra Dubin reviewed the operative note from [facility] and this vein graft was to a posterolateral branch.  PA pressure was elevated at 76/25 with a mean of 44.  Wedge pressure was 22 with a V wave of 37.  Aortic pressure 100/60.  PA saturation was 58% with  arterial saturation 97%.  Cardiac index was 2.67 with a pulmonary vascular resistance at 4.1 Wood units.  PAPI was 2.8.  A TEE was performed on 02/02/2021 which showed mobile vegetation on both the anterior and posterior mitral valve leaflets which did not completely coapt.  There was some destruction of the leaflets.  There was felt to be severe mitral regurgitation.  Left ventricular ejection fraction was 35 to 40% with mild dilation of the LV cavity.  RV systolic function was mildly reduced.  The aortic valve is trileaflet with trivial regurgitation and no stenosis.  There are no vegetation seen on the aortic valve.  Tricuspid valve has mild regurgitation with no vegetation seen. He was seen by dental medicine and extraction of multiple teeth was indicated to decrease the risk of perioperative and postoperative infection.  Cardiothoracic surgery consultation was requested and the patient was evaluated by Dr. Cyndia Bent who was in agreement the patient would benefit from surgical intervention.  The risks and benefits of the procedure were explained to the patient and he was agreeable to proceed.  Brief Hospital Course:   The patient was taken to the operating room on 02/17/2021.  He underwent Redo Sternotomy, Mitral Valve Replacement with a 27/29 mm Ony-X Mechanical Valve, and CABG x 1 utilizing SVG to Ramus Intermediate.  He also underwent endoscopic harvest of greater saphenous vein from his right thigh.  He tolerated the procedure without difficulty and was taken to the SICU in stable condition.  The patient required Levophed, Neo-synephrine, Epinephrine and Milrinone post operatively. He was unable to be expected the evening of surgery.  He was weaned off the ventilator and extubated on 02/18/2021.  He was weaned off the Insulin drip. His pre op HGA1C was 4.7. Accu checks and SS PRN were stopped. He was hyperkalemic at 5.6 and was treated with [facility].  He was volume overloaded and IV diuretics were initiated.   His chest tube output was low.  His chest tubes were removed on 12/04. He had anemia post op and did require a transfusion on 12/04. He was started on Sildenafil for PAH. He was started on Coumadin and his PT and INR were monitored daily. On 12/06, INR was slightly decreased to 2. Coumadin was increased to 2.5 mg daily. Patient's acute systolic hear failure was managed by advanced heart failure. Spironolactone was added on 12/04 and Entresto on 12/05. Per advanced heart failure, Spironolactone was increased to 25 mg daily on 12/06. Code stroke was called the morning of 12/06 as patient had complaints of not being able to feel his right arm or leg. Neurology consulted and CT of head, MR angio, and MRI of brain done. Per neurology, MRI Brain demonstrated a small R frontal white matter stroke which appears subacute and does not explain his R sided symptoms. It was felt the episode may have been a TIA or ?hypoxia. Neurology recommendations were followed accordingly. Because his INR has not increased much, he was given 5 mg of Coumadin on 12/08. INR on 12/09  was 3.0 so the Coumadin dose was decreased to 3 mg.  By the following morning, the INR was 3.8.  It was decided to hold the Coumadin dosing and monitor.  On 02/27/2021, the INR was 3.6 and the Coumadin was held again. As of 12/12, he is on Toprol XL 12.5 mg daily, Digoxin, 0.125 mg daily, and Sildenafil 40 mg tid for PAH. He was then started on Farxiga 10 mg daily. In terms of diuresis, he is on Spironolactone 25 mg daily. INR went down to 2.7 on 12/13. It was felt Coumadin 3 mg would be dose needed. We ask the [facility] nurse to draw PT/INR on Thursday 03/03/2021 and fax or call results to [facility] (phone [phone]). Home health for IV antibiotics and blood draws for labs were arranged. As discussed with Dr. Cyndia Bent, patient is surgically stable for discharge today.   Latest Vital Signs: Blood pressure 119/79, pulse 67, temperature 97.6 F  (36.4 C), temperature source Oral, resp. rate 14, height 5' 11" (1.803 m), weight 70.8 kg, SpO2 93 %.  Physical Exam: Cardiovascular: RRR, sharp valve click Pulmonary: Clear Abdomen: Soft, non tender, bowel sounds present. Extremities: No lower extremity edema. Partal left foot amputation Wound: Clean and dry.  No erythema or signs of infection. Neurologic: Grossly intact without focal deficits  Discharge Condition: Stable and discharged to aunt's home.  Recent laboratory studies:  Lab Results  Component Value Date   WBC 13.6 (H) 03/01/2021   HGB 9.9 (L) 03/01/2021   HCT 34.3 (L) 03/01/2021   MCV 82.7 03/01/2021   PLT 476 (H) 03/01/2021   Lab Results  Component Value Date   NA 135 03/01/2021   K 4.5 03/01/2021   CL 101 03/01/2021   CO2 25 03/01/2021   CREATININE 0.84 03/01/2021   GLUCOSE 88 03/01/2021      Diagnostic Studies: DG Orthopantogram  Result Date: 02/02/2021 CLINICAL DATA:  Possible surgical patient with endocarditis and broken teeth EXAM: ORTHOPANTOGRAM/PANORAMIC COMPARISON:  None. FINDINGS: Multiple dental amalgams. No periapical lucency. Large carie of tooth 2. Fragmented or partially extracted most posterior left mandibular molar. IMPRESSION: 1. Multiple dental amalgams. No periapical lucency. 2. Large carie of most posterior remaining right maxillary molar. 3. Fragmented or partially extracted most posterior left mandibular molar. Correlate with direct visualization. Electronically Signed   By: Ulyses Jarred M.D.   On: 02/02/2021 20:26   DG Chest 2 View  Result Date: 02/16/2021 CLINICAL DATA:  Preoperative evaluation. EXAM: CHEST - 2 VIEW COMPARISON:  January 29, 2021 FINDINGS: Multiple sternal wires are noted. There is stable left internal jugular venous catheter positioning. A stable ill-defined opacity is seen within the lateral aspect of the mid right lung. There is no evidence of a pleural effusion or pneumothorax. The cardiac silhouette is mildly  enlarged and unchanged in size. A chronic fracture is seen involving the mid right clavicle. IMPRESSION: 1. Evidence of prior median sternotomy/CABG. 2. Stable area of airspace disease within the lateral aspect of the mid to lower right lung. Electronically Signed   By: Virgina Norfolk M.D.   On: 02/16/2021 21:38   CT HEAD WO CONTRAST (5MM)  Result Date: 02/02/2021 CLINICAL DATA:  TIA EXAM: CT HEAD WITHOUT CONTRAST TECHNIQUE: Contiguous axial images were obtained from the base of the skull through the vertex without intravenous contrast. COMPARISON:  December 06, 2020 FINDINGS: Brain: There is no acute intracranial hemorrhage, mass effect, or edema. No new loss of gray-white differentiation. Chronic right frontoparietal infarcts. Small chronic cerebellar  infarcts. Additional patchy low-density in the supratentorial white matter is nonspecific but probably reflects stable chronic microvascular ischemic changes. There is no extra-axial fluid collection. Ventricles and sulci are stable in size and configuration. Vascular: No hyperdense vessel or unexpected calcification. Skull: Calvarium is unremarkable. Sinuses/Orbits: No acute finding. Other: None. IMPRESSION: No acute intracranial hemorrhage or evidence of acute infarction. Stable chronic/nonemergent findings detailed above. Electronically Signed   By: Macy Mis M.D.   On: 02/02/2021 15:57   MR ANGIO HEAD WO CONTRAST  Addendum Date: 02/22/2021   ADDENDUM REPORT: 02/22/2021 11:31 ADDENDUM: CORRECTION: The focus of mild dwi hyperintensity and adjacent susceptibility artifact described in the left midbrain in the report is actually at the left pontomedulllary junction. Electronically Signed   By: Margaretha Sheffield M.D.   On: 02/22/2021 11:31   Addendum Date: 02/22/2021   ADDENDUM REPORT: 02/22/2021 11:03 ADDENDUM: Impression #2 under MRA neck should be " Potentially moderate stenosis of the proximal RIGHT ICA, although tortuosity limits assessment."  Electronically Signed   By: Margaretha Sheffield M.D.   On: 02/22/2021 11:03   Result Date: 02/22/2021 EXAM: MRI HEAD WITHOUT CONTRAST MRA HEAD WITHOUT CONTRAST MRA OF THE NECK WITHOUT AND WITH CONTRAST TECHNIQUE: Multiplanar, multi-echo pulse sequences of the brain and surrounding structures were acquired without intravenous contrast. Angiographic images of the Circle of Willis were acquired using MRA technique without intravenous contrast. Angiographic images of the neck were acquired using MRA technique without and with intravenous contrast. Carotid stenosis measurements (when applicable) are obtained utilizing NASCET criteria, using the distal internal carotid diameter as the denominator. COMPARISON:  No pertinent prior exam. FINDINGS: MR HEAD FINDINGS Brain: Small acute or early subacute infarct in the high right frontal white matter (series 2, image 34). Small focus of DWI hyperintensity in the left midbrain without ADC correlate. Of note, there is an adjacent focus susceptibility artifact, likely prior hemorrhage. Remote lacunar cerebellar infarcts bilaterally. Prior infarcts in the right frontal and parietal cortex with associated encephalomalacia and gliosis. Additional to moderate scattered T2/FLAIR hyperintensities in the white matter, nonspecific but compatible with chronic microvascular ischemic disease. No hydrocephalus, mass lesion, midline shift, or extra-axial fluid collection. Mild atrophy. Vascular: See below. Skull and upper cervical spine: Normal marrow signal. Sinuses/Orbits: Sinuses are largely clear.  Unremarkable orbits. Other: Small left mastoid effusion. MRA HEAD FINDINGS Anterior circulation: Bilateral intracranial ICAs MCAs, and ACAs are patent without proximal hemodynamically significant stenosis. No aneurysm identified. Posterior circulation: Bilateral intradural vertebral arteries are patent. Small, but patent basilar artery with bilateral posterior communicating arteries and small P1  PCAs, anatomic variant. Multifocal superimposed mild atherosclerotic narrowing of the basilar artery. Bilateral posterior cerebral arteries are patent without proximal hemodynamically significant stenosis Anatomic variants:Detailed above. MRA NECK FINDINGS Aortic arch: Limited evaluation due to artifact. Great vessel origins are patent. Right carotid system: Patent. Suspected mild to moderate stenosis of the proximal right ICA, although marked tortuosity in this region limits evaluation. 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 Both aneurysms have wide necks (measuring approximately 6 mm in diameter) and are superiorly directed. Left carotid system: Patent. Mild stenosis the proximal ICA. Approximately 2-3 mm outpouching rising from the anterolateral left ICA (see series 60153, image 160). Tortuous ICA at the skull base. Vertebral arteries: Co dominant. Limited evaluation proximally. No visible high-grade stenosis. Mildly tortuous bilaterally. Other: None. IMPRESSION: MRI: 1. Small acute or early subacute infarct in the high right frontal white matter (  series 2, image 34). 2. Small focus of mild DWI hyperintensity in the left midbrain is favored artifactual (over acute/subacute infarct) given no correlate ADC hypointensity and given adjacent susceptibility artifact (likely a prior hemorrhage). 3. Remote right frontoparietal and cerebellar infarcts. 4. Mild-to-moderate chronic microvascular disease. MRA head: 1. No large vessel occlusion or proximal hemodynamically significant stenosis. 2. 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 MRA neck: 1. 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 2. Potentially moderate stenosis of the proximal ICA, although  tortuosity limits assessment. 3. If the patient is able, CTA or catheter arteriogram could further evaluate the above findings. Preliminary findings discussed with Dr. Milas Gain via telephone at 9:40 a.m. Findings of aneurysms conveyed via text page to Dr. Milas Gain at 10:49 AM. Electronically Signed: By: Margaretha Sheffield M.D. On: 02/22/2021 10:57   MR ANGIO NECK W WO CONTRAST  Addendum Date: 02/22/2021   ADDENDUM REPORT: 02/22/2021 11:31 ADDENDUM: CORRECTION: The focus of mild dwi hyperintensity and adjacent susceptibility artifact described in the left midbrain in the report is actually at the left pontomedulllary junction. Electronically Signed   By: Margaretha Sheffield M.D.   On: 02/22/2021 11:31   Addendum Date: 02/22/2021   ADDENDUM REPORT: 02/22/2021 11:03 ADDENDUM: Impression #2 under MRA neck should be " Potentially moderate stenosis of the proximal RIGHT ICA, although tortuosity limits assessment." Electronically Signed   By: Margaretha Sheffield M.D.   On: 02/22/2021 11:03   Result Date: 02/22/2021 EXAM: MRI HEAD WITHOUT CONTRAST MRA HEAD WITHOUT CONTRAST MRA OF THE NECK WITHOUT AND WITH CONTRAST TECHNIQUE: Multiplanar, multi-echo pulse sequences of the brain and surrounding structures were acquired without intravenous contrast. Angiographic images of the Circle of Willis were acquired using MRA technique without intravenous contrast. Angiographic images of the neck were acquired using MRA technique without and with intravenous contrast. Carotid stenosis measurements (when applicable) are obtained utilizing NASCET criteria, using the distal internal carotid diameter as the denominator. COMPARISON:  No pertinent prior exam. FINDINGS: MR HEAD FINDINGS Brain: Small acute or early subacute infarct in the high right frontal white matter (series 2, image 34). Small focus of DWI hyperintensity in the left midbrain without ADC correlate. Of note, there is an adjacent focus susceptibility artifact, likely prior  hemorrhage. Remote lacunar cerebellar infarcts bilaterally. Prior infarcts in the right frontal and parietal cortex with associated encephalomalacia and gliosis. Additional to moderate scattered T2/FLAIR hyperintensities in the white matter, nonspecific but compatible with chronic microvascular ischemic disease. No hydrocephalus, mass lesion, midline shift, or extra-axial fluid collection. Mild atrophy. Vascular: See below. Skull and upper cervical spine: Normal marrow signal. Sinuses/Orbits: Sinuses are largely clear.  Unremarkable orbits. Other: Small left mastoid effusion. MRA HEAD FINDINGS Anterior circulation: Bilateral intracranial ICAs MCAs, and ACAs are patent without proximal hemodynamically significant stenosis. No aneurysm identified. Posterior circulation: Bilateral intradural vertebral arteries are patent. Small, but patent basilar artery with bilateral posterior communicating arteries and small P1 PCAs, anatomic variant. Multifocal superimposed mild atherosclerotic narrowing of the basilar artery. Bilateral posterior cerebral arteries are patent without proximal hemodynamically significant stenosis Anatomic variants:Detailed above. MRA NECK FINDINGS Aortic arch: Limited evaluation due to artifact. Great vessel origins are patent. Right carotid system: Patent. Suspected mild to moderate stenosis of the proximal right ICA, although marked tortuosity in this region limits evaluation. 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 Both aneurysms have wide necks (measuring approximately 6 mm in diameter) and are superiorly directed. Left carotid system: Patent. Mild stenosis the proximal ICA. Approximately 2-3 mm outpouching rising from the anterolateral left ICA (see series 60153, image 160). Tortuous ICA at the skull base. Vertebral arteries: Co dominant. Limited evaluation proximally. No visible high-grade stenosis. Mildly tortuous bilaterally.  Other: None. IMPRESSION: MRI: 1. Small acute or early subacute infarct in the high right frontal white matter (series 2, image 34). 2. Small focus of mild DWI hyperintensity in the left midbrain is favored artifactual (over acute/subacute infarct) given no correlate ADC hypointensity and given adjacent susceptibility artifact (likely a prior hemorrhage). 3. Remote right frontoparietal and cerebellar infarcts. 4. Mild-to-moderate chronic microvascular disease. MRA head: 1. No large vessel occlusion or proximal hemodynamically significant stenosis. 2. 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 MRA neck: 1. 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 2. Potentially moderate stenosis of the proximal ICA, although tortuosity limits assessment. 3. If the patient is able, CTA or catheter arteriogram could further evaluate the above findings. Preliminary findings discussed with Dr. Milas Gain via telephone at 9:40 a.m. Findings of aneurysms conveyed via text page to Dr. Milas Gain at 10:49 AM. Electronically Signed: By: Margaretha Sheffield M.D. On: 02/22/2021 10:57   MR BRAIN WO CONTRAST  Addendum Date: 02/22/2021   ADDENDUM REPORT: 02/22/2021 11:31 ADDENDUM: CORRECTION: The focus of mild dwi hyperintensity and adjacent susceptibility artifact described in the left midbrain in the report is actually at the left pontomedulllary junction. Electronically Signed   By: Margaretha Sheffield M.D.   On: 02/22/2021 11:31   Addendum Date: 02/22/2021   ADDENDUM REPORT: 02/22/2021 11:03 ADDENDUM: Impression #2 under MRA neck should be " Potentially moderate stenosis of the proximal RIGHT ICA, although tortuosity limits assessment." Electronically Signed   By: Margaretha Sheffield M.D.   On: 02/22/2021 11:03   Result Date: 02/22/2021 EXAM: MRI HEAD WITHOUT  CONTRAST MRA HEAD WITHOUT CONTRAST MRA OF THE NECK WITHOUT AND WITH CONTRAST TECHNIQUE: Multiplanar, multi-echo pulse sequences of the brain and surrounding structures were acquired without intravenous contrast. Angiographic images of the Circle of Willis were acquired using MRA technique without intravenous contrast. Angiographic images of the neck were acquired using MRA technique without and with intravenous contrast. Carotid stenosis measurements (when applicable) are obtained utilizing NASCET criteria, using the distal internal carotid diameter as the denominator. COMPARISON:  No pertinent prior exam. FINDINGS: MR HEAD FINDINGS Brain: Small acute or early subacute infarct in the high right frontal white matter (series 2, image 34). Small focus of DWI hyperintensity in the left midbrain without ADC correlate. Of note, there is an adjacent focus susceptibility artifact, likely prior hemorrhage. Remote lacunar cerebellar infarcts bilaterally. Prior infarcts in the right frontal and parietal cortex with associated encephalomalacia and gliosis. Additional to moderate scattered T2/FLAIR hyperintensities in the white matter, nonspecific but compatible with chronic microvascular ischemic disease. No hydrocephalus, mass lesion, midline shift, or extra-axial fluid collection. Mild atrophy. Vascular: See below. Skull and upper cervical spine: Normal marrow signal. Sinuses/Orbits: Sinuses are largely clear.  Unremarkable orbits. Other: Small left mastoid effusion. MRA HEAD FINDINGS Anterior circulation: Bilateral intracranial ICAs MCAs, and ACAs are patent without proximal hemodynamically significant stenosis. No aneurysm identified. Posterior circulation: Bilateral intradural vertebral arteries are patent. Small, but patent basilar artery  with bilateral posterior communicating arteries and small P1 PCAs, anatomic variant. Multifocal superimposed mild atherosclerotic narrowing of the basilar artery. Bilateral posterior  cerebral arteries are patent without proximal hemodynamically significant stenosis Anatomic variants:Detailed above. MRA NECK FINDINGS Aortic arch: Limited evaluation due to artifact. Great vessel origins are patent. Right carotid system: Patent. Suspected mild to moderate stenosis of the proximal right ICA, although marked tortuosity in this region limits evaluation. 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 Both aneurysms have wide necks (measuring approximately 6 mm in diameter) and are superiorly directed. Left carotid system: Patent. Mild stenosis the proximal ICA. Approximately 2-3 mm outpouching rising from the anterolateral left ICA (see series 60153, image 160). Tortuous ICA at the skull base. Vertebral arteries: Co dominant. Limited evaluation proximally. No visible high-grade stenosis. Mildly tortuous bilaterally. Other: None. IMPRESSION: MRI: 1. Small acute or early subacute infarct in the high right frontal white matter (series 2, image 34). 2. Small focus of mild DWI hyperintensity in the left midbrain is favored artifactual (over acute/subacute infarct) given no correlate ADC hypointensity and given adjacent susceptibility artifact (likely a prior hemorrhage). 3. Remote right frontoparietal and cerebellar infarcts. 4. Mild-to-moderate chronic microvascular disease. MRA head: 1. No large vessel occlusion or proximal hemodynamically significant stenosis. 2. 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 MRA neck: 1. 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 2. Potentially moderate stenosis of the proximal ICA, although tortuosity limits assessment. 3. If the patient is able, CTA or catheter arteriogram could further evaluate the above findings.  Preliminary findings discussed with Dr. Milas Gain via telephone at 9:40 a.m. Findings of aneurysms conveyed via text page to Dr. Milas Gain at 10:49 AM. Electronically Signed: By: Margaretha Sheffield M.D. On: 02/22/2021 10:57   CARDIAC CATHETERIZATION  Result Date: 01/31/2021   Mid LAD lesion is 95% stenosed.   Ramus-1 lesion is 50% stenosed.   Ramus-2 lesion is 80% stenosed.   Mid RCA lesion is 30% stenosed.   Origin lesion is 100% stenosed. 1.  Elevated R>L heart filling pressure. 2.  Adequate PAPI 3.  Mixed pulmonary venous/pulmonary arterial hypertension. 4.  Prominent V-waves in PCWP tracing 5.  Stable cardiac output on milrinone 0.375 6.  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  Patent LIMA-LAD with 99% mid LAD stenosis. Patent RCA and AV LCx.   DG Chest Port 1 View  Result Date: 02/20/2021 CLINICAL DATA:  Pleural effusion EXAM: PORTABLE CHEST 1 VIEW COMPARISON:  Chest x-rays dated 02/19/2021 and 02/18/2021. Chest CT dated 01/27/2021. FINDINGS: Stable cardiomegaly. Swan-Ganz catheter is stable in position with tip at the midline. Bilateral chest tubes have been removed. Presumed additional chest tube overlying the lower mediastinum. Mediastinal drain projects to the upper mediastinum. No pneumothorax is seen bilaterally. Persistent opacity at the lateral aspects of the RIGHT lower lung. Lungs otherwise clear. IMPRESSION: 1. Interval removal of bilateral chest tubes. No pneumothorax seen bilaterally. 2. Stable cardiomegaly. 3. Persistent opacity at the lateral aspects of the RIGHT lower lung, compatible with small pleural effusion and/or residual pneumonia. Electronically Signed   By: Franki Cabot M.D.   On: 02/20/2021 08:20   DG Chest Port 1 View  Result Date: 02/19/2021 CLINICAL DATA:  Status post CABG. Status post mitral valve replacement. EXAM: PORTABLE CHEST 1 VIEW COMPARISON:  Chest  x-ray dated 02/18/2021. FINDINGS: Endotracheal tube has been removed. LEFT IJ central line has been removed.  Enteric tube has been removed. Swan-Ganz catheter remains with tip just to the RIGHT of midline. Bilateral chest tubes are stable in position. Mediastinal drain remains. Heart size and mediastinal contours are stable. Median sternotomy wires appear intact and symmetric in alignment. Probable mild atelectasis and/or small pleural effusion within the RIGHT lower lung. No pneumothorax is seen. IMPRESSION: 1. Probable mild atelectasis and/or small pleural effusion within the RIGHT lower lung. No pneumothorax seen. 2. Interval removal of the endotracheal tube, LEFT IJ central line and enteric tube. Remaining support apparatus appears adequately positioned. Electronically Signed   By: Franki Cabot M.D.   On: 02/19/2021 08:55   DG Chest Port 1 View  Result Date: 02/18/2021 CLINICAL DATA:  40 year old male status post mitral valve replacement. EXAM: PORTABLE CHEST 1 VIEW COMPARISON:  Chest x-ray 02/17/2021. FINDINGS: An endotracheal tube is in place with tip 6.9 cm above the carina. There is a left-sided internal jugular central venous catheter with tip terminating in the innominate vein. Right IJ Cordis through which a Swan-Ganz catheter has been passed into the proximal right main pulmonary artery. Bilateral chest tubes in position with tips projecting over the mid thorax bilaterally. Midline mediastinal/pericardial drain. Additional tube projecting over the lower mediastinum and lower left hemithorax may represent an additional chest tube or additional mediastinal/pericardial drain. Lung volumes are normal. Patchy ill-defined opacities are noted in the periphery of the right middle lobe, concerning for area of airspace consolidation. Lungs otherwise appear clear. No pleural effusions. No pneumothorax. No evidence of pulmonary edema. Mild to moderate cardiomegaly. Upper mediastinal contours are within normal limits. Status post median sternotomy for CABG and mitral valve replacement. IMPRESSION: 1. Postoperative  changes and support apparatus, as above. 2. Peripheral consolidation in the lateral segment of the right middle lobe concerning for pneumonia. 3. Mild-to-moderate cardiomegaly. Electronically Signed   By: Vinnie Langton M.D.   On: 02/18/2021 08:56   DG Chest Port 1 View  Result Date: 02/17/2021 CLINICAL DATA:  Status post mitral valve replacement. EXAM: PORTABLE CHEST 1 VIEW COMPARISON:  One day prior FINDINGS: 2 AP portable radiographs with exclusion of the right costophrenic angle. Repeat median sternotomy. Endotracheal tube terminates 6.9 cm above carina. Nasogastric tube terminates at the body of the stomach with the side port just below the gastroesophageal junction. Left internal jugular line tip likely at the central left brachiocephalic vein. Mediastinal drain. Right internal jugular Swan with tip at pulmonary outflow tract. Three left and 1 right-sided chest tube. Midline trachea. Moderate cardiomegaly. No left and no large right pleural effusion. No pneumothorax. No congestive failure. Lateral right lung airspace disease is similar. IMPRESSION: Support apparatus as detailed above. No pneumothorax or other acute complication. Similar lateral right lower lobe airspace disease. Electronically Signed   By: Abigail Miyamoto M.D.   On: 02/17/2021 15:25   VAS [place] TRANSCRANIAL DOPPLER W BUBBLES  Result Date: 02/25/2021  Transcranial Doppler with Bubble Patient Name:  JASANI DOLNEY  Date of Exam:   02/24/2021 Medical Rec #: [id number]        Accession #:    [id number] Date of Birth: 03/25/1980         Patient Gender: M Patient Age:   58 years Exam Location:  [facility] Procedure:      VAS [place] TRANSCRANIAL DOPPLER W BUBBLES Referring Phys: Loralie Champagne --------------------------------------------------------------------------------  Indications: Stroke. Performing Technologist: Darlin Coco RDMS, RVT  Examination Guidelines: A complete evaluation includes B-mode imaging, spectral Doppler, color Doppler,  and power Doppler as needed of all accessible portions of each vessel. Bilateral testing is considered an integral part of a complete examination. Limited examinations for reoccurring indications may be performed as noted.  Summary: No HITS at rest or during Valsalva. Negative transcranial Doppler Bubble study with no evidence of right to left intracardiac communication.  Negative TCD Bubble study without any evidence of right to left shunt noted. *See table(s) above for TCD measurements and observations.  Diagnosing physician: Antony Contras MD Electronically signed by Antony Contras MD on 02/25/2021 at 11:00:20 AM.    Final    ECHO TEE  Result Date: 02/02/2021    TRANSESOPHOGEAL ECHO REPORT   Patient Name:   Mark Garrett Date of Exam: 02/02/2021 Medical Rec #:  [id number]       Height:       71.0 in Accession #:    [id number]      Weight:       156.3 lb Date of Birth:  11/12/1980        BSA:          1.899 m Patient Age:    64 years        BP:           115/78 mmHg Patient Gender: M               HR:           74 bpm. Exam Location:  Inpatient Procedure: Cardiac Doppler, Color Doppler, 3D Echo and Transesophageal Echo Indications:     Bacteremia  History:         Patient has prior history of Echocardiogram examinations, most                  recent 01/28/2021.  Sonographer:     Merrie Roof RDCS Referring Phys:  [place] Diagnosing Phys: Kirk Ruths McleanMD PROCEDURE: After discussion of the risks and benefits of a TEE, an informed consent was obtained from the patient. The transesophogeal probe was passed without difficulty through the esophogus of the patient. Local oropharyngeal anesthetic was provided with Cetacaine. Sedation performed by different physician. The patient was monitored while under deep sedation. The patient's vital signs; including heart rate, blood pressure, and oxygen saturation; remained stable throughout the procedure. The patient developed no complications during the procedure.  IMPRESSIONS  1. Left ventricular ejection fraction, by estimation, is 35 to 40%. The left ventricle has moderately decreased function. The left ventricle demonstrates regional wall motion abnormalities with inferior and inferolateral hypokinesis. The left ventricular internal cavity size was mildly dilated.  2. Right ventricular systolic function is mildly reduced. The right ventricular size is normal.  3. Peak RV-RA gradient 33 mmHg. No TV vegetation.  4. No pulmonary valve vegetation.  5. The aortic valve is tricuspid. Aortic valve regurgitation is trivial. No aortic stenosis is present. No AoV vegetation.  6. Left atrial size was moderately dilated. No left atrial/left atrial appendage thrombus was detected.  7. Right atrial size was moderately dilated.  8. No PFO or ASD by color doppler.  9. There was mobile vegetation on both the anterior and posterior mitral leaflets. The leaflets did not completely coapt (?destruction from endocarditis) with severe mitral regurgitation (ERO 0.56 cm^2). There was flattening but not flow reversal in the  pulmonary vein systolic doppler pattern. No significant stenosis. 10. There was extensive plaque in the proximal descending thoracic  aorta with what appears to be mobile vegetation attached to it. FINDINGS  Left Ventricle: Left ventricular ejection fraction, by estimation, is 35 to 40%. The left ventricle has moderately decreased function. The left ventricle demonstrates regional wall motion abnormalities. The left ventricular internal cavity size was mildly dilated. There is no left ventricular hypertrophy. Right Ventricle: The right ventricular size is normal. No increase in right ventricular wall thickness. Right ventricular systolic function is mildly reduced. Left Atrium: Left atrial size was moderately dilated. No left atrial/left atrial appendage thrombus was detected. Right Atrium: Right atrial size was moderately dilated. Pericardium: There is no evidence of  pericardial effusion. Mitral Valve: There was mobile vegetation on both the anterior and posterior mitral leaflets. The leaflets did not completely coapt (?destruction from endocarditis) with severe mitral regurgitation (ERO 0.56 cm^2). There was flattening but not flow reversal in the pulmonary vein systolic doppler pattern. No significant stenosis. The mitral valve is abnormal. Severe mitral valve regurgitation. Tricuspid Valve: Peak RV-RA gradient 33 mmHg. The tricuspid valve is normal in structure. Tricuspid valve regurgitation is mild. Aortic Valve: The aortic valve is tricuspid. Aortic valve regurgitation is trivial. No aortic stenosis is present. Pulmonic Valve: The pulmonic valve was normal in structure. Pulmonic valve regurgitation is not visualized. Aorta: There was extensive plaque in the proximal descending thoracic aorta with what appears to be mobile vegetation attached to it. The aortic root is normal in size and structure. IAS/Shunts: No PFO or ASD by color doppler.  MR Peak grad:    94.1 mmHg MR Mean grad:    54.0 mmHg MR Vmax:         485.00 cm/s MR Vmean:        333.0 cm/s MR PISA:         9.05 cm MR PISA Eff ROA: 56 mm MR PISA Radius:  1.20 cm Dalton McleanMD Electronically signed by Franki Monte Signature Date/Time: 02/02/2021/6:12:57 PM    Final    ECHO INTRAOPERATIVE TEE  Result Date: 02/18/2021  *INTRAOPERATIVE TRANSESOPHAGEAL REPORT *  Patient Name:   ABDEL EFFINGER Date of Exam: 02/17/2021 Medical Rec #:  [id number]       Height:       71.0 in Accession #:    [id number]      Weight:       154.7 lb Date of Birth:  Oct 15, 1980        BSA:          1.89 m Patient Age:    33 years        BP:           126/79 mmHg Patient Gender: M               HR:           58 bpm. Exam Location:  Anesthesiology Transesophogeal exam was perform intraoperatively during surgical procedure. Patient was closely monitored under general anesthesia during the entirety of examination. Indications:     Mitral  Valve Disease, Coronary Artery Disease Sonographer:     Bernadene Person RDCS Performing Phys: [id number] BARTLE Diagnosing Phys: Hoy Morn MD Complications: No known complications during this procedure. POST-OP IMPRESSIONS _ Left Ventricle: has moderately reduced systolic function, with an ejection fraction of 35%. The cavity size was mildly dilated. The wall motion is abnormal with regional variation. Hypokinetis in inferior and inferolateral walls unchanged from pre-bypass. _ Right Ventricle: The right ventricle appears unchanged from pre-bypass. _ Aortic Valve: The aortic valve appears  unchanged from pre-bypass. There is Trivial regurgitation. _ Mitral Valve: No stenosis present. A bileaflet mechanical device mechanical valve was placed, leaflets are freely mobile. washing jets present. The gradient recorded across the prosthetic valve is within the expected range. No perivalvular leak noted.s/p 27/29 mm On-X mechanical valve. _ Tricuspid Valve: The tricuspid valve appears unchanged from pre-bypass. There is mild regurgitation. _ Pulmonic Valve: The pulmonic valve appears unchanged from pre-bypass. _ Interatrial Septum: The interatrial septum appears unchanged from pre-bypass. _ Pericardium: The pericardium appears unchanged from pre-bypass. _ Comments: Post-bypass images reviewed with surgeon. PRE-OP FINDINGS  Left Ventricle: The left ventricle has mild-moderately reduced systolic function, with an ejection fraction of 40-45%. The cavity size was mildly dilated. There is no left ventricular hypertrophy.  LV Wall Scoring: The mid inferolateral segment and mid inferior segment are hypokinetic.  Right Ventricle: The right ventricle has mildly reduced systolic function. The cavity was normal. There is no increase in right ventricular wall thickness. Left Atrium: Left atrial size was dilated. No left atrial/left atrial appendage thrombus was detected. Right Atrium: Right atrial size was dilated. Interatrial  Septum: No atrial level shunt detected by color flow Doppler. Pericardium: There is no evidence of pericardial effusion. Mitral Valve: Mitral valve regurgitation is severe by color flow Doppler. The MR jet is centrally-directed. There is No evidence of mitral stenosis. Anterior and posterior leaflets did not appear to co-apt. Tricuspid Valve: The tricuspid valve was normal in structure. Tricuspid valve regurgitation is mild by color flow Doppler. Aortic Valve: The aortic valve is tricuspid Aortic valve regurgitation is trivial by color flow Doppler. Pulmonic Valve: The pulmonic valve was normal in structure. Pulmonic valve regurgitation is not visualized by color flow Doppler. Aorta: The aortic root is normal in size and structure. There is evidence of plaque in the descending aorta. There was extensive plaque in the proximal descending thoracic aorta with a mobile vegetation attached to it. Pulmonary Artery: The pulmonary artery is of normal size. +--------------+-------++ LEFT VENTRICLE        +--------------+-------++ PLAX 2D               +--------------+-------++ LVIDd:        6.88 cm +--------------+-------++ LVIDs:        5.84 cm +--------------+-------++ LV SV:        76 ml   +--------------+-------++ LV SV Index:  40.64   +--------------+-------++                       +--------------+-------++ +-------------+---------++ MITRAL VALVE           +-------------+---------++ MV Peak grad:8.0 mmHg  +-------------+---------++ MV Mean grad:3.0 mmHg  +-------------+---------++ MV Vmax:     1.41 m/s  +-------------+---------++ MV Vmean:    82.7 cm/s +-------------+---------++ MV VTI:      0.25 m    +-------------+---------++  Hoy Morn MD Electronically signed by Hoy Morn MD Signature Date/Time: 02/18/2021/4:58:52 PM    Final    VAS US DOPPLER PRE CABG  Result Date: 02/05/2021 PREOPERATIVE VASCULAR EVALUATION Patient Name:  JADRIAN BULMAN  Date of Exam:    02/04/2021 Medical Rec #: [id number]        Accession #:    [id number] Date of Birth: 06-24-1980         Patient Gender: M Patient Age:   35 years Exam Location:  [facility] Procedure:      VAS US DOPPLER PRE CABG Referring Phys: Gilford Raid --------------------------------------------------------------------------------  Indications:  Pre-CABG. Risk Factors:     Current smoker, prior MI, coronary artery disease. Other Factors:    CHF. Comparison Study: No previous exams Performing Technologist: Hill, Jody RVT, RDMS  Examination Guidelines: A complete evaluation includes B-mode imaging, spectral Doppler, color Doppler, and power Doppler as needed of all accessible portions of each vessel. Bilateral testing is considered an integral part of a complete examination. Limited examinations for reoccurring indications may be performed as noted.  Right Carotid Findings: +----------+--------+--------+--------+--------+------------------+           PSV cm/sEDV cm/sStenosisDescribeComments           +----------+--------+--------+--------+--------+------------------+ CCA Prox  122     23                      intimal thickening +----------+--------+--------+--------+--------+------------------+ CCA Distal98      24                      intimal thickening +----------+--------+--------+--------+--------+------------------+ ICA Prox  98      19                                         +----------+--------+--------+--------+--------+------------------+ ICA Distal94      33                                         +----------+--------+--------+--------+--------+------------------+ ECA       191     21                                         +----------+--------+--------+--------+--------+------------------+ +----------+--------+-------+-------------------------------------+------------+           PSV cm/sEDV cmsDescribe                             Arm Pressure  +----------+--------+-------+-------------------------------------+------------+ Subclavian98             monophasic - suggestive of more                                            proximal obstruction                              +----------+--------+-------+-------------------------------------+------------+ +---------+--------+--+--------+--+----------+ VertebralPSV cm/s73EDV cm/s13Retrograde +---------+--------+--+--------+--+----------+ Left Carotid Findings: +----------+--------+--------+--------+---------------------+--------+           PSV cm/sEDV cm/sStenosisDescribe             Comments +----------+--------+--------+--------+---------------------+--------+ ICA Prox  97      22      1-39%   hyperechoic and focal         +----------+--------+--------+--------+---------------------+--------+ ICA Distal131     36                                   tortuous +----------+--------+--------+--------+---------------------+--------+ ECA       214     17      >50%    heterogenous                  +----------+--------+--------+--------+---------------------+--------+ +----------+--------+--------+----------------+------------+  SubclavianPSV cm/sEDV cm/sDescribe        Arm Pressure +----------+--------+--------+----------------+------------+           170             Multiphasic, WNL             +----------+--------+--------+----------------+------------+ +---------+--------+--+--------+--+---------+ VertebralPSV cm/s89EDV cm/s17Antegrade +---------+--------+--+--------+--+---------+  ABI Findings: +---------+------------------+-----+----------+----------------------------+ Right    Rt Pressure (mmHg)IndexWaveform  Comment                      +---------+------------------+-----+----------+----------------------------+ Brachial 82                     monophasicevidence of subclavian steal  +---------+------------------+-----+----------+----------------------------+ PTA      67                0.52 monophasic                             +---------+------------------+-----+----------+----------------------------+ DP       56                0.43 monophasic                             +---------+------------------+-----+----------+----------------------------+ Great Toe9                 0.07 Abnormal                               +---------+------------------+-----+----------+----------------------------+ +---------+------------------+-----+----------+----------+ Left     Lt Pressure (mmHg)IndexWaveform  Comment    +---------+------------------+-----+----------+----------+ Brachial 129                    triphasic            +---------+------------------+-----+----------+----------+ PTA      70                0.54 monophasic           +---------+------------------+-----+----------+----------+ DP       58                0.45 monophasic           +---------+------------------+-----+----------+----------+ Great Toe                                 amputation +---------+------------------+-----+----------+----------+ +-------+---------------+----------------+ ABI/TBIToday's ABI/TBIPrevious ABI/TBI +-------+---------------+----------------+ Right  0.52                            +-------+---------------+----------------+ Left   0.54                            +-------+---------------+----------------+  Right Doppler Findings: +--------+--------+-----+----------+----------------------------+ Site    PressureIndexDoppler   Comments                     +--------+--------+-----+----------+----------------------------+ Argie Ramming           monophasicevidence of subclavian steal +--------+--------+-----+----------+----------------------------+ Radial               triphasic                               +--------+--------+-----+----------+----------------------------+ Ulnar  triphasic                              +--------+--------+-----+----------+----------------------------+  Left Doppler Findings: +--------+--------+-----+---------+--------+ Site    PressureIndexDoppler  Comments +--------+--------+-----+---------+--------+ [id number]          triphasic         +--------+--------+-----+---------+--------+ Radial               triphasic         +--------+--------+-----+---------+--------+ Ulnar                triphasic         +--------+--------+-----+---------+--------+  Summary: Right Carotid: The extracranial vessels were near-normal with only minimal wall                thickening or plaque. Left Carotid: Velocities in the left ICA are consistent with a 1-39% stenosis.               The ECA appears >50% stenosed. CCA not visualized due to PICC line               placement and bandaging. Vertebrals:  Left vertebral artery demonstrates antegrade flow. Right vertebral              artery demonstrates retrograde flow. Subclavians: Normal flow hemodynamics were seen in the left subclavian artery.              Left subclavian in monophasic - suggestive of more proximal              obstruction. Right ABI: Resting right ankle-brachial index indicates moderate right lower extremity arterial disease. The right toe-brachial index is abnormal. Borderline severe. Left ABI: Resting left ankle-brachial index indicates moderate left lower extremity arterial disease. Borderline severe. Right Upper Extremity: Doppler waveform obliterate with right radial compression. Doppler waveforms remain within normal limits with right ulnar compression. Left Upper Extremity: Doppler waveforms decrease <50% w left radial compression. Doppler waveforms remain within normal limits with left ulnar compression.  Electronically signed by Jamelle Haring on 02/05/2021 at 11:11:02 AM.    Final    CT  HEAD CODE STROKE WO CONTRAST  Result Date: 02/22/2021 CLINICAL DATA:  Code stroke.  Neuro deficit, acute, stroke suspected EXAM: CT HEAD WITHOUT CONTRAST TECHNIQUE: Contiguous axial images were obtained from the base of the skull through the vertex without intravenous contrast. COMPARISON:  Multiple priors, Most recent February 02, 2021. FINDINGS: Brain: New hypodensity in the high right frontal cortex, anterior to areas of remote infarct (for example series 3, image 26/27). More posteriorly, there are similar remote infarcts. No evidence of acute hemorrhage. No hydrocephalus. No mass lesion or midline shift. No visible extra-axial fluid collection. Vascular: No hyperdense vessel identified. Calcific intracranial atherosclerosis. Skull: No acute fracture. Sinuses/Orbits: Clear sinuses.  Unremarkable orbits. Other: No mastoid effusions. ASPECTS [facility] Stroke Program Early CT Score) total score (0-10 with 10 being normal): 9 at worst IMPRESSION: 1. New hypodensity in the high right frontal cortex, anterior to areas of remote infarct. This could be artifactual given streak artifact in this region; however, acute infarct is not excluded. An MRI could further evaluate if the patient is able. ASPECTS is 9 if this finding is real. 2. No acute hemorrhage. 3. 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 Findings discussed with Dr. Milas Gain via telephone at 9:24 a.m. Electronically Signed   By: Margaretha Sheffield M.D.   On: 02/22/2021 09:43  VAS [place] TRANSCRANIAL DOPPLER  Result Date: 02/25/2021  Transcranial Doppler Patient Name:  JW COVIN  Date of Exam:   02/24/2021 Medical Rec #: [id number]        Accession #:    [id number] Date of Birth: 1980-09-18         Patient Gender: M Patient Age:   12 years Exam Location:  [facility] Procedure:      VAS [place] TRANSCRANIAL DOPPLER Referring Phys: Loralie Champagne --------------------------------------------------------------------------------   Indications: Stroke. Comparison Study: 02-04-2021 Carotid duplex showed complete reversal of the                   right vertebral artery, monophasic subclavian artery                   waveforms. Performing Technologist: Darlin Coco RDMS, RVT  Examination Guidelines: A complete evaluation includes B-mode imaging, spectral Doppler, color Doppler, and power Doppler as needed of all accessible portions of each vessel. Bilateral testing is considered an integral part of a complete examination. Limited examinations for reoccurring indications may be performed as noted.  +----------+-------------+----------+-----------+----------+ RIGHT TCD Right VM (cm)Depth (cm)Pulsatility Comment   +----------+-------------+----------+-----------+----------+ MCA           45.00                 0.80               +----------+-------------+----------+-----------+----------+ ACA          -29.00                 0.70               +----------+-------------+----------+-----------+----------+ Term ICA      38.00                 0.84               +----------+-------------+----------+-----------+----------+ PCA           22.00                 0.45               +----------+-------------+----------+-----------+----------+ Opthalmic     28.00                 1.05               +----------+-------------+----------+-----------+----------+ ICA siphon    30.00                 1.23               +----------+-------------+----------+-----------+----------+ Vertebral     37.00                 1.90    Retrograde +----------+-------------+----------+-----------+----------+  +----------+------------+----------+-----------+-------+ LEFT TCD  Left VM (cm)Depth (cm)PulsatilityComment +----------+------------+----------+-----------+-------+ MCA          57.00                 0.94            +----------+------------+----------+-----------+-------+ ACA          -24.00                0.58             +----------+------------+----------+-----------+-------+ Term ICA     43.00                  1.0            +----------+------------+----------+-----------+-------+  PCA          19.00                 0.68            +----------+------------+----------+-----------+-------+ Opthalmic    30.00                 1.11            +----------+------------+----------+-----------+-------+ ICA siphon   32.00                 1.54            +----------+------------+----------+-----------+-------+ Vertebral    -46.00                1.07            +----------+------------+----------+-----------+-------+  +------------+------+-------+             VM cm Comment +------------+------+-------+ Prox Basilar-52.00        +------------+------+-------+ Dist Basilar-52.00        +------------+------+-------+ Summary: This was a normal transcranial Doppler study, with normal flow direction and velocity of all identified vessels of the anterior and posterior circulations, with no evidence of stenosis, vasospasm or occlusion. There was no evidence of intracranial disease. *See table(s) above for TCD measurements and observations.  Diagnosing physician: Antony Contras MD Electronically signed by Antony Contras MD on 02/25/2021 at 11:00:51 AM.    Final    ECHOCARDIOGRAM LIMITED  Result Date: 02/19/2021    ECHOCARDIOGRAM LIMITED REPORT   Patient Name:   TERIQUE KAWABATA Date of Exam: 02/19/2021 Medical Rec #:  [id number]       Height:       71.0 in Accession #:    [id number]      Weight:       159.2 lb Date of Birth:  01/18/1981        BSA:          1.914 m Patient Age:    55 years        BP:           141/53 mmHg Patient Gender: M               HR:           69 bpm. Exam Location:  Inpatient Procedure: Limited Echo, Color Doppler and Cardiac Doppler Indications:    Mitral Valve Disorder  History:        Patient has prior history of Echocardiogram examinations, most                 recent 02/17/2021. CHF;  COPD. 02/17/21 Redo sternotomy with                 C[employer] On-X 27/56m Mitral Valve Replacement.  Sonographer:    ERaquel SarnaSenior RDCS Referring Phys: [id number] DANIEL R BENSIMHON IMPRESSIONS  1. LVEF is depressed with severe hypokinesis/akinesis of the lateral wall, apex, inferior wall. The anterior wall is hypokinestic. COmpared to previous echo preop, wall motion changes are prominent . Left ventricular ejection fraction, by estimation, is  25 to 30%. The left ventricle has severely decreased function.  2. Right ventricular systolic function is moderately reduced. The right ventricular size is normal. There is severely elevated pulmonary artery systolic pressure.  3. 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 Peak and mean gradients through the valve are 15 and 6 mm Hg respecitvely . The mitral valve has been repaired/replaced.  4. The  aortic valve is tricuspid. Aortic valve regurgitation is mild to moderate. Aortic valve sclerosis is present, with no evidence of aortic valve stenosis.  5. The inferior vena cava is dilated in size with <50% respiratory variability, suggesting right atrial pressure of 15 mmHg. FINDINGS  Left Ventricle: LVEF is depressed with severe hypokinesis/akinesis of the lateral wall, apex, inferior wall. The anterior wall is hypokinestic. COmpared to previous echo preop, wall motion changes are prominent. Left ventricular ejection fraction, by estimation, is 25 to 30%. The left ventricle has severely decreased function. Right Ventricle: The right ventricular size is normal. Right ventricular systolic function is moderately reduced. There is severely elevated pulmonary artery systolic pressure. The tricuspid regurgitant velocity is 4.22 m/s, and with an assumed right atrial pressure of 15 mmHg, the estimated right ventricular systolic pressure is 48.1 mmHg. Pericardium: There is no evidence of pericardial effusion. Mitral Valve: Mechanical mitral prothesis (27/29 mm On-X valve,  02/17/21). Peak and mean gradients through the valve are 15 and 6 mm Hg respecitvely. The mitral valve has been repaired/replaced. MV peak gradient, 15.2 mmHg. The mean mitral valve gradient is 6.0 mmHg. Tricuspid Valve: The tricuspid valve is normal in structure. Tricuspid valve regurgitation is mild. Aortic Valve: The aortic valve is tricuspid. Aortic valve regurgitation is mild to moderate. Aortic regurgitation PHT measures 429 msec. Aortic valve sclerosis is present, with no evidence of aortic valve stenosis. Aortic valve mean gradient measures 14.0 mmHg. Aortic valve peak gradient measures 21.9 mmHg. Aortic valve area, by VTI measures 1.70 cm. Pulmonic Valve: The pulmonic valve was not assessed. Aorta: The aortic root is normal in size and structure. Venous: The inferior vena cava is dilated in size with less than 50% respiratory variability, suggesting right atrial pressure of 15 mmHg. Additional Comments: A device lead is visualized. LEFT VENTRICLE PLAX 2D LVOT diam:     1.80 cm LV SV:         58 LV SV Index:   30 LVOT Area:     2.54 cm  RIGHT VENTRICLE RV S prime:     7.18 cm/s TAPSE (M-mode): 1.6 cm AORTIC VALVE AV Area (Vmax):    1.76 cm AV Area (Vmean):   1.36 cm AV Area (VTI):     1.70 cm AV Vmax:           234.00 cm/s AV Vmean:          179.000 cm/s AV VTI:            0.340 m AV Peak Grad:      21.9 mmHg AV Mean Grad:      14.0 mmHg LVOT Vmax:         162.00 cm/s LVOT Vmean:        95.400 cm/s LVOT VTI:          0.227 m LVOT/AV VTI ratio: 0.67 AI PHT:            429 msec MITRAL VALVE                TRICUSPID VALVE MV Area (PHT): 2.27 cm     TR Peak grad:   71.2 mmHg MV Area VTI:   1.21 cm     TR Vmax:        422.00 cm/s MV Peak grad:  15.2 mmHg MV Mean grad:  6.0 mmHg     SHUNTS MV Vmax:       1.95 m/s     Systemic VTI:  0.23 m MV Vmean:  116.0 cm/s   Systemic Diam: 1.80 cm MV Decel Time: 334 msec MV E velocity: 173.00 cm/s MV A velocity: 87.20 cm/s MV E/A ratio:  1.98 Dorris Carnes MD  Electronically signed by Dorris Carnes MD Signature Date/Time: 02/19/2021/4:40:54 PM    Final    [place] EKG SITE RITE  Result Date: 02/21/2021 If Site Rite image not attached, placement could not be confirmed due to current cardiac rhythm.  [place] EKG SITE RITE  Result Date: 02/21/2021 If Site Rite image not attached, placement could not be confirmed due to current cardiac rhythm.    Discharge Medications: Allergies as of 03/01/2021       Reactions   Iodide Rash        Medication List     STOP taking these medications    Xarelto 20 MG Tabs tablet Generic drug: rivaroxaban       TAKE these medications    aspirin 81 MG chewable tablet Chew 1 tablet (81 mg total) by mouth daily.   cefTRIAXone  IVPB Commonly known as: ROCEPHIN Inject 2 g into the vein daily. Indication:  MV IE First Dose: Yes Last Day of Therapy:  03/30/21 Labs - Once weekly:  CBC/D and BMP, Labs - Every other week:  ESR and CRP Method of administration: IV Push Method of administration may be changed at the discretion of home infusion pharmacist based upon assessment of the patient and/or caregiver's ability to self-administer the medication ordered.   dapagliflozin propanediol 10 MG Tabs tablet Commonly known as: FARXIGA Take 1 tablet (10 mg total) by mouth daily. Start taking on: March 02, 2021   daptomycin  IVPB Commonly known as: CUBICIN Inject 650 mg into the vein daily. Indication:  MV IE First Dose: Yes Last Day of Therapy:  03/30/21 Labs - Once weekly:  CBC/D, BMP, and CPK Labs - Every other week:  ESR and CRP Method of administration: IV Push Method of administration may be changed at the discretion of home infusion pharmacist based upon assessment of the patient and/or caregiver's ability to self-administer the medication ordered.   digoxin 0.125 MG tablet Commonly known as: LANOXIN Take 1 tablet (0.125 mg total) by mouth daily. Start taking on: March 02, 2021   feeding supplement  Liqd Take 237 mLs by mouth 3 (three) times daily between meals.   metoprolol succinate 25 MG 24 hr tablet Commonly known as: TOPROL-XL Take 0.5 tablets (12.5 mg total) by mouth daily. Start taking on: March 02, 2021   multivitamin with minerals Tabs tablet Take 1 tablet by mouth daily.   rosuvastatin 40 MG tablet Commonly known as: CRESTOR Take 1 tablet (40 mg total) by mouth daily. Start taking on: March 02, 2021   sacubitril-valsartan 24-26 MG Commonly known as: ENTRESTO Take 1 tablet by mouth 2 (two) times daily.   sildenafil 20 MG tablet Commonly known as: REVATIO Take 2 tablets (40 mg total) by mouth 3 (three) times daily.   spironolactone 25 MG tablet Commonly known as: ALDACTONE Take 1 tablet (25 mg total) by mouth daily. Start taking on: March 02, 2021   traMADol 50 MG tablet Commonly known as: ULTRAM Take 1 tablet (50 mg total) by mouth every 6 (six) hours as needed for moderate pain.   warfarin 3 MG tablet Commonly known as: COUMADIN Take 1 tablet (3 mg total) by mouth daily at 4 PM. Or as directed               Durable Medical Equipment  (From  admission, onward)           Start     Ordered   02/23/21 1217  For home use only DME 3 n 1  Once        02/23/21 1216   02/23/21 1216  For home use only DME 4 wheeled rolling walker with seat  Once       Question:  Patient needs a walker to treat with the following condition  Answer:  CHF (congestive heart failure) ([place])   02/23/21 1216   02/23/21 1214  For home use only DME Hospital bed  Once       Question Answer Comment  Length of Need 6 Months   Patient has (list medical condition): CHF, CABG   The above medical condition requires: Patient requires the ability to reposition immediately   Head must be elevated greater than: 45 degrees   Bed type Semi-electric   Hoyer Lift Yes   Trapeze Bar Yes   Support Surface: Gel Overlay      02/23/21 1216   02/23/21 1202  Heart failure home health  orders  (Heart failure home health orders / Face to face)  Once       Comments: Heart Failure Follow-up Care:  Verify follow-up appointments per Patient Discharge Instructions. Confirm transportation arranged. Reconcile home medications with discharge medication list. Remove discontinued medications from use. Assist patient/caregiver to manage medications using pill box. Reinforce low sodium food selection Assessments: Vital signs and oxygen saturation at each visit. Assess home environment for safety concerns, caregiver support and availability of low-sodium foods. Consult Education officer, museum, PT/OT, Dietitian, and CNA based on assessments. Perform comprehensive cardiopulmonary assessment. Notify MD for any change in condition or weight gain of 3 pounds in one day or 5 pounds in one week with symptoms. Daily Weights and Symptom Monitoring: Ensure patient has access to scales. Teach patient/caregiver to weigh daily before breakfast and after voiding using same scale and record.    Teach patient/caregiver to track weight and symptoms and when to notify Provider. Activity: Develop individualized activity plan with patient/caregiver.  Discharge antibiotics to be given via PICC line:   Per pharmacy protocol Daptomycin and ceftriaxone Aim for Vancomycin trough 15-20 or AUC 400-550 (unless otherwise indicated)     Duration: 6weeks  End Date: 03/30/21   [facility] Care Per Protocol with Biopatch Use: Home health RN for IV administration and teaching, line care and labs.     Labs weekly while on IV antibiotics: __y CBC with differential __ BMP **TWICE WEEKLY ON VANCOMYCIN  __y CMP __y CRP _y_ ESR __ Vancomycin trough TWICE WEEKLY _y_ CK   y__ Please pull PIC at completion of IV antibiotics __ Please leave PIC in place until doctor has seen patient or been notified   Fax weekly labs to [phone]  Question Answer Comment  Heart Failure Follow-up Care [facility] at  [phone]   Obtain the following labs Basic Metabolic Panel   Lab frequency Weekly   Fax lab results to Other see comments   Diet Low Sodium Heart Healthy   Fluid restrictions: 1800 mL Fluid      02/23/21 1204              Discharge Care Instructions  (From admission, onward)           Start     Ordered   03/01/21 0000  Change dressing on IV access line weekly and PRN  (Home infusion instructions -  [facility] )        03/01/21 0816           The patient has been discharged on:   1.Beta Blocker:  Yes [  x ]                              No   [   ]                              If No, reason:  2.Ace Inhibitor/ARB: Yes [ x  ]                                     No  [    ]                                     If No, reason:  3.Statin:   Yes [  x ]                  No  [   ]                  If No, reason:  4.Ecasa:  Yes  [  x ]                  No   [   ]                  If No, reason:  Patient had ACS upon admission:No  Plavix/P2Y12 inhibitor: Yes [   ]                                      No  [  x ]  Follow Up Appointments:  [place]. Schedule an appointment as soon as possible for a visit.   Specialty: Family Medicine Contact information: [address] [place] [address] [place] Follow up.   Why: March 08, 2021 at 2:30 pm Contact information: [address] [place] [address] [phone]        [facility] Follow up.   Why: call to schedule transportation to [facility] appointments the day before appointment Contact information: [phone]        [place], [place], DMD. Call in 1 week(s).   Specialty: Dentistry Why: As needed, For suture removal Contact information: [place] [address] [phone]         Gaye Pollack, MD. Go on 03/30/2021.   Specialty:  Cardiothoracic Surgery Why: PA/LAT CXR to be taken (at [facility] which is in the same building as Dr. Vivi Martens office) on 01/11 at 12:00 pm;Appointment time is at 12:30 pm Contact information: [place] [address] [phone]         [facility]. Go on 03/31/2021.   Specialty: Cardiology Why: Appointment time is at 11:35 am Contact information: [address] [id number] [place] [phone]  [facility] Follow up.   Specialty: Cardiology Why: 03/08/21 at 2:00 PM  [facility] (Dr. Claris Gladden office) at [facility], [place] information: [address] [id number] [place] [facility] Follow up.   Why: Please draw a PT and INR on 12/15. Call or fax results to [facility]). Phone number [phone] number [phone]                Signed: Sharalyn Ink [facility] 03/01/2021, 2:53 PM

## 2021-02-21 NOTE — Progress Notes (Signed)
Patient ID: Mark Garrett, male   DOB: 1980-09-06, 40 y.o.   MRN: [id number]      Advanced Heart Failure Rounding Note  PCP-Cardiologist: None   Subjective:    12/1: Mechanical On-X mitral valve, CABG x 1 with SVG-ramus.  Extensive vegetation noted on excised valve. PA pressures 90-100 in OR  Remains on milrinone 0.25. Sildenafil started 12/3 for PAH. Mark Garrett now out.   No complaints this morning, sitting in chair.   Echo 12/3: EF 25-30% Mod dec RV function. MVR ok Personally reviewed   [place]:  PAP: (74)/(13-16) 74/16 CVP:  [5 mmHg-7 mmHg] 7 mmHg CO:  [8 L/min] 8 L/min CI:  [4.2 L/min/m2] 4.2 L/min/m2  PCWP 12  TEE (11/16): EF 35-40%, there was mobile vegetation on both the anterior and posterior mitral leaflets.  The leaflets did not completely coapt (?destruction from endocarditis) with severe mitral regurgitation (ERO 0.56 cm^2).  There was extensive plaque in the proximal descending thoracic aorta with what appears to be mobile vegetation attached to it.   LHC/RHC:   Coronary Findings  Diagnostic Dominance: Right Left Anterior Descending  Mid LAD lesion is 95% stenosed.  Ramus Intermedius  Ramus-1 lesion is 50% stenosed.  Ramus-2 lesion is 80% stenosed.  Right Coronary Artery  Mid RCA lesion is 30% stenosed.  Graft To PLV  Origin lesion is 100% stenosed.  LIMA Graft To Dist LAD  Intervention  No interventions have been documented. Right Heart  Right Heart Pressures RHC Procedural Findings: Hemodynamics (mmHg) on milrinone 0.375 RA mean 18 RV 69/20 PA 76/25, mean 44 PCWP mean 22, v-waves to 37 mmHg LV 100/23 AO 100/60  Oxygen saturations: PA 58% AO 97%  Cardiac Output (Fick) 5.33  Cardiac Index (Fick) 2.67 PVR 4.1 WU  Cardiac Output (Thermo) 4.89 Cardiac Index (Thermo) 2.45  PVR 4.5 WU  PAPI: 2.8    Objective:   Weight Range: 74.5 kg Body mass index is 22.91 kg/m.   Vital Signs:   Temp:  [98.5 F (36.9 C)-98.8 F (37.1 C)] 98.5 F (36.9  C) (12/04 2300) Pulse Rate:  [66-75] 69 (12/05 0700) Resp:  [13-25] 20 (12/05 0700) BP: (102-154)/(60-89) 150/84 (12/05 0700) SpO2:  [91 %-99 %] 96 % (12/05 0700) Arterial Line BP: (122-129)/(46-51) 129/51 (12/04 1100) Weight:  [74.5 kg] 74.5 kg (12/05 0500) Last BM Date: 02/20/21  Weight change: Filed Weights   02/19/21 0733 02/20/21 0358 02/21/21 0500  Weight: 72.2 kg 74.7 kg 74.5 kg    Intake/Output:   Intake/Output Summary (Last 24 hours) at 02/21/2021 0743 Last data filed at 02/21/2021 0700 Gross per 24 hour  Intake 1708.76 ml  Output 2285 ml  Net -576.24 ml      Physical Exam   General: NAD Neck: No JVD, no thyromegaly or thyroid nodule.  Lungs: Clear to auscultation bilaterally with normal respiratory effort. CV: Nondisplaced PMI.  Heart regular S1/S2 mechanical S1, no S3/S4, 1/6 SEM RUSB.  No peripheral edema.  No carotid bruit.  Normal pedal pulses.  Abdomen: Soft, nontender, no hepatosplenomegaly, no distention.  Skin: Intact without lesions or rashes.  Neurologic: Alert and oriented x 3.  Psych: Normal affect. Extremities: No clubbing or cyanosis. Left foot traumatic amputation.  HEENT: Normal.   Telemetry   SR 70s (personally reviewed)   Labs    CBC Recent Labs    02/20/21 0330 02/21/21 0500  WBC 14.1* 13.2*  HGB 7.7* 8.1*  HCT 25.8* 26.7*  MCV 81.1 81.7  PLT 123* 153   Basic  Metabolic Panel Recent Labs    02/18/21 1721 02/18/21 2137 02/20/21 0330 02/21/21 0500  NA  --    < > 134* 134*  K 5.1   < > 4.1 3.8  CL  --    < > 100 103  CO2  --    < > 27 25  GLUCOSE  --    < > 73 76  BUN  --    < > 15 13  CREATININE  --    < > 0.76 0.91  CALCIUM  --    < > 8.3* 8.3*  MG 2.1  --   --   --    < > = values in this interval not displayed.   Liver Function Tests No results for input(s): AST, ALT, ALKPHOS, BILITOT, PROT, ALBUMIN in the last 72 hours.  No results for input(s): LIPASE, AMYLASE in the last 72 hours. Cardiac Enzymes Recent  Labs    02/21/21 0500  CKTOTAL 56     BNP: BNP (last 3 results) Recent Labs    01/27/21 1034  BNP 1,733.0*    ProBNP (last 3 results) No results for input(s): PROBNP in the last 8760 hours.   D-Dimer No results for input(s): DDIMER in the last 72 hours. Hemoglobin A1C No results for input(s): HGBA1C in the last 72 hours.  Fasting Lipid Panel No results for input(s): CHOL, HDL, LDLCALC, TRIG, CHOLHDL, LDLDIRECT in the last 72 hours.  Thyroid Function Tests No results for input(s): TSH, T4TOTAL, T3FREE, THYROIDAB in the last 72 hours.  Invalid input(s): FREET3  Other results:   Imaging    [place] EKG SITE RITE  Result Date: 02/21/2021 If Site Rite image not attached, placement could not be confirmed due to current cardiac rhythm.    Medications:     Scheduled Medications:  acetaminophen  1,000 mg Oral Q6H   Or   acetaminophen (TYLENOL) oral liquid 160 mg/5 mL  1,000 mg Per Tube Q6H   albuterol  2.5 mg Nebulization BID   aspirin EC  325 mg Oral Daily   bisacodyl  10 mg Oral Daily   Or   bisacodyl  10 mg Rectal Daily   chlorhexidine gluconate (MEDLINE KIT)  15 mL Mouth Rinse BID   Chlorhexidine Gluconate Cloth  6 each Topical Daily   docusate sodium  200 mg Oral Daily   enoxaparin (LOVENOX) injection  40 mg Subcutaneous QHS   feeding supplement  237 mL Oral TID BM   mouth rinse  15 mL Mouth Rinse QID   pantoprazole  40 mg Oral Daily   rosuvastatin  40 mg Oral Daily   sacubitril-valsartan  1 tablet Oral BID   sildenafil  40 mg Oral TID   sodium chloride flush  3 mL Intravenous Q12H   sorbitol  45 mL Oral Once   spironolactone  12.5 mg Oral Daily   Warfarin - Physician Dosing Inpatient   Does not apply q1600    Infusions:  sodium chloride Stopped (02/20/21 1127)   sodium chloride     sodium chloride 10 mL/hr at 02/17/21 1435   cefTRIAXone (ROCEPHIN)  IV Stopped (02/20/21 1732)   DAPTOmycin (CUBICIN)  IV Stopped (02/21/21 0012)   lactated ringers 20  mL/hr at 02/17/21 1435   lactated ringers 20 mL/hr at 02/21/21 0700   milrinone 0.25 mcg/kg/min (02/21/21 0700)    PRN Medications: sodium chloride, hydrALAZINE, morphine injection, ondansetron (ZOFRAN) IV, oxyCODONE, sodium chloride flush, traMADol    Assessment/Plan   1.  Acute systolic CHF: Ischemic cardiomyopathy, symptoms worsened by severe MR.  Echo with EF 35-40%, severe hypokinesis inferior and inferolateral walls, moderate LV dilation with mild LVH, mild RV dilation with moderately decreased systolic function, restricted posterior mitral leaflet and calcified mitral valve with severe mitral regurgitation and at least mild mitral stenosis (mean gradient 8 mmHg), PASP 60. Per report, echo at [facility] showed EF 55-60% with severe MR in 10/22.  It was a technically difficult study per report.  It is possible that he had a new ACS event prior to admission with reported drop in EF though HS-TnI with mild elevation and no trend suggests that it was not immediately prior to admission.  Low output noted with co-ox 39% initially, lactate elevated when he was initially admitted.  Milrinone begun with stabilization.  Now s/p mechanical MVR and SVG-ramus.  Co-ox 63%, CVP about 16.  - decrease milrinone to 0.125 - Continue sildenafil 40 tid - Continue spironolactone 12.5 mg daily.  - Add Entresto 24/26 bid with elevated BP.  - Lasix 80 mg IV bid x 2 doses today.  - mobilize with PT.  2. CAD: S/p CABG 2006.  As above, with fall in EF and CHF exacerbation, possible ACS prior to admission.  However, mild HS-TnI elevation with no trend suggests that ACS was not immediately prior to admission.  Current elevation is likely demand ischemia from volume overload. LHC on 11/14 showed patent LIMA-LAD with SVG occluded at aorta (does not look new); there was complex 80% proximal ramus stenosis.  Initially assumed that the SVG was to the ramus, but subsequently got the CABG operative report from [place] and it  looks like it was an SVG-PLV.  There was minimal native RCA disease. LAD territory well-supplied by LIMA. Now s/p SVG-ramus on 12/1.  - ASA  - Crestor 40 3.  Mitral valve disease: Initial echo showed posterior MV leaflet restricted with severe MR, suspected primarily infarct-related MR given LV dilation and inferior/inferolateral severe hypokinesis.  However, TEE on 11/16 showed vegetation (not bulky but clearly present) on the posterior and anterior leaflets with poor leaflet coaptation, suggesting endocarditis may be the major cause of severe MR.  Reviewed with ID, suspect the mitral vegetations have been present for a while (severe MR on 10/22 echo as well, does not appear to have active infection).  Cannot rule out nonbacterial thrombotic endocarditis (had recent DVT also), but somewhat academic at this point as he will be on anticoagulation for the non-triggered DVT and we are going to treat with antibiotic course given presentation and high WBCs. ANA negative. Extensive vegetation noted on MV at time of MVR, sent for cultures.  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   - Warfarin started per TCTS - INR therapeutic, can decrease ASA to 81 daily.  4. 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  ?due to sedentary lifestyle + ?genetic predisposition.  He is not a Xarelto failure, had DVT found in 10/22 with Xarelto started at this point, 11/22 dopplers showed partially treated DVTs.  - He will be anticoagulated with mechanical valve.  5. Smoking: Needs to quit.  6. PNA: Cultures 11/18 NGTD.  RML PNA on CT chest.  - He completed course of abx initially for PNA.  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 He is able to walk on the stump.  - PT following, continue to mobilize => walk daily in hall.  8. 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  There is also mobile vegetation that appears adherent to plaque in the proximal descending thoracic  aorta.  CT head showed no evidence for embolic disease.  Possible source would be due to poor dentition. Blood cultures from 11/10 and 11/16 NGTD.  He was initially on abx for PNA.  We re-drew cultures on 11/16 and 11/24, all negative so far.  ESR is 1, CRP 1.7.  He had been already partially treated when we found the endocarditis (abx for PNA) and suspect that the endocarditis is old as he had severe MR also on echo in 10/22. ID following.  WBCs rose to >30 after teeth removed but now coming back down (13 today ).  Suspect worsening leukocytosis was a response to dental surgery. Extensive vegetation noted on MV at time of surgery, sample sent for culture => no growth so far. WBC coming down. Afebrile.  - Per ID, continue 6 wks of IV abx to 12/27 =>ceftriaxone and daptomycin.   Place PICC today.  9. Microcytic anemia: Post-op drop, hgb 8.1 today.  - Iron stores low, had Feraheme  - Transfuse hgb < 8. 10. 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 Pulmonary pressures elevated in 80-90 range initially post-MVR, decreased to 70s.  - Decrease milrinone to 0.125 - Continue sildenafil 40 tid 11. Probable R subclavian stenosis: R arm BP 30-40 points lower than left. - measure BP in left arm   Length of Stay: [place], MD  02/21/2021, 7:43 AM  Advanced Heart Failure Team Pager [phone] (M-F; 7a - 5p)  Please contact [facility] for night-coverage after hours (5p -7a ) and weekends on [email or web address]

## 2021-02-21 NOTE — Progress Notes (Signed)
Peripherally Inserted Central Catheter Placement  The IV Nurse has discussed with the patient and/or persons authorized to consent for the patient, the purpose of this procedure and the potential benefits and risks involved with this procedure.  The benefits include less needle sticks, lab draws from the catheter, and the patient may be discharged home with the catheter. Risks include, but not limited to, infection, bleeding, blood clot (thrombus formation), and puncture of an artery; nerve damage and irregular heartbeat and possibility to perform a PICC exchange if needed/ordered by physician.  Alternatives to this procedure were also discussed.  Bard Power PICC patient education guide, fact sheet on infection prevention and patient information card has been provided to patient /or left at bedside.    PICC Placement Documentation  PICC Triple Lumen 02/21/21 PICC Left Basilic 45 cm 0 cm (Active)  Indication for Insertion or Continuance of Line Prolonged intravenous therapies 02/21/21 1100  Exposed Catheter (cm) 0 cm 02/21/21 1100  Site Assessment Clean;Dry;Intact 02/21/21 1100  Lumen #1 Status Flushed;Blood return noted;Saline locked 02/21/21 1100  Lumen #2 Status Flushed;Blood return noted;Saline locked 02/21/21 1100  Lumen #3 Status Flushed;Blood return noted;Saline locked 02/21/21 1100  Dressing Type Transparent 02/21/21 1100  Dressing Status Clean;Dry;Intact 02/21/21 1100  Antimicrobial disc in place? Yes 02/21/21 1100  Dressing Change Due 02/28/21 02/21/21 1100       Tala Eber Ramos 02/21/2021, 11:03 AM

## 2021-02-21 NOTE — Progress Notes (Signed)
Physical Therapy Treatment Patient Details Name: Mark Garrett MRN: [id number] DOB: 1980-12-18 Today's Date: 02/21/2021   History of Present Illness Pt is a 40 y.o. male admitted 01/27/21 due to CHF exacerbation. S/p R/L heart cath and coronary graft angiography on 11/14. 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 MVR and CABG x 1 on 12/1. PMH includes CHF, CAD (s/p CABG 2006), mitral valve disease, traumatic amputation of L foot.    PT Comments    Pt now s/p MVR and CABG. Progressing well with mobility after surgery. Expect continued good progress and likely won't need PT after DC.    Recommendations for follow up therapy are one component of a multi-disciplinary discharge planning process, led by the attending physician.  Recommendations may be updated based on patient status, additional functional criteria and insurance authorization.  Follow Up Recommendations  No PT follow up     Assistance Recommended at Discharge PRN  Equipment Recommendations  Rollator (4 wheels)    Recommendations for Other Services       Precautions / Restrictions Precautions Precautions: Fall;Sternal;Other (comment) Precaution Comments: h/o L forefoot amputation (does not have prosthetic in room; ambulates with/without it at home)     Mobility  Bed Mobility Overal bed mobility: Needs Assistance Bed Mobility: Supine to Sit     Supine to sit: Min guard     General bed mobility comments: Assist for lines and verbal cues for sternal precautions    Transfers Overall transfer level: Needs assistance Equipment used: Rollator (4 wheels) Transfers: Sit to/from Stand Sit to Stand: Min guard           General transfer comment: Assist for lines and verbal cues for sternal precautions    Ambulation/Gait Ambulation/Gait assistance: Min guard Gait Distance (Feet): 290 Feet Assistive device: Rollator (4 wheels) Gait Pattern/deviations: Step-through  pattern;Decreased stride length Gait velocity: Decreased Gait velocity interpretation: 1.31 - 2.62 ft/sec, indicative of limited community ambulator   General Gait Details: Assist for lines/safety   Stairs             Wheelchair Mobility    Modified Rankin (Stroke Patients Only)       Balance Overall balance assessment: Needs assistance Sitting-balance support: No upper extremity supported;Feet supported Sitting balance-Leahy Scale: Normal     Standing balance support: No upper extremity supported;During functional activity Standing balance-Leahy Scale: Fair                              Cognition Arousal/Alertness: Awake/alert Behavior During Therapy: WFL for tasks assessed/performed;Flat affect Overall Cognitive Status: Within Functional Limits for tasks assessed                                          Exercises      General Comments General comments (skin integrity, edema, etc.): Amb on RA with SpO2 92%.      Pertinent Vitals/Pain Pain Assessment: No/denies pain    Home Living                          Prior Function            PT Goals (current goals can now be found in the care plan section) Acute Rehab PT Goals Patient Stated Goal: return home PT Goal  Formulation: With patient Time For Goal Achievement: 03/07/21 Potential to Achieve Goals: Good Progress towards PT goals: Progressing toward goals (goals updated s/p MVR)    Frequency    Min 3X/week      PT Plan Current plan remains appropriate;Frequency needs to be updated;Equipment recommendations need to be updated    Co-evaluation              AM-PAC PT "6 Clicks" Mobility   Outcome Measure  Help needed turning from your back to your side while in a flat bed without using bedrails?: A Little Help needed moving from lying on your back to sitting on the side of a flat bed without using bedrails?: A Little Help needed moving to and from a  bed to a chair (including a wheelchair)?: A Little Help needed standing up from a chair using your arms (e.g., wheelchair or bedside chair)?: A Little Help needed to walk in hospital room?: A Little Help needed climbing 3-5 steps with a railing? : A Little 6 Click Score: 18    End of Session   Activity Tolerance: Patient tolerated treatment well Patient left: in chair;with call bell/phone within reach Nurse Communication: Mobility status PT Visit Diagnosis: Unsteadiness on feet (R26.81);Muscle weakness (generalized) (M62.81)     Time: 1478-2956 PT Time Calculation (min) (ACUTE ONLY): 20 min  Charges:                        [facility] PT Acute Rehabilitation Services Pager [phone] Office [phone]    Angelina Ok Kelsey [facility] 02/21/2021, 5:24 PM

## 2021-02-21 NOTE — Progress Notes (Signed)
4 Days Post-Op Procedure(s) (LRB): REDO STERNOTOMY (N/A) MITRAL VALVE (MV) REPLACEMENT USING ON-X 27/29 MM PROSTHETIC MITRAL VALVE (N/A) CORONARY ARTERY BYPASS GRAFTING (CABG), ON PUMP, TIMES ONE, USING ENDOSCOPICALLY HARVESTED RIGHT GREATER SAPHENOUS VEIN (N/A) TRANSESOPHAGEAL ECHOCARDIOGRAM (TEE) (N/A) APPLICATION OF CELL SAVER Subjective: No complaints  Objective: Vital signs in last 24 hours: Temp:  [98.5 F (36.9 C)-98.8 F (37.1 C)] 98.5 F (36.9 C) (12/04 2300) Pulse Rate:  [66-75] 69 (12/05 0700) Cardiac Rhythm: Normal sinus rhythm (12/05 0400) Resp:  [13-25] 20 (12/05 0700) BP: (102-154)/(60-89) 150/84 (12/05 0700) SpO2:  [91 %-99 %] 96 % (12/05 0700) Arterial Line BP: (122-129)/(46-51) 129/51 (12/04 1100) Weight:  [74.5 kg] 74.5 kg (12/05 0500)  Hemodynamic parameters for last 24 hours: PAP: (74)/(13-16) 74/16 CVP:  [5 mmHg-7 mmHg] 7 mmHg CO:  [8 L/min] 8 L/min CI:  [4.2 L/min/m2] 4.2 L/min/m2  Intake/Output from previous day: 12/04 0701 - 12/05 0700 In: 1708.8 [P.O.:837; I.V.:691.5; IV Piggyback:180.2] Out: 2285 [Urine:2285] Intake/Output this shift: No intake/output data recorded.  General appearance: alert and cooperative Neurologic: intact Heart: regular rate and rhythm, crisp mechanical valve click Lungs: clear to auscultation bilaterally Extremities: extremities normal, atraumatic, no cyanosis or edema Wound: incision healing well  Lab Results: Recent Labs    02/20/21 0330 02/21/21 0500  WBC 14.1* 13.2*  HGB 7.7* 8.1*  HCT 25.8* 26.7*  PLT 123* 153   BMET:  Recent Labs    02/20/21 0330 02/21/21 0500  NA 134* 134*  K 4.1 3.8  CL 100 103  CO2 27 25  GLUCOSE 73 76  BUN 15 13  CREATININE 0.76 0.91  CALCIUM 8.3* 8.3*    PT/INR:  Recent Labs    02/21/21 0500  LABPROT 24.0*  INR 2.2*   ABG    Component Value Date/Time   PHART 7.357 02/18/2021 0958   HCO3 26.7 02/18/2021 0958   TCO2 28 02/18/2021 0958   ACIDBASEDEF 3.0 (H)  02/17/2021 1448   O2SAT 63.0 02/21/2021 0525   CBG (last 3)  Recent Labs    02/20/21 1219 02/20/21 1625 02/20/21 2122  GLUCAP 91 97 87    Assessment/Plan: S/P Procedure(s) (LRB): REDO STERNOTOMY (N/A) MITRAL VALVE (MV) REPLACEMENT USING ON-X 27/29 MM PROSTHETIC MITRAL VALVE (N/A) CORONARY ARTERY BYPASS GRAFTING (CABG), ON PUMP, TIMES ONE, USING ENDOSCOPICALLY HARVESTED RIGHT GREATER SAPHENOUS VEIN (N/A) TRANSESOPHAGEAL ECHOCARDIOGRAM (TEE) (N/A) APPLICATION OF CELL SAVER  POD 4 Hemodynamically stable in sinus rhythm. BP rising. Entresto added today. Co-ox 63% so milrinone decreased to 0.125. CVP 16 this and receiving lasix.  INR 2.2. Will decrease Coumadin a little to 2 mg daily and see if he continues to rise. DC pacing wires. ASA 81 mg for coronary disease and bypass graft.  Operative cultures negative so far. Continue antibiotics per ID and will have PICC line inserted and remove neck line.  Glucose under good control. DC CBG's  IS, ambulation  Transfer to 2C.      LOS: 25 days    Alleen Borne 02/21/2021

## 2021-02-21 NOTE — Progress Notes (Signed)
Patient ID: Mark Garrett, male   DOB: 1980/09/21, 40 y.o.   MRN: [id number] TCTS Evening Rounds:  -5L today after large diuresis and starting Entresto this am.   SBP 68 sitting up in chair. Says he feels fine with no dizziness. Encouraged to drink some fluids. Will hold Entresto and sildenafil until BP rises. No diuretic tonight.  Had PICC line inserted today.  Hold transfer to 2C until BP rises.

## 2021-02-22 ENCOUNTER — Inpatient Hospital Stay (HOSPITAL_COMMUNITY): Payer: Self-pay

## 2021-02-22 DIAGNOSIS — I639 Cerebral infarction, unspecified: Secondary | ICD-10-CM

## 2021-02-22 LAB — AEROBIC/ANAEROBIC CULTURE W GRAM STAIN (SURGICAL/DEEP WOUND): Culture: NO GROWTH

## 2021-02-22 LAB — BASIC METABOLIC PANEL
Anion gap: 9 (ref 5–15)
BUN: 15 mg/dL (ref 6–20)
CO2: 24 mmol/L (ref 22–32)
Calcium: 8.3 mg/dL — ABNORMAL LOW (ref 8.9–10.3)
Chloride: 101 mmol/L (ref 98–111)
Creatinine, Ser: 0.92 mg/dL (ref 0.61–1.24)
GFR, Estimated: >60 mL/min (ref >60)
Glucose, Bld: 79 mg/dL (ref 70–99)
Potassium: 4.4 mmol/L (ref 3.5–5.1)
Sodium: 134 mmol/L — ABNORMAL LOW (ref 135–145)

## 2021-02-22 LAB — PROTIME-INR
INR: 2 — ABNORMAL HIGH (ref 0.8–1.2)
Prothrombin Time: 22.7 seconds — ABNORMAL HIGH (ref 11.4–15.2)

## 2021-02-22 LAB — CBC
HCT: 30.3 % — ABNORMAL LOW (ref 39.0–52.0)
Hemoglobin: 8.8 g/dL — ABNORMAL LOW (ref 13.0–17.0)
MCH: 23.8 pg — ABNORMAL LOW (ref 26.0–34.0)
MCHC: 29 g/dL — ABNORMAL LOW (ref 30.0–36.0)
MCV: 81.9 fL (ref 80.0–100.0)
Platelets: 191 10*3/uL (ref 150–400)
RBC: 3.7 MIL/uL — ABNORMAL LOW (ref 4.22–5.81)
WBC: 13.5 10*3/uL — ABNORMAL HIGH (ref 4.0–10.5)
nRBC: 0.2 % (ref 0.0–0.2)

## 2021-02-22 LAB — COOXEMETRY PANEL
Carboxyhemoglobin: 1.9 % — ABNORMAL HIGH (ref 0.5–1.5)
Methemoglobin: 0.8 % (ref 0.0–1.5)
O2 Saturation: 77.7 %
Total hemoglobin: 9.2 g/dL — ABNORMAL LOW (ref 12.0–16.0)

## 2021-02-22 LAB — GLUCOSE, CAPILLARY
Glucose-Capillary: 156 mg/dL — ABNORMAL HIGH (ref 70–99)
Glucose-Capillary: 95 mg/dL (ref 70–99)
Glucose-Capillary: 91 mg/dL (ref 70–99)
Glucose-Capillary: 102 mg/dL — ABNORMAL HIGH (ref 70–99)

## 2021-02-22 MED ORDER — ADULT MULTIVITAMIN W/MINERALS CH
1.0000 | ORAL_TABLET | Freq: Every day | ORAL | Status: DC
  Administered 2021-02-22 – 2021-03-01 (×8): 1 via ORAL
  Filled 2021-02-22 (×8): qty 1

## 2021-02-22 MED ORDER — SPIRONOLACTONE 25 MG PO TABS
25.0000 mg | ORAL_TABLET | Freq: Every day | ORAL | Status: DC
  Administered 2021-02-22 – 2021-03-01 (×8): 25 mg via ORAL
  Filled 2021-02-22 (×8): qty 1

## 2021-02-22 MED ORDER — WARFARIN SODIUM 2.5 MG PO TABS
2.5000 mg | ORAL_TABLET | Freq: Every day | ORAL | Status: DC
  Administered 2021-02-22 – 2021-02-23 (×2): 2.5 mg via ORAL
  Filled 2021-02-22 (×2): qty 1

## 2021-02-22 MED ORDER — FUROSEMIDE 10 MG/ML IJ SOLN
40.0000 mg | Freq: Once | INTRAMUSCULAR | Status: AC
  Administered 2021-02-22: 40 mg via INTRAVENOUS
  Filled 2021-02-22: qty 4

## 2021-02-22 MED ORDER — GADOBUTROL 1 MMOL/ML IV SOLN
7.0000 mL | Freq: Once | INTRAVENOUS | Status: AC | PRN
  Administered 2021-02-22: 7 mL via INTRAVENOUS

## 2021-02-22 MED ORDER — TRAMADOL HCL 50 MG PO TABS: 50.0000 mg | ORAL_TABLET | ORAL | Status: DC | PRN

## 2021-02-22 MED ORDER — STROKE: EARLY STAGES OF RECOVERY BOOK
Freq: Once | Status: AC
  Administered 2021-02-22: 1
  Filled 2021-02-22: qty 1

## 2021-02-22 NOTE — Progress Notes (Signed)
Nutrition Follow-up  DOCUMENTATION CODES:   Severe malnutrition in context of chronic illness (component of social-environmental as well)  INTERVENTION:   Continue Ensure Enlive po TID, each supplement provides 350 kcal and 20 grams of protein  MVI with Minerals daily   NUTRITION DIAGNOSIS:   Severe Malnutrition related to chronic illness (CHF exacerbation) as evidenced by percent weight loss, severe fat depletion, energy intake < 75% for > or equal to 1 month.  Being addressed via supplements  GOAL:   Patient will meet greater than or equal to 90% of their needs  Progressing  MONITOR:   PO intake, Supplement acceptance, Labs, Weight trends, I & O's  REASON FOR ASSESSMENT:   Consult Assessment of nutrition requirement/status  ASSESSMENT:   Pt with PMH significant for asthma, COPD, tobacco abuse, s/p aortic revascularization, and DVT admitted with acute hypoxemic respiratory failure 2/2 acute systolic CHF exacerbation.  11/22 Dental extraction of 10 teeth 12/01 MV replacement, CABG x 1 12/02 Extubation 12/03 ECHO: EF 25-30% Mod dec RV function 12/06 Code Stroke  Code Stroke called this AM  NPO post Code Stroke, SLP eval pending. Recorded po intake on Dysphagia 3 diet mostly 100% Pt also receiving Ensure Supplements  Labs: reviewed Meds: dulcolax, colace  Diet Order:   Diet Order             Diet NPO time specified  Diet effective now                   EDUCATION NEEDS:   No education needs have been identified at this time  Skin:  Skin Assessment: Skin Integrity Issues: Skin Integrity Issues:: Stage I Stage I: buttocks  Last BM:  12/5  Height:   Ht Readings from Last 1 Encounters:  02/17/21 5\' 11"  (1.803 m)    Weight:   Wt Readings from Last 1 Encounters:  02/22/21 70 kg     BMI:  Body mass index is 21.53 kg/m.  Estimated Nutritional Needs:   Kcal:  2000-2200  Protein:  100-110 grams  Fluid:  >/=2L   14/06/22 MS,  RDN, LDN, CNSC Registered Dietitian III Clinical Nutrition RD Pager and On-Call Pager Number Located in [place]

## 2021-02-22 NOTE — Progress Notes (Signed)
PT CALLED NURSE INTO ROOM STATING HE COULDN'T FEEL HIS RIGHT SIDE. flaccid RIGHT ARM. MINIMAL MOVEMENT FROM RIGHT TOES. DECREASED MOVEMENT IN LEFT ARM. EQUAL SENSATION IN ALL EXTREMITIES. TONGUE MIDLINE.   CODE STROKE CALLED. DR. Shirlee Latch AT BEDSIDE.

## 2021-02-22 NOTE — TOC Progression Note (Addendum)
Transition of Care [facility]) - Progression Note    Patient Details  Name: Mark Garrett MRN: [id number] Date of Birth: 05/29/1980  Transition of Care [facility] CM/SW Contact  Nickey Kloepfer, LCSWA Phone Number: 02/22/2021, 4:39 PM  Clinical Narrative:    HF CSW spoke with Mr. [name] at bedside about doing a disability application with the [facility] and the discharge plan when he is medically ready. Mr. [name] is agreeable to staying at this [name], [name]'s house at time of discharge. Mr. [name] also agreed to a disability application and provided his signature and the CSW to send the disability application to the [facility]. Pending discharge plan due to Mr. [name] not having any health insurance and needing IV abx for 6 weeks.  HF RNCM reached out to [facility] with [employer] via secure chat to get an update about Medicaid.   CSW will continue to follow throughout discharge.   Expected Discharge Plan: Home/Self Care Barriers to Discharge: Continued Medical Work up  Expected Discharge Plan and Services Expected Discharge Plan: Home/Self Care In-house Referral: Clinical Social Work Discharge Planning Services: CM Consult, Indigent Health Clinic, MATCH Program, Medication Assistance, Follow-up appt scheduled   Living arrangements for the past 2 months: Single Family Home                   DME Agency: NA       HH Arranged: NA           Social Determinants of Health (SDOH) Interventions Food Insecurity Interventions: Other (Comment) (Patient has food stamps $250 a month) Financial Strain Interventions: Intervention Not Indicated, Artist (referral to [employer] to screen for Medicaid, patient agreeable) Housing Interventions: Intervention Not Indicated Transportation Interventions: Retail banker  Readmission Risk Interventions No flowsheet data found.  Arwyn Besaw, MSW, LCSWA [phone] Heart Failure Social Worker

## 2021-02-22 NOTE — Consult Note (Signed)
NEUROLOGY CONSULTATION NOTE   Date of service: February 22, 2021 Patient Name: Mark Garrett MRN:  [id number] DOB:  [birth year] Reason for consult: "Stroke code for R sided numbness" Requesting Provider: Larey Dresser, MD _ _ _   _ __   _ __ _ _  __ __   _ __   __ _  History of Present Illness  Mark Garrett is a 40 y.o. male with PMH significant for CAD s/p CABG, ischemic cardiomyopathy with severe mitral valve regurgitation, hx of DVTs admitted with SOB + Chest pain. Found to have mobile vegetation on TEE s/p MV replacement on 12/1 and on warfarin and aspirin with INR of 2.0 today.  He was sitting up in the chair. Around [phone], called his RN and reported that he could not feel his R side. Was also noted to be satting in 36s and tachypneic during this. Was transferred to bed, noted to be diffusely weak and a stroke code was activated. Exam had improved by the time I arrived. Reported subjective tingling in BL upper extremities, tachypneic on my arrival. Noted to have waxing and waning weakness that is generalized.   mRS: 3 tNKAse: not offered, he is on warfarin with ICR today of 2.0(subtherapeutic for his valve) Thrombectomy: Not offered, too mild to treat. NIHSS components Score: Comment  1a Level of Conscious 0[x]  1[]  2[]  3[]      1b LOC Questions 0[x]  1[]  2[]       1c LOC Commands 0[x]  1[]  2[]       2 Best Gaze 0[x]  1[]  2[]       3 Visual 0[x]  1[]  2[]  3[]      4 Facial Palsy 0[x]  1[]  2[]  3[]      5a Motor Arm - left 0[]  1[x]  2[]  3[]  4[]  UN[]    5b Motor Arm - Right 0[x]  1[]  2[]  3[]  4[]  UN[]    6a Motor Leg - Left 0[x]  1[]  2[]  3[]  4[]  UN[]    6b Motor Leg - Right 0[x]  1[]  2[]  3[]  4[]  UN[]    7 Limb Ataxia 0[]  1[]  2[x]  3[]  UN[]     8 Sensory 0[x]  1[]  2[]  UN[]      9 Best Language 0[x]  1[]  2[]  3[]      10 Dysarthria 0[x]  1[]  2[]  UN[]      11 Extinct. and Inattention 0[x]  1[]  2[]       TOTAL: 3      ROS   Constitutional Denies weight loss, fever and chills.   HEENT Denies changes in vision  and hearing.   Respiratory endorses SOB but no cough.   CV Denies palpitations and CP   GI Denies abdominal pain, nausea, vomiting and diarrhea.   GU Denies dysuria and urinary frequency.   MSK Denies myalgia and joint pain.   Skin Denies rash and pruritus.   Neurological Denies headache and syncope.   Psychiatric Denies recent changes in mood. Denies anxiety and depression.    Past History   Past Medical History:  Diagnosis Date  . COPD (chronic obstructive pulmonary disease) ([place])   . DVT (deep venous thrombosis) ([place])    Past Surgical History:  Procedure Laterality Date  . CORONARY ARTERY BYPASS GRAFT    . CORONARY ARTERY BYPASS GRAFT N/A 02/17/2021   Procedure: CORONARY ARTERY BYPASS GRAFTING (CABG), ON PUMP, TIMES ONE, USING ENDOSCOPICALLY HARVESTED RIGHT GREATER SAPHENOUS VEIN;  Surgeon: Gaye Pollack, MD;  Location: [place];  Service: Open Heart Surgery;  Laterality: N/A;  . HERNIA REPAIR    . MITRAL VALVE REPLACEMENT N/A  02/17/2021   Procedure: MITRAL VALVE (MV) REPLACEMENT USING ON-X 27/29 MM PROSTHETIC MITRAL VALVE;  Surgeon: Gaye Pollack, MD;  Location: [place];  Service: Open Heart Surgery;  Laterality: N/A;  . MULTIPLE EXTRACTIONS WITH ALVEOLOPLASTY N/A 02/08/2021   Procedure: MULTIPLE EXTRACTION WITH ALVEOLOPLASTY;  Surgeon: Charlaine Dalton, DMD;  Location: [place];  Service: Dentistry;  Laterality: N/A;  . RIGHT/LEFT HEART CATH AND CORONARY/GRAFT ANGIOGRAPHY N/A 01/31/2021   Procedure: RIGHT/LEFT HEART CATH AND CORONARY/GRAFT ANGIOGRAPHY;  Surgeon: Larey Dresser, MD;  Location: [place] CV LAB;  Service: Cardiovascular;  Laterality: N/A;  . TEE WITHOUT CARDIOVERSION N/A 02/02/2021   Procedure: TRANSESOPHAGEAL ECHOCARDIOGRAM (TEE);  Surgeon: Larey Dresser, MD;  Location: [facility] ENDOSCOPY;  Service: Cardiovascular;  Laterality: N/A;  . TEE WITHOUT CARDIOVERSION N/A 02/17/2021   Procedure: TRANSESOPHAGEAL ECHOCARDIOGRAM (TEE);  Surgeon: Gaye Pollack, MD;  Location:  [place];  Service: Open Heart Surgery;  Laterality: N/A;   Family History  Problem Relation Age of Onset  . CAD Mother   . CAD Father    Social History   Socioeconomic History  . Marital status: Unknown    Spouse name: Not on file  . Number of children: Not on file  . Years of education: Not on file  . Highest education level: Not on file  Occupational History  . Not on file  Tobacco Use  . Smoking status: Every Day    Packs/day: 0.50    Types: Cigarettes  . Smokeless tobacco: Never  Vaping Use  . Vaping Use: Never used  Substance and Sexual Activity  . Alcohol use: Yes    Comment: occ  . Drug use: Yes    Types: Marijuana  . Sexual activity: Yes  Other Topics Concern  . Not on file  Social History Narrative  . Not on file   Social Determinants of Health   Financial Resource Strain: High Risk  . Difficulty of Paying Living Expenses: Hard  Food Insecurity: No Food Insecurity  . Worried About Charity fundraiser in the Last Year: Never true  . Ran Out of Food in the Last Year: Never true  Transportation Needs: Unmet Transportation Needs  . Lack of Transportation (Medical): Yes  . Lack of Transportation (Non-Medical): Yes  Physical Activity: Not on file  Stress: Not on file  Social Connections: Not on file   Allergies  Allergen Reactions  . Iodide Rash    Medications   Medications Prior to Admission  Medication Sig Dispense Refill Last Dose  . XARELTO 20 MG TABS tablet Take 20 mg by mouth every evening.   01/25/2021 at [place]:   02/22/21 0700 02/22/21 0755 02/22/21 0800 02/22/21 0810  BP:   115/71   Pulse: 71  73   Resp: 19  20   Temp:    98.5 F (36.9 C)  TempSrc:    Oral  SpO2: 94% 94% 100%   Weight:      Height:         Body mass index is 21.53 kg/m.  Physical Exam   General: Laying comfortably in bed; in no acute distress.  HENT: Normal oropharynx and mucosa. Normal external appearance of ears and nose.  Neck: Supple,  no pain or tenderness  CV: No JVD. No peripheral edema.  Pulmonary: Symmetric Chest rise. Tachypneic but improved during the encounter Abdomen: Soft to touch, non-tender.  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 Skin:  Midline sternotomy scar. Normal palpation of skin.   Musculoskeletal: Normal digits and nails by inspection. No clubbing.   Neurologic Examination  Mental status/Cognition: Alert, oriented to self, place, month and year, good attention.  Speech/language: Fluent, comprehension intact, object naming intact, repetition intact.  Cranial nerves:   CN II Pupils equal and reactive to light, no VF deficits    CN III,IV,VI EOM intact, no gaze preference or deviation, no nystagmus    CN V normal sensation in V1, V2, and V3 segments bilaterally    CN VII no asymmetry, no nasolabial fold flattening    CN VIII normal hearing to speech    CN IX & X normal palatal elevation, no uvular deviation    CN XI 5/5 head turn and 5/5 shoulder shrug bilaterally    CN XII midline tongue protrusion    Motor:  Muscle bulk: poor, tone normal. Waxing and waning weakness in BL upper extremities but strength ranges from 3-4+ in RUE and 2-4 in LUE. RLE:4/5 LLE: 4/5.  Reflexes:  Right Left Comments  Pectoralis      Biceps (C5/6) 2 2   Brachioradialis (C5/6) 2 2    Triceps (C6/7) 2 2    Patellar (L3/4) 2 2    Achilles (S1)      Hoffman      Plantar     Jaw jerk    Sensation:  Light touch Intact throughout to touch, reports subjective tingling   Pin prick    Temperature    Vibration   Proprioception    Coordination/Complex Motor:  - Finger to Nose are poorly coordinated in BL upper extremities. - Heel to shin unable to do due to weakness. - Rapid alternating movement are slowed - Gait: unsafe to assess.  Labs   CBC:  Recent Labs  Lab 02/21/21 0500 02/22/21 0305  WBC 13.2* 13.5*  HGB 8.1* 8.8*  HCT 26.7* 30.3*  MCV 81.7 81.9  PLT 153 191    Basic  Metabolic Panel:  Lab Results  Component Value Date   NA 134 (L) 02/22/2021   K 4.4 02/22/2021   CO2 24 02/22/2021   GLUCOSE 79 02/22/2021   BUN 15 02/22/2021   CREATININE 0.92 02/22/2021   CALCIUM 8.3 (L) 02/22/2021   GFRNONAA >60 02/22/2021   Lipid Panel:  Lab Results  Component Value Date   LDLCALC 74 01/30/2021   HgbA1c:  Lab Results  Component Value Date   HGBA1C 4.7 (L) 02/16/2021   Urine Drug Screen: No results found for: LABOPIA, COCAINSCRNUR, LABBENZ, AMPHETMU, THCU, LABBARB  Alcohol Level No results found for: [place]  CT Head without contrast(Personally reviewed): CTH with potential high R frontal lobe hypodensity, felt to be streak artifact.  MR Angio head without contrast and MR Angio neck with and without contrast(Personally reviewed): No obvious LVO. Tortuous R ICA with 2 aneurysms. Moderate proximal R ICA stenosis.  MRI Brain(Personally reviewed): Punctate R frontal white matter stroke with weak ADC correlate, likely subacute.  Impression   QUINZELL FOLLETT is a 40 y.o. male with MV regurgitation and mobile vegetation s/p MV replacement on 12/1 and on warfarin and aspirin with INR of 2.0 with an acute episode of generalized weakness and R sided numbness in the setting of hypoxia. MRI Brain demonstrated a small R frontal white matter stroke which appears subacute and does not explain his R sided symptoms.  Episode could have been a TIA or just hypoxia. He was fairly tachypneic when I saw him which improved.  His exam was also waxing and waning.  Primary Diagnosis:  Other cerebral infarction due to occlusion of stenosis of small artery.  Secondary Diagnosis: Acute on chronic systolic (congestive) heart failure and Possible Sepsis  Recommendations  Plan:  - Frequent Neuro checks per stroke unit protocol - recent TTE with EF of 25-30% - Recommend obtaining Lipid panel with LDL - Please start statin if LDL > 70 - Recommend HbA1c - okay to continue Warfarin,  goal INR per primary team. - SBP goal - permissive hypertension first 24 h < 220/110 if possible. - Recommend Telemetry monitoring for arrythmia - Recommend bedside swallow screen prior to PO intake. - Stroke education booklet - Recommend PT/OT/SLP consult   Plan discussed with Dr. Freida Busman over secure chat. ______________________________________________________________________  This patient is critically ill and at significant risk of neurological worsening, death and care requires constant monitoring of vital signs, hemodynamics,respiratory and cardiac monitoring, neurological assessment, discussion with family, other specialists and medical decision making of high complexity. I spent 40 minutes of neurocritical care time  in the care of  this patient. This was time spent independent of any time provided by nurse practitioner or PA.  Erick Blinks [facility] Pager Number [phone] 02/22/2021  11:50 AM   Thank you for the opportunity to take part in the care of this patient. If you have any further questions, please contact the neurology consultation attending.  Signed,  Erick Blinks [facility] Pager Number [phone] _ _ _   _ __   _ __ _ _  __ __   _ __   __ _
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
## 2021-02-22 NOTE — Code Documentation (Signed)
Stroke Response Nurse Documentation Code Documentation  Mark Garrett is a 40 y.o. male admitted to [facility] on 01/27/21 with past medical hx of CHF, PE, CAD, MVR replacement. On warfarin daily. Code stroke was activated by Wilmon Arms, RN on 2H.  Patient LKW at 38. RN reports patient was talking and well at 0850 sitting in the recliner and using the urinal. Patient called out moments later complaining that he could not feel his right arm or leg. Patient extremities flaccid upon initial  assessment but improving during assessment.   Stroke team at the bedside. Patient to CT with team. NIHSS 7, see documentation for details and code stroke times. Patient with bilateral arm weakness, bilateral leg weakness, and right decreased sensation on exam. The following imaging was completed:  CTA, MRI, MRA. Patient is not a candidate for IV Thrombolytic due to being on Warfarin. Patient is not a candidate for IR due to negative for LVO.  Care/Plan: Q2 neuro checks  Bedside handoff with Misha, RN.  Toniann Fail  Stroke Response RN
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
## 2021-02-22 NOTE — Progress Notes (Signed)
CARDIAC REHAB PHASE I   Went to offer to walk with pt. Pt states sudden R sided numbness/weakness this morning, and they are ruling out a stroke. Pt states numbness is better but still lacks coordination in his R arm. Provided support to pt. Will continue to follow as appropriate.  [id number] Reynold Bowen, RN BSN 02/22/2021 11:09 AM

## 2021-02-22 NOTE — Progress Notes (Addendum)
Patient ID: Mark Garrett, male   DOB: 04/19/1980, 40 y.o.   MRN: [id number]  Called to see patient for acute change in neurologic status.  Per nursing, patient called out that his right side was tingling and he could not move his arms.  On my arrival, he was weak on both sides, R>L.  He reported R-side>L-side tingling.  Tongue midline, no facial droop, mentation and speech normal.   Code Stroke called, neurology responded.  Not candidate for thrombolytic with INR 2.0.  Patient is on warfarin for On-X mechanical MV (goal INR 2.5-3.5) with ASA 81 daily.  Of note, he had highly mobile plaque noted in the aortic arch on prior TEEs, ?embolic event from this place versus valve.   Patient taken directly to CT by neurology, ?option for IR intervention.  Avoid right radial given suspected right subclavian obstruction.   CRITICAL CARE Performed by: Marca Ancona  Total critical care time: 30 minutes  Critical care time was exclusive of separately billable procedures and treating other patients.  Critical care was necessary to treat or prevent imminent or life-threatening deterioration.  Critical care was time spent personally by me on the following activities: development of treatment plan with patient and/or surrogate as well as nursing, discussions with consultants, evaluation of patient's response to treatment, examination of patient, obtaining history from patient or surrogate, ordering and performing treatments and interventions, ordering and review of laboratory studies, ordering and review of radiographic studies, pulse oximetry and re-evaluation of patient's condition.  Marca Ancona 02/22/2021 9:44 AM

## 2021-02-23 LAB — BASIC METABOLIC PANEL
Anion gap: 10 (ref 5–15)
BUN: 15 mg/dL (ref 6–20)
CO2: 25 mmol/L (ref 22–32)
Calcium: 8.6 mg/dL — ABNORMAL LOW (ref 8.9–10.3)
Chloride: 98 mmol/L (ref 98–111)
Creatinine, Ser: 0.8 mg/dL (ref 0.61–1.24)
GFR, Estimated: >60 mL/min (ref >60)
Glucose, Bld: 84 mg/dL (ref 70–99)
Potassium: 4.3 mmol/L (ref 3.5–5.1)
Sodium: 133 mmol/L — ABNORMAL LOW (ref 135–145)

## 2021-02-23 LAB — GLUCOSE, CAPILLARY
Glucose-Capillary: 64 mg/dL — ABNORMAL LOW (ref 70–99)
Glucose-Capillary: 105 mg/dL — ABNORMAL HIGH (ref 70–99)
Glucose-Capillary: 84 mg/dL (ref 70–99)
Glucose-Capillary: 83 mg/dL (ref 70–99)
Glucose-Capillary: 108 mg/dL — ABNORMAL HIGH (ref 70–99)

## 2021-02-23 LAB — LIPID PANEL
Cholesterol: 79 mg/dL (ref 0–200)
HDL: 25 mg/dL — ABNORMAL LOW (ref >40)
LDL Cholesterol: 38 mg/dL (ref 0–99)
Total CHOL/HDL Ratio: 3.2 RATIO
Triglycerides: 80 mg/dL (ref <150)
VLDL: 16 mg/dL (ref 0–40)

## 2021-02-23 LAB — COOXEMETRY PANEL
Carboxyhemoglobin: 1.6 % — ABNORMAL HIGH (ref 0.5–1.5)
Methemoglobin: 0.8 % (ref 0.0–1.5)
O2 Saturation: 53.7 %
Total hemoglobin: 9.9 g/dL — ABNORMAL LOW (ref 12.0–16.0)

## 2021-02-23 LAB — HEMOGLOBIN A1C
Hgb A1c MFr Bld: 4.7 % — ABNORMAL LOW (ref 4.8–5.6)
Mean Plasma Glucose: 88.19 mg/dL

## 2021-02-23 LAB — PROTIME-INR
INR: 2.2 — ABNORMAL HIGH (ref 0.8–1.2)
Prothrombin Time: 24.6 seconds — ABNORMAL HIGH (ref 11.4–15.2)

## 2021-02-23 MED ORDER — SODIUM CHLORIDE 0.9% FLUSH
3.0000 mL | Freq: Two times a day (BID) | INTRAVENOUS | Status: DC
  Administered 2021-02-23 – 2021-03-01 (×9): 3 mL via INTRAVENOUS

## 2021-02-23 MED ORDER — ALBUTEROL SULFATE (2.5 MG/3ML) 0.083% IN NEBU: 2.5000 mg | INHALATION_SOLUTION | Freq: Four times a day (QID) | RESPIRATORY_TRACT | Status: DC | PRN

## 2021-02-23 MED ORDER — ONDANSETRON HCL 4 MG PO TABS: 4.0000 mg | ORAL_TABLET | Freq: Four times a day (QID) | ORAL | Status: DC | PRN

## 2021-02-23 MED ORDER — ONDANSETRON HCL 4 MG/2ML IJ SOLN: 4.0000 mg | Freq: Four times a day (QID) | INTRAMUSCULAR | Status: DC | PRN

## 2021-02-23 MED ORDER — OXYCODONE HCL 5 MG PO TABS: 5.0000 mg | ORAL_TABLET | ORAL | Status: DC | PRN

## 2021-02-23 MED ORDER — SODIUM CHLORIDE 0.9 % IV SOLN: 250.0000 mL | INTRAVENOUS | Status: DC | PRN

## 2021-02-23 MED ORDER — PANTOPRAZOLE SODIUM 40 MG PO TBEC
40.0000 mg | DELAYED_RELEASE_TABLET | Freq: Every day | ORAL | Status: DC
  Administered 2021-02-24 – 2021-03-01 (×6): 40 mg via ORAL
  Filled 2021-02-23 (×6): qty 1

## 2021-02-23 MED ORDER — CHLORHEXIDINE GLUCONATE CLOTH 2 % EX PADS
6.0000 | MEDICATED_PAD | Freq: Every day | CUTANEOUS | Status: DC
  Administered 2021-02-23 – 2021-03-01 (×7): 6 via TOPICAL

## 2021-02-23 MED ORDER — ~~LOC~~ CARDIAC SURGERY, PATIENT & FAMILY EDUCATION
Freq: Once | Status: AC
  Filled 2021-02-23 (×2): qty 1

## 2021-02-23 MED ORDER — DAPAGLIFLOZIN PROPANEDIOL 10 MG PO TABS
10.0000 mg | ORAL_TABLET | Freq: Every day | ORAL | Status: DC
  Administered 2021-02-23 – 2021-03-01 (×7): 10 mg via ORAL
  Filled 2021-02-23 (×7): qty 1

## 2021-02-23 MED ORDER — SENNOSIDES-DOCUSATE SODIUM 8.6-50 MG PO TABS: 1.0000 | ORAL_TABLET | Freq: Two times a day (BID) | ORAL | Status: DC | PRN

## 2021-02-23 MED ORDER — TRAMADOL HCL 50 MG PO TABS: 50.0000 mg | ORAL_TABLET | ORAL | Status: DC | PRN

## 2021-02-23 MED ORDER — SODIUM CHLORIDE 0.9% FLUSH: 3.0000 mL | INTRAVENOUS | Status: DC | PRN

## 2021-02-23 MED FILL — Electrolyte-R (PH 7.4) Solution: INTRAVENOUS | Qty: 4000 | Status: AC

## 2021-02-23 MED FILL — Mannitol IV Soln 20%: INTRAVENOUS | Qty: 500 | Status: AC

## 2021-02-23 MED FILL — Sodium Chloride IV Soln 0.9%: INTRAVENOUS | Qty: 3000 | Status: AC

## 2021-02-23 MED FILL — Heparin Sodium (Porcine) Inj 1000 Unit/ML: INTRAMUSCULAR | Qty: 10 | Status: AC

## 2021-02-23 NOTE — Plan of Care (Signed)
OPAT ORDERS:  Diagnosis: Culture negative native valve mitral valve vegetation SP  MVR  Culture Result: MVR on 12/1 with negative Cx. [address] PCR ordered.    Allergies  Allergen Reactions   Iodide Rash     Discharge antibiotics to be given via PICC line:  Per pharmacy protocol Daptomycin and ceftriaxone Aim for Vancomycin trough 15-20 or AUC 400-550 (unless otherwise indicated)   Duration: 6weeks  End Date: 03/30/21  [facility] Care Per Protocol with [employer] Use: Home health RN for IV administration and teaching, line care and labs.    Labs weekly while on IV antibiotics: __y CBC with differential __ BMP **TWICE WEEKLY ON VANCOMYCIN  __y CMP __y CRP _y_ ESR __ Vancomycin trough TWICE WEEKLY _y_ CK  y__ Please pull PIC at completion of IV antibiotics __ Please leave PIC in place until doctor has seen patient or been notified  Fax weekly labs to [phone]  Clinic Follow Up Appt: 12/30 at 845AM  @ [facility] with Dr. Candiss Norse

## 2021-02-23 NOTE — Progress Notes (Addendum)
STROKE TEAM PROGRESS NOTE   HISTORY OF PRESENT ILLNESS (per record) Mark Garrett is a 40 y.o. male with PMH significant for CAD s/p CABG, ischemic cardiomyopathy with severe mitral valve regurgitation, hx of DVTs admitted with SOB + Chest pain. Found to have mobile vegetation on TEE s/p MV replacement on 12/1 and on warfarin and aspirin with INR of 2.0 today. He was sitting up in the chair. Around [phone], called his RN and reported that he could not feel his R side. Was also noted to be satting in 64s and tachypneic during this. Was transferred to bed, noted to be diffusely weak and a stroke code was activated. Exam had improved by the time I arrived. Reported subjective tingling in BL upper extremities, tachypneic on my arrival. Noted to have waxing and waning weakness that is generalized. mRS: 3 tNKAse: not offered, he is on warfarin with INR today of 2.0(subtherapeutic for his valve) Thrombectomy: Not offered, too mild to treat.   INTERVAL HISTORY Patient is sitting at the side of the bed.  He reports that his symptoms started when he had his IJ removed yesterday.  Originally he had weakness and numbness in his bilateral upper extremities as well as numbness in his right side. He says that he is still having trouble with coordination in his left hand, however his generalized symptoms have improved.  Overall, he states that he feels okay today and he has been able to ambulate in his room. MRI scan shows tiny right frontal punctate possible subacute infarct.  Remote a left brainstem hemorrhage.  MR angiogram was possible as to small right cervical carotid aneurysms.  Echocardiogram shows severely diminished left ventricular ejection fraction.  INR is 2.2 yes sure [place] Prior stroke therapies Plan for TCD with bubble study tomorrow  OBJECTIVE Vitals:   02/22/21 1925 02/22/21 2316 02/23/21 0259 02/23/21 0800  BP: 111/74 104/70  114/75  Pulse: 75 76  83  Resp: (!) 30 20  (!) 21  Temp: 99.1 F  (37.3 C) 98.2 F (36.8 C)  98 F (36.7 C)  TempSrc: Oral Oral  Oral  SpO2: 95% 98%  90%  Weight:   70.1 kg   Height:        CBC:  Recent Labs  Lab 02/21/21 0500 02/22/21 0305  WBC 13.2* 13.5*  HGB 8.1* 8.8*  HCT 26.7* 30.3*  MCV 81.7 81.9  PLT 153 [id number]    Basic Metabolic Panel:  Recent Labs  Lab 02/18/21 0158 02/18/21 0312 02/18/21 1721 02/18/21 2137 02/22/21 0305 02/23/21 0537  NA 134*   < >  --    < > 134* 133*  K 5.7*   < > 5.1   < > 4.4 4.3  CL 102   < >  --    < > 101 98  CO2 24   < >  --    < > 24 25  GLUCOSE 127*   < >  --    < > 79 84  BUN 24*   < >  --    < > 15 15  CREATININE 1.27*   < >  --    < > 0.92 0.80  CALCIUM 8.5*   < >  --    < > 8.3* 8.6*  MG 3.0*  --  2.1  --   --   --    < > = values in this interval not displayed.    Lipid Panel:     Component  Value Date/Time   CHOL 79 02/23/2021 0537   TRIG 80 02/23/2021 0537   HDL 25 (L) 02/23/2021 0537   CHOLHDL 3.2 02/23/2021 0537   VLDL 16 02/23/2021 0537   LDLCALC 38 02/23/2021 0537   HgbA1c:  Lab Results  Component Value Date   HGBA1C 4.7 (L) 02/23/2021   Urine Drug Screen: No results found for: LABOPIA, COCAINSCRNUR, LABBENZ, AMPHETMU, THCU, LABBARB  Alcohol Level No results found for: ETH  IMAGING   MR ANGIO HEAD WO CONTRAST  Addendum Date: 02/22/2021   ADDENDUM REPORT: 02/22/2021 11:31 ADDENDUM: CORRECTION: The focus of mild dwi hyperintensity and adjacent susceptibility artifact described in the left midbrain in the report is actually at the left pontomedulllary junction. Electronically Signed   By: Feliberto Harts M.D.   On: 02/22/2021 11:31   Addendum Date: 02/22/2021   ADDENDUM REPORT: 02/22/2021 11:03 ADDENDUM: Impression #2 under MRA neck should be " Potentially moderate stenosis of the proximal RIGHT ICA, although tortuosity limits assessment." Electronically Signed   By: Feliberto Harts M.D.   On: 02/22/2021 11:03   Result Date: 02/22/2021 EXAM: MRI HEAD WITHOUT CONTRAST  MRA HEAD WITHOUT CONTRAST MRA OF THE NECK WITHOUT AND WITH CONTRAST TECHNIQUE: Multiplanar, multi-echo pulse sequences of the brain and surrounding structures were acquired without intravenous contrast. Angiographic images of the Circle of Willis were acquired using MRA technique without intravenous contrast. Angiographic images of the neck were acquired using MRA technique without and with intravenous contrast. Carotid stenosis measurements (when applicable) are obtained utilizing NASCET criteria, using the distal internal carotid diameter as the denominator. COMPARISON:  No pertinent prior exam. FINDINGS: MR HEAD FINDINGS Brain: Small acute or early subacute infarct in the high right frontal white matter (series 2, image 34). Small focus of DWI hyperintensity in the left midbrain without ADC correlate. Of note, there is an adjacent focus susceptibility artifact, likely prior hemorrhage. Remote lacunar cerebellar infarcts bilaterally. Prior infarcts in the right frontal and parietal cortex with associated encephalomalacia and gliosis. Additional to moderate scattered T2/FLAIR hyperintensities in the white matter, nonspecific but compatible with chronic microvascular ischemic disease. No hydrocephalus, mass lesion, midline shift, or extra-axial fluid collection. Mild atrophy. Vascular: See below. Skull and upper cervical spine: Normal marrow signal. Sinuses/Orbits: Sinuses are largely clear.  Unremarkable orbits. Other: Small left mastoid effusion. MRA HEAD FINDINGS Anterior circulation: Bilateral intracranial ICAs MCAs, and ACAs are patent without proximal hemodynamically significant stenosis. No aneurysm identified. Posterior circulation: Bilateral intradural vertebral arteries are patent. Small, but patent basilar artery with bilateral posterior communicating arteries and small P1 PCAs, anatomic variant. Multifocal superimposed mild atherosclerotic narrowing of the basilar artery. Bilateral posterior cerebral  arteries are patent without proximal hemodynamically significant stenosis Anatomic variants:Detailed above. MRA NECK FINDINGS Aortic arch: Limited evaluation due to artifact. Great vessel origins are patent. Right carotid system: Patent. Suspected mild to moderate stenosis of the proximal right ICA, although marked tortuosity in this region limits evaluation. 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 Both aneurysms have wide necks (measuring approximately 6 mm in diameter) and are superiorly directed. Left carotid system: Patent. Mild stenosis the proximal ICA. Approximately 2-3 mm outpouching rising from the anterolateral left ICA (see series 75643, image 160). Tortuous ICA at the skull base. Vertebral arteries: Co dominant. Limited evaluation proximally. No visible high-grade stenosis. Mildly tortuous bilaterally. Other: None. IMPRESSION: MRI: 1. Small acute or early subacute infarct in the high right frontal white matter (  series 2, image 34). 2. Small focus of mild DWI hyperintensity in the left midbrain is favored artifactual (over acute/subacute infarct) given no correlate ADC hypointensity and given adjacent susceptibility artifact (likely a prior hemorrhage). 3. Remote right frontoparietal and cerebellar infarcts. 4. Mild-to-moderate chronic microvascular disease. MRA head: 1. No large vessel occlusion or proximal hemodynamically significant stenosis. 2. 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 MRA neck: 1. 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 2. Potentially moderate stenosis of the proximal ICA, although tortuosity limits assessment. 3. If the patient is able, CTA or catheter arteriogram could further evaluate the above findings. Preliminary  findings discussed with Dr. Milas Gain via telephone at 9:40 a.m. Findings of aneurysms conveyed via text page to Dr. Milas Gain at 10:49 AM. Electronically Signed: By: Margaretha Sheffield M.D. On: 02/22/2021 10:57   MR ANGIO NECK W WO CONTRAST  Addendum Date: 02/22/2021   ADDENDUM REPORT: 02/22/2021 11:31 ADDENDUM: CORRECTION: The focus of mild dwi hyperintensity and adjacent susceptibility artifact described in the left midbrain in the report is actually at the left pontomedulllary junction. Electronically Signed   By: Margaretha Sheffield M.D.   On: 02/22/2021 11:31   Addendum Date: 02/22/2021   ADDENDUM REPORT: 02/22/2021 11:03 ADDENDUM: Impression #2 under MRA neck should be " Potentially moderate stenosis of the proximal RIGHT ICA, although tortuosity limits assessment." Electronically Signed   By: Margaretha Sheffield M.D.   On: 02/22/2021 11:03   Result Date: 02/22/2021 EXAM: MRI HEAD WITHOUT CONTRAST MRA HEAD WITHOUT CONTRAST MRA OF THE NECK WITHOUT AND WITH CONTRAST TECHNIQUE: Multiplanar, multi-echo pulse sequences of the brain and surrounding structures were acquired without intravenous contrast. Angiographic images of the Circle of Willis were acquired using MRA technique without intravenous contrast. Angiographic images of the neck were acquired using MRA technique without and with intravenous contrast. Carotid stenosis measurements (when applicable) are obtained utilizing NASCET criteria, using the distal internal carotid diameter as the denominator. COMPARISON:  No pertinent prior exam. FINDINGS: MR HEAD FINDINGS Brain: Small acute or early subacute infarct in the high right frontal white matter (series 2, image 34). Small focus of DWI hyperintensity in the left midbrain without ADC correlate. Of note, there is an adjacent focus susceptibility artifact, likely prior hemorrhage. Remote lacunar cerebellar infarcts bilaterally. Prior infarcts in the right frontal and parietal cortex with associated encephalomalacia  and gliosis. Additional to moderate scattered T2/FLAIR hyperintensities in the white matter, nonspecific but compatible with chronic microvascular ischemic disease. No hydrocephalus, mass lesion, midline shift, or extra-axial fluid collection. Mild atrophy. Vascular: See below. Skull and upper cervical spine: Normal marrow signal. Sinuses/Orbits: Sinuses are largely clear.  Unremarkable orbits. Other: Small left mastoid effusion. MRA HEAD FINDINGS Anterior circulation: Bilateral intracranial ICAs MCAs, and ACAs are patent without proximal hemodynamically significant stenosis. No aneurysm identified. Posterior circulation: Bilateral intradural vertebral arteries are patent. Small, but patent basilar artery with bilateral posterior communicating arteries and small P1 PCAs, anatomic variant. Multifocal superimposed mild atherosclerotic narrowing of the basilar artery. Bilateral posterior cerebral arteries are patent without proximal hemodynamically significant stenosis Anatomic variants:Detailed above. MRA NECK FINDINGS Aortic arch: Limited evaluation due to artifact. Great vessel origins are patent. Right carotid system: Patent. Suspected mild to moderate stenosis of the proximal right ICA, although marked tortuosity in this region limits evaluation. 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 Both aneurysms have wide necks (measuring approximately 6 mm in diameter) and are superiorly directed. Left carotid system: Patent. Mild stenosis the proximal ICA. Approximately 2-3 mm outpouching rising from the anterolateral left ICA (see series 37048, image 160). Tortuous ICA at the skull base. Vertebral arteries: Co dominant. Limited evaluation proximally. No visible high-grade stenosis. Mildly tortuous bilaterally. Other: None. IMPRESSION: MRI: 1. Small acute or early subacute infarct in the high right frontal white matter (series 2, image 34). 2. Small focus  of mild DWI hyperintensity in the left midbrain is favored artifactual (over acute/subacute infarct) given no correlate ADC hypointensity and given adjacent susceptibility artifact (likely a prior hemorrhage). 3. Remote right frontoparietal and cerebellar infarcts. 4. Mild-to-moderate chronic microvascular disease. MRA head: 1. No large vessel occlusion or proximal hemodynamically significant stenosis. 2. 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 MRA neck: 1. 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 2. Potentially moderate stenosis of the proximal ICA, although tortuosity limits assessment. 3. If the patient is able, CTA or catheter arteriogram could further evaluate the above findings. Preliminary findings discussed with Dr. Ezzie Dural via telephone at 9:40 a.m. Findings of aneurysms conveyed via text page to Dr. Ezzie Dural at 10:49 AM. Electronically Signed: By: Feliberto Harts M.D. On: 02/22/2021 10:57   MR BRAIN WO CONTRAST  Addendum Date: 02/22/2021   ADDENDUM REPORT: 02/22/2021 11:31 ADDENDUM: CORRECTION: The focus of mild dwi hyperintensity and adjacent susceptibility artifact described in the left midbrain in the report is actually at the left pontomedulllary junction. Electronically Signed   By: Feliberto Harts M.D.   On: 02/22/2021 11:31   Addendum Date: 02/22/2021   ADDENDUM REPORT: 02/22/2021 11:03 ADDENDUM: Impression #2 under MRA neck should be " Potentially moderate stenosis of the proximal RIGHT ICA, although tortuosity limits assessment." Electronically Signed   By: Feliberto Harts M.D.   On: 02/22/2021 11:03   Result Date: 02/22/2021 EXAM: MRI HEAD WITHOUT CONTRAST MRA HEAD WITHOUT CONTRAST MRA OF THE NECK WITHOUT AND WITH CONTRAST TECHNIQUE: Multiplanar, multi-echo pulse sequences of the brain and  surrounding structures were acquired without intravenous contrast. Angiographic images of the Circle of Willis were acquired using MRA technique without intravenous contrast. Angiographic images of the neck were acquired using MRA technique without and with intravenous contrast. Carotid stenosis measurements (when applicable) are obtained utilizing NASCET criteria, using the distal internal carotid diameter as the denominator. COMPARISON:  No pertinent prior exam. FINDINGS: MR HEAD FINDINGS Brain: Small acute or early subacute infarct in the high right frontal white matter (series 2, image 34). Small focus of DWI hyperintensity in the left midbrain without ADC correlate. Of note, there is an adjacent focus susceptibility artifact, likely prior hemorrhage. Remote lacunar cerebellar infarcts bilaterally. Prior infarcts in the right frontal and parietal cortex with associated encephalomalacia and gliosis. Additional to moderate scattered T2/FLAIR hyperintensities in the white matter, nonspecific but compatible with chronic microvascular ischemic disease. No hydrocephalus, mass lesion, midline shift, or extra-axial fluid collection. Mild atrophy. Vascular: See below. Skull and upper cervical spine: Normal marrow signal. Sinuses/Orbits: Sinuses are largely clear.  Unremarkable orbits. Other: Small left mastoid effusion. MRA HEAD FINDINGS Anterior circulation: Bilateral intracranial ICAs MCAs, and ACAs are patent without proximal hemodynamically significant stenosis. No aneurysm identified. Posterior circulation: Bilateral intradural vertebral arteries are patent. Small, but patent basilar artery  with bilateral posterior communicating arteries and small P1 PCAs, anatomic variant. Multifocal superimposed mild atherosclerotic narrowing of the basilar artery. Bilateral posterior cerebral arteries are patent without proximal hemodynamically significant stenosis Anatomic variants:Detailed above. MRA NECK FINDINGS Aortic arch:  Limited evaluation due to artifact. Great vessel origins are patent. Right carotid system: Patent. Suspected mild to moderate stenosis of the proximal right ICA, although marked tortuosity in this region limits evaluation. 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 Both aneurysms have wide necks (measuring approximately 6 mm in diameter) and are superiorly directed. Left carotid system: Patent. Mild stenosis the proximal ICA. Approximately 2-3 mm outpouching rising from the anterolateral left ICA (see series 60153, image 160). Tortuous ICA at the skull base. Vertebral arteries: Co dominant. Limited evaluation proximally. No visible high-grade stenosis. Mildly tortuous bilaterally. Other: None. IMPRESSION: MRI: 1. Small acute or early subacute infarct in the high right frontal white matter (series 2, image 34). 2. Small focus of mild DWI hyperintensity in the left midbrain is favored artifactual (over acute/subacute infarct) given no correlate ADC hypointensity and given adjacent susceptibility artifact (likely a prior hemorrhage). 3. Remote right frontoparietal and cerebellar infarcts. 4. Mild-to-moderate chronic microvascular disease. MRA head: 1. No large vessel occlusion or proximal hemodynamically significant stenosis. 2. 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 MRA neck: 1. 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 2. Potentially moderate stenosis of the proximal ICA, although tortuosity limits assessment. 3. If the patient is able, CTA or catheter arteriogram could further evaluate the above findings. Preliminary findings discussed with Dr. Milas Gain via telephone at 9:40 a.m. Findings of aneurysms conveyed via text page to Dr. Milas Gain at 10:49 AM.  Electronically Signed: By: Margaretha Sheffield M.D. On: 02/22/2021 10:57   CT HEAD CODE STROKE WO CONTRAST  Result Date: 02/22/2021 CLINICAL DATA:  Code stroke.  Neuro deficit, acute, stroke suspected EXAM: CT HEAD WITHOUT CONTRAST TECHNIQUE: Contiguous axial images were obtained from the base of the skull through the vertex without intravenous contrast. COMPARISON:  Multiple priors, Most recent February 02, 2021. FINDINGS: Brain: New hypodensity in the high right frontal cortex, anterior to areas of remote infarct (for example series 3, image 26/27). More posteriorly, there are similar remote infarcts. No evidence of acute hemorrhage. No hydrocephalus. No mass lesion or midline shift. No visible extra-axial fluid collection. Vascular: No hyperdense vessel identified. Calcific intracranial atherosclerosis. Skull: No acute fracture. Sinuses/Orbits: Clear sinuses.  Unremarkable orbits. Other: No mastoid effusions. ASPECTS [facility] Stroke Program Early CT Score) total score (0-10 with 10 being normal): 9 at worst IMPRESSION: 1. New hypodensity in the high right frontal cortex, anterior to areas of remote infarct. This could be artifactual given streak artifact in this region; however, acute infarct is not excluded. An MRI could further evaluate if the patient is able. ASPECTS is 9 if this finding is real. 2. No acute hemorrhage. 3. 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 Findings discussed with Dr. Milas Gain via telephone at 9:24 a.m. Electronically Signed   By: Margaretha Sheffield M.D.   On: 02/22/2021 09:43     Transthoracic Echocardiogram  02/19/21 IMPRESSIONS   1. LVEF is depressed with severe hypokinesis/akinesis of the lateral  wall, apex, inferior wall. The anterior wall is hypokinestic. COmpared to  previous echo preop,  wall motion changes are prominent . Left ventricular  ejection fraction, by estimation, is   25 to 30%. The left ventricle has severely decreased function.   2.  Right ventricular systolic function is moderately reduced. The right  ventricular size is normal. There is severely elevated pulmonary artery  systolic pressure.   3. 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 Peak and  mean gradients through the valve are 15 and 6 mm Hg respecitvely . The  mitral valve has been repaired/replaced.   4. The aortic valve is tricuspid. Aortic valve regurgitation is mild to  moderate. Aortic valve sclerosis is present, with no evidence of aortic  valve stenosis.   5. The inferior vena cava is dilated in size with <50% respiratory  variability, suggesting right atrial pressure of 15 mmHg.   ECG - SR rate 64 BPM. (See cardiology reading for complete details)  PHYSICAL EXAM Blood pressure 114/75, pulse 83, temperature 98 F (36.7 C), temperature source Oral, resp. rate (!) 21, height 5\' 11"  (1.803 m), weight 70.1 kg, SpO2 90 %.  Temp:  [98 F (36.7 C)-99.1 F (37.3 C)] 98 F (36.7 C) (12/07 0800) Pulse Rate:  [75-93] 83 (12/07 0800) Resp:  [15-30] 21 (12/07 0800) BP: (88-114)/(58-77) 114/75 (12/07 0800) SpO2:  [90 %-98 %] 90 % (12/07 0800) Weight:  [70.1 kg] 70.1 kg (12/07 0259)  General -Caucasian male, thin, pale.  No apparent distress  Ophthalmologic - fundi not visualized due to noncooperation.  Cardiovascular - Regular rhythm and rate.  Mental Status -  Level of arousal and orientation to time, place, and person were intact. Language including expression, naming, repetition, comprehension was assessed and found intact. Attention span and concentration were normal. Recent and remote memory were intact. Fund of Knowledge was assessed and was intact.  Cranial Nerves II - XII - II - Visual field intact OU. III, IV, VI - Extraocular movements intact. V - Facial sensation intact bilaterally. VII - Facial movement intact bilaterally. VIII - Hearing & vestibular intact bilaterally. X - Palate elevates symmetrically. XI - Chin  turning & shoulder shrug intact bilaterally. XII - Tongue protrusion intact.  Motor Strength - Bulk poor and fasciculations were absent.  Diminished fine motor control left more than right.  LUE 3+/5 distally in the grip and fingers but good strength proximally., RUE 4/5, lower extremities 5/5  Motor Tone - Muscle tone was assessed at the neck and appendages and was normal.  Reflexes - The patient's reflexes were symmetrical in all extremities and he had no pathological reflexes.  Sensory - Light touch, temperature/pinprick were assessed and were symmetrical.    Coordination - The patient had normal movements in the hands and feet with no ataxia or dysmetria.  Tremor was absent.  Gait and Station - deferred.   ASSESSMENT/PLAN Mr. Mark Garrett is a 40 y.o. male with history of CAD s/p CABG, ischemic cardiomyopathy with severe mitral valve regurgitation, ongoing tobacco use, COPD, hx of DVTs admitted with SOB + Chest pain with recent mobile vegetation on TEE s/p MV replacement on 12/1 - warfarin and aspirin with INR of 2.0 presenting with right sided numbness, sats in 80's and generalized weakness.  He did not receive IV t-PA due to improving deficits and warfarin therapy.  Stroke: multiple infarcts - cardio-embolic in setting of severe global hypokinesis with optimal anticoagulation with warfarin Resultant mild left hand weakness Code Stroke CT Head - New hypodensity in the high right frontal cortex, anterior to areas of  remote infarct. This could be artifactual given streak artifact in this region; however, acute infarct is not excluded. An MRI could further evaluate if the patient is able. ASPECTS is 9 if this finding is real. No acute hemorrhage. 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 CT head - not ordered MRI head - Small acute or early subacute infarct in the high right frontal white matter (series 2, image 34). Small focus of mild DWI hyperintensity in the  left midbrain is favored artifactual (over acute/subacute infarct) given no correlate. ADC hypointensity and given adjacent susceptibility artifact (likely a prior hemorrhage). Remote right frontoparietal and cerebellar infarcts. Mild-to-moderate chronic microvascular disease. MRA head - No large vessel occlusion or proximal hemodynamically significant stenosis. 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 Addendum - The focus of mild dwi hyperintensity and adjacent susceptibility artifact described in the left midbrain in the report is actually at the left pontomedulllary junction. 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 Potentially moderate stenosis of the proximal ICA, although tortuosity limits assessment. If the patient is able, CTA or catheter arteriogram could further evaluate the above findings. ADDENDUM: Impression #2 under MRA neck should be " Potentially moderate stenosis of the proximal RIGHT ICA, although tortuosity limits assessment." CTA Neck - pending CT Perfusion - not ordered Carotid Doppler - MRA neck ordered - carotid dopplers not indicated 2D Echo - 02/19/21 - EF 25 - 30%. No cardiac source of emboli identified.  LDL - 38 HgbA1c - 4.7 TCD with bubble study-pending UDS - not ordered VTE prophylaxis - warfarin Diet  Diet Order             DIET DYS 3 Room service appropriate? Yes; Fluid consistency: Thin  Diet effective now                  Xarelto (rivaroxaban) daily prior to admission, now on aspirin 81 mg daily and warfarin daily Patient will be counseled to be compliant with his antithrombotic medications Ongoing aggressive stroke risk factor management Therapy recommendations:  pending Disposition:  Pending  Hypertension Home BP meds: none  Current BP meds:  none  Stable (BP runs low due to cardiomyopathy) Permissive hypertension (OK if < 220/120) but gradually normalize in 5-7 days  Long-term BP goal normotensive        Hyperlipidemia Home Lipid lowering medication: Crestor 40 mg daily LDL 38, goal < 70 Current lipid lowering medication: Crestor 40 mg daily Continue statin at discharge  Other Stroke Risk Factors Cigarette smoker - advised to stop smoking ETOH use, advised to drink no more than 1 alcoholic beverage per day. 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 MD to update note as needed.   Janine Ores, DNP, FNP-BC [facility] Pager: [phone]  STROKE MD NOTE : I have personally obtained history,examined this patient, reviewed notes, independently viewed imaging studies, participated in medical decision making and plan  of care.ROS completed by me personally and pertinent positives fully documented  I have made any additions or clarifications directly to the above note. Agree with note above.  Patient presented with symptoms of generalized weakness with left hand weakness and MRI scan shows tiny subacute right frontal infarct and possibly midbrain infarct versus artifact.  Etiology likely cardioembolic given severe global hypokinesis.  Continue anticoagulation with INR goal between 2 and 3 and aggressive risk factor modification.  Patient's symptoms began after right jugular central line was discontinued raising concern for paradoxical embolism and recommend check TCD bubble study for  right-to-left shunt.  Greater than 50% time during this 35-minute visit were spent on counseling and coordination of care about his strokes and discussion about evaluation prevention and treatment Questions  Antony Contras, MD Medical Director [facility] Pager: [phone] 02/23/2021 2:15 PM  To contact Stroke Continuity provider, please refer to [email or web address] After hours, contact General Neurology

## 2021-02-23 NOTE — Plan of Care (Signed)
  Problem: Education: Goal: Knowledge of General Education information will improve Description: Including pain rating scale, medication(s)/side effects and non-pharmacologic comfort measures Outcome: Progressing   Problem: Health Behavior/Discharge Planning: Goal: Ability to manage health-related needs will improve Outcome: Progressing   Problem: Clinical Measurements: Goal: Ability to maintain clinical measurements within normal limits will improve Outcome: Progressing Goal: Will remain free from infection Outcome: Progressing Goal: Diagnostic test results will improve Outcome: Progressing Goal: Respiratory complications will improve Outcome: Progressing Goal: Cardiovascular complication will be avoided Outcome: Progressing   Problem: Activity: Goal: Risk for activity intolerance will decrease Outcome: Progressing   Problem: Nutrition: Goal: Adequate nutrition will be maintained Outcome: Progressing   Problem: Coping: Goal: Level of anxiety will decrease Outcome: Progressing   Problem: Elimination: Goal: Will not experience complications related to bowel motility Outcome: Progressing Goal: Will not experience complications related to urinary retention Outcome: Progressing   Problem: Pain Managment: Goal: General experience of comfort will improve Outcome: Progressing   Problem: Safety: Goal: Ability to remain free from injury will improve Outcome: Progressing   Problem: Skin Integrity: Goal: Risk for impaired skin integrity will decrease Outcome: Progressing   Problem: Education: Goal: Understanding of CV disease, CV risk reduction, and recovery process will improve Outcome: Progressing Goal: Individualized Educational Video(s) Outcome: Progressing   Problem: Activity: Goal: Ability to return to baseline activity level will improve Outcome: Progressing   Problem: Cardiovascular: Goal: Ability to achieve and maintain adequate cardiovascular perfusion  will improve Outcome: Progressing Goal: Vascular access site(s) Level 0-1 will be maintained Outcome: Progressing   Problem: Health Behavior/Discharge Planning: Goal: Ability to safely manage health-related needs after discharge will improve Outcome: Progressing   Problem: Education: Goal: Ability to demonstrate management of disease process will improve Outcome: Progressing Goal: Ability to verbalize understanding of medication therapies will improve Outcome: Progressing Goal: Individualized Educational Video(s) Outcome: Progressing   Problem: Activity: Goal: Capacity to carry out activities will improve Outcome: Progressing   Problem: Cardiac: Goal: Ability to achieve and maintain adequate cardiopulmonary perfusion will improve Outcome: Progressing   Problem: Education: Goal: Knowledge of secondary prevention will improve (SELECT ALL) Outcome: Progressing Goal: Knowledge of patient specific risk factors will improve (INDIVIDUALIZE FOR PATIENT) Outcome: Progressing

## 2021-02-23 NOTE — Progress Notes (Addendum)
[address]       [place] [address]             [phone]        6 Days Post-Op Procedure(s) (LRB): REDO STERNOTOMY (N/A) MITRAL VALVE (MV) REPLACEMENT USING ON-X 27/29 MM PROSTHETIC MITRAL VALVE (N/A) CORONARY ARTERY BYPASS GRAFTING (CABG), ON PUMP, TIMES ONE, USING ENDOSCOPICALLY HARVESTED RIGHT GREATER SAPHENOUS VEIN (N/A) TRANSESOPHAGEAL ECHOCARDIOGRAM (TEE) (N/A) APPLICATION OF CELL SAVER  Subjective: Events of yesterday am noted. Patient ate breakfast and states he has no trouble swallowing. He was drinking coffee as I entered the room.  Objective: Vital signs in last 24 hours: Temp:  [98 F (36.7 C)-99.1 F (37.3 C)] 98.2 F (36.8 C) (12/06 2316) Pulse Rate:  [38-93] 76 (12/06 2316) Cardiac Rhythm: Normal sinus rhythm;Heart block;Bundle branch block (12/06 1900) Resp:  [15-30] 20 (12/06 2316) BP: (88-150)/(58-116) 104/70 (12/06 2316) SpO2:  [94 %-100 %] 98 % (12/06 2316) Weight:  [70.1 kg] 70.1 kg (12/07 0259)  Pre op weight  70.2 kg Current Weight  02/23/21 70.1 kg    Hemodynamic parameters for last 24 hours: CVP:  [14 mmHg-17 mmHg] 16 mmHg  Intake/Output from previous day: 12/06 0701 - 12/07 0700 In: 974.2 [P.O.:840; I.V.:34.2; IV Piggyback:100] Out: 4675 [Urine:4675]   Physical Exam:  Cardiovascular: RRR, sharp valve click Pulmonary: Crackles Abdomen: Soft, non tender, bowel sounds present. Extremities: No lower extremity edema. Partal left foot amputation Wound: Clean and dry.  No erythema or signs of infection.  Lab Results: CBC: Recent Labs    02/21/21 0500 02/22/21 0305  WBC 13.2* 13.5*  HGB 8.1* 8.8*  HCT 26.7* 30.3*  PLT 153 191   BMET:  Recent Labs    02/22/21 0305 02/23/21 0537  NA 134* 133*  K 4.4 4.3  CL 101 98  CO2 24 25  GLUCOSE 79 84  BUN 15 15  CREATININE 0.92 0.80  CALCIUM 8.3* 8.6*    PT/INR:  Lab Results  Component Value Date   INR 2.2 (H) 02/23/2021   INR 2.0 (H) 02/22/2021    INR 2.2 (H) 02/21/2021   ABG:  INR: Will add last result for INR, ABG once components are confirmed Will add last 4 CBG results once components are confirmed  Assessment/Plan:  1. CV - SR, first degree heart block. On Coumadin. INR this am slightly increased from 2 to 2.2. Co ox this am decreased to 53.7; Milrinone stopped 12/06.Marland Kitchen PAH-on Sildenafil 40 mg tid 2.  Pulmonary - History of COPD. On room air. Encourage incentive spirometer. 3. Acute systolic CHF-on Spironolactone 25 mg daily 4.  Expected post op acute blood loss anemia - Last H and H stable at 8.8 and 30.3 5. Neuro-events of yesterday may have been TIA or ?hypoxia. 6. 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 In terms of discharge, patient requires a total of 6 weeks of IV antibiotics. He states he has an aunt he can stay with.Unfortunately, he does not have insurance so doubtful home antibiotics can be arranged.  Donielle M ZimmermanPA-C 02/23/2021,7:00 AM    Chart reviewed, patient examined, agree with above. It is not clear what caused his neuro changes yesterday but nothing on MRI brain to explain his symptoms. There was small right frontal white matter stroke which appeared subacute and could have been related to emboli from his mitral endocarditis. He seems back to normal now and it could have been TIA related to vascular disease and drop  in BP. I held his Entresto last pm due to SBP 90's after large diuresis yesterday. His wt is back to baseline and CVP this am is 6-7.  INR increased a little to 2.2 so will continue 2.5 mg Coumadin daily for now.

## 2021-02-23 NOTE — Progress Notes (Signed)
CARDIAC REHAB PHASE I   PRE:  Rate/Rhythm: 82 SR  BP:  Sitting: 105/68      SaO2: 95 RA  MODE:  Ambulation: 230 ft   POST:  Rate/Rhythm: 90 SR  BP:  Sitting: 134/93    SaO2: 97 RA   Pt ambulated 2109ft in hallway assist of one with EVA. Pt still c/o L handed weakness and lack of coordination. Pt returned to bed. Encouraged continued ambulation and IS use throughout the day. Will continue to follow.  [id number] Mark Bowen, RN BSN 02/23/2021 10:40 AM

## 2021-02-23 NOTE — TOC CM/SW Note (Addendum)
1242 pm HF TOC CM spoke to pt's Toribio Harbour, she is agreeable to pt staying with her after dc. Explained we are working on DME, hospital bed, rolling walker with seat and bedside commode. States she is working on cleaning area for hospital bed. Pt will need [facility] transportation to her home. Address [address] [place] [address]. Isidoro Donning RN3 CCM, Heart Failure TOC CM [phone]   HF TOC CM attempted call to pt's Toribio Harbour, left message for return call. Isidoro Donning RN3 CCM, Heart Failure TOC CM [phone]

## 2021-02-23 NOTE — TOC Progression Note (Signed)
Transition of Care [facility]) - Progression Note    Patient Details  Name: MOMEN HAM MRN: [id number] Date of Birth: Jul 28, 1980  [facility] CM/SW Contact  Mikaylah Libbey, LCSWA Phone Number: 02/23/2021, 4:38 PM  Clinical Narrative:    HF CSW submitted LOG to [facility] Supervisor for approval for hospital bed rental for 30 days from [employer] for $395.29. Pending plan for needed IV Abx for 6 weeks with Mr. Tankard having no health insurance. [employer] and [facility] notified.  CSW will continue to follow throughout discharge.   Expected Discharge Plan: Home/Self Care Barriers to Discharge: Continued Medical Work up  Expected Discharge Plan and Services Expected Discharge Plan: Home/Self Care In-house Referral: Clinical Social Work Discharge Planning Services: CM Consult, Indigent Health Clinic, MATCH Program, Medication Assistance, Follow-up appt scheduled   Living arrangements for the past 2 months: Single Family Home                   DME Agency: NA       HH Arranged: NA           Social Determinants of Health (SDOH) Interventions Food Insecurity Interventions: Other (Comment) (Patient has food stamps $250 a month) Financial Strain Interventions: Intervention Not Indicated, Artist (referral to [employer] to screen for Medicaid, patient agreeable) Housing Interventions: Intervention Not Indicated Transportation Interventions: Retail banker  Readmission Risk Interventions No flowsheet data found.  Joaquim Tolen, MSW, LCSWA [phone] Heart Failure Social Worker

## 2021-02-23 NOTE — Plan of Care (Signed)

## 2021-02-23 NOTE — TOC CAGE-AID Note (Signed)
Transition of Care [facility]) - CAGE-AID Screening   Patient Details  Name: Mark Garrett MRN: [id number] Date of Birth: 05-24-1980  Transition of Care [facility]) CM/SW Contact:    Jasalyn Frysinger C Tarpley-Carter, LCSWA Phone Number: 02/23/2021, 9:34 AM   Clinical Narrative: Pt participated in Cage-Aid.  Pt stated he does use substance, not ETOH.  Pt was offered resources, due to usage of substance or ETOH.  Khris Jansson Tarpley-Carter, MSW, LCSW-A Pronouns:  She/Her/Hers [facility] Clinical Social Worker Direct Number:  [phone] [email or web address]       CAGE-AID Screening:    Have You Ever Felt You Ought to Cut Down on Your Drinking or Drug Use?: No Have People Annoyed You By [employer] Your Drinking Or Drug Use?: No Have You Felt Bad Or Guilty About Your Drinking Or Drug Use?: No Have You Ever Had a Drink or Used Drugs First Thing In The Morning to Steady Your Nerves or to Get Rid of a Hangover?: No CAGE-AID Score: 0  Substance Abuse Education Offered: Yes  Substance abuse interventions: Transport planner

## 2021-02-23 NOTE — TOC CM/SW Note (Addendum)
240 pm Contacted Zach, [employer] rep for cost for 30 day LOG for hospital bed. States pt will also need rollator and 3n1 bedside commode. [employer] will use charity for walker and bsc. Will submit LOG to [facility] Supervisor for approval for hospital bed. Isidoro Donning RN3 CCM, Heart Failure TOC CM [phone]   HF TOC CM contacted Pam, [employer] IV infusion coordinator for Home IV abx at home under charity program. [facility], [facility]rippe for [facility] IV abx under charity. Pt Medicaid is pending. Attending updated, will give agencies an anticipated dc date. Isidoro Donning RN3 CCM, Heart Failure TOC CM [phone]

## 2021-02-23 NOTE — Plan of Care (Signed)
  Problem: Education: Goal: Knowledge of General Education information will improve Description: Including pain rating scale, medication(s)/side effects and non-pharmacologic comfort measures Outcome: Progressing   Problem: Activity: Goal: Risk for activity intolerance will decrease Outcome: Progressing   Problem: Nutrition: Goal: Adequate nutrition will be maintained Outcome: Progressing   Problem: Coping: Goal: Level of anxiety will decrease Outcome: Progressing   Problem: Pain Managment: Goal: General experience of comfort will improve Outcome: Progressing   Problem: Education: Goal: Knowledge of secondary prevention will improve (SELECT ALL) Outcome: Progressing

## 2021-02-23 NOTE — Progress Notes (Signed)
Occupational Therapy Treatment/Re-evaluation Patient Details Name: Mark Garrett MRN: [id number] DOB: 1981-03-15 Today's Date: 02/23/2021   History of present illness Pt is a 40 y.o. male admitted 01/27/21 due to CHF exacerbation. S/p R/L heart cath and coronary graft angiography on 11/14. 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 MVR and CABG x 1 on 12/1. PMH includes CHF, CAD (s/p CABG 2006), mitral valve disease, traumatic amputation of L foot.   OT comments  Pt seen for first OT session since MVR/CABG with pt able to mobilize in room without AD at Supervision. Educated on sternal precautions for ADLs, IADLs and general body mechanics with pt verbalizing understanding. Pt able to complete ADLs with no more than Supervision though limited in efficiency d/t new coordination deficits of B hands (L>R). Pt reports these symptoms occurred yesterday s/p IJ access removal. Began education on fine motor coordination exercises with plans to further address in next sessions if symptoms persist. Pt reports may be planning to DC to aunt's apartment in [place]. Anticipate good progress towards independence with no OT needs at DC.    Recommendations for follow up therapy are one component of a multi-disciplinary discharge planning process, led by the attending physician.  Recommendations may be updated based on patient status, additional functional criteria and insurance authorization.    Follow Up Recommendations  No OT follow up    Assistance Recommended at Discharge PRN  Equipment Recommendations  None recommended by OT    Recommendations for Other Services      Precautions / Restrictions Precautions Precautions: Fall;Sternal;Other (comment) Precaution Comments: h/o L forefoot amputation (does not have prosthetic in room; ambulates with/without it at home) Restrictions Weight Bearing Restrictions: No       Mobility Bed Mobility Overal bed  mobility: Modified Independent Bed Mobility: Supine to Sit;Sit to Supine     Supine to sit: Modified independent (Device/Increase time) Sit to supine: Modified independent (Device/Increase time)   General bed mobility comments: light use of bedrail, cues for sternal precautions    Transfers Overall transfer level: Needs assistance Equipment used: None Transfers: Sit to/from Stand Sit to Stand: Supervision           General transfer comment: educated on hand placement for sit to stands with pt able to return demo without DME use     Balance Overall balance assessment: Needs assistance Sitting-balance support: No upper extremity supported;Feet supported Sitting balance-Leahy Scale: Normal     Standing balance support: No upper extremity supported;During functional activity Standing balance-Leahy Scale: Good Standing balance comment: able to mobilize short distances without AD                           ADL either performed or assessed with clinical judgement   ADL Overall ADL's : Needs assistance/impaired     Grooming: Set up;Standing;Oral care;Wash/dry face Grooming Details (indicate cue type and reason): increased time to open toothbrush, toothpaste since episode yesterday with coordination deficits of B hands                             Functional mobility during ADLs: Supervision/safety General ADL Comments: Pt seen for first OT session since CABG/MVR, min cues for sternal precautions and assessment of new coordination deficits that pt reports occurred after IJ access removal    Extremity/Trunk Assessment Upper Extremity Assessment Upper Extremity Assessment: RUE deficits/detail;LUE deficits/detail RUE  Deficits / Details: new coordination deficits, denies sensation impairments. difficulty with finger to nose test, slow speed and under/overshooting target. RUE Coordination: decreased fine motor LUE Deficits / Details: new coordination deficits,  denies sensation impairments. difficulty with finger to nose test, slow speed and under/overshooting target. L > R; reports difficulty making fist/grasping LUE Coordination: decreased fine motor   Lower Extremity Assessment Lower Extremity Assessment: Defer to PT evaluation        Vision   Vision Assessment?: No apparent visual deficits   Perception     Praxis      Cognition Arousal/Alertness: Awake/alert Behavior During Therapy: WFL for tasks assessed/performed;Flat affect Overall Cognitive Status: Within Functional Limits for tasks assessed                                 General Comments: flat affect but pleasant, follows all directions          Exercises     Shoulder Instructions       General Comments VSS on RA    Pertinent Vitals/ Pain       Pain Assessment: No/denies pain  Home Living                                          Prior Functioning/Environment              Frequency  Min 2X/week        Progress Toward Goals  OT Goals(current goals can now be found in the care plan section)  Progress towards OT goals: Progressing toward goals  Acute Rehab OT Goals Patient Stated Goal: improve coordination OT Goal Formulation: With patient Time For Goal Achievement: 03/01/21 Potential to Achieve Goals: Good ADL Goals Pt Will Transfer to Toilet: Independently;ambulating Pt Will Perform Tub/Shower Transfer: Tub transfer;Independently;ambulating Pt/caregiver will Perform Home Exercise Program: Increased strength;Both right and left upper extremity;With theraband;Independently;With written HEP provided Additional ADL Goal #1: Pt to verbalize at least 3 fall prevention strategies Additional ADL Goal #2: Pt to demo implementation of sternal precautions in all ADLs/mobility with Modified Independence  Plan Discharge plan remains appropriate    Co-evaluation                 AM-PAC OT "6 Clicks" Daily Activity      Outcome Measure   Help from another person eating meals?: None Help from another person taking care of personal grooming?: A Little Help from another person toileting, which includes using toliet, bedpan, or urinal?: A Little Help from another person bathing (including washing, rinsing, drying)?: A Little Help from another person to put on and taking off regular upper body clothing?: None Help from another person to put on and taking off regular lower body clothing?: A Little 6 Click Score: 20    End of Session    OT Visit Diagnosis: Unsteadiness on feet (R26.81);Other abnormalities of gait and mobility (R26.89)   Activity Tolerance Patient tolerated treatment well   Patient Left in bed;with call bell/phone within reach   Nurse Communication Mobility status        Time: 8502-7741 OT Time Calculation (min): 31 min  Charges: OT General Charges $OT Visit: 1 Visit OT Evaluation $OT Re-eval: 1 Re-eval OT Treatments $Self Care/Home Management : 8-22 mins  Bradd Canary, OTR/L Acute Rehab Services Office: [phone]   Raynelle Fanning  Lai Hendriks 02/23/2021, 9:35 AM

## 2021-02-23 NOTE — Progress Notes (Signed)
Physical Therapy Treatment and Discharge Patient Details Name: Mark Garrett MRN: [id number] DOB: [birth year] Today's Date: 02/23/2021   History of Present Illness Pt is a 40 y.o. male admitted 01/27/21 due to CHF exacerbation. S/p R/L heart cath and coronary graft angiography on 11/14. 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 MVR and CABG x 1 on 12/1. Code stroke called on 12/6 after pt could not feel his rt side. MRI showed small acute or early subacute infarct, per MD - does not account for symptoms. Suspect possible TIA or hypoxia. PMH includes CHF, CAD (s/p CABG 2006), mitral valve disease, traumatic amputation of L foot.    PT Comments    Pt is progressing well with mobility. Ambulated and completed stairs with supervision-min guard. Able to recall and maintain sternal precautions without cues. Pt is motivated to move and self-monitors fatigue level well. Pt is now going to live with his aunt after discharge, so discussed new home environment - pt had no concerns. Discharging acute PT services at this time.    Recommendations for follow up therapy are one component of a multi-disciplinary discharge planning process, led by the attending physician.  Recommendations may be updated based on patient status, additional functional criteria and insurance authorization.  Follow Up Recommendations  No PT follow up     Assistance Recommended at Discharge PRN  Equipment Recommendations  Rollator (4 wheels)    Recommendations for Other Services       Precautions / Restrictions Precautions Precautions: Fall;Sternal;Other (comment) Precaution Comments: h/o L forefoot amputation (does not have prosthetic in room; ambulates with/without it at home)     Mobility  Bed Mobility Overal bed mobility: Modified Independent Bed Mobility: Sit to Supine;Supine to Sit     Supine to sit: Modified independent (Device/Increase time) Sit to supine:  Modified independent (Device/Increase time)        Transfers Overall transfer level: Modified independent Equipment used: Rollator (4 wheels) Transfers: Sit to/from Stand Sit to Stand: Modified independent (Device/Increase time)           General transfer comment: good hand placement without cueing    Ambulation/Gait Ambulation/Gait assistance: Supervision;Min guard Gait Distance (Feet): 440 Feet Assistive device: Rollator (4 wheels) Gait Pattern/deviations: Step-through pattern;Decreased stride length Gait velocity: Decreased     General Gait Details: 2 seated rest breaks due to SOB and fatigue; cues to lock brakes on rollator and place against wall when sitting, good recall of this during second seated break. Cues to stay in center of rollator. Min guard for safety as pt started to fatigue   Stairs Stairs: Yes Stairs assistance: Supervision Stair Management: One rail Left;Forwards;Alternating pattern Number of Stairs: 3 General stair comments: up/down 3 steps (due to lines); supervision for safety. Pt navigated stairs well but did report some fatigue after requiring a seated rest break. Pt now going to his aunt's house after discharge which has stairs (6 with a rail in front, 2 without a rail in back). Encouraged pt to use steps with a rail for added stability.   Wheelchair Mobility    Modified Rankin (Stroke Patients Only)       Balance Overall balance assessment: Needs assistance Sitting-balance support: No upper extremity supported;Feet supported Sitting balance-Leahy Scale: Good     Standing balance support: No upper extremity supported;During functional activity Standing balance-Leahy Scale: Fair Standing balance comment: can static stand without DME; uses rollator during ambulation  Cognition Arousal/Alertness: Awake/alert Behavior During Therapy: WFL for tasks assessed/performed Overall Cognitive Status: Within  Functional Limits for tasks assessed                                          Exercises      General Comments        Pertinent Vitals/Pain Pain Assessment: No/denies pain    Home Living                          Prior Function            PT Goals (current goals can now be found in the care plan section) Acute Rehab PT Goals Patient Stated Goal: return home PT Goal Formulation: With patient Time For Goal Achievement: 03/07/21 Potential to Achieve Goals: Good Progress towards PT goals: Progressing toward goals    Frequency    Min 3X/week      PT Plan Current plan remains appropriate    Co-evaluation              AM-PAC PT "6 Clicks" Mobility   Outcome Measure  Help needed turning from your back to your side while in a flat bed without using bedrails?: None Help needed moving from lying on your back to sitting on the side of a flat bed without using bedrails?: None Help needed moving to and from a bed to a chair (including a wheelchair)?: None Help needed standing up from a chair using your arms (e.g., wheelchair or bedside chair)?: None Help needed to walk in hospital room?: A Little Help needed climbing 3-5 steps with a railing? : A Little 6 Click Score: 22    End of Session Equipment Utilized During Treatment: Gait belt Activity Tolerance: Patient tolerated treatment well Patient left: in bed;with call bell/phone within reach Nurse Communication: Mobility status PT Visit Diagnosis: Unsteadiness on feet (R26.81);Muscle weakness (generalized) (M62.81)     Time: [id number] PT Time Calculation (min) (ACUTE ONLY): 39 min  Charges:  $Gait Training: 8-22 mins $Therapeutic Exercise: 8-22 mins $Therapeutic Activity: 8-22 mins                     Mark Garrett, SPT    Mark Garrett 02/23/2021, 3:27 PM

## 2021-02-23 NOTE — Progress Notes (Addendum)
Patient ID: Mark Garrett, male   DOB: [birth year], 40 y.o.   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  Extensive vegetation noted on excised valve. PA pressures 90-100 in OR 12/3: Echo EF 25-30% Mod dec RV function. MVR ok Personally reviewed 12/6: Code Stroke called for R sided weakness. MRI Brain demonstrated a small R frontal white matter stroke which appears subacute and does not explain his R sided symptoms. Neuro ? TIA versus hypoxia. Moves out of the ICU.   Yesterday milirinone stopped. Diuresed with IV lasix. Brisk diuresis noted.   Feels ok. Denies pain.     Objective:   Weight Range: 70.1 kg Body mass index is 21.55 kg/m.   Vital Signs:   Temp:  [98 F (36.7 C)-99.1 F (37.3 C)] 98.2 F (36.8 C) (12/06 2316) Pulse Rate:  [38-93] 76 (12/06 2316) Resp:  [15-30] 20 (12/06 2316) BP: (88-150)/(58-116) 104/70 (12/06 2316) SpO2:  [94 %-100 %] 98 % (12/06 2316) Weight:  [70.1 kg] 70.1 kg (12/07 0259) Last BM Date: 02/22/21  Weight change: Filed Weights   02/21/21 0500 02/22/21 0530 02/23/21 0259  Weight: 74.5 kg 70 kg 70.1 kg    Intake/Output:   Intake/Output Summary (Last 24 hours) at 02/23/2021 0734 Last data filed at 02/23/2021 0443 Gross per 24 hour  Intake 974.17 ml  Output 4675 ml  Net -3700.83 ml      Physical Exam  CVP 4 personally checked.  General:   No resp difficulty HEENT: normal Neck: supple. no JVD. Carotids 2+ bilat; no bruits. No lymphadenopathy or thryomegaly appreciated. Cor: PMI nondisplaced. Regular rate & rhythm. No rubs, gallops or murmurs. Sternal incision approximated.  Lungs: clear Abdomen: soft, nontender, nondistended. No hepatosplenomegaly. No bruits or masses. Good bowel sounds. Extremities: no cyanosis, clubbing, rash, edema. LUE PICC Neuro: alert & orientedx3, cranial nerves grossly intact. moves all 4  extremities w/o difficulty. Affect pleasant   Telemetry   SR  70s personally reviewed.    Labs    CBC Recent Labs    02/21/21 0500 02/22/21 0305  WBC 13.2* 13.5*  HGB 8.1* 8.8*  HCT 26.7* 30.3*  MCV 81.7 81.9  PLT 153 734   Basic Metabolic Panel Recent Labs    02/22/21 0305 02/23/21 0537  NA 134* 133*  K 4.4 4.3  CL 101 98  CO2 24 25  GLUCOSE 79 84  BUN 15 15  CREATININE 0.92 0.80  CALCIUM 8.3* 8.6*   Liver Function Tests No results for input(s): AST, ALT, ALKPHOS, BILITOT, PROT, ALBUMIN in the last 72 hours.  No results for input(s): LIPASE, AMYLASE in the last 72 hours. Cardiac Enzymes Recent Labs    02/21/21 0500  CKTOTAL 56     BNP: BNP (last 3 results) Recent Labs    01/27/21 1034  BNP 1,733.0*    ProBNP (last 3 results) No results for input(s): PROBNP in the last 8760 hours.   D-Dimer No results for input(s): DDIMER in the last 72 hours. Hemoglobin A1C Recent Labs    02/23/21 0537  HGBA1C 4.7*    Fasting Lipid Panel Recent Labs    02/23/21 0537  CHOL 79  HDL 25*  LDLCALC 38  TRIG 80  CHOLHDL 3.2    Thyroid Function Tests No results for input(s): TSH, T4TOTAL, T3FREE, THYROIDAB in the last 72 hours.  Invalid input(s):  FREET3  Other results:   Imaging    MR ANGIO HEAD WO CONTRAST  Addendum Date: 02/22/2021   ADDENDUM REPORT: 02/22/2021 11:31 ADDENDUM: CORRECTION: The focus of mild dwi hyperintensity and adjacent susceptibility artifact described in the left midbrain in the report is actually at the left pontomedulllary junction. Electronically Signed   By: Margaretha Sheffield M.D.   On: 02/22/2021 11:31   Addendum Date: 02/22/2021   ADDENDUM REPORT: 02/22/2021 11:03 ADDENDUM: Impression #2 under MRA neck should be " Potentially moderate stenosis of the proximal RIGHT ICA, although tortuosity limits assessment." Electronically Signed   By: Margaretha Sheffield M.D.   On: 02/22/2021 11:03   Result Date: 02/22/2021 EXAM: MRI  HEAD WITHOUT CONTRAST MRA HEAD WITHOUT CONTRAST MRA OF THE NECK WITHOUT AND WITH CONTRAST TECHNIQUE: Multiplanar, multi-echo pulse sequences of the brain and surrounding structures were acquired without intravenous contrast. Angiographic images of the Circle of Willis were acquired using MRA technique without intravenous contrast. Angiographic images of the neck were acquired using MRA technique without and with intravenous contrast. Carotid stenosis measurements (when applicable) are obtained utilizing NASCET criteria, using the distal internal carotid diameter as the denominator. COMPARISON:  No pertinent prior exam. FINDINGS: MR HEAD FINDINGS Brain: Small acute or early subacute infarct in the high right frontal white matter (series 2, image 34). Small focus of DWI hyperintensity in the left midbrain without ADC correlate. Of note, there is an adjacent focus susceptibility artifact, likely prior hemorrhage. Remote lacunar cerebellar infarcts bilaterally. Prior infarcts in the right frontal and parietal cortex with associated encephalomalacia and gliosis. Additional to moderate scattered T2/FLAIR hyperintensities in the white matter, nonspecific but compatible with chronic microvascular ischemic disease. No hydrocephalus, mass lesion, midline shift, or extra-axial fluid collection. Mild atrophy. Vascular: See below. Skull and upper cervical spine: Normal marrow signal. Sinuses/Orbits: Sinuses are largely clear.  Unremarkable orbits. Other: Small left mastoid effusion. MRA HEAD FINDINGS Anterior circulation: Bilateral intracranial ICAs MCAs, and ACAs are patent without proximal hemodynamically significant stenosis. No aneurysm identified. Posterior circulation: Bilateral intradural vertebral arteries are patent. Small, but patent basilar artery with bilateral posterior communicating arteries and small P1 PCAs, anatomic variant. Multifocal superimposed mild atherosclerotic narrowing of the basilar artery. Bilateral  posterior cerebral arteries are patent without proximal hemodynamically significant stenosis Anatomic variants:Detailed above. MRA NECK FINDINGS Aortic arch: Limited evaluation due to artifact. Great vessel origins are patent. Right carotid system: Patent. Suspected mild to moderate stenosis of the proximal right ICA, although marked tortuosity in this region limits evaluation. 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 Both aneurysms have wide necks (measuring approximately 6 mm in diameter) and are superiorly directed. Left carotid system: Patent. Mild stenosis the proximal ICA. Approximately 2-3 mm outpouching rising from the anterolateral left ICA (see series 60153, image 160). Tortuous ICA at the skull base. Vertebral arteries: Co dominant. Limited evaluation proximally. No visible high-grade stenosis. Mildly tortuous bilaterally. Other: None. IMPRESSION: MRI: 1. Small acute or early subacute infarct in the high right frontal white matter (series 2, image 34). 2. Small focus of mild DWI hyperintensity in the left midbrain is favored artifactual (over acute/subacute infarct) given no correlate ADC hypointensity and given adjacent susceptibility artifact (likely a prior hemorrhage). 3. Remote right frontoparietal and cerebellar infarcts. 4. Mild-to-moderate chronic microvascular disease. MRA head: 1. No large vessel occlusion or proximal hemodynamically significant stenosis. 2. 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 MRA  neck: 1. 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 2. Potentially moderate stenosis of the proximal ICA, although tortuosity limits assessment. 3. If the patient is able, CTA or catheter arteriogram could further evaluate the above  findings. Preliminary findings discussed with Dr. Milas Gain via telephone at 9:40 a.m. Findings of aneurysms conveyed via text page to Dr. Milas Gain at 10:49 AM. Electronically Signed: By: Margaretha Sheffield M.D. On: 02/22/2021 10:57   MR ANGIO NECK W WO CONTRAST  Addendum Date: 02/22/2021   ADDENDUM REPORT: 02/22/2021 11:31 ADDENDUM: CORRECTION: The focus of mild dwi hyperintensity and adjacent susceptibility artifact described in the left midbrain in the report is actually at the left pontomedulllary junction. Electronically Signed   By: Margaretha Sheffield M.D.   On: 02/22/2021 11:31   Addendum Date: 02/22/2021   ADDENDUM REPORT: 02/22/2021 11:03 ADDENDUM: Impression #2 under MRA neck should be " Potentially moderate stenosis of the proximal RIGHT ICA, although tortuosity limits assessment." Electronically Signed   By: Margaretha Sheffield M.D.   On: 02/22/2021 11:03   Result Date: 02/22/2021 EXAM: MRI HEAD WITHOUT CONTRAST MRA HEAD WITHOUT CONTRAST MRA OF THE NECK WITHOUT AND WITH CONTRAST TECHNIQUE: Multiplanar, multi-echo pulse sequences of the brain and surrounding structures were acquired without intravenous contrast. Angiographic images of the Circle of Willis were acquired using MRA technique without intravenous contrast. Angiographic images of the neck were acquired using MRA technique without and with intravenous contrast. Carotid stenosis measurements (when applicable) are obtained utilizing NASCET criteria, using the distal internal carotid diameter as the denominator. COMPARISON:  No pertinent prior exam. FINDINGS: MR HEAD FINDINGS Brain: Small acute or early subacute infarct in the high right frontal white matter (series 2, image 34). Small focus of DWI hyperintensity in the left midbrain without ADC correlate. Of note, there is an adjacent focus susceptibility artifact, likely prior hemorrhage. Remote lacunar cerebellar infarcts bilaterally. Prior infarcts in the right frontal and parietal cortex with  associated encephalomalacia and gliosis. Additional to moderate scattered T2/FLAIR hyperintensities in the white matter, nonspecific but compatible with chronic microvascular ischemic disease. No hydrocephalus, mass lesion, midline shift, or extra-axial fluid collection. Mild atrophy. Vascular: See below. Skull and upper cervical spine: Normal marrow signal. Sinuses/Orbits: Sinuses are largely clear.  Unremarkable orbits. Other: Small left mastoid effusion. MRA HEAD FINDINGS Anterior circulation: Bilateral intracranial ICAs MCAs, and ACAs are patent without proximal hemodynamically significant stenosis. No aneurysm identified. Posterior circulation: Bilateral intradural vertebral arteries are patent. Small, but patent basilar artery with bilateral posterior communicating arteries and small P1 PCAs, anatomic variant. Multifocal superimposed mild atherosclerotic narrowing of the basilar artery. Bilateral posterior cerebral arteries are patent without proximal hemodynamically significant stenosis Anatomic variants:Detailed above. MRA NECK FINDINGS Aortic arch: Limited evaluation due to artifact. Great vessel origins are patent. Right carotid system: Patent. Suspected mild to moderate stenosis of the proximal right ICA, although marked tortuosity in this region limits evaluation. 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 Both aneurysms have wide necks (measuring approximately 6 mm in diameter) and are superiorly directed. Left carotid system: Patent. Mild stenosis the proximal ICA. Approximately 2-3 mm outpouching rising from the anterolateral left ICA (see series 60153, image 160). Tortuous ICA at the skull base. Vertebral arteries: Co dominant. Limited evaluation proximally. No visible high-grade stenosis. Mildly tortuous bilaterally. Other: None. IMPRESSION: MRI: 1.  Small acute or early subacute infarct in the high right frontal white matter (series 2,  image 34). 2. Small focus of mild DWI hyperintensity in the left midbrain is favored artifactual (over acute/subacute infarct) given no correlate ADC hypointensity and given adjacent susceptibility artifact (likely a prior hemorrhage). 3. Remote right frontoparietal and cerebellar infarcts. 4. Mild-to-moderate chronic microvascular disease. MRA head: 1. No large vessel occlusion or proximal hemodynamically significant stenosis. 2. 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 MRA neck: 1. 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 2. Potentially moderate stenosis of the proximal ICA, although tortuosity limits assessment. 3. If the patient is able, CTA or catheter arteriogram could further evaluate the above findings. Preliminary findings discussed with Dr. Milas Gain via telephone at 9:40 a.m. Findings of aneurysms conveyed via text page to Dr. Milas Gain at 10:49 AM. Electronically Signed: By: Margaretha Sheffield M.D. On: 02/22/2021 10:57   MR BRAIN WO CONTRAST  Addendum Date: 02/22/2021   ADDENDUM REPORT: 02/22/2021 11:31 ADDENDUM: CORRECTION: The focus of mild dwi hyperintensity and adjacent susceptibility artifact described in the left midbrain in the report is actually at the left pontomedulllary junction. Electronically Signed   By: Margaretha Sheffield M.D.   On: 02/22/2021 11:31   Addendum Date: 02/22/2021   ADDENDUM REPORT: 02/22/2021 11:03 ADDENDUM: Impression #2 under MRA neck should be " Potentially moderate stenosis of the proximal RIGHT ICA, although tortuosity limits assessment." Electronically Signed   By: Margaretha Sheffield M.D.   On: 02/22/2021 11:03   Result Date: 02/22/2021 EXAM: MRI HEAD WITHOUT CONTRAST MRA HEAD WITHOUT CONTRAST MRA OF THE NECK WITHOUT AND WITH CONTRAST TECHNIQUE: Multiplanar, multi-echo pulse  sequences of the brain and surrounding structures were acquired without intravenous contrast. Angiographic images of the Circle of Willis were acquired using MRA technique without intravenous contrast. Angiographic images of the neck were acquired using MRA technique without and with intravenous contrast. Carotid stenosis measurements (when applicable) are obtained utilizing NASCET criteria, using the distal internal carotid diameter as the denominator. COMPARISON:  No pertinent prior exam. FINDINGS: MR HEAD FINDINGS Brain: Small acute or early subacute infarct in the high right frontal white matter (series 2, image 34). Small focus of DWI hyperintensity in the left midbrain without ADC correlate. Of note, there is an adjacent focus susceptibility artifact, likely prior hemorrhage. Remote lacunar cerebellar infarcts bilaterally. Prior infarcts in the right frontal and parietal cortex with associated encephalomalacia and gliosis. Additional to moderate scattered T2/FLAIR hyperintensities in the white matter, nonspecific but compatible with chronic microvascular ischemic disease. No hydrocephalus, mass lesion, midline shift, or extra-axial fluid collection. Mild atrophy. Vascular: See below. Skull and upper cervical spine: Normal marrow signal. Sinuses/Orbits: Sinuses are largely clear.  Unremarkable orbits. Other: Small left mastoid effusion. MRA HEAD FINDINGS Anterior circulation: Bilateral intracranial ICAs MCAs, and ACAs are patent without proximal hemodynamically significant stenosis. No aneurysm identified. Posterior circulation: Bilateral intradural vertebral arteries are patent. Small, but patent basilar artery with bilateral posterior communicating arteries and small P1 PCAs, anatomic variant. Multifocal superimposed mild atherosclerotic narrowing of the basilar artery. Bilateral posterior cerebral arteries are patent without proximal hemodynamically significant stenosis Anatomic variants:Detailed above. MRA  NECK FINDINGS Aortic arch: Limited evaluation due to artifact. Great vessel origins are patent. Right carotid system: Patent. Suspected mild to moderate stenosis of the proximal right ICA, although marked tortuosity in this region limits evaluation.  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 Both aneurysms have wide necks (measuring approximately 6 mm in diameter) and are superiorly directed. Left carotid system: Patent. Mild stenosis the proximal ICA. Approximately 2-3 mm outpouching rising from the anterolateral left ICA (see series 60153, image 160). Tortuous ICA at the skull base. Vertebral arteries: Co dominant. Limited evaluation proximally. No visible high-grade stenosis. Mildly tortuous bilaterally. Other: None. IMPRESSION: MRI: 1. Small acute or early subacute infarct in the high right frontal white matter (series 2, image 34). 2. Small focus of mild DWI hyperintensity in the left midbrain is favored artifactual (over acute/subacute infarct) given no correlate ADC hypointensity and given adjacent susceptibility artifact (likely a prior hemorrhage). 3. Remote right frontoparietal and cerebellar infarcts. 4. Mild-to-moderate chronic microvascular disease. MRA head: 1. No large vessel occlusion or proximal hemodynamically significant stenosis. 2. 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 MRA neck: 1. 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 2. Potentially moderate stenosis of the proximal ICA, although tortuosity limits assessment. 3. If the patient is able, CTA or catheter arteriogram could further evaluate the above findings. Preliminary findings discussed with Dr. Milas Gain via telephone at 9:40 a.m. Findings of aneurysms conveyed via text page to Dr.  Milas Gain at 10:49 AM. Electronically Signed: By: Margaretha Sheffield M.D. On: 02/22/2021 10:57   CT HEAD CODE STROKE WO CONTRAST  Result Date: 02/22/2021 CLINICAL DATA:  Code stroke.  Neuro deficit, acute, stroke suspected EXAM: CT HEAD WITHOUT CONTRAST TECHNIQUE: Contiguous axial images were obtained from the base of the skull through the vertex without intravenous contrast. COMPARISON:  Multiple priors, Most recent February 02, 2021. FINDINGS: Brain: New hypodensity in the high right frontal cortex, anterior to areas of remote infarct (for example series 3, image 26/27). More posteriorly, there are similar remote infarcts. No evidence of acute hemorrhage. No hydrocephalus. No mass lesion or midline shift. No visible extra-axial fluid collection. Vascular: No hyperdense vessel identified. Calcific intracranial atherosclerosis. Skull: No acute fracture. Sinuses/Orbits: Clear sinuses.  Unremarkable orbits. Other: No mastoid effusions. ASPECTS [facility] Stroke Program Early CT Score) total score (0-10 with 10 being normal): 9 at worst IMPRESSION: 1. New hypodensity in the high right frontal cortex, anterior to areas of remote infarct. This could be artifactual given streak artifact in this region; however, acute infarct is not excluded. An MRI could further evaluate if the patient is able. ASPECTS is 9 if this finding is real. 2. No acute hemorrhage. 3. 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 Findings discussed with Dr. Milas Gain via telephone at 9:24 a.m. Electronically Signed   By: Margaretha Sheffield M.D.   On: 02/22/2021 09:43     Medications:     Scheduled Medications:  albuterol  2.5 mg Nebulization BID   aspirin  81 mg Oral Daily   bisacodyl  10 mg Oral Daily   Or   bisacodyl  10 mg Rectal Daily   Chlorhexidine Gluconate Cloth  6 each Topical Daily   docusate sodium  200 mg Oral Daily   feeding supplement  237 mL Oral TID BM   multivitamin with minerals  1 tablet Oral Daily    pantoprazole  40 mg Oral Daily   rosuvastatin  40 mg Oral Daily   sildenafil  40 mg Oral TID  sodium chloride flush  10-40 mL Intracatheter Q12H   sodium chloride flush  3 mL Intravenous Q12H   spironolactone  25 mg Oral Daily   warfarin  2.5 mg Oral Daily   Warfarin - Physician Dosing Inpatient   Does not apply q1600    Infusions:  sodium chloride Stopped (02/20/21 1127)   sodium chloride     sodium chloride 10 mL/hr at 02/17/21 1435   cefTRIAXone (ROCEPHIN)  IV Stopped (02/22/21 1421)   DAPTOmycin (CUBICIN)  IV 650 mg (02/22/21 2323)   lactated ringers 20 mL/hr at 02/17/21 1435   lactated ringers Stopped (02/22/21 0911)    PRN Medications: sodium chloride, hydrALAZINE, morphine injection, ondansetron (ZOFRAN) IV, oxyCODONE, sodium chloride flush, sodium chloride flush, traMADol    Assessment/Plan   1. Acute systolic CHF: Ischemic cardiomyopathy, symptoms worsened by severe MR.  Echo with EF 35-40%, severe hypokinesis inferior and inferolateral walls, moderate LV dilation with mild LVH, mild RV dilation with moderately decreased systolic function, restricted posterior mitral leaflet and calcified mitral valve with severe mitral regurgitation and at least mild mitral stenosis (mean gradient 8 mmHg), PASP 60. Per report, echo at [facility] showed EF 55-60% with severe MR in 10/22.  It was a technically difficult study per report.  It is possible that he had a new ACS event prior to admission with reported drop in EF though HS-TnI with mild elevation and no trend suggests that it was not immediately prior to admission.  Low output noted with co-ox 39% initially, lactate elevated when he was initially admitted.  Milrinone begun with stabilization.  Now s/p mechanical MVR and SVG-ramus. Echo post-op with E 25-30%, moderately decreased RV function, stable mechanical MV.  CO-OX 54%  off milrinone.  CVP 4 - Volume status improved.   - Start farxiga 10 mg daily.  - Continue sildenafil 40 tid -  Continue spironolactone to 25 mg daily.  - Continue Entresto 24/26 bid with elevated BP. .  2. CAD: S/p CABG 2006.  As above, with fall in EF and CHF exacerbation, possible ACS prior to admission.  However, mild HS-TnI elevation with no trend suggests that ACS was not immediately prior to admission.  Current elevation is likely demand ischemia from volume overload. LHC on 11/14 showed patent LIMA-LAD with SVG occluded at aorta (does not look new); there was complex 80% proximal ramus stenosis.  Initially assumed that the SVG was to the ramus, but subsequently got the CABG operative report from [place] and it looks like it was an SVG-PLV.  There was minimal native RCA disease. LAD territory well-supplied by LIMA. Now s/p SVG-ramus on 12/1.  - No chest pain.  - ASA 81 - Crestor 40 3.  Mitral valve disease: Initial echo showed posterior MV leaflet restricted with severe MR, suspected primarily infarct-related MR given LV dilation and inferior/inferolateral severe hypokinesis.  However, TEE on 11/16 showed vegetation (not bulky but clearly present) on the posterior and anterior leaflets with poor leaflet coaptation, suggesting endocarditis may be the major cause of severe MR.  Reviewed with ID, suspect the mitral vegetations have been present for a while (severe MR on 10/22 echo as well, does not appear to have active infection).  Cannot rule out nonbacterial thrombotic endocarditis (had recent DVT also), but somewhat academic at this point as he will be on anticoagulation for the non-triggered DVT and we are going to treat with antibiotic course given presentation and high WBCs. ANA negative. Extensive vegetation noted on MV at time of MVR,  sent for cultures.  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   - Warfarin started per TCTS, goal INR 2.5-3.5 with On-X valve in mitral position.  - ASA 81 with On-X valve.  - INR 2.2  4. 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  ?due to sedentary lifestyle + ?genetic  predisposition.  He is not a Xarelto failure, had DVT found in 10/22 with Xarelto started at this point, 11/22 dopplers showed partially treated DVTs.  - He will be anticoagulated with mechanical valve.  - On coumadin.  5. Smoking: Needs to quit.  6. PNA: Cultures 11/18 NGTD.  RML PNA on CT chest.  - He completed course of abx initially for PNA.  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 He is able to walk on the stump.  - PT following, continue to mobilize => walk daily in hall.  8. 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  There is also mobile vegetation that appears adherent to plaque in the proximal descending thoracic aorta.  CT head showed no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

## 2021-02-24 ENCOUNTER — Other Ambulatory Visit: Payer: Self-pay | Admitting: Student

## 2021-02-24 ENCOUNTER — Inpatient Hospital Stay (HOSPITAL_COMMUNITY): Payer: Self-pay

## 2021-02-24 ENCOUNTER — Encounter (HOSPITAL_COMMUNITY): Payer: Medicaid Other

## 2021-02-24 DIAGNOSIS — I5021 Acute systolic (congestive) heart failure: Secondary | ICD-10-CM

## 2021-02-24 DIAGNOSIS — Z952 Presence of prosthetic heart valve: Secondary | ICD-10-CM

## 2021-02-24 LAB — GLUCOSE, CAPILLARY
Glucose-Capillary: 89 mg/dL (ref 70–99)
Glucose-Capillary: 76 mg/dL (ref 70–99)
Glucose-Capillary: 77 mg/dL (ref 70–99)
Glucose-Capillary: 100 mg/dL — ABNORMAL HIGH (ref 70–99)

## 2021-02-24 LAB — CBC
HCT: 33.7 % — ABNORMAL LOW (ref 39.0–52.0)
Hemoglobin: 9.9 g/dL — ABNORMAL LOW (ref 13.0–17.0)
MCH: 23.9 pg — ABNORMAL LOW (ref 26.0–34.0)
MCHC: 29.4 g/dL — ABNORMAL LOW (ref 30.0–36.0)
MCV: 81.2 fL (ref 80.0–100.0)
Platelets: 285 10*3/uL (ref 150–400)
RBC: 4.15 MIL/uL — ABNORMAL LOW (ref 4.22–5.81)
WBC: 15 10*3/uL — ABNORMAL HIGH (ref 4.0–10.5)
nRBC: 0 % (ref 0.0–0.2)

## 2021-02-24 LAB — BASIC METABOLIC PANEL
Anion gap: 9 (ref 5–15)
BUN: 17 mg/dL (ref 6–20)
CO2: 23 mmol/L (ref 22–32)
Calcium: 8.8 mg/dL — ABNORMAL LOW (ref 8.9–10.3)
Chloride: 97 mmol/L — ABNORMAL LOW (ref 98–111)
Creatinine, Ser: 0.86 mg/dL (ref 0.61–1.24)
GFR, Estimated: >60 mL/min (ref >60)
Glucose, Bld: 83 mg/dL (ref 70–99)
Potassium: 4.3 mmol/L (ref 3.5–5.1)
Sodium: 129 mmol/L — ABNORMAL LOW (ref 135–145)

## 2021-02-24 LAB — COOXEMETRY PANEL
Carboxyhemoglobin: 1.5 % (ref 0.5–1.5)
Methemoglobin: 0.8 % (ref 0.0–1.5)
O2 Saturation: 52.8 %
Total hemoglobin: 10.1 g/dL — ABNORMAL LOW (ref 12.0–16.0)

## 2021-02-24 LAB — PROTIME-INR
INR: 2.1 — ABNORMAL HIGH (ref 0.8–1.2)
Prothrombin Time: 23.6 seconds — ABNORMAL HIGH (ref 11.4–15.2)

## 2021-02-24 MED ORDER — ASPIRIN 81 MG PO CHEW: 81.0000 mg | CHEWABLE_TABLET | Freq: Every day | ORAL | Status: DC

## 2021-02-24 MED ORDER — LOSARTAN POTASSIUM 25 MG PO TABS
12.5000 mg | ORAL_TABLET | Freq: Two times a day (BID) | ORAL | Status: DC
  Administered 2021-02-24 (×2): 12.5 mg via ORAL
  Filled 2021-02-24 (×2): qty 1

## 2021-02-24 MED ORDER — DIGOXIN 125 MCG PO TABS
0.1250 mg | ORAL_TABLET | Freq: Every day | ORAL | Status: DC
  Administered 2021-02-24 – 2021-03-01 (×6): 0.125 mg via ORAL
  Filled 2021-02-24 (×6): qty 1

## 2021-02-24 MED ORDER — WARFARIN SODIUM 5 MG PO TABS
5.0000 mg | ORAL_TABLET | Freq: Every day | ORAL | Status: DC
  Administered 2021-02-24: 5 mg via ORAL
  Filled 2021-02-24: qty 1

## 2021-02-24 MED FILL — Heparin Sodium (Porcine) Inj 1000 Unit/ML: Qty: 1000 | Status: AC

## 2021-02-24 MED FILL — Heparin Sodium (Porcine) Inj 1000 Unit/ML: INTRAMUSCULAR | Qty: 5000 | Status: AC

## 2021-02-24 MED FILL — Potassium Chloride Inj 2 mEq/ML: INTRAVENOUS | Qty: 40 | Status: AC

## 2021-02-24 MED FILL — Lidocaine HCl Local Preservative Free (PF) Inj 2%: INTRAMUSCULAR | Qty: 15 | Status: AC

## 2021-02-24 NOTE — Progress Notes (Signed)
MD paged regarding pre meds needed for pts CT Scan. No new orders received. Awaiting orders.
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
## 2021-02-24 NOTE — Progress Notes (Signed)
CARDIAC REHAB PHASE I   PRE:  Rate/Rhythm: 79 SR  BP:  Supine:   Sitting: 91/70  Standing:    SaO2: 96% RA  MODE:  Ambulation: 400 ft   POST:  Rate/Rhythm: 86 SR  BP:  Supine:   Sitting: 105/71  Standing:    SaO2: 99% RA Tolerated ambulation well with rollator and assistance x1.   [id number] Cindra Eves RN, BSN 02/24/2021 12:39 PM

## 2021-02-24 NOTE — Plan of Care (Signed)
  Problem: Education: Goal: Knowledge of General Education information will improve Description: Including pain rating scale, medication(s)/side effects and non-pharmacologic comfort measures Outcome: Progressing   Problem: Health Behavior/Discharge Planning: Goal: Ability to manage health-related needs will improve Outcome: Progressing   Problem: Clinical Measurements: Goal: Ability to maintain clinical measurements within normal limits will improve Outcome: Progressing Goal: Will remain free from infection Outcome: Progressing Goal: Diagnostic test results will improve Outcome: Progressing Goal: Respiratory complications will improve Outcome: Progressing Goal: Cardiovascular complication will be avoided Outcome: Progressing   Problem: Activity: Goal: Risk for activity intolerance will decrease Outcome: Progressing   Problem: Nutrition: Goal: Adequate nutrition will be maintained Outcome: Progressing   Problem: Coping: Goal: Level of anxiety will decrease Outcome: Progressing   Problem: Elimination: Goal: Will not experience complications related to bowel motility Outcome: Progressing Goal: Will not experience complications related to urinary retention Outcome: Progressing   Problem: Pain Managment: Goal: General experience of comfort will improve Outcome: Progressing   Problem: Safety: Goal: Ability to remain free from injury will improve Outcome: Progressing   Problem: Skin Integrity: Goal: Risk for impaired skin integrity will decrease Outcome: Progressing   Problem: Education: Goal: Understanding of CV disease, CV risk reduction, and recovery process will improve Outcome: Progressing Goal: Individualized Educational Video(s) Outcome: Progressing   Problem: Activity: Goal: Ability to return to baseline activity level will improve Outcome: Progressing   Problem: Cardiovascular: Goal: Ability to achieve and maintain adequate cardiovascular perfusion  will improve Outcome: Progressing Goal: Vascular access site(s) Level 0-1 will be maintained Outcome: Progressing   Problem: Health Behavior/Discharge Planning: Goal: Ability to safely manage health-related needs after discharge will improve Outcome: Progressing   Problem: Education: Goal: Ability to demonstrate management of disease process will improve Outcome: Progressing Goal: Ability to verbalize understanding of medication therapies will improve Outcome: Progressing Goal: Individualized Educational Video(s) Outcome: Progressing   Problem: Activity: Goal: Capacity to carry out activities will improve Outcome: Progressing   Problem: Cardiac: Goal: Ability to achieve and maintain adequate cardiopulmonary perfusion will improve Outcome: Progressing   

## 2021-02-24 NOTE — Progress Notes (Addendum)
STROKE TEAM PROGRESS NOTE  INTERVAL HISTORY Patient is laying in the bed in NAD.  He voices no new neurological issues. He is scheduled for TCD with bubble and TCD.  His left hand strength seems to be improving today.  Patient is allergic to IV contrast hence cannot have CT angiogram to follow the possible cervical carotid aneurysms noted on the MR angiogram.  However.  He did have a carotid ultrasound 3 CABG on 02/04/2021 which showed no significant extracranial stenosis and these aneurysms were not noted.  OBJECTIVE Vitals:   02/24/21 0408 02/24/21 0543 02/24/21 0829 02/24/21 1121  BP: 100/63  95/61 91/70  Pulse: 80  78 75  Resp: 17  17 19   Temp: 98.3 F (36.8 C)  98.1 F (36.7 C) 97.6 F (36.4 C)  TempSrc: Oral  Oral Oral  SpO2: 96%  96% 95%  Weight:  70.6 kg    Height:        CBC:  Recent Labs  Lab 02/22/21 0305 02/24/21 0500  WBC 13.5* 15.0*  HGB 8.8* 9.9*  HCT 30.3* 33.7*  MCV 81.9 81.2  PLT 191 285     Basic Metabolic Panel:  Recent Labs  Lab 02/18/21 0158 02/18/21 0312 02/18/21 1721 02/18/21 2137 02/23/21 0537 02/24/21 0500  NA 134*   < >  --    < > 133* 129*  K 5.7*   < > 5.1   < > 4.3 4.3  CL 102   < >  --    < > 98 97*  CO2 24   < >  --    < > 25 23  GLUCOSE 127*   < >  --    < > 84 83  BUN 24*   < >  --    < > 15 17  CREATININE 1.27*   < >  --    < > 0.80 0.86  CALCIUM 8.5*   < >  --    < > 8.6* 8.8*  MG 3.0*  --  2.1  --   --   --    < > = values in this interval not displayed.     Lipid Panel:     Component Value Date/Time   CHOL 79 02/23/2021 0537   TRIG 80 02/23/2021 0537   HDL 25 (L) 02/23/2021 0537   CHOLHDL 3.2 02/23/2021 0537   VLDL 16 02/23/2021 0537   LDLCALC 38 02/23/2021 0537   HgbA1c:  Lab Results  Component Value Date   HGBA1C 4.7 (L) 02/23/2021   Urine Drug Screen: No results found for: LABOPIA, COCAINSCRNUR, LABBENZ, AMPHETMU, THCU, LABBARB  Alcohol Level No results found for: ETH  IMAGING   VAS [place] TRANSCRANIAL  DOPPLER W BUBBLES  Result Date: 02/24/2021  Transcranial Doppler with Bubble Patient Name:  Mark Garrett  Date of Exam:   02/24/2021 Medical Rec #: [id number]        Accession #:    [id number] Date of Birth: 04/02/1980         Patient Gender: M Patient Age:   1 years Exam Location:  [facility] Procedure:      VAS [place] TRANSCRANIAL [place] Referring Phys: Loralie Champagne --------------------------------------------------------------------------------  Indications: Stroke. Performing Technologist: Darlin Coco RDMS, RVT  Examination Guidelines: A complete evaluation includes B-mode imaging, spectral Doppler, color Doppler, and power Doppler as needed of all accessible portions of each vessel. Bilateral testing is considered an integral part of a complete examination. Limited  examinations for reoccurring indications may be performed as noted.  Summary: No HITS at rest or during Valsalva. Negative transcranial Doppler Bubble study with no evidence of right to left intracardiac communication.  *See table(s) above for TCD measurements and observations.    Preliminary    VAS [place] TRANSCRANIAL DOPPLER  Result Date: 02/24/2021  Transcranial Doppler Patient Name:  Mark Garrett  Date of Exam:   02/24/2021 Medical Rec #: [id number]        Accession #:    [id number] Date of Birth: 30-Nov-1980         Patient Gender: M Patient Age:   18 years Exam Location:  [facility] Procedure:      VAS [place] TRANSCRANIAL DOPPLER Referring Phys: Loralie Champagne --------------------------------------------------------------------------------  Indications: Stroke. Comparison Study: 02-04-2021 Carotid duplex showed complete reversal of the                   right vertebral artery, monophasic subclavian artery                   waveforms. Performing Technologist: Darlin Coco RDMS, RVT  Examination Guidelines: A complete evaluation includes B-mode imaging, spectral Doppler, color Doppler, and power Doppler as needed of all  accessible portions of each vessel. Bilateral testing is considered an integral part of a complete examination. Limited examinations for reoccurring indications may be performed as noted.  +----------+-------------+----------+-----------+----------+ RIGHT TCD Right VM (cm)Depth (cm)Pulsatility Comment   +----------+-------------+----------+-----------+----------+ MCA           45.00                 0.80               +----------+-------------+----------+-----------+----------+ ACA          -29.00                 0.70               +----------+-------------+----------+-----------+----------+ Term ICA      38.00                 0.84               +----------+-------------+----------+-----------+----------+ PCA           22.00                 0.45               +----------+-------------+----------+-----------+----------+ Opthalmic     28.00                 1.05               +----------+-------------+----------+-----------+----------+ ICA siphon    30.00                 1.23               +----------+-------------+----------+-----------+----------+ Vertebral     37.00                 1.90    Retrograde +----------+-------------+----------+-----------+----------+  +----------+------------+----------+-----------+-------+ LEFT TCD  Left VM (cm)Depth (cm)PulsatilityComment +----------+------------+----------+-----------+-------+ MCA          57.00                 0.94            +----------+------------+----------+-----------+-------+ ACA          -24.00  0.58            +----------+------------+----------+-----------+-------+ Term ICA     43.00                  1.0            +----------+------------+----------+-----------+-------+ PCA          19.00                 0.68            +----------+------------+----------+-----------+-------+ Opthalmic    30.00                 1.11             +----------+------------+----------+-----------+-------+ ICA siphon   32.00                 1.54            +----------+------------+----------+-----------+-------+ Vertebral    -46.00                1.07            +----------+------------+----------+-----------+-------+  +------------+------+-------+             VM cm Comment +------------+------+-------+ Prox Basilar-52.00        +------------+------+-------+ Dist Basilar-52.00        +------------+------+-------+    Preliminary      Transthoracic Echocardiogram  02/19/21 IMPRESSIONS   1. LVEF is depressed with severe hypokinesis/akinesis of the lateral wall, apex, inferior wall. The anterior wall is hypokinestic. COmpared to previous echo preop, wall motion changes are prominent . Left ventricular ejection fraction, by estimation, is 25 to 30%. The left ventricle has severely decreased function.   2. Right ventricular systolic function is moderately reduced. The right ventricular size is normal. There is severely elevated pulmonary artery systolic pressure.   3. 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 Peak and mean gradients through the valve are 15 and 6 mm Hg respecitvely . The mitral valve has been repaired/replaced.   4. The aortic valve is tricuspid. Aortic valve regurgitation is mild to moderate. Aortic valve sclerosis is present, with no evidence of aortic valve stenosis.   5. The inferior vena cava is dilated in size with <50% respiratory variability, suggesting right atrial pressure of 15 mmHg.   ECG - SR rate 64 BPM. (See cardiology reading for complete details)  PHYSICAL EXAM Blood pressure 91/70, pulse 75, temperature 97.6 F (36.4 C), temperature source Oral, resp. rate 19, height 5\' 11"  (1.803 m), weight 70.6 kg, SpO2 95 %.  Temp:  [97.6 F (36.4 C)-98.6 F (37 C)] 97.6 F (36.4 C) (12/08 1121) Pulse Rate:  [75-85] 75 (12/08 1121) Resp:  [15-19] 19 (12/08 1121) BP: (91-127)/(51-80)  91/70 (12/08 1121) SpO2:  [95 %-97 %] 95 % (12/08 1121) Weight:  [70.6 kg] 70.6 kg (12/08 0543)  General -Caucasian male, thin, pale.  No apparent distress  Ophthalmologic - fundi not visualized due to noncooperation.  Cardiovascular - Regular rhythm and rate.  Mental Status -  Level of arousal and orientation to time, place, and person were intact. Language including expression, naming, repetition, comprehension was assessed and found intact. Attention span and concentration were normal. Recent and remote memory were intact. Fund of Knowledge was assessed and was intact.  Cranial Nerves II - XII - II - Visual field intact OU. III, IV, VI - Extraocular movements intact. V - Facial sensation intact bilaterally. VII - Facial movement intact bilaterally.  VIII - Hearing & vestibular intact bilaterally. X - Palate elevates symmetrically. XI - Chin turning & shoulder shrug intact bilaterally. XII - Tongue protrusion intact.  Motor Strength - Bulk poor and fasciculations were absent.  Diminished fine motor control left more than right.  LUE 3+/5 distally in the grip and fingers but good strength proximally., RUE 4/5, lower extremities 5/5  Motor Tone - Muscle tone was assessed at the neck and appendages and was normal.  Sensory - Light touch, temperature/pinprick were assessed and were symmetrical.    Coordination - The patient had normal movements in the hands and feet with no ataxia or dysmetria.  Tremor was absent.  Gait and Station - deferred.   ASSESSMENT/PLAN Mr. AXTEN JARDON is a 40 y.o. male with history of CAD s/p CABG, ischemic cardiomyopathy with severe mitral valve regurgitation, ongoing tobacco use, COPD, hx of DVTs admitted with SOB + Chest pain with recent mobile vegetation on TEE s/p MV replacement on 12/1 - warfarin and aspirin with INR of 2.0 presenting with right sided numbness, sats in 80's and generalized weakness.  He did not receive IV t-PA due to improving  deficits and warfarin therapy.  Stroke: multiple infarcts - cardio-embolic in setting of severe global hypokinesis with optimal anticoagulation with warfarin Resultant mild left hand weakness  Code Stroke CT Head - New hypodensity in the high right frontal cortex, anterior to areas of remote infarct. This could be artifactual given streak artifact in this region; however, acute infarct is not excluded. An MRI could further evaluate if the patient is able. ASPECTS is 9 if this finding is real. No acute hemorrhage. Remote infarcts in the more posterior right frontoparietal region  MRI head - Small acute or early subacute infarct in the high right frontal white matter (series 2, image 34). Small focus of mild DWI hyperintensity in the left midbrain is favored artifactual (over acute/subacute infarct) given no correlate. ADC hypointensity and given adjacent susceptibility artifact (likely a prior hemorrhage). Remote right frontoparietal and cerebellar infarcts. Mild-to-moderate chronic microvascular disease.  MRA head - No large vessel occlusion or proximal hemodynamically significant stenosis. 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 Addendum - The focus of mild dwi hyperintensity and adjacent susceptibility artifact described in the left midbrain in the report is actually at the left pontomedulllary junction.  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 Potentially moderate stenosis of the proximal ICA, although tortuosity limits assessment. If the patient is able, CTA or catheter arteriogram could further evaluate the above findings. ADDENDUM: Impression #2 under MRA neck should be " Potentially moderate stenosis of the proximal RIGHT ICA, although tortuosity limits assessment."  2D Echo - 02/19/21 -  EF 25 - 30%. No cardiac source of emboli identified.  LDL - 38 HgbA1c - 4.7 TCD with bubble study-negative UDS - not ordered VTE prophylaxis - warfarin Diet  Diet Order             DIET DYS 3 Room service appropriate? Yes; Fluid consistency: Thin; Fluid restriction: 1800 mL Fluid  Diet effective now                  Xarelto (rivaroxaban) daily prior to admission, now on aspirin 81 mg daily and warfarin daily Patient will be counseled to be compliant with his antithrombotic medications Ongoing aggressive stroke risk  factor management Therapy recommendations: No needs Disposition:  Pending  Hypertension Home BP meds: none  Current BP meds: none  Stable (BP runs low due to cardiomyopathy) Permissive hypertension (OK if < 220/120) but gradually normalize in 5-7 days  Long-term BP goal normotensive        Hyperlipidemia Home Lipid lowering medication: Crestor 40 mg daily LDL 38, goal < 70 Current lipid lowering medication: Crestor 40 mg daily Continue statin at discharge  Other Stroke Risk Factors Cigarette smoker - advised to stop smoking ETOH use, advised to drink no more than 1 alcoholic beverage per day. 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 MD to update note as needed.  I have personally obtained history,examined this patient, reviewed notes, independently viewed imaging studies, participated in medical decision making and plan of care.ROS completed  by me personally and pertinent positives fully documented  I have made any additions or clarifications directly to the above note. Agree with note above.  Patient cannot have CT angio due to history of allergy to IV dye hence cannot verify the cervical carotid enzymes noted on MR angiogram.  Transcranial Doppler bubble study from the bedside is negative for right-to-left shunt.  Continue ongoing cardiac management as per cardiology.  Physical Occupational Therapy.  Stroke team will sign off.  Kindly call for questions greater than 50% time during this 25-minute visit was spent on counseling and coordination of care about his possible stroke discussion about test results and answered questions.  Delia Heady, MD Medical Director [facility] Pager: [phone] 02/24/2021 5:05 PM   To contact Stroke Continuity provider, please refer to [email or web address]. After hours, contact General Neurology

## 2021-02-24 NOTE — TOC Progression Note (Signed)
Transition of Care [facility]) - Progression Note    Patient Details  Name: PHAT DALTON MRN: [id number] Date of Birth: 07-Sep-1980  Transition of [facility] CM/SW Contact  Anisha Starliper, LCSWA Phone Number: 02/24/2021, 3:48 PM  Clinical Narrative:    HF CSW returned a call from Jeri Modena [phone] with [employer] for the IV Abx as she wasn't able to get in touch with the patient's aunt, Rosey Bath. CSW spoke with the patient's aunt, Rosey Bath [phone] and she confirmed that Yoshua will be able to stay with her at the time of discharge and will need the hospital bed for Mr. Console. CSW informed Pam to please reach out again to Rosey Bath as the CSW just spoke with Rosey Bath over the phone. CSW spoke with Ian Malkin at [employer] [phone] regarding the hospital bed that will be delivered to [facility] home and provided confirmation.  CSW will continue to follow throughout discharge.  Expected Discharge Plan: Home/Self Care Barriers to Discharge: Continued Medical Work up  Expected Discharge Plan and Services Expected Discharge Plan: Home/Self Care In-house Referral: Clinical Social Work Discharge Planning Services: CM Consult, Indigent Health Clinic, MATCH Program, Medication Assistance, Follow-up appt scheduled   Living arrangements for the past 2 months: Single Family Home                   DME Agency: NA       HH Arranged: NA           Social Determinants of Health (SDOH) Interventions Food Insecurity Interventions: Other (Comment) (Patient has food stamps $250 a month) Financial Strain Interventions: Intervention Not Indicated, Artist (referral to [employer] to screen for Medicaid, patient agreeable) Housing Interventions: Intervention Not Indicated Transportation Interventions: Retail banker  Readmission Risk Interventions No flowsheet data found.  Lelon Ikard, MSW, LCSWA [phone] Heart Failure Social Worker

## 2021-02-24 NOTE — Progress Notes (Signed)
TCD w/ bubble and TCD study completed.   Please see CV Proc for preliminary results.   Jean Rosenthal, RDMS, RVT

## 2021-02-24 NOTE — Plan of Care (Signed)
  Problem: Education: Goal: Knowledge of General Education information will improve Description: Including pain rating scale, medication(s)/side effects and non-pharmacologic comfort measures Outcome: Progressing   Problem: Health Behavior/Discharge Planning: Goal: Ability to manage health-related needs will improve Outcome: Progressing   Problem: Clinical Measurements: Goal: Ability to maintain clinical measurements within normal limits will improve Outcome: Progressing Goal: Will remain free from infection Outcome: Progressing Goal: Diagnostic test results will improve Outcome: Progressing Goal: Respiratory complications will improve Outcome: Progressing Goal: Cardiovascular complication will be avoided Outcome: Progressing   Problem: Activity: Goal: Risk for activity intolerance will decrease Outcome: Progressing   Problem: Nutrition: Goal: Adequate nutrition will be maintained Outcome: Progressing   Problem: Coping: Goal: Level of anxiety will decrease Outcome: Progressing   Problem: Elimination: Goal: Will not experience complications related to bowel motility Outcome: Progressing Goal: Will not experience complications related to urinary retention Outcome: Progressing   Problem: Pain Managment: Goal: General experience of comfort will improve Outcome: Progressing   Problem: Safety: Goal: Ability to remain free from injury will improve Outcome: Progressing   Problem: Skin Integrity: Goal: Risk for impaired skin integrity will decrease Outcome: Progressing   Problem: Education: Goal: Understanding of CV disease, CV risk reduction, and recovery process will improve Outcome: Progressing Goal: Individualized Educational Video(s) Outcome: Progressing   Problem: Activity: Goal: Ability to return to baseline activity level will improve Outcome: Progressing   Problem: Cardiovascular: Goal: Ability to achieve and maintain adequate cardiovascular perfusion  will improve Outcome: Progressing Goal: Vascular access site(s) Level 0-1 will be maintained Outcome: Progressing   Problem: Health Behavior/Discharge Planning: Goal: Ability to safely manage health-related needs after discharge will improve Outcome: Progressing   Problem: Education: Goal: Ability to demonstrate management of disease process will improve Outcome: Progressing Goal: Ability to verbalize understanding of medication therapies will improve Outcome: Progressing Goal: Individualized Educational Video(s) Outcome: Progressing   Problem: Education: Goal: Knowledge of secondary prevention will improve (SELECT ALL) Outcome: Progressing Goal: Knowledge of patient specific risk factors will improve (INDIVIDUALIZE FOR PATIENT) Outcome: Progressing

## 2021-02-24 NOTE — Progress Notes (Addendum)
      [address]       [place] [address]             [phone]        7 Days Post-Op Procedure(s) (LRB): REDO STERNOTOMY (N/A) MITRAL VALVE (MV) REPLACEMENT USING ON-X 27/29 MM PROSTHETIC MITRAL VALVE (N/A) CORONARY ARTERY BYPASS GRAFTING (CABG), ON PUMP, TIMES ONE, USING ENDOSCOPICALLY HARVESTED RIGHT GREATER SAPHENOUS VEIN (N/A) TRANSESOPHAGEAL ECHOCARDIOGRAM (TEE) (N/A) APPLICATION OF CELL SAVER  Subjective: Patient just finished breakfast. He states he walked yesterday. He has complaints of generalized weakness  Objective: Vital signs in last 24 hours: Temp:  [98 F (36.7 C)-98.6 F (37 C)] 98.3 F (36.8 C) (12/08 0408) Pulse Rate:  [78-85] 80 (12/08 0408) Cardiac Rhythm: Normal sinus rhythm;Heart block;Bundle branch block (12/07 1901) Resp:  [15-21] 17 (12/08 0408) BP: (96-127)/(51-80) 100/63 (12/08 0408) SpO2:  [90 %-97 %] 96 % (12/08 0408) Weight:  [70.6 kg] 70.6 kg (12/08 0543)  Pre op weight  70.2 kg Current Weight  02/24/21 70.6 kg    Hemodynamic parameters for last 24 hours: CVP:  [5 mmHg-13 mmHg] 5 mmHg  Intake/Output from previous day: 12/07 0701 - 12/08 0700 In: 1663 [P.O.:1437; IV Piggyback:226] Out: 1600 [Urine:1600]   Physical Exam:  Cardiovascular: RRR, sharp valve click Pulmonary: Clear Abdomen: Soft, non tender, bowel sounds present. Extremities: No lower extremity edema. Partal left foot amputation Wound: Clean and dry.  No erythema or signs of infection. Neurologic: Moves all 4 extremities, follows commands  Lab Results: CBC: Recent Labs    02/22/21 0305  WBC 13.5*  HGB 8.8*  HCT 30.3*  PLT 191    BMET:  Recent Labs    02/23/21 0537 02/24/21 0500  NA 133* 129*  K 4.3 4.3  CL 98 97*  CO2 25 23  GLUCOSE 84 83  BUN 15 17  CREATININE 0.80 0.86  CALCIUM 8.6* 8.8*     PT/INR:  Lab Results  Component Value Date   INR 2.1 (H) 02/24/2021   INR 2.2 (H) 02/23/2021   INR 2.0 (H) 02/22/2021   ABG:   INR: Will add last result for INR, ABG once components are confirmed Will add last 4 CBG results once components are confirmed  Assessment/Plan:  1. CV - SR, first degree heart block. On Coumadin. INR this am 2.1. Will give Coumadin 5 mg tonight.Co ox this am decreased to 52.8; Milrinone stopped 12/06.Marland Kitchen PAH-on Sildenafil 40 mg tid and Farxiga started yesterday 2.  Pulmonary - History of COPD. On room air. Encourage incentive spirometer. 3. Acute systolic CHF-on Spironolactone 25 mg daily 4.  Expected post op acute blood loss anemia - Last H and H stable at 8.8 and 30.3 5. Neuro-Etiology of events likely cardio embolic. CT angio of neck ordered. 6. 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 Hyponatremia-sodium this am 129 8.Deconditioned-PT/OT. In terms of discharge, patient requires a total of 6 weeks of IV antibiotics. Once Coumadin dose determined and arrangements made, will discharge  Donielle M ZimmermanPA-C 02/24/2021,6:59 AM    Chart reviewed, patient examined, agree with above. He is stable overall.  CVP low and wt about at preop. Sodium trending down. Agree with increasing Coumadin to 5 mg to get INR to goal 2.5-3.5

## 2021-02-24 NOTE — Progress Notes (Signed)
Ordered 6 week post-op Echo following MVR per protocol. Primary Cardiologist is Dr. Shirlee Latch. CT Surgeon is Dr. Laneta Simmers.  Corrin Parker, PA-C 02/24/2021 4:34 PM

## 2021-02-25 LAB — GLUCOSE, CAPILLARY
Glucose-Capillary: 54 mg/dL — ABNORMAL LOW (ref 70–99)
Glucose-Capillary: 76 mg/dL (ref 70–99)
Glucose-Capillary: 77 mg/dL (ref 70–99)

## 2021-02-25 LAB — BASIC METABOLIC PANEL
Anion gap: 10 (ref 5–15)
BUN: 14 mg/dL (ref 6–20)
CO2: 24 mmol/L (ref 22–32)
Calcium: 8.5 mg/dL — ABNORMAL LOW (ref 8.9–10.3)
Chloride: 101 mmol/L (ref 98–111)
Creatinine, Ser: 0.78 mg/dL (ref 0.61–1.24)
GFR, Estimated: >60 mL/min (ref >60)
Glucose, Bld: 123 mg/dL — ABNORMAL HIGH (ref 70–99)
Potassium: 3.7 mmol/L (ref 3.5–5.1)
Sodium: 135 mmol/L (ref 135–145)

## 2021-02-25 LAB — PROTIME-INR
INR: 3 — ABNORMAL HIGH (ref 0.8–1.2)
Prothrombin Time: 31 seconds — ABNORMAL HIGH (ref 11.4–15.2)

## 2021-02-25 LAB — CBC
HCT: 30.7 % — ABNORMAL LOW (ref 39.0–52.0)
Hemoglobin: 9.3 g/dL — ABNORMAL LOW (ref 13.0–17.0)
MCH: 24.6 pg — ABNORMAL LOW (ref 26.0–34.0)
MCHC: 30.3 g/dL (ref 30.0–36.0)
MCV: 81.2 fL (ref 80.0–100.0)
Platelets: 304 10*3/uL (ref 150–400)
RBC: 3.78 MIL/uL — ABNORMAL LOW (ref 4.22–5.81)
RDW: 28 % — ABNORMAL HIGH (ref 11.5–15.5)
WBC: 12.3 10*3/uL — ABNORMAL HIGH (ref 4.0–10.5)
nRBC: 0 % (ref 0.0–0.2)

## 2021-02-25 LAB — COOXEMETRY PANEL
Carboxyhemoglobin: 1.7 % — ABNORMAL HIGH (ref 0.5–1.5)
Methemoglobin: 0.7 % (ref 0.0–1.5)
O2 Saturation: 62.7 %
Total hemoglobin: 9.7 g/dL — ABNORMAL LOW (ref 12.0–16.0)

## 2021-02-25 MED ORDER — POTASSIUM CHLORIDE CRYS ER 20 MEQ PO TBCR
30.0000 meq | EXTENDED_RELEASE_TABLET | Freq: Once | ORAL | Status: AC
  Administered 2021-02-25: 30 meq via ORAL
  Filled 2021-02-25: qty 1

## 2021-02-25 MED ORDER — SACUBITRIL-VALSARTAN 24-26 MG PO TABS
1.0000 | ORAL_TABLET | Freq: Two times a day (BID) | ORAL | Status: DC
  Administered 2021-02-25 – 2021-03-01 (×7): 1 via ORAL
  Filled 2021-02-25 (×8): qty 1

## 2021-02-25 MED ORDER — WARFARIN SODIUM 2 MG PO TABS: 2.0000 mg | ORAL_TABLET | Freq: Every day | ORAL | Status: DC

## 2021-02-25 MED ORDER — WARFARIN SODIUM 3 MG PO TABS
3.0000 mg | ORAL_TABLET | Freq: Every day | ORAL | Status: DC
  Administered 2021-02-25: 3 mg via ORAL
  Filled 2021-02-25: qty 1

## 2021-02-25 NOTE — Progress Notes (Incomplete)
Hypoglycemic Event  CBG: 54  Treatment: 8 oz juice/soda  Symptoms:  asymptomatic  Follow-up CBG: Time:*** CBG Result:***  Possible Reasons for Event: Unknown  Comments/MD notified:N/A    Mark Garrett

## 2021-02-25 NOTE — Progress Notes (Signed)
Occupational Therapy Treatment Patient Details Name: Mark Garrett MRN: [id number] DOB: [birth year] Today's Date: 02/25/2021   History of present illness Pt is a 40 y.o. male admitted 01/27/21 due to CHF exacerbation. S/p R/L heart cath and coronary graft angiography on 11/14. 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 MVR and CABG x 1 on 12/1. Code stroke called on 12/6 after pt could not feel his rt side. Possible TIA or hypoxia. PMH includes CHF, CAD (s/p CABG 2006), mitral valve disease, traumatic amputation of L foot.   OT comments  Pt received sleeping but easily alerted. Continues to require verbal and occasionally tactile cues for precautions. He continues to have some decreased coordination in B hands but is able to perform grooming task with increased time for opening containers. He did have some decreased safety awareness while using rollator walker. Good tolerance for session with stable vitals. RN made aware that pt left up in chair with call light in reach.   Recommendations for follow up therapy are one component of a multi-disciplinary discharge planning process, led by the attending physician.  Recommendations may be updated based on patient status, additional functional criteria and insurance authorization.    Follow Up Recommendations  No OT follow up    Assistance Recommended at Discharge PRN  Equipment Recommendations  None recommended by OT       Precautions / Restrictions Precautions Precautions: Fall;Sternal;Other (comment) Precaution Comments: h/o L forefoot amputation (does not have prosthetic in room; ambulates with/without it at home)       Mobility Bed Mobility Overal bed mobility: Modified Independent Bed Mobility: Supine to Sit           General bed mobility comments: light use of bedrail, cues for sternal precautions    Transfers   Equipment used: Rollator (4 wheels) Transfers: Sit to/from  Stand Sit to Stand: Supervision           General transfer comment: good hand placement without cueing     Balance Overall balance assessment: Needs assistance             ADL either performed or assessed with clinical judgement   ADL Overall ADL's : Needs assistance/impaired Eating/Feeding: Set up (unable to open containers without assistance but demonstrated indep with feed task)   Grooming: Set up;Standing;Oral care (increased time to perform due to coordination deficits)           Functional mobility during ADLs: Supervision/safety;Min guard (assist for navigating furniture in room using AE)      Extremity/Trunk Assessment Upper Extremity Assessment RUE Coordination: decreased fine motor (deficits addressed with treatment focused on completing grooming task) LUE Coordination: decreased fine motor (deficits addressed with treatment focused on completing grooming task)             Cognition Arousal/Alertness: Awake/alert (asleep on arrival but progressed to alert state) Behavior During Therapy: Flat affect Overall Cognitive Status: No family/caregiver present to determine baseline cognitive functioning (concerns for violating sternal precautions and decreased understanding of use of AE for safe mobility)       General Comments: flat affect but pleasant, needing reinforcement of cues for safety                     Pertinent Vitals/ Pain       Pain Assessment: No/denies pain   Frequency  Min 2X/week        Progress Toward Goals  OT Goals(current goals can  now be found in the care plan section)  Progress towards OT goals: Progressing toward goals     Plan Discharge plan remains appropriate       AM-PAC OT "6 Clicks" Daily Activity     Outcome Measure   Help from another person eating meals?: None Help from another person taking care of personal grooming?: A Little Help from another person toileting, which includes using toliet, bedpan,  or urinal?: A Little Help from another person bathing (including washing, rinsing, drying)?: A Little Help from another person to put on and taking off regular upper body clothing?: None Help from another person to put on and taking off regular lower body clothing?: A Little 6 Click Score: 20    End of Session Equipment Utilized During Treatment: Rollator (4 wheels)  OT Visit Diagnosis: Unsteadiness on feet (R26.81);Other abnormalities of gait and mobility (R26.89)   Activity Tolerance Patient tolerated treatment well   Patient Left in bed;with call bell/phone within reach   Nurse Communication Other (comment);Mobility status (concern for some decreased safety awareness during mobility)        Time: 1100-1131 OT Time Calculation (min): 31 min  Charges: OT Treatments $Self Care/Home Management : 8-22 mins $Therapeutic Activity: 8-22 mins    Uday Jantz M Azra Abrell, OTRL 02/25/2021, 1:10 PM

## 2021-02-25 NOTE — Progress Notes (Signed)
CARDIAC REHAB PHASE I   PRE:  Rate/Rhythm: 78 SR  BP:  Sitting: 129/81      SaO2: 95 RA  MODE:  Ambulation: 400 ft   POST:  Rate/Rhythm: 86 SR  BP:  Sitting: 115/63    SaO2: 96 RA   Pt ambulated 456ft in hallway standby assist with rollator. Pt denies CP, SOB, or dizziness. Pt returned to bed. Pt educated on site care and monitoring incisions daily. Encouraged continued IS use, walks, and sternal precautions. Pt given in-the-tube sheet, heart healthy diet, and smoking cessation tip sheet. Pt anxious to smoke, and not interested in quitting at this time. Reviewed restrictions and exercise guidelines. Will refer to CRP II [place].  [id number] Reynold Bowen, RN BSN 02/25/2021 2:24 PM

## 2021-02-25 NOTE — Plan of Care (Signed)
  Problem: Education: Goal: Knowledge of General Education information will improve Description: Including pain rating scale, medication(s)/side effects and non-pharmacologic comfort measures Outcome: Progressing   Problem: Health Behavior/Discharge Planning: Goal: Ability to manage health-related needs will improve Outcome: Progressing   Problem: Clinical Measurements: Goal: Ability to maintain clinical measurements within normal limits will improve Outcome: Progressing Goal: Will remain free from infection Outcome: Progressing Goal: Diagnostic test results will improve Outcome: Progressing Goal: Respiratory complications will improve Outcome: Progressing Goal: Cardiovascular complication will be avoided Outcome: Progressing   Problem: Activity: Goal: Risk for activity intolerance will decrease Outcome: Progressing   Problem: Nutrition: Goal: Adequate nutrition will be maintained Outcome: Progressing   Problem: Coping: Goal: Level of anxiety will decrease Outcome: Progressing   Problem: Elimination: Goal: Will not experience complications related to bowel motility Outcome: Progressing Goal: Will not experience complications related to urinary retention Outcome: Progressing   Problem: Pain Managment: Goal: General experience of comfort will improve Outcome: Progressing   Problem: Safety: Goal: Ability to remain free from injury will improve Outcome: Progressing   Problem: Skin Integrity: Goal: Risk for impaired skin integrity will decrease Outcome: Progressing   Problem: Education: Goal: Understanding of CV disease, CV risk reduction, and recovery process will improve Outcome: Progressing Goal: Individualized Educational Video(s) Outcome: Progressing   Problem: Activity: Goal: Ability to return to baseline activity level will improve Outcome: Progressing   Problem: Cardiovascular: Goal: Ability to achieve and maintain adequate cardiovascular perfusion  will improve Outcome: Progressing Goal: Vascular access site(s) Level 0-1 will be maintained Outcome: Progressing   Problem: Health Behavior/Discharge Planning: Goal: Ability to safely manage health-related needs after discharge will improve Outcome: Progressing   Problem: Education: Goal: Ability to demonstrate management of disease process will improve Outcome: Progressing Goal: Ability to verbalize understanding of medication therapies will improve Outcome: Progressing Goal: Individualized Educational Video(s) Outcome: Progressing   Problem: Activity: Goal: Capacity to carry out activities will improve Outcome: Progressing   Problem: Cardiac: Goal: Ability to achieve and maintain adequate cardiopulmonary perfusion will improve Outcome: Progressing   Problem: Education: Goal: Knowledge of secondary prevention will improve (SELECT ALL) Outcome: Progressing Goal: Knowledge of patient specific risk factors will improve (INDIVIDUALIZE FOR PATIENT) Outcome: Progressing
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
## 2021-02-25 NOTE — TOC CM/SW Note (Addendum)
1630 HF TOC CM spoke to pt and declines hospital bed. States they are requiring a credit card on file and he does not have a credit card. And his aunt would not be provided credit card. States he did let his aunt know he was not getting hospital bed. His Rollator and 3n1 bedside commode are in the room. Working with [employer] on approval for IV abx. Will have to wait until Tuesday when new agency rotates to charity to send referral for [facility] RN. Updated attending. Isidoro Donning RN3 CCM, Heart Failure TOC CM [phone]   1035 am HF TOC CM received call from [facility], unable to service pt under charity program due to staffing. Contacted [employer] rep, Pam IV Infusion Coordinator and charity approval for Daptomycin pending. States she will contact [facility], and [facility] to see if LOG is possible. Will continue to work on DME and Home IV abx. Isidoro Donning RN3 CCM, Heart Failure TOC CM [phone]

## 2021-02-25 NOTE — Progress Notes (Addendum)
      [address]       [place] [address]             [phone]        8 Days Post-Op Procedure(s) (LRB): REDO STERNOTOMY (N/A) MITRAL VALVE (MV) REPLACEMENT USING ON-X 27/29 MM PROSTHETIC MITRAL VALVE (N/A) CORONARY ARTERY BYPASS GRAFTING (CABG), ON PUMP, TIMES ONE, USING ENDOSCOPICALLY HARVESTED RIGHT GREATER SAPHENOUS VEIN (N/A) TRANSESOPHAGEAL ECHOCARDIOGRAM (TEE) (N/A) APPLICATION OF CELL SAVER  Subjective: Patient just waking up this am. He thinks overall weakness is slowly improving.  Objective: Vital signs in last 24 hours: Temp:  [97.6 F (36.4 C)-98.4 F (36.9 C)] 97.8 F (36.6 C) (12/09 0353) Pulse Rate:  [73-78] 73 (12/09 0353) Cardiac Rhythm: Normal sinus rhythm (12/08 1919) Resp:  [16-20] 18 (12/09 0353) BP: (91-126)/(61-83) 114/83 (12/09 0353) SpO2:  [92 %-100 %] 92 % (12/09 0353) Weight:  [70.7 kg] 70.7 kg (12/09 0520)  Pre op weight  70.2 kg Current Weight  02/25/21 70.7 kg    Hemodynamic parameters for last 24 hours: CVP:  [3 mmHg-8 mmHg] 3 mmHg  Intake/Output from previous day: 12/08 0701 - 12/09 0700 In: 1046 [P.O.:980; IV Piggyback:66] Out: 3450 [Urine:3450]   Physical Exam:  Cardiovascular: RRR, sharp valve click Pulmonary: Clear Abdomen: Soft, non tender, bowel sounds present. Extremities: No lower extremity edema. Partal left foot amputation Wound: Clean and dry.  No erythema or signs of infection. Neurologic: Moves all 4 extremities, follows commands  Lab Results: CBC: Recent Labs    02/24/21 0500 02/25/21 0430  WBC 15.0* 12.3*  HGB 9.9* 9.3*  HCT 33.7* 30.7*  PLT 285 304    BMET:  Recent Labs    02/24/21 0500 02/25/21 0430  NA 129* 135  K 4.3 3.7  CL 97* 101  CO2 23 24  GLUCOSE 83 123*  BUN 17 14  CREATININE 0.86 0.78  CALCIUM 8.8* 8.5*     PT/INR:  Lab Results  Component Value Date   INR 2.1 (H) 02/24/2021   INR 2.2 (H) 02/23/2021   INR 2.0 (H) 02/22/2021   ABG:  INR: Will add  last result for INR, ABG once components are confirmed Will add last 4 CBG results once components are confirmed  Assessment/Plan:  1. CV - SR, first degree heart block. On Digoxin 0.125 mg daily, Losartan 12.5 mg bid,Coumadin 5 mg daily.Await INR result this am. Co ox this am increased to 62.7; Milrinone stopped 12/06.Marland Kitchen PAH-on Sildenafil 40 mg tid  2.  Pulmonary - History of COPD. On room air. Encourage incentive spirometer. 3. Acute systolic CHF-on Spironolactone 25 mg daily 4.  Expected post op acute blood loss anemia - H and H this am slightly decreased to 9.3 and 30.7 5. Neuro-Etiology of events likely cardio embolic.  6. 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 Hyponatremia resolved-sodium this am up to 135 8. Supplement potassium 9.Deconditioned-PT/OT. In terms of discharge, patient requires a total of 6 weeks of IV antibiotics. Once Coumadin dose determined and arrangements made, will discharge  Donielle M ZimmermanPA-C 02/25/2021,6:53 AM    Chart reviewed, patient examined, agree with above. INR jumped up to 3.0 after 5 mg Coumadin. Will decrease Coumadin dose to 3 mg daily.

## 2021-02-26 LAB — COOXEMETRY PANEL
Carboxyhemoglobin: 1.8 % — ABNORMAL HIGH (ref 0.5–1.5)
Methemoglobin: 0.7 % (ref 0.0–1.5)
O2 Saturation: 56.1 %
Total hemoglobin: 10.1 g/dL — ABNORMAL LOW (ref 12.0–16.0)

## 2021-02-26 LAB — CBC
HCT: 33.7 % — ABNORMAL LOW (ref 39.0–52.0)
Hemoglobin: 10.1 g/dL — ABNORMAL LOW (ref 13.0–17.0)
MCH: 24.4 pg — ABNORMAL LOW (ref 26.0–34.0)
MCHC: 30 g/dL (ref 30.0–36.0)
MCV: 81.4 fL (ref 80.0–100.0)
Platelets: 381 10*3/uL (ref 150–400)
RBC: 4.14 MIL/uL — ABNORMAL LOW (ref 4.22–5.81)
RDW: 28.2 % — ABNORMAL HIGH (ref 11.5–15.5)
WBC: 13.3 10*3/uL — ABNORMAL HIGH (ref 4.0–10.5)
nRBC: 0 % (ref 0.0–0.2)

## 2021-02-26 LAB — BASIC METABOLIC PANEL
Anion gap: 9 (ref 5–15)
BUN: 14 mg/dL (ref 6–20)
CO2: 23 mmol/L (ref 22–32)
Calcium: 8.7 mg/dL — ABNORMAL LOW (ref 8.9–10.3)
Chloride: 100 mmol/L (ref 98–111)
Creatinine, Ser: 0.84 mg/dL (ref 0.61–1.24)
GFR, Estimated: >60 mL/min (ref >60)
Glucose, Bld: 87 mg/dL (ref 70–99)
Potassium: 4.4 mmol/L (ref 3.5–5.1)
Sodium: 132 mmol/L — ABNORMAL LOW (ref 135–145)

## 2021-02-26 LAB — GLUCOSE, CAPILLARY
Glucose-Capillary: 92 mg/dL (ref 70–99)
Glucose-Capillary: 103 mg/dL — ABNORMAL HIGH (ref 70–99)
Glucose-Capillary: 84 mg/dL (ref 70–99)

## 2021-02-26 LAB — PROTIME-INR
INR: 3.8 — ABNORMAL HIGH (ref 0.8–1.2)
Prothrombin Time: 37.1 seconds — ABNORMAL HIGH (ref 11.4–15.2)

## 2021-02-26 LAB — DIGOXIN LEVEL: Digoxin Level: <0.2 ng/mL — ABNORMAL LOW (ref 0.8–2.0)

## 2021-02-26 MED ORDER — ALTEPLASE 2 MG IJ SOLR
2.0000 mg | Freq: Once | INTRAMUSCULAR | Status: AC
  Administered 2021-02-26: 2 mg
  Filled 2021-02-26 (×2): qty 2

## 2021-02-26 MED ORDER — METOPROLOL SUCCINATE ER 25 MG PO TB24
12.5000 mg | ORAL_TABLET | Freq: Every day | ORAL | Status: DC
  Administered 2021-02-26 – 2021-03-01 (×4): 12.5 mg via ORAL
  Filled 2021-02-26 (×4): qty 1

## 2021-02-26 NOTE — Plan of Care (Signed)
  Problem: Education: Goal: Knowledge of General Education information will improve Description: Including pain rating scale, medication(s)/side effects and non-pharmacologic comfort measures Outcome: Progressing   Problem: Health Behavior/Discharge Planning: Goal: Ability to manage health-related needs will improve Outcome: Progressing   Problem: Clinical Measurements: Goal: Ability to maintain clinical measurements within normal limits will improve Outcome: Progressing Goal: Will remain free from infection Outcome: Progressing Goal: Diagnostic test results will improve Outcome: Progressing Goal: Respiratory complications will improve Outcome: Progressing Goal: Cardiovascular complication will be avoided Outcome: Progressing   Problem: Activity: Goal: Risk for activity intolerance will decrease Outcome: Progressing   Problem: Nutrition: Goal: Adequate nutrition will be maintained Outcome: Progressing   Problem: Coping: Goal: Level of anxiety will decrease Outcome: Progressing   Problem: Elimination: Goal: Will not experience complications related to bowel motility Outcome: Progressing Goal: Will not experience complications related to urinary retention Outcome: Progressing   Problem: Pain Managment: Goal: General experience of comfort will improve Outcome: Progressing   Problem: Safety: Goal: Ability to remain free from injury will improve Outcome: Progressing   Problem: Skin Integrity: Goal: Risk for impaired skin integrity will decrease Outcome: Progressing   Problem: Education: Goal: Understanding of CV disease, CV risk reduction, and recovery process will improve Outcome: Progressing Goal: Individualized Educational Video(s) Outcome: Progressing   Problem: Activity: Goal: Ability to return to baseline activity level will improve Outcome: Progressing   Problem: Cardiovascular: Goal: Ability to achieve and maintain adequate cardiovascular perfusion  will improve Outcome: Progressing Goal: Vascular access site(s) Level 0-1 will be maintained Outcome: Progressing   Problem: Health Behavior/Discharge Planning: Goal: Ability to safely manage health-related needs after discharge will improve Outcome: Progressing   Problem: Education: Goal: Ability to demonstrate management of disease process will improve Outcome: Progressing Goal: Ability to verbalize understanding of medication therapies will improve Outcome: Progressing Goal: Individualized Educational Video(s) Outcome: Progressing   Problem: Activity: Goal: Capacity to carry out activities will improve Outcome: Progressing   Problem: Cardiac: Goal: Ability to achieve and maintain adequate cardiopulmonary perfusion will improve Outcome: Progressing   Problem: Education: Goal: Knowledge of secondary prevention will improve (SELECT ALL) Outcome: Progressing Goal: Knowledge of patient specific risk factors will improve (INDIVIDUALIZE FOR PATIENT) Outcome: Progressing

## 2021-02-26 NOTE — Progress Notes (Addendum)
[address]       [place] [address]             [phone]        9 Days Post-Op Procedure(s) (LRB): REDO STERNOTOMY (N/A) MITRAL VALVE (MV) REPLACEMENT USING ON-X 27/29 MM PROSTHETIC MITRAL VALVE (N/A) CORONARY ARTERY BYPASS GRAFTING (CABG), ON PUMP, TIMES ONE, USING ENDOSCOPICALLY HARVESTED RIGHT GREATER SAPHENOUS VEIN (N/A) TRANSESOPHAGEAL ECHOCARDIOGRAM (TEE) (N/A) APPLICATION OF CELL SAVER  Subjective: Resting in bed, mental status appropriate.  He has no new complaints or concerns.  Denies pain.  Says he is tolerating the diet without any trouble and is having bowel movements.`  Objective: Vital signs in last 24 hours: Temp:  [98.1 F (36.7 C)-99.1 F (37.3 C)] 98.7 F (37.1 C) (12/10 0801) Pulse Rate:  [72-83] 79 (12/10 0921) Cardiac Rhythm: Normal sinus rhythm (12/10 0701) Resp:  [17-20] 17 (12/10 0801) BP: (73-134)/(56-88) 134/88 (12/10 0921) SpO2:  [81 %-100 %] 96 % (12/10 0801) Weight:  [69.7 kg] 69.7 kg (12/10 0504)  Pre op weight  70.2 kg Current Weight  02/26/21 69.7 kg    Hemodynamic parameters for last 24 hours: CVP:  [5 mmHg-7 mmHg] 6 mmHg  Intake/Output from previous day: 12/09 0701 - 12/10 0700 In: 1302.5 [P.O.:960; I.V.:40; IV Piggyback:302.5] Out: 4400 [Urine:4400]   Physical Exam:  Cardiovascular: RRR Pulmonary: Clear Abdomen: Soft, non tender Extremities: No lower extremity edema. Partal left foot amputation Wound: Clean and dry.  No erythema or signs of infection. Neurologic: No focal deficits  Lab Results: CBC: Recent Labs    02/25/21 0430 02/26/21 0410  WBC 12.3* 13.3*  HGB 9.3* 10.1*  HCT 30.7* 33.7*  PLT 304 381    BMET:  Recent Labs    02/25/21 0430 02/26/21 0410  NA 135 132*  K 3.7 4.4  CL 101 100  CO2 24 23  GLUCOSE 123* 87  BUN 14 14  CREATININE 0.78 0.84  CALCIUM 8.5* 8.7*     PT/INR:  Lab Results  Component Value Date   INR 3.8 (H) 02/26/2021   INR 3.0 (H) 02/25/2021    INR 2.1 (H) 02/24/2021   ABG:  INR: Will add last result for INR, ABG once components are confirmed Will add last 4 CBG results once components are confirmed  Assessment/Plan:  1. CV -POD 9 mechanical mitral valve replacement for severe MR due to endocarditis and single-vessel CABG. Low EF preop.  Maintaining SR, first degree heart block. On Digoxin 0.125 mg daily, Losartan 12.5 mg bid,Coumadin 5 mg daily.  Co ox  56 , he has been off milrinone since 02/22/2021.  INR increased further to 3.8 with reduction in Coumadin to 3mg  last PM.  Will hold Coumadin tonight, likely resume tomorrow at lower dose.  The advanced heart failure team is managing cardiac medications  2.  Pulmonary - History of COPD. On room air.  With no dyspnea  3. Acute systolic CHF-management per advanced heart failure team 4.  Expected post op acute blood loss anemia - H and H trending up  5. Neuro-code stroke on 02/22/2021 for right sided weakness.  MRI of the brain showed subacute changes, no lesions to support right-sided weakness.  Symptoms nearly resolved 6. 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 Hyponatremia -mild, sodium 132.  Monitor. 8.Deconditioned-PT/OT. In terms of discharge, patient requires a total of 6 weeks of IV antibiotics. Planning discharge to home with his aunt if [facility] arrangements can  be made for ABX therapy. Otherwise plan d/c to SNF. Once Coumadin dose determined and arrangements made, will discharge  Myron G. RoddenberryPA-C 02/26/2021,9:40 AM     Patient seen and examined, agree with above Hold Coumadin today  Remo Lipps C. Roxan Hockey, MD [facility] [phone]

## 2021-02-26 NOTE — Progress Notes (Signed)
CARDIAC REHAB PHASE I   PRE:  Rate/Rhythm: 74 SR    BP: sitting 90/66    SaO2: 96 RA  MODE:  Ambulation: 410 ft   POST:  Rate/Rhythm: 86 SR    BP: sitting 123/95     SaO2: 97 RA  Pt stood independently and walked with rollator, slow and steady. Tolerated well, no rest needed, no c/o. To recliner. Encouraged IS and more walking. [phone]   Harriet Masson CES, ACSM 02/26/2021 11:46 AM
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
## 2021-02-26 NOTE — Progress Notes (Signed)
Mobility Specialist Criteria Algorithm Info.   02/26/21 1645  Mobility  Activity Ambulated in hall;Dangled on edge of bed  Range of Motion/Exercises Active;All extremities  Level of Assistance Standby assist, set-up cues, supervision of patient - no hands on  Assistive Device Four wheel walker  LLE Weight Bearing WBAT  Distance Ambulated (ft) 420 ft  Mobility Ambulated with assistance in hallway  Mobility Response Tolerated well  Mobility performed by Mobility specialist  Bed Position Semi-fowlers   Patient received dangling EOB, agreeable to participate in mobility. Ambulated in hallway supervision level with steady gait. Returned to room without complaint or incident. Was left dangling EOB with all needs met and call bell in reach.  02/26/2021 5:31 PM

## 2021-02-27 LAB — BASIC METABOLIC PANEL
Anion gap: 8 (ref 5–15)
BUN: 17 mg/dL (ref 6–20)
CO2: 25 mmol/L (ref 22–32)
Calcium: 8.8 mg/dL — ABNORMAL LOW (ref 8.9–10.3)
Chloride: 102 mmol/L (ref 98–111)
Creatinine, Ser: 0.83 mg/dL (ref 0.61–1.24)
GFR, Estimated: >60 mL/min (ref >60)
Glucose, Bld: 84 mg/dL (ref 70–99)
Potassium: 4.4 mmol/L (ref 3.5–5.1)
Sodium: 135 mmol/L (ref 135–145)

## 2021-02-27 LAB — PROTIME-INR
INR: 3.6 — ABNORMAL HIGH (ref 0.8–1.2)
Prothrombin Time: 36 seconds — ABNORMAL HIGH (ref 11.4–15.2)

## 2021-02-27 LAB — COOXEMETRY PANEL
Carboxyhemoglobin: 1.6 % — ABNORMAL HIGH (ref 0.5–1.5)
Methemoglobin: 0.8 % (ref 0.0–1.5)
O2 Saturation: 63.3 %
Total hemoglobin: 10.3 g/dL — ABNORMAL LOW (ref 12.0–16.0)

## 2021-02-27 LAB — GLUCOSE, CAPILLARY
Glucose-Capillary: 76 mg/dL (ref 70–99)
Glucose-Capillary: 95 mg/dL (ref 70–99)
Glucose-Capillary: 76 mg/dL (ref 70–99)

## 2021-02-27 LAB — CBC
HCT: 34 % — ABNORMAL LOW (ref 39.0–52.0)
Hemoglobin: 10.1 g/dL — ABNORMAL LOW (ref 13.0–17.0)
MCH: 24.2 pg — ABNORMAL LOW (ref 26.0–34.0)
MCHC: 29.7 g/dL — ABNORMAL LOW (ref 30.0–36.0)
MCV: 81.5 fL (ref 80.0–100.0)
Platelets: 426 10*3/uL — ABNORMAL HIGH (ref 150–400)
RBC: 4.17 MIL/uL — ABNORMAL LOW (ref 4.22–5.81)
RDW: 27.9 % — ABNORMAL HIGH (ref 11.5–15.5)
WBC: 13.1 10*3/uL — ABNORMAL HIGH (ref 4.0–10.5)
nRBC: 0 % (ref 0.0–0.2)

## 2021-02-27 NOTE — Progress Notes (Signed)
10 Days Post-Op Procedure(s) (LRB): REDO STERNOTOMY (N/A) MITRAL VALVE (MV) REPLACEMENT USING ON-X 27/29 MM PROSTHETIC MITRAL VALVE (N/A) CORONARY ARTERY BYPASS GRAFTING (CABG), ON PUMP, TIMES ONE, USING ENDOSCOPICALLY HARVESTED RIGHT GREATER SAPHENOUS VEIN (N/A) TRANSESOPHAGEAL ECHOCARDIOGRAM (TEE) (N/A) APPLICATION OF CELL SAVER Subjective: No complaints this AM  Objective: Vital signs in last 24 hours: Temp:  [98 F (36.7 C)-98.7 F (37.1 C)] 98 F (36.7 C) (12/11 0359) Pulse Rate:  [70-78] 71 (12/11 0936) Cardiac Rhythm: Normal sinus rhythm (12/11 0725) Resp:  [10-18] 14 (12/11 0610) BP: (83-123)/(57-95) 120/71 (12/11 0936) SpO2:  [97 %-100 %] 97 % (12/11 0610) Weight:  [69.8 kg] 69.8 kg (12/11 0610)  Hemodynamic parameters for last 24 hours: CVP:  [4 mmHg-7 mmHg] 5 mmHg  Intake/Output from previous day: 12/10 0701 - 12/11 0700 In: 1009 [P.O.:837; I.V.:9; IV Piggyback:163] Out: 1725 [Urine:1725] Intake/Output this shift: Total I/O In: 366 [P.O.:360; I.V.:6] Out: -   General appearance: alert, cooperative, and no distress Neurologic: intact Heart: regular rate and rhythm and + click Lungs: clear to auscultation bilaterally Wound: clean and dry  Lab Results: Recent Labs    02/26/21 0410 02/27/21 0445  WBC 13.3* 13.1*  HGB 10.1* 10.1*  HCT 33.7* 34.0*  PLT 381 426*   BMET:  Recent Labs    02/26/21 0410 02/27/21 0445  NA 132* 135  K 4.4 4.4  CL 100 102  CO2 23 25  GLUCOSE 87 84  BUN 14 17  CREATININE 0.84 0.83  CALCIUM 8.7* 8.8*    PT/INR:  Recent Labs    02/27/21 0445  LABPROT 36.0*  INR 3.6*   ABG    Component Value Date/Time   PHART 7.357 02/18/2021 0958   HCO3 26.7 02/18/2021 0958   TCO2 28 02/18/2021 0958   ACIDBASEDEF 3.0 (H) 02/17/2021 1448   O2SAT 63.3 02/27/2021 0445   CBG (last 3)  Recent Labs    02/26/21 1639 02/26/21 2132 02/27/21 0607  GLUCAP 103* 84 76    Assessment/Plan: S/P Procedure(s) (LRB): REDO STERNOTOMY  (N/A) MITRAL VALVE (MV) REPLACEMENT USING ON-X 27/29 MM PROSTHETIC MITRAL VALVE (N/A) CORONARY ARTERY BYPASS GRAFTING (CABG), ON PUMP, TIMES ONE, USING ENDOSCOPICALLY HARVESTED RIGHT GREATER SAPHENOUS VEIN (N/A) TRANSESOPHAGEAL ECHOCARDIOGRAM (TEE) (N/A) APPLICATION OF CELL SAVER Overall doing well In sinus rhythm Co-ox 63, CVP 5 INR 3.6 down form 3.8- hold coumadin again today On RA with good saturations Labs Ok CBG well controlled Remains on Daptomycin with stable WBC, no fevers  LOS: 31 days    Loreli Slot 02/27/2021

## 2021-02-27 NOTE — Plan of Care (Signed)
  Problem: Education: Goal: Knowledge of General Education information will improve Description: Including pain rating scale, medication(s)/side effects and non-pharmacologic comfort measures Outcome: Progressing   Problem: Health Behavior/Discharge Planning: Goal: Ability to manage health-related needs will improve Outcome: Progressing   Problem: Clinical Measurements: Goal: Ability to maintain clinical measurements within normal limits will improve Outcome: Progressing Goal: Will remain free from infection Outcome: Progressing Goal: Diagnostic test results will improve Outcome: Progressing Goal: Respiratory complications will improve Outcome: Progressing Goal: Cardiovascular complication will be avoided Outcome: Progressing   Problem: Activity: Goal: Risk for activity intolerance will decrease Outcome: Progressing   Problem: Nutrition: Goal: Adequate nutrition will be maintained Outcome: Progressing   Problem: Coping: Goal: Level of anxiety will decrease Outcome: Progressing   Problem: Elimination: Goal: Will not experience complications related to bowel motility Outcome: Progressing Goal: Will not experience complications related to urinary retention Outcome: Progressing   Problem: Pain Managment: Goal: General experience of comfort will improve Outcome: Progressing   Problem: Safety: Goal: Ability to remain free from injury will improve Outcome: Progressing   Problem: Skin Integrity: Goal: Risk for impaired skin integrity will decrease Outcome: Progressing   Problem: Education: Goal: Understanding of CV disease, CV risk reduction, and recovery process will improve Outcome: Progressing Goal: Individualized Educational Video(s) Outcome: Progressing   Problem: Activity: Goal: Ability to return to baseline activity level will improve Outcome: Progressing   Problem: Cardiovascular: Goal: Ability to achieve and maintain adequate cardiovascular perfusion  will improve Outcome: Progressing Goal: Vascular access site(s) Level 0-1 will be maintained Outcome: Progressing   Problem: Health Behavior/Discharge Planning: Goal: Ability to safely manage health-related needs after discharge will improve Outcome: Progressing   Problem: Education: Goal: Ability to demonstrate management of disease process will improve Outcome: Progressing Goal: Ability to verbalize understanding of medication therapies will improve Outcome: Progressing Goal: Individualized Educational Video(s) Outcome: Progressing   Problem: Activity: Goal: Capacity to carry out activities will improve Outcome: Progressing   Problem: Cardiac: Goal: Ability to achieve and maintain adequate cardiopulmonary perfusion will improve Outcome: Progressing   Problem: Education: Goal: Knowledge of secondary prevention will improve (SELECT ALL) Outcome: Progressing Goal: Knowledge of patient specific risk factors will improve (INDIVIDUALIZE FOR PATIENT) Outcome: Progressing

## 2021-02-28 ENCOUNTER — Telehealth (HOSPITAL_COMMUNITY): Payer: Self-pay | Admitting: Pharmacy Technician

## 2021-02-28 ENCOUNTER — Other Ambulatory Visit (HOSPITAL_COMMUNITY): Payer: Self-pay

## 2021-02-28 ENCOUNTER — Telehealth: Payer: Self-pay

## 2021-02-28 LAB — PROTIME-INR
INR: 2.9 — ABNORMAL HIGH (ref 0.8–1.2)
Prothrombin Time: 30.6 seconds — ABNORMAL HIGH (ref 11.4–15.2)

## 2021-02-28 LAB — BASIC METABOLIC PANEL
Anion gap: 9 (ref 5–15)
BUN: 19 mg/dL (ref 6–20)
CO2: 25 mmol/L (ref 22–32)
Calcium: 8.8 mg/dL — ABNORMAL LOW (ref 8.9–10.3)
Chloride: 101 mmol/L (ref 98–111)
Creatinine, Ser: 0.79 mg/dL (ref 0.61–1.24)
GFR, Estimated: >60 mL/min (ref >60)
Glucose, Bld: 90 mg/dL (ref 70–99)
Potassium: 4.4 mmol/L (ref 3.5–5.1)
Sodium: 135 mmol/L (ref 135–145)

## 2021-02-28 LAB — GLUCOSE, CAPILLARY
Glucose-Capillary: 85 mg/dL (ref 70–99)
Glucose-Capillary: 86 mg/dL (ref 70–99)
Glucose-Capillary: 78 mg/dL (ref 70–99)
Glucose-Capillary: 90 mg/dL (ref 70–99)
Glucose-Capillary: 87 mg/dL (ref 70–99)

## 2021-02-28 LAB — CBC
HCT: 34.4 % — ABNORMAL LOW (ref 39.0–52.0)
Hemoglobin: 10.1 g/dL — ABNORMAL LOW (ref 13.0–17.0)
MCH: 24.2 pg — ABNORMAL LOW (ref 26.0–34.0)
MCHC: 29.4 g/dL — ABNORMAL LOW (ref 30.0–36.0)
MCV: 82.5 fL (ref 80.0–100.0)
Platelets: 448 10*3/uL — ABNORMAL HIGH (ref 150–400)
RBC: 4.17 MIL/uL — ABNORMAL LOW (ref 4.22–5.81)
RDW: 27.4 % — ABNORMAL HIGH (ref 11.5–15.5)
WBC: 15 10*3/uL — ABNORMAL HIGH (ref 4.0–10.5)
nRBC: 0 % (ref 0.0–0.2)

## 2021-02-28 LAB — COOXEMETRY PANEL
Carboxyhemoglobin: 1.7 % — ABNORMAL HIGH (ref 0.5–1.5)
Methemoglobin: 0.7 % (ref 0.0–1.5)
O2 Saturation: 62.9 %
Total hemoglobin: 12.2 g/dL (ref 12.0–16.0)

## 2021-02-28 LAB — CK: Total CK: 23 U/L — ABNORMAL LOW (ref 49–397)

## 2021-02-28 MED ORDER — WARFARIN SODIUM 2 MG PO TABS
2.0000 mg | ORAL_TABLET | Freq: Once | ORAL | Status: AC
  Administered 2021-02-28: 2 mg via ORAL
  Filled 2021-02-28: qty 1

## 2021-02-28 NOTE — Telephone Encounter (Signed)
Advanced Heart Failure Patient Advocate Encounter  Patient is currently uninsured and will be discharged to the AHF clinic. Started applications for [employer]) and Leisure [employer] manager ([employer]).  Of note, patient will need to submit proof of income for [facility] assistance. Patient will need to apply for and be denied Medicaid in order to go through processing for [place] assistance.

## 2021-02-28 NOTE — TOC CM/SW Note (Addendum)
03/01/2021 1230 pm [facility] has declined referral for [facility] due staffing issues in the [place] office. Isidoro Donning RN3 CCM, Heart Failure TOC CM [phone]   03/01/2021 830 am [facility] unable to staff due to staffing in his area. Contacted [facility] HH rep, Amy, the charity agency this week with new referral. Left message for return call.   Isidoro Donning RN3 CCM, Heart Failure TOC CM [phone]    02/28/2021 3:26 pm HF TOC CM working on securing [facility] RN for IV abx until 03/30/2021 with approved LOG from [facility] Director, [employer]. Contacted [facility] rep, Pearson Grippe. Unable to accept LOG due to staffing.  Contacted [facility] rep, Stacie. Unable to accept LOG due to staffing.  Contacted [facility] rep, Eber Jones. Out of service area. Contacted [facility]. Left message and sent secure email. Waiting response.  Contacted [employer], Cory. Will check on staffing for that area.  Will give CM call back.   Isidoro Donning RN3 CCM, Heart Failure TOC CM [phone]

## 2021-02-28 NOTE — Progress Notes (Signed)
Occupational Therapy Treatment Patient Details Name: Mark Garrett MRN: [id number] DOB: February 03, 1981 Today's Date: 02/28/2021   History of present illness Pt is a 40 y.o. male admitted 01/27/21 due to CHF exacerbation. S/p R/L heart cath and coronary graft angiography on 11/14. 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 MVR and CABG x 1 on 12/1. Code stroke called on 12/6 after pt could not feel his rt side. Possible TIA or hypoxia. PMH includes CHF, CAD (s/p CABG 2006), mitral valve disease, traumatic amputation of L foot.   OT comments  Patient received supine in bed and was able to get to EOB without assistance. Patient was able to perform grooming tasks standing at sink with no assistance to open containers.  Transfer training performed from EOB to [facility] with patient demonstrating good understanding of precautions. Sternal precautions reviewed with patient able to recite 3/3 precautions. Acute OT to continue to follow.    Recommendations for follow up therapy are one component of a multi-disciplinary discharge planning process, led by the attending physician.  Recommendations may be updated based on patient status, additional functional criteria and insurance authorization.    Follow Up Recommendations  No OT follow up    Assistance Recommended at Discharge PRN  Equipment Recommendations  None recommended by OT    Recommendations for Other Services      Precautions / Restrictions Precautions Precautions: Fall;Sternal Precaution Booklet Issued: Yes (comment) Precaution Comments: h/o L forefoot amputation (does not have prosthetic in room; ambulates with/without it at home) Restrictions Weight Bearing Restrictions: No LLE Weight Bearing: Weight bearing as tolerated       Mobility Bed Mobility Overal bed mobility: Modified Independent Bed Mobility: Supine to Sit     Supine to sit: Modified independent (Device/Increase time) Sit to  supine: Modified independent (Device/Increase time)   General bed mobility comments: did not use bed rail    Transfers Overall transfer level: Modified independent Equipment used: Rollator (4 wheels) Transfers: Sit to/from Stand Sit to Stand: Supervision           General transfer comment: toilet transfers performed from EOB to [facility]     Balance Overall balance assessment: Needs assistance Sitting-balance support: No upper extremity supported;Feet supported Sitting balance-Leahy Scale: Good     Standing balance support: No upper extremity supported;During functional activity Standing balance-Leahy Scale: Fair Standing balance comment: able to stand at sink for grooming and self care tasks                           ADL either performed or assessed with clinical judgement   ADL Overall ADL's : Needs assistance/impaired     Grooming: Wash/dry hands;Wash/dry face;Oral care;Brushing hair;Set up;Standing Grooming Details (indicate cue type and reason): No difficulties this date with opening containers                 Toilet Transfer: Supervision/safety;BSC/3in1 Toilet Transfer Details (indicate cue type and reason): demonstrated good understanding of sternal precautions with transfers         Functional mobility during ADLs: Supervision/safety;Rollator (4 wheels) General ADL Comments: demonstrated good safety and adhered to precautions    Extremity/Trunk Assessment Upper Extremity Assessment RUE Deficits / Details: new coordination deficits, denies sensation impairments. difficulty with finger to nose test, slow speed and under/overshooting target. RUE Coordination: decreased fine motor LUE Coordination: decreased fine motor            Vision  Perception     Praxis      Cognition Arousal/Alertness: Awake/alert Behavior During Therapy: Flat affect Overall Cognitive Status: No family/caregiver present to determine baseline cognitive  functioning                                 General Comments: able to recall sternal precautions          Exercises     Shoulder Instructions       General Comments      Pertinent Vitals/ Pain       Pain Assessment: No/denies pain  Home Living                                          Prior Functioning/Environment              Frequency  Min 2X/week        Progress Toward Goals  OT Goals(current goals can now be found in the care plan section)  Progress towards OT goals: Progressing toward goals  Acute Rehab OT Goals Patient Stated Goal: go home OT Goal Formulation: With patient Time For Goal Achievement: 03/01/21 Potential to Achieve Goals: Good ADL Goals Pt Will Transfer to Toilet: Independently;ambulating Pt Will Perform Tub/Shower Transfer: Tub transfer;Independently;ambulating Pt/caregiver will Perform Home Exercise Program: Both right and left upper extremity;With theraputty;Independently Additional ADL Goal #1: Pt to verbalize at least 3 fall prevention strategies Additional ADL Goal #2: Pt to demo implementation of sternal precautions in all ADLs/mobility with Modified Independence  Plan Discharge plan remains appropriate    Co-evaluation                 AM-PAC OT "6 Clicks" Daily Activity     Outcome Measure   Help from another person eating meals?: None Help from another person taking care of personal grooming?: A Little Help from another person toileting, which includes using toliet, bedpan, or urinal?: A Little Help from another person bathing (including washing, rinsing, drying)?: A Little Help from another person to put on and taking off regular upper body clothing?: None Help from another person to put on and taking off regular lower body clothing?: A Little 6 Click Score: 20    End of Session Equipment Utilized During Treatment: Rollator (4 wheels)  OT Visit Diagnosis: Unsteadiness on feet  (R26.81);Other abnormalities of gait and mobility (R26.89)   Activity Tolerance Patient tolerated treatment well   Patient Left in bed;with call bell/phone within reach   Nurse Communication Mobility status        Time: 2637-8588 OT Time Calculation (min): 21 min  Charges: OT General Charges $OT Visit: 1 Visit OT Treatments $Self Care/Home Management : 8-22 mins  Alfonse Flavors, OTA Acute Rehabilitation Services  Pager [phone] Office [phone]   Dewain Penning 02/28/2021, 11:37 AM

## 2021-02-28 NOTE — TOC Progression Note (Addendum)
Transition of Care [facility]) - Progression Note    Patient Details  Name: Mark Garrett MRN: [id number] Date of Birth: 02/24/1981  Transition of [facility]) CM/SW Contact  Chloe Baig, LCSW Phone Number: 02/28/2021, 12:24 PM  Clinical Narrative:    HF CSW spoke with Fabio Neighbors with [employer] regarding Mr. Poe Medicaid application and she said the Medicaid was just sent to [facility] today to be assigned a DSS caseworker which can take weeks for the case to be assigned. Still need home health agency set up. TOC Supervisor Zack reported in [employer] meeting Thursday 02/24/21 will pay for home health needs and needed DME.  CSW will continue to follow throughout discharge.   Expected Discharge Plan: Home/Self Care Barriers to Discharge: Continued Medical Work up  Expected Discharge Plan and Services Expected Discharge Plan: Home/Self Care In-house Referral: Clinical Social Work Discharge Planning Services: CM Consult, Indigent Health Clinic, MATCH Program, Medication Assistance, Follow-up appt scheduled   Living arrangements for the past 2 months: Single Family Home                   DME Agency: NA       HH Arranged: NA           Social Determinants of Health (SDOH) Interventions Food Insecurity Interventions: Other (Comment) (Patient has food stamps $250 a month) Financial Strain Interventions: Intervention Not Indicated, Artist (referral to [employer] to screen for Medicaid, patient agreeable) Housing Interventions: Intervention Not Indicated Transportation Interventions: Retail banker  Readmission Risk Interventions No flowsheet data found.  Payslee Bateson, MSW, LCSWA [phone] Heart Failure Social Worker

## 2021-02-28 NOTE — Progress Notes (Signed)
Mobility Specialist Progress Note    02/28/21 1436  Mobility  Activity Ambulated in hall  Level of Assistance Modified independent, requires aide device or extra time  Assistive Device Four wheel walker  Distance Ambulated (ft) 420 ft  Mobility Ambulated independently in hallway  Mobility Response Tolerated well  Mobility performed by Mobility specialist  $Mobility charge 1 Mobility   Pt received in bed and agreeable. No complaints on walk. Returned to sitting EOB with call bell in reach.   [facility] Mobility Specialist  M.S. Primary Phone: [phone] M.S. Secondary Phone: [phone]

## 2021-02-28 NOTE — Telephone Encounter (Signed)
Call made to conduct screening for transportation needs. Was unable to get in contact with patient and vmb was not set up to leave message.
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
## 2021-02-28 NOTE — Progress Notes (Signed)
CARDIAC REHAB PHASE I   PRE:  Rate/Rhythm: 76 SR  BP:  Supine:   Sitting: 130/77  Standing:    SaO2:   MODE:  Ambulation: 440 ft   POST:  Rate/Rhythm: 78 SR  BP:  Supine:   Sitting: 139/88  Standing:    SaO2:  Much stronger, only needs assistance to walk with a rolling walker due to equipment. Able to demonstrate IS to 1400.   [id number]  Cindra Eves RN, BSN 02/28/2021 9:48 AM

## 2021-02-28 NOTE — Progress Notes (Addendum)
      [address]       [place] [address]             [phone]        11 Days Post-Op Procedure(s) (LRB): REDO STERNOTOMY (N/A) MITRAL VALVE (MV) REPLACEMENT USING ON-X 27/29 MM PROSTHETIC MITRAL VALVE (N/A) CORONARY ARTERY BYPASS GRAFTING (CABG), ON PUMP, TIMES ONE, USING ENDOSCOPICALLY HARVESTED RIGHT GREATER SAPHENOUS VEIN (N/A) TRANSESOPHAGEAL ECHOCARDIOGRAM (TEE) (N/A) APPLICATION OF CELL SAVER  Subjective: Patient without complaints this am  Objective: Vital signs in last 24 hours: Temp:  [98 F (36.7 C)-98.3 F (36.8 C)] 98.2 F (36.8 C) (12/12 0400) Pulse Rate:  [68-72] 68 (12/12 0006) Cardiac Rhythm: Normal sinus rhythm;Heart block;Bundle branch block (12/11 1900) Resp:  [16-19] 16 (12/12 0400) BP: (92-120)/(48-71) 95/48 (12/12 0400) SpO2:  [95 %-100 %] 99 % (12/12 0400) Weight:  [70.6 kg] 70.6 kg (12/12 0400)  Pre op weight  70.2 kg Current Weight  02/28/21 70.6 kg    Hemodynamic parameters for last 24 hours: CVP:  [5 mmHg-8 mmHg] 5 mmHg  Intake/Output from previous day: 12/11 0701 - 12/12 0700 In: 1006 [P.O.:837; I.V.:6; IV Piggyback:163] Out: 2450 [Urine:2450]   Physical Exam:  Cardiovascular: RRR, sharp valve click Pulmonary: Clear Abdomen: Soft, non tender, bowel sounds present. Extremities: No lower extremity edema. Partal left foot amputation Wound: Clean and dry.  No erythema or signs of infection. Neurologic: Grossly intact without focal deficits  Lab Results: CBC: Recent Labs    02/27/21 0445 02/28/21 0442  WBC 13.1* 15.0*  HGB 10.1* 10.1*  HCT 34.0* 34.4*  PLT 426* 448*    BMET:  Recent Labs    02/27/21 0445 02/28/21 0442  NA 135 135  K 4.4 4.4  CL 102 101  CO2 25 25  GLUCOSE 84 90  BUN 17 19  CREATININE 0.83 0.79  CALCIUM 8.8* 8.8*     PT/INR:  Lab Results  Component Value Date   INR 2.9 (H) 02/28/2021   INR 3.6 (H) 02/27/2021   INR 3.8 (H) 02/26/2021   ABG:  INR: Will add last  result for INR, ABG once components are confirmed Will add last 4 CBG results once components are confirmed  Assessment/Plan:  1. CV - SR, first degree heart block. On Toprol XL 12.5 mg daily, Digoxin 0.125 mg daily, Entresto po bid.INR this am decreased from 3.6 to 2.9.  He has not had Coumadin since 12/09. Will give 2 mg tonight. Co ox this am stable at 62.9; Milrinone stopped 12/06.PAH-on Sildenafil 40 mg tid  2.  Pulmonary - History of COPD. On room air. Encourage incentive spirometer. 3. Acute systolic CHF-on Spironolactone 25 mg daily 4.  Expected post op acute blood loss anemia - H and H this am stable at 10.1 and 34.4 5. Neuro-Etiology of events likely cardio embolic.  6. 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 In terms of discharge, patient requires a total of 6 weeks of IV antibiotics. Once Coumadin dose determined and arrangements made, will discharge  Donielle M ZimmermanPA-C 02/28/2021,7:34 AM     Chart reviewed, patient examined, agree with above. Feels and looks much better. Ambulating with walker.  Coumadin 2 mg tonight. I suspect he will need 3 mg daily.  Working on home antibiotics.

## 2021-02-28 NOTE — Plan of Care (Signed)
  Problem: Education: Goal: Knowledge of General Education information will improve Description: Including pain rating scale, medication(s)/side effects and non-pharmacologic comfort measures Outcome: Progressing   Problem: Health Behavior/Discharge Planning: Goal: Ability to manage health-related needs will improve Outcome: Progressing   Problem: Clinical Measurements: Goal: Ability to maintain clinical measurements within normal limits will improve Outcome: Progressing Goal: Will remain free from infection Outcome: Progressing Goal: Diagnostic test results will improve Outcome: Progressing Goal: Respiratory complications will improve Outcome: Not Applicable Goal: Cardiovascular complication will be avoided Outcome: Progressing   Problem: Activity: Goal: Risk for activity intolerance will decrease Outcome: Progressing   Problem: Nutrition: Goal: Adequate nutrition will be maintained Outcome: Completed/Met   Problem: Coping: Goal: Level of anxiety will decrease Outcome: Completed/Met   Problem: Elimination: Goal: Will not experience complications related to bowel motility Outcome: Completed/Met Goal: Will not experience complications related to urinary retention Outcome: Completed/Met   Problem: Pain Managment: Goal: General experience of comfort will improve Outcome: Completed/Met

## 2021-03-01 ENCOUNTER — Other Ambulatory Visit (HOSPITAL_COMMUNITY): Payer: Self-pay

## 2021-03-01 LAB — COOXEMETRY PANEL
Carboxyhemoglobin: 1.8 % — ABNORMAL HIGH (ref 0.5–1.5)
Methemoglobin: 0.6 % (ref 0.0–1.5)
O2 Saturation: 61.9 %
Total hemoglobin: 10.2 g/dL — ABNORMAL LOW (ref 12.0–16.0)

## 2021-03-01 LAB — BASIC METABOLIC PANEL
Anion gap: 9 (ref 5–15)
BUN: 18 mg/dL (ref 6–20)
CO2: 25 mmol/L (ref 22–32)
Calcium: 9 mg/dL (ref 8.9–10.3)
Chloride: 101 mmol/L (ref 98–111)
Creatinine, Ser: 0.84 mg/dL (ref 0.61–1.24)
GFR, Estimated: >60 mL/min (ref >60)
Glucose, Bld: 88 mg/dL (ref 70–99)
Potassium: 4.5 mmol/L (ref 3.5–5.1)
Sodium: 135 mmol/L (ref 135–145)

## 2021-03-01 LAB — CBC
HCT: 34.3 % — ABNORMAL LOW (ref 39.0–52.0)
Hemoglobin: 9.9 g/dL — ABNORMAL LOW (ref 13.0–17.0)
MCH: 23.9 pg — ABNORMAL LOW (ref 26.0–34.0)
MCHC: 28.9 g/dL — ABNORMAL LOW (ref 30.0–36.0)
MCV: 82.7 fL (ref 80.0–100.0)
Platelets: 476 10*3/uL — ABNORMAL HIGH (ref 150–400)
RBC: 4.15 MIL/uL — ABNORMAL LOW (ref 4.22–5.81)
RDW: 27 % — ABNORMAL HIGH (ref 11.5–15.5)
WBC: 13.6 10*3/uL — ABNORMAL HIGH (ref 4.0–10.5)
nRBC: 0 % (ref 0.0–0.2)

## 2021-03-01 LAB — PROTIME-INR
INR: 2.7 — ABNORMAL HIGH (ref 0.8–1.2)
Prothrombin Time: 28.4 seconds — ABNORMAL HIGH (ref 11.4–15.2)

## 2021-03-01 LAB — GLUCOSE, CAPILLARY
Glucose-Capillary: 78 mg/dL (ref 70–99)
Glucose-Capillary: 85 mg/dL (ref 70–99)

## 2021-03-01 MED ORDER — WARFARIN SODIUM 3 MG PO TABS
3.0000 mg | ORAL_TABLET | Freq: Every day | ORAL | 1 refills | Status: DC
  Filled 2021-03-01: qty 30, 30d supply, fill #0

## 2021-03-01 MED ORDER — CEFTRIAXONE IV (FOR PTA / DISCHARGE USE ONLY): 2.0000 g | INTRAVENOUS | 0 refills | Status: AC

## 2021-03-01 MED ORDER — TRAMADOL HCL 50 MG PO TABS
50.0000 mg | ORAL_TABLET | Freq: Four times a day (QID) | ORAL | 0 refills | Status: DC | PRN
  Filled 2021-03-01: qty 28, 7d supply, fill #0

## 2021-03-01 MED ORDER — DAPAGLIFLOZIN PROPANEDIOL 10 MG PO TABS
10.0000 mg | ORAL_TABLET | Freq: Every day | ORAL | 1 refills | Status: DC
  Filled 2021-03-01: qty 30, 30d supply, fill #0

## 2021-03-01 MED ORDER — SACUBITRIL-VALSARTAN 24-26 MG PO TABS
1.0000 | ORAL_TABLET | Freq: Two times a day (BID) | ORAL | 1 refills | Status: DC
Start: 2021-03-01 — End: 2021-03-08
  Filled 2021-03-01: qty 60, 30d supply, fill #0

## 2021-03-01 MED ORDER — SPIRONOLACTONE 25 MG PO TABS
25.0000 mg | ORAL_TABLET | Freq: Every day | ORAL | 1 refills | Status: DC
  Filled 2021-03-01: qty 30, 30d supply, fill #0

## 2021-03-01 MED ORDER — WARFARIN SODIUM 3 MG PO TABS
3.0000 mg | ORAL_TABLET | Freq: Every day | ORAL | Status: DC
  Administered 2021-03-01: 3 mg via ORAL
  Filled 2021-03-01: qty 1

## 2021-03-01 MED ORDER — METOPROLOL SUCCINATE ER 25 MG PO TB24
12.5000 mg | ORAL_TABLET | Freq: Every day | ORAL | 1 refills | Status: DC
  Filled 2021-03-01: qty 15, 30d supply, fill #0

## 2021-03-01 MED ORDER — SILDENAFIL CITRATE 20 MG PO TABS
40.0000 mg | ORAL_TABLET | Freq: Three times a day (TID) | ORAL | 1 refills | Status: DC
  Filled 2021-03-01: qty 180, 30d supply, fill #0

## 2021-03-01 MED ORDER — ADULT MULTIVITAMIN W/MINERALS CH: 1.0000 | ORAL_TABLET | Freq: Every day | ORAL | Status: DC

## 2021-03-01 MED ORDER — HEPARIN SOD (PORK) LOCK FLUSH 100 UNIT/ML IV SOLN
250.0000 [IU] | INTRAVENOUS | Status: AC | PRN
  Administered 2021-03-01 (×3): 250 [IU]
  Filled 2021-03-01: qty 2.5

## 2021-03-01 MED ORDER — ENSURE ENLIVE PO LIQD: 237.0000 mL | Freq: Three times a day (TID) | ORAL | 12 refills | Status: DC

## 2021-03-01 MED ORDER — DAPTOMYCIN IV (FOR PTA / DISCHARGE USE ONLY): 650.0000 mg | INTRAVENOUS | 0 refills | Status: AC

## 2021-03-01 MED ORDER — DIGOXIN 125 MCG PO TABS
0.1250 mg | ORAL_TABLET | Freq: Every day | ORAL | 1 refills | Status: DC
  Filled 2021-03-01: qty 30, 30d supply, fill #0

## 2021-03-01 MED ORDER — ROSUVASTATIN CALCIUM 40 MG PO TABS
40.0000 mg | ORAL_TABLET | Freq: Every day | ORAL | 1 refills | Status: DC
  Filled 2021-03-01: qty 30, 30d supply, fill #0

## 2021-03-01 NOTE — Progress Notes (Signed)
4 sutures  on the upper abd. Removed , tolerated well. Site is clean and dry.

## 2021-03-01 NOTE — Progress Notes (Signed)
VAST consult received to cap and flush patient's PICC for discharge. Upon arrival at patient's bedside, noted patient's PICC to be triple lumen. Spoke with Jeri Modena, Infusion educator and Ponciano Ort, PA-C; both in agreement that patient has to be discharged today and cannot wait for line exchange. Order written for patient to be discharged with TL PICC in place.  All three lumens of LA PICC flushed per protocol as charted in flowsheets and MAR.
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
## 2021-03-01 NOTE — Progress Notes (Signed)
[facility] [employer] Ride scheduled for [name] for discharge with [employer] on [employer]. See details below. One-way Ride ID: [id number] Passenger Name: Mark Garrett Passenger Phone: [phone] Ride Date: 03/01/2021 Daryll Drown Time: 06:00 PM Pickup Address: [address], [place], [place] [address] Drop-off Address: [address] [place] [address] Transportation Type: Standard Vehicle: Door-through-Door Investment banker, corporate: Please go to the Main Entrance of [facility] (where the [employer] is located) to find passenger, thank you!

## 2021-03-01 NOTE — Progress Notes (Signed)
Nutrition Follow-up  DOCUMENTATION CODES:   Severe malnutrition in context of chronic illness (component of social-environmental as well)  INTERVENTION:   - Continue Ensure Enlive po TID, each supplement provides 350 kcal and 20 grams of protein  - Continue MVI with minerals daily  NUTRITION DIAGNOSIS:   Severe Malnutrition related to chronic illness (CHF exacerbation) as evidenced by percent weight loss, severe fat depletion, energy intake < 75% for > or equal to 1 month.  Ongoing, being addressed via oral nutrition supplements  GOAL:   Patient will meet greater than or equal to 90% of their needs  Progressing  MONITOR:   PO intake, Supplement acceptance, Labs, Weight trends, I & O's  REASON FOR ASSESSMENT:   Consult Assessment of nutrition requirement/status  ASSESSMENT:   Pt with PMH significant for asthma, COPD, tobacco abuse, s/p aortic revascularization, and DVT admitted with acute hypoxemic respiratory failure 2/2 acute systolic CHF exacerbation.  11/22 - dental extraction of 10 teeth 12/01 - s/p MV replacement, CABG x 1 12/02 - extubation 12/03 - ECHO: EF 25-30%, mod dec RV function 12/06 - Code Stroke, NPO 12/08 - negative transcranial doppler bubble study  Spoke with pt at bedside. Pt with 100% completed lunch meal tray at bedside. Pt reports appetite is good and that he is eating well. Pt reports consuming 2-3 Ensures supplements daily and is tolerating these well. Pt denies any nutrition-related issues at this time. Will continue with current diet order and oral nutrition supplements.  Admit weight: 81.3 kg Current weight: 70.8 kg  Meal Completion: 100% x last 8 documented meals  Medications reviewed and include: farxiga, Ensure Enlive TID, MVI with minerals, protonix, spironolactone, warfarin, IV abx  Labs reviewed: hemoglobin 9.9, WBC 13.6 CBG's: 76-95 x 24 hours  UOP: 1925 ml x 24 hours I/O's: -68.0 L since admit  Diet Order:   Diet Order              DIET DYS 3 Room service appropriate? Yes; Fluid consistency: Thin; Fluid restriction: 1800 mL Fluid  Diet effective now                   EDUCATION NEEDS:   No education needs have been identified at this time  Skin:  Skin Assessment: Skin Integrity Issues: Stage I: buttocks Incisions: chest, R leg  Last BM:  02/26/21  Height:   Ht Readings from Last 1 Encounters:  02/17/21 5\' 11"  (1.803 m)    Weight:   Wt Readings from Last 1 Encounters:  03/01/21 70.8 kg    BMI:  Body mass index is 21.77 kg/m.  Estimated Nutritional Needs:   Kcal:  2000-2200  Protein:  100-110 grams  Fluid:  >/=2L    03/03/21, MS, RD, LDN Inpatient Clinical Dietitian Please see AMiON for contact information.

## 2021-03-01 NOTE — TOC CM/SW Note (Addendum)
438 pm [facility] has received LOG paperwork and they will do start of care 03/02/2021. [facility] will deliver medication to home today. [facility] will work with [facility] on INR labwork. Pt has medications from [facility] pharmacy. [facility] has delivered his DME. Isidoro Donning RN3 CCM, Heart Failure TOC CM [phone]   1252 pm HF TOC CM contacted [facility] and they have accepted the LOG rate for [facility] visits with labwork. Completed paperwork and contract will be submitted to [facility] Director for approval and sent to [facility]. They can do a start of care for 03/02/2021. [facility] IV Infusion Coordinator, Pam RN updated. Attending made aware. Isidoro Donning RN3 CCM, Heart Failure TOC CM [phone]

## 2021-03-01 NOTE — Progress Notes (Signed)
Patient discharged with belongings. Benedetto Goad to pick up patient. Waiting at [facility] entrance with Child psychotherapist.

## 2021-03-01 NOTE — Progress Notes (Addendum)
      [address]       [place] [address]             [phone]        12 Days Post-Op Procedure(s) (LRB): REDO STERNOTOMY (N/A) MITRAL VALVE (MV) REPLACEMENT USING ON-X 27/29 MM PROSTHETIC MITRAL VALVE (N/A) CORONARY ARTERY BYPASS GRAFTING (CABG), ON PUMP, TIMES ONE, USING ENDOSCOPICALLY HARVESTED RIGHT GREATER SAPHENOUS VEIN (N/A) TRANSESOPHAGEAL ECHOCARDIOGRAM (TEE) (N/A) APPLICATION OF CELL SAVER  Subjective: Patient listening to music and without complaints this am  Objective: Vital signs in last 24 hours: Temp:  [97.7 F (36.5 C)-98.4 F (36.9 C)] 97.9 F (36.6 C) (12/13 0711) Pulse Rate:  [65-83] 83 (12/13 0711) Cardiac Rhythm: Normal sinus rhythm (12/13 0430) Resp:  [16-19] 18 (12/13 0711) BP: (91-127)/(53-85) 127/85 (12/13 0711) SpO2:  [57 %-99 %] 57 % (12/13 0711) Weight:  [70.8 kg] 70.8 kg (12/13 0500)  Pre op weight  70.2 kg Current Weight  03/01/21 70.8 kg    Hemodynamic parameters for last 24 hours: CVP:  [2 mmHg-8 mmHg] 4 mmHg  Intake/Output from previous day: 12/12 0701 - 12/13 0700 In: 1263 [P.O.:960; I.V.:40; IV Piggyback:263] Out: 1925 [Urine:1925]   Physical Exam:  Cardiovascular: RRR, sharp valve click Pulmonary: Clear Abdomen: Soft, non tender, bowel sounds present. Extremities: No lower extremity edema. Partal left foot amputation Wound: Clean and dry.  No erythema or signs of infection. Neurologic: Grossly intact without focal deficits  Lab Results: CBC: Recent Labs    02/28/21 0442 03/01/21 0430  WBC 15.0* 13.6*  HGB 10.1* 9.9*  HCT 34.4* 34.3*  PLT 448* 476*    BMET:  Recent Labs    02/28/21 0442 03/01/21 0430  NA 135 135  K 4.4 4.5  CL 101 101  CO2 25 25  GLUCOSE 90 88  BUN 19 18  CREATININE 0.79 0.84  CALCIUM 8.8* 9.0     PT/INR:  Lab Results  Component Value Date   INR 2.7 (H) 03/01/2021   INR 2.9 (H) 02/28/2021   INR 3.6 (H) 02/27/2021   ABG:  INR: Will add last result for  INR, ABG once components are confirmed Will add last 4 CBG results once components are confirmed  Assessment/Plan:  1. CV - SR, first degree heart block. On Toprol XL 12.5 mg daily, Digoxin 0.125 mg daily, Entresto po bid.INR this am decreased from 2.9 to 2.7.  He had Coumadin 2 mg last night. Will give 3 mg tonight. Co ox this am stable at 61.9; Milrinone stopped 12/06.PAH-on Sildenafil 40 mg tid and on Farxiga 10 mg daily as of 12/12. 2.  Pulmonary - History of COPD. On room air. Encourage incentive spirometer. 3. Acute systolic CHF-on Spironolactone 25 mg daily 4.  Expected post op acute blood loss anemia - H and H this am stable at 9.9 and 34.3 5. Neuro-Etiology of events likely cardio embolic.  6. 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 In terms of discharge, patient requires a total of 6 weeks of IV antibiotics. Once Coumadin dose determined and HH arrangements made, will discharge  Donielle M ZimmermanPA-C 03/01/2021,7:17 AM      Chart reviewed, patient examined, agree with above. He is doing well. OK to send home with antibiotics. Coumadin 3 mg daily.

## 2021-03-01 NOTE — Progress Notes (Signed)
Transportation scheduled  for 6 pm did not arrive.  CM alesia made aware and claimed to have MSW  from ED to make transport arrangement.

## 2021-03-01 NOTE — Progress Notes (Signed)
8 pm dose of daptocin iv given early in order not to miss dose today. Awaiting transport home. From [place] at 6 pm.

## 2021-03-03 ENCOUNTER — Telehealth: Payer: Self-pay | Admitting: *Deleted

## 2021-03-03 NOTE — Telephone Encounter (Signed)
Called [facility] regarding the pt being discharged from the Hospital and needing an INR per hospital discharge. Spoke with Revonda Standard and she states there was a lot of orders but no INR, she listed many labs and called them all out and stated that was not given and the pt had been seen for this week. I assessed the discharge plan and read that it was due to be obtained today. She stated they would be happy to assist at the next visit on Monday or Tuesday of next week when they go to visit. If so, will need to fax an order to have it done on Monday, fax number, [phone] with attention to [facility]. Will call the pt to see if he is available to come in the office for a visit.  Pt has transportation issues so unable to drive here. Order faxed to obtain Monday and staff aware to follow up.   Per Staff message:  Hi CVRR team  This is a patient of Dr Shirlee Latch  New Onx MVR  - new warfarin INR goal 2.5-3.5  Will get HHC at DC  - bc also with IV ABX at DC  then prob eventually to you in office.

## 2021-03-04 ENCOUNTER — Telehealth (HOSPITAL_COMMUNITY): Payer: Self-pay

## 2021-03-04 NOTE — Telephone Encounter (Signed)
Per phase I cardiac rehab, fax cardiac rehab referral to [place] [facility]. °

## 2021-03-07 LAB — PROTIME-INR: INR: 10 — AB (ref 0.9–1.1)

## 2021-03-07 NOTE — Patient Outreach (Signed)
Received an Emmi Stroke red flag notification.  I have assigned Marval Regal, RN for follow up.

## 2021-03-07 NOTE — Telephone Encounter (Signed)
Sent [place] in application via fax.  Will follow up.   Will send in [employer] application once all signatures are received.
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
## 2021-03-08 ENCOUNTER — Inpatient Hospital Stay: Payer: Medicaid Other | Admitting: Internal Medicine

## 2021-03-08 ENCOUNTER — Telehealth: Payer: Self-pay

## 2021-03-08 ENCOUNTER — Encounter (HOSPITAL_COMMUNITY): Payer: Medicaid Other

## 2021-03-08 ENCOUNTER — Other Ambulatory Visit (HOSPITAL_COMMUNITY): Payer: Self-pay

## 2021-03-08 ENCOUNTER — Ambulatory Visit (INDEPENDENT_AMBULATORY_CARE_PROVIDER_SITE_OTHER): Payer: Self-pay

## 2021-03-08 DIAGNOSIS — Z7901 Long term (current) use of anticoagulants: Secondary | ICD-10-CM

## 2021-03-08 DIAGNOSIS — I639 Cerebral infarction, unspecified: Secondary | ICD-10-CM

## 2021-03-08 DIAGNOSIS — Z952 Presence of prosthetic heart valve: Secondary | ICD-10-CM

## 2021-03-08 DIAGNOSIS — I82403 Acute embolism and thrombosis of unspecified deep veins of lower extremity, bilateral: Secondary | ICD-10-CM

## 2021-03-08 MED ORDER — SACUBITRIL-VALSARTAN 24-26 MG PO TABS: 1.0000 | ORAL_TABLET | Freq: Two times a day (BID) | ORAL | 3 refills | Status: DC

## 2021-03-08 NOTE — Patient Instructions (Signed)
Description   [facility] RN called with critical INR results.  Pt's INR was drawn via venipuncture yesterday 03/07/21, INR >10.0 called to their office today 03/08/21.  Pt is new to our clinic, and has new On-X valve (INR goal 2.5-3.5).  Advised HH RN pt needs to hold Warfarin and go to the ED for INR check and may need vit K reversal, but given new valve needs to be assessed by MD.  They are going to call pt and advise to go to ED.  Revonda Standard RN at [employer] pt is at hospital ED near [place].

## 2021-03-08 NOTE — Telephone Encounter (Signed)
Received call from [facility]) with patient coumadin level of 10. Staff sent secure chat message to Coumadin team which has been addressed by RN of CVD.   [facility] also reports from [facility] nurse that patient has been noncompliant with antibiotic therapy. Patient due to follow up in the office 12/30. Routing to provider for advise.  Valarie Cones

## 2021-03-09 ENCOUNTER — Telehealth: Payer: Self-pay | Admitting: *Deleted

## 2021-03-09 ENCOUNTER — Other Ambulatory Visit: Payer: Self-pay | Admitting: *Deleted

## 2021-03-09 NOTE — Patient Outreach (Signed)
[facility]) Care Management  03/09/2021  Teng Decou [facility] Oct 22, 1980 [id number]   [facility] Unsuccessful outreach  Mr Shahzad Thomann was referred to [facility] for EMMI stroke follow up for a red flag on Friday 03/04/21 10 am for scheduled a follow up appointment? No after his discharged listed from [facility] per referral     Outreach attempt to the listed at the preferred outreach number in EPIC [phone] No answer. Automated message states patient does not have a voice mail box set up to allow RN CM to leave a message  Pt does not have an e-mail listed in epic nor my chart set up NO pcp listed on EPIC NO DPR listed on EPIC   Patient Active Problem List   Diagnosis Date Noted   Acute CVA (cerebrovascular accident) (HCC)    Pressure injury of skin 02/20/2021   S/P MVR (mitral valve replacement) 02/17/2021   S/P CABG x 1 02/17/2021   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HCC)    Protein-calorie malnutrition, severe 02/03/2021   Endocarditis of mitral valve    Long term current use of anticoagulant therapy    Encounter for preoperative dental examination    Caries    Retained tooth root    Chronic apical periodontitis    Accretions on teeth    Teeth missing    Excessive dental attrition    Chronic periodontitis    Cardiogenic shock (HCC) 01/30/2021   Severe mitral regurgitation 01/29/2021   Serum total bilirubin elevated 01/29/2021   Paroxysmal VT 01/29/2021   Acute systolic CHF (congestive heart failure) (HCC) 01/28/2021   CAP (community acquired pneumonia) 01/27/2021   Pleural effusion on right 01/27/2021   Leukocytosis 01/27/2021   Microcytic anemia 01/27/2021   Hypoalbuminemia 01/27/2021   Elevated brain natriuretic peptide (BNP) level 01/27/2021   NSTEMI (non-ST elevated myocardial infarction) (HCC) 01/27/2021   DVT (deep venous thrombosis) (HCC) 01/27/2021   Diarrhea 01/27/2021   Cigarette smoker 01/27/2021   COPD (chronic obstructive  pulmonary disease) (HCC) 01/27/2021   Asthma 01/27/2021    Plan: [facility] RN CM scheduled this patient for another call attempt within 4-7 business days Unsuccessful outreach letter sent on 03/09/21 Unsuccessful outreach on 03/09/21   Tecumseh Yeagley L. Noelle Penner, RN, BSN, CCM [facility] Telephonic Care Management Care Coordinator Office number [phone] Mobile number [phone]  Main [facility] number [phone] Fax number [phone]

## 2021-03-09 NOTE — Telephone Encounter (Signed)
I called and spoke with Fleet Contras (home health), pt is admitted at [facility] for anti-coagulation management. I discussed that it would be best if pt could be discharged to SNF to complete his antibiotic course, since he is not a good home IV antibiotic candidate. She was concerned about pt's adherence to home IV antibiotics starting Monday, as he would not let his family help him with the antibiotics administration starting this week.

## 2021-03-09 NOTE — Telephone Encounter (Addendum)
Called pt since he had a supratherapeutic INR (greater than 10.0 per Home Health) on yesterday and was advised to go to the ER. Pt did not come to a [facility]: [facility] for treatment therefore, tried to call pt's number and it went to voicemail and it's not set up so unable to leave messages. Pt lives in [place] and has a choice of [facility] versus [facility]. According to note yesterday, Home Health nurse reported the pt was going to the hospital. Need to follow up to see if he did seek treatment or not.   At 240pm, called the pt again and there was no answer. Then called the alternate number and the lady states she is the neighbor and he uses this number when he is at home in [place]. She stated that [employer] dropped off a refrigerated package and she took it to his porch but he's not home. Advised I would try to assist with finding out how to stop that delivery since pt is not there and ha not been sleeping there since hospitalization.   Called the aunt's number and she reports the pt was in ICU yesterday and he is doing okay today. She states they are planning to move him out of ICU but there are no beds. Asked which facility and she stated he is in [facility]. She states he was discharged to her home in [place] after hospitalization from [place]. She states she told the Home Health Nurse that she could not take care of him cause he does not listen and is not following doctor's orders and they discussed him going to rehabilitation facility for care since his new surgery and meds. She states she really wants him to heal and get better but he needs around the clock monitoring. Also, she states it was fine to call back to get updates.   Called [facility] and explained and left message for Revonda Standard regarding the medication delivery to see if it could be stopped or sent to his Aunt's home. Will await a call back.   At 412pm, spoke with Bradd Canary and she  stated she appreciated the update on the med package and that she will update the company and stop shipment since he is hospitalized and they plan to send him to a SNF post [facility] discharge. She states she will call them and see what to do with the shipment since it will probably be discarded since premixed and been outside.

## 2021-03-10 ENCOUNTER — Telehealth: Payer: Self-pay | Admitting: *Deleted

## 2021-03-10 ENCOUNTER — Other Ambulatory Visit: Payer: Self-pay | Admitting: *Deleted

## 2021-03-10 ENCOUNTER — Other Ambulatory Visit (HOSPITAL_COMMUNITY): Payer: Self-pay | Admitting: Cardiology

## 2021-03-10 LAB — MISCELLANEOUS TEST

## 2021-03-10 MED ORDER — SACUBITRIL-VALSARTAN 24-26 MG PO TABS: 1.0000 | ORAL_TABLET | Freq: Two times a day (BID) | ORAL | 3 refills | Status: DC

## 2021-03-10 NOTE — Patient Outreach (Signed)
[facility]) Care Management Telephonic RN Care Manager Note   03/10/2021 Name:  Mark Garrett MRN:  [id number] DOB:  05-20-1980  Summary: Unsuccessful outreach, patient re admitted to [facility], not eligible for [facility] services, closed Mark Garrett was referred to [facility] for EMMI stroke follow up on 03/07/21 for a red flag on Friday 03/04/21 10 am for scheduled a follow up appointment? No after his discharged listed from [facility] per referral        Outreach attempt to the listed at the preferred outreach number in [facility] [phone] on 03/09/21 & 03/10/21  No answer. Automated message states patient does not have a voice mail box set up to allow RN CM to leave a message  Pt does not have an e-mail listed in epic nor my chart set up NO pcp listed on EPIC NO DPR listed on EPIC   Attempt to identify possible information from care everywhere unsuccessful   Outreach to the mobile number listed [phone] and his male neighbor answered and reported she had not seen patient since he went to the hospital. She gives permission for her number to remain listed for patient on epic.  Outreach to listed number for patient aunt, Rosey Bath  She confirms patient re admitted to [facility] hospital on 03/08/21   RN CM updated [facility] CMA of patient ineligibility for EMMI services at this time    Subjective: Mark Garrett is an 40 y.o. year old male who is a primary patient of Pcp, No. The care management team was consulted for assistance with care management and/or care coordination needs.    Telephonic RN Care Manager completed Telephone Visit today.   Objective:  Medications Reviewed Today     Reviewed by Lovie Chol, CRNA (Certified Registered Nurse Anesthetist) on 02/08/21 at 1500  Med List Status: Complete   Medication Order Taking? Sig Documenting Provider Last Dose Status Informant  XARELTO 20 MG TABS tablet [id number] Yes Take 20 mg by mouth every  evening. [provider] 01/25/2021 1900 Active Self           Med Note Margo Aye, MISTY D   Thu Jan 27, 2021  9:08 PM) Needs a refill             SDOH:  (Social Determinants of Health) assessments and interventions performed:    Care Plan  Review of patient past medical history, allergies, medications, health status, including review of consultants reports, laboratory and other test data, was performed as part of comprehensive evaluation for care management services.   There are no care plans that you recently modified to display for this patient.    Plan:  Pt is not eligible for [facility] EMMI services at this time He is hospitalized at [facility] per family. Case closed   Suni Jarnagin L. Noelle Penner, RN, BSN, CCM [facility] Telephonic Care Management Care Coordinator Office number [phone] Main [facility] number [phone] Fax number [phone]

## 2021-03-10 NOTE — Telephone Encounter (Signed)
Received a call from Diamantina Providence, Pharmacist at [facility] and she stated they were preparing the pt for discharge. She wanted to let us know that he had to receive a couple of doses of Vit K to get INR down from >10.0; she states today it is 3.6 and they will try to figure out a dose for discharge him on and where he will be going to be living.   Misty Stanley called back and stated she spoke with Hospitalist Dr Harrison Mons and they will have the pt come daily for IV antibiotics at the hospital so they will obtain an INR daily until Tuesday, 03/15/2021, and manage his warfarin dose daily. She asked after that if we would be able to manage long term and advised that we would and hopefully get in him our Anticoagulation Clinic in [place] on Tuesdays. She will update [place] on Tuesday with all the information and doses if nothing changes before the end of day. He will be receiving IV antibiotics until 03/30/2021. Also, she will give the number for direct order to Special Procedure Dept. She will call back if needed and has the direct number to reach the Anticoagulation Clinic. She is aware the pt was discharged from [facility] with a 3mg  tablet and per [facility] pt reported he has been taking and there are reports of ETOH use since discharge which may contributed to supratherapeutic INR.  Warfarin dose plan from [facility] is to give 2mg  today and start 1mg  daily which will be dependent on daily INR's they will obtain.    While in [facility] (11/10-12/13) Warfarin doses/INR: 12/2-2.5mg   1.4  12/3-2.5mg   1.5 12/4-2.5mg  1.5 12/5-2mg  2.2 12/6-2.5mg  2.0 12/7-2.5mg   2.2 12/8-5mg  2.1  12/9-3mg   3.0 12/10-NONE 3.8  12/11-NONE 3.6 12/12-2mg  2.9 12/13-d/c on 3mg  tabs but unsure if pt was taken per [facility] RN 2.7  Pt has a [place] MVR & goal of 2.5-3.5 per previous staff message.

## 2021-03-10 NOTE — Telephone Encounter (Signed)
Sent in application via fax.  Will follow up.  Of note, patient has no income. Will likely be denied initially and will require him to call [employer] once that occurs.

## 2021-03-11 NOTE — Telephone Encounter (Signed)
Advanced Heart Failure Patient Advocate Encounter  Received communication from [employer] that the patient needs to apply for and be denied Medicaid in order to get assistance. That is already in progress and we will use the hf fund when the patient needs a refill in the meantime. We can mail it from [facility] outpatient to him

## 2021-03-15 ENCOUNTER — Ambulatory Visit (INDEPENDENT_AMBULATORY_CARE_PROVIDER_SITE_OTHER): Payer: Self-pay

## 2021-03-15 DIAGNOSIS — I82403 Acute embolism and thrombosis of unspecified deep veins of lower extremity, bilateral: Secondary | ICD-10-CM

## 2021-03-15 DIAGNOSIS — I639 Cerebral infarction, unspecified: Secondary | ICD-10-CM

## 2021-03-15 DIAGNOSIS — Z952 Presence of prosthetic heart valve: Secondary | ICD-10-CM

## 2021-03-15 DIAGNOSIS — Z7901 Long term (current) use of anticoagulants: Secondary | ICD-10-CM

## 2021-03-15 LAB — PROTIME-INR: INR: 3.3 — AB (ref <=1.1)

## 2021-03-15 NOTE — Patient Instructions (Addendum)
Description   Spoke with Misty Stanley [phone], pharmacist at [facility] while pt was in infusion center, INR 3.3 today at hospital advised her to instruct pt to start taking 1.5mg  daily and recheck INR on Friday 03/18/21 while at hospital getting abx infusion.  Verbal orders given and also faxed written order to [facility] [phone]. Misty Stanley will get a good contact number for [place] to contact pt on in the future.  Pt is getting daily IV abx infusions at [facility] until 03/30/21, pt will be followed in [place] clinic due to currently residing there and transportation issues.

## 2021-03-16 ENCOUNTER — Telehealth: Payer: Self-pay | Admitting: *Deleted

## 2021-03-16 NOTE — Telephone Encounter (Signed)
Mark Garrett, pharmacist with [facility] called to report that she spoke with the pt yesterday and confirmed we had the correct telephone number to reach the pt, which is the same one on file. She states the pt can receive text and she is aware that we can't text out of office line. Therefore, once the results are called in to [place] from the pharmacist on Friday that the pt will call us directly at [phone]. She is aware we will need to speak with the pt and give instructions to the pt or the reporting pharmacist to ensure he follow those instructions until next INR check.   Orders were faxed over yesterday so we will await results on Friday.

## 2021-03-18 ENCOUNTER — Ambulatory Visit (INDEPENDENT_AMBULATORY_CARE_PROVIDER_SITE_OTHER): Payer: Self-pay | Admitting: Internal Medicine

## 2021-03-18 ENCOUNTER — Ambulatory Visit: Payer: Medicaid Other | Admitting: Internal Medicine

## 2021-03-18 ENCOUNTER — Telehealth: Payer: Self-pay

## 2021-03-18 DIAGNOSIS — Z5181 Encounter for therapeutic drug level monitoring: Secondary | ICD-10-CM

## 2021-03-18 LAB — PROTIME-INR: INR: 3.1 — AB (ref <=1.1)

## 2021-03-18 NOTE — Telephone Encounter (Signed)
Called patient to encourage him reschedule missed appt. Informed pt that since he is on antbx he will need to be seen by clinic to discuss plan of care. Patient was finally able to agree to virtual appt.  Juanita Laster, RMA

## 2021-03-18 NOTE — Telephone Encounter (Signed)
Called patient to see if he was coming in for his 8;45 appt today with ID. Patient states that he will not be coming in and would prefer not to reschedule. States that he does not want to travel to [place] to be seen. Would prefer to stay near [place].  Is being followed by [facility] for antibiotics and picc maintenance. Patient mentioned during call that he has not missed a dose of antbx. Did say his picc came out a little bit. Is scheduled to see [facility] today at 1. Juanita Laster, RMA

## 2021-03-18 NOTE — Patient Instructions (Addendum)
Description   Spoke with Aundra Millet [phone], pharmacist at [facility] and pt,  INR 3.1 today at hospital advised for pt to continue taking 1.5mg  daily and recheck INR on Tuesday 03/22/2021 while at hospital getting abx infusion.  Verbal orders given and also faxed written order to [facility] [phone]. Pt stated the best number to reach him is [phone]. Pt is getting daily IV abx infusions at [facility] until 03/30/21, pt will be followed in [place] clinic due to currently residing there and transportation issues.

## 2021-03-22 ENCOUNTER — Ambulatory Visit (INDEPENDENT_AMBULATORY_CARE_PROVIDER_SITE_OTHER): Payer: Self-pay

## 2021-03-22 DIAGNOSIS — I82403 Acute embolism and thrombosis of unspecified deep veins of lower extremity, bilateral: Secondary | ICD-10-CM

## 2021-03-22 DIAGNOSIS — Z7901 Long term (current) use of anticoagulants: Secondary | ICD-10-CM

## 2021-03-22 DIAGNOSIS — Z952 Presence of prosthetic heart valve: Secondary | ICD-10-CM

## 2021-03-22 DIAGNOSIS — I639 Cerebral infarction, unspecified: Secondary | ICD-10-CM

## 2021-03-22 LAB — PROTIME-INR: INR: 1.5 — AB (ref <=1.1)

## 2021-03-22 NOTE — Patient Instructions (Signed)
Description   Pharmacist called from [place] to report INR of 1.5. She states pt has already left, so she was unable to determine if he had missed any dosages.  Called spoke with pt, INR 1.5 today at hospital advised for pt to take an extra 1/2 tablet today, and start taking 1.5mg  daily except 3mg  on Tuesdays.  Recheck INR on Tuesday 03/29/2021 in [place] office since pt is no longer going to [facility] for IV abx infusions. Pt will be followed in [place] clinic due to currently residing there and transportation issues.  Pt stated the best number to reach him is [phone].

## 2021-03-23 ENCOUNTER — Other Ambulatory Visit: Payer: Self-pay | Admitting: Surgery

## 2021-03-23 DIAGNOSIS — Z951 Presence of aortocoronary bypass graft: Secondary | ICD-10-CM

## 2021-03-23 LAB — ACID FAST CULTURE WITH REFLEXED SENSITIVITIES (MYCOBACTERIA)
Acid Fast Culture: NEGATIVE
Acid Fast Culture: NEGATIVE
Source of Sample: 2
Source of Sample: 2

## 2021-03-29 ENCOUNTER — Other Ambulatory Visit: Payer: Self-pay

## 2021-03-29 ENCOUNTER — Ambulatory Visit (INDEPENDENT_AMBULATORY_CARE_PROVIDER_SITE_OTHER): Payer: Self-pay

## 2021-03-29 DIAGNOSIS — Z952 Presence of prosthetic heart valve: Secondary | ICD-10-CM

## 2021-03-29 DIAGNOSIS — Z7901 Long term (current) use of anticoagulants: Secondary | ICD-10-CM

## 2021-03-29 DIAGNOSIS — I4891 Unspecified atrial fibrillation: Secondary | ICD-10-CM

## 2021-03-29 DIAGNOSIS — I82403 Acute embolism and thrombosis of unspecified deep veins of lower extremity, bilateral: Secondary | ICD-10-CM

## 2021-03-29 DIAGNOSIS — I639 Cerebral infarction, unspecified: Secondary | ICD-10-CM

## 2021-03-29 DIAGNOSIS — Z5181 Encounter for therapeutic drug level monitoring: Secondary | ICD-10-CM

## 2021-03-29 LAB — POCT INR: INR: 2.8 (ref 2.0–3.0)

## 2021-03-29 NOTE — Patient Instructions (Signed)
Description   Continue taking 1.5mg  daily except 3mg  on Tuesdays. Recheck INR in 1 week at [facility] office.  Coumadin Clinic [phone] Pt stated the best number to reach him is [phone].

## 2021-03-30 ENCOUNTER — Encounter: Payer: Medicaid Other | Admitting: Surgery

## 2021-03-30 ENCOUNTER — Ambulatory Visit: Payer: Medicaid Other | Admitting: Internal Medicine

## 2021-03-31 ENCOUNTER — Other Ambulatory Visit (HOSPITAL_COMMUNITY): Payer: Medicaid Other

## 2021-03-31 ENCOUNTER — Encounter (HOSPITAL_COMMUNITY): Payer: Self-pay | Admitting: Cardiology

## 2021-04-04 ENCOUNTER — Other Ambulatory Visit: Payer: Self-pay

## 2021-04-04 ENCOUNTER — Ambulatory Visit (INDEPENDENT_AMBULATORY_CARE_PROVIDER_SITE_OTHER): Payer: Self-pay | Admitting: Internal Medicine

## 2021-04-04 ENCOUNTER — Telehealth: Payer: Self-pay

## 2021-04-04 DIAGNOSIS — I059 Rheumatic mitral valve disease, unspecified: Secondary | ICD-10-CM

## 2021-04-04 NOTE — Progress Notes (Signed)
Subjective:    Patient ID: Mark Garrett, male    DOB: 16-Feb-1981, 41 y.o.   MRN: [id number]  Chief Complaint  Patient presents with   Telehealth Consent     Virtual Visit via Telephone/Video Note   I connected with on 04/04/2021 at by telephone and verified that I am speaking with the correct person using two identifiers.   I discussed the limitations, risks, security and privacy concerns of performing an evaluation and management service by telephone and the availability of in person appointments. I also discussed with the patient that there may be a patient responsible charge related to this service. The patient expressed understanding and agreed to proceed.  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[place])    Protein-calorie malnutrition, severe 02/03/2021   Endocarditis of mitral valve    Long term current use of anticoagulant therapy    Encounter for preoperative dental examination    [place]    Retained tooth root    Chronic apical periodontitis    Accretions on teeth    Teeth missing    Excessive dental attrition    Chronic periodontitis    Cardiogenic shock ([place]) 01/30/2021   Severe mitral regurgitation 01/29/2021   Serum total bilirubin elevated 01/29/2021   Paroxysmal VT 16/60/6004   Acute systolic CHF (congestive heart failure) ([place]) 01/28/2021   CAP (community acquired pneumonia) 01/27/2021   Pleural effusion on right 01/27/2021   Leukocytosis 01/27/2021   Microcytic anemia 01/27/2021   Hypoalbuminemia 01/27/2021   Elevated brain natriuretic peptide (BNP) level 01/27/2021   NSTEMI (non-ST elevated myocardial infarction) ([place]) 01/27/2021   DVT (deep venous thrombosis) ([place]) 01/27/2021   Diarrhea  01/27/2021   Cigarette smoker 01/27/2021   COPD (chronic obstructive pulmonary disease) ([place]) 01/27/2021   Asthma 01/27/2021    Patient's Medications  New Prescriptions   No medications on file  Previous Medications   ASPIRIN 81 MG CHEWABLE TABLET    Chew 1 tablet (81 mg total) by mouth daily.   CEFTRIAXONE (ROCEPHIN) IVPB    Inject 2 g into the vein daily. Indication:  MV IE First Dose: Yes Last Day of Therapy:  03/30/21 Labs - Once weekly:  CBC/D and BMP, Labs - Every other week:  ESR and CRP Method of administration: IV Push Method of administration may be changed at the discretion of home infusion pharmacist based upon assessment of the patient and/or caregiver's ability to self-administer the medication ordered.   DAPAGLIFLOZIN PROPANEDIOL (FARXIGA) 10 MG TABS TABLET    Take 1 tablet (10 mg total) by mouth daily.   DAPTOMYCIN (CUBICIN) IVPB    Inject 650 mg into the vein daily. Indication:  MV IE First Dose: Yes Last Day of Therapy:  03/30/21 Labs - Once weekly:  CBC/D, BMP, and CPK Labs - Every other week:  ESR and CRP Method of administration: IV Push Method of administration may be changed at the discretion of home infusion pharmacist based upon assessment of the patient and/or caregiver's ability to self-administer the medication ordered.   DIGOXIN (LANOXIN) 0.125 MG TABLET    Take 1 tablet (0.125 mg total) by mouth daily.   FEEDING SUPPLEMENT (ENSURE ENLIVE / ENSURE PLUS) LIQD  Take 237 mLs by mouth 3 (three) times daily between meals.   METOPROLOL SUCCINATE (TOPROL-XL) 25 MG 24 HR TABLET    Take 0.5 tablets (12.5 mg total) by mouth daily.   MULTIPLE VITAMIN (MULTIVITAMIN WITH MINERALS) TABS TABLET    Take 1 tablet by mouth daily.   ROSUVASTATIN (CRESTOR) 40 MG TABLET    Take 1 tablet (40 mg total) by mouth daily.   SACUBITRIL-VALSARTAN (ENTRESTO) 24-26 MG    Take 1 tablet by mouth 2 (two) times daily.   SILDENAFIL (REVATIO) 20 MG TABLET    Take 2 tablets (40 mg total) by  mouth 3 (three) times daily.   SPIRONOLACTONE (ALDACTONE) 25 MG TABLET    Take 1 tablet (25 mg total) by mouth daily.   TRAMADOL (ULTRAM) 50 MG TABLET    Take 1 tablet (50 mg total) by mouth every 6 (six) hours as needed for moderate pain.   WARFARIN (COUMADIN) 3 MG TABLET    Take 1 tablet (3 mg total) by mouth daily at 4 PM. Or as directed  Modified Medications   No medications on file  Discontinued Medications   No medications on file  HPI: 41 YM with PMHx as below admitted form 11/10-12/13 to [facility] for pneumonia. Found to have right middle lobe and small bilateral lower lobe opacity concerning for pneumonia on CT. Started on azithormycin and ceftriaxone->Augmentin. B/l LE doppler showed shows age indeterminant DVT.  TEE revealed mobile vegetation about anterior and posterior mitral valve leaflets with severe mitral regurg. He underwent MVR on 12/1. Blood Cx were negative. [place] PCR ordered. 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   Interim: On 03/09/21 I called and spoke with Apolonio Schneiders (home health), pt is admitted at [facility] for anti-coagulation management. I discussed that it would be best if pt could be discharged to SNF to complete his antibiotic course, since he is not a good home IV antibiotic candidate. She was concerned about pt's adherence to home IV antibiotics starting Monday, as he would not let his family help him with the antibiotics administration starting this week. Following discharge form [facility] pt had been going to [facility] daily for IV antibiotics. Today: Pt reports that he stopped going to [facility] last week(mid-week). He has continued IV antibiotics at home via PICC line as he had leftover form initial home health management. Denies fever, chills, N,V,D.  Antibiotics:   Azithromycin 11/10 Ceftriaxone 11/10,11/28-p Augmentin 11/11-11/15 Cefepime 11/16-17, 20-11/27 Vancomycin  11/16-17, 20/11/24, Daptomycin 11/25-p   Review of Systems: ROS  Past Medical History:  Diagnosis Date   COPD (chronic obstructive pulmonary disease) (HCC)    DVT (deep venous thrombosis) (HCC)     Social History   Tobacco Use   Smoking status: Every Day    Packs/day: 0.50    Types: Cigarettes   Smokeless tobacco: Never  Vaping Use   Vaping Use: Never used  Substance Use Topics   Alcohol use: Yes    Comment: occ   Drug use: Yes    Types: Marijuana    Family History  Problem Relation Age of Onset   CAD Mother    CAD Father     Allergies  Allergen Reactions   Iodide Rash    Health Maintenance  Topic Date Due   COVID-19 Vaccine (1) Never done   Pneumococcal Vaccine 32-82 Years old (1 - PCV) Never done   Hepatitis C Screening  Never done  TETANUS/TDAP  Never done   INFLUENZA VACCINE  Never done   HIV Screening  Completed   HPV VACCINES  Aged Out      Lab Results Lab Results  Component Value Date   WBC 13.6 (H) 03/01/2021   HGB 9.9 (L) 03/01/2021   HCT 34.3 (L) 03/01/2021   MCV 82.7 03/01/2021   PLT 476 (H) 03/01/2021    Lab Results  Component Value Date   CREATININE 0.84 03/01/2021   BUN 18 03/01/2021   NA 135 03/01/2021   K 4.5 03/01/2021   CL 101 03/01/2021   CO2 25 03/01/2021    Lab Results  Component Value Date   ALT 29 02/01/2021   AST 27 02/01/2021   ALKPHOS 88 02/01/2021   BILITOT 2.9 (H) 02/01/2021    Lab Results  Component Value Date   CHOL 79 02/23/2021   HDL 25 (L) 02/23/2021   LDLCALC 38 02/23/2021   TRIG 80 02/23/2021   CHOLHDL 3.2 02/23/2021   Lab Results  Component Value Date   LABRPR NON REACTIVE 02/03/2021   HIV 1 RNA Quant (copies/mL)  Date Value  02/03/2021 <20     Problem List Items Addressed This Visit   None  # Culture negative native valve mitral valve vegetation SP MVR on 12/1 with negative Cx #Possible emboli to the lungs -Daptomycin and ceftriaxone EOT 03/30/21. [place] PCR with no growth.   -Pt starting going to [facility] around mid December for antibiotics. Home health stopped going due to home environment.  -He continued antibiotics after his last dose at [place] last week. The leftover he had form home health. I counseled tot to stop antibiotics as his course is complete. Discussed that his PICC line should come out as he is at risk of infection.   Recommendations: -Stop antibiotics -Remove PICC line(Counseled to call clinic when line is out) -Pt declined follow-up visit.    Laurice Record, MD [place] [facility] 04/04/2021, 11:35 AM

## 2021-04-04 NOTE — Telephone Encounter (Signed)
Received the following message from Jeri Modena with [facility]: Theron Arista.  I called [facility] and spoke with [employer]. [phone] Beth states [facility]/Dr. Luciana Axe will need to call the hospital [phone] and have yourself or Dr. Luciana Axe, ask to have Dr. Jodie Echevaria paged and let him know ID had cleared pt for PICC to be pulled.  Dr. Jodie Echevaria will then need to send the order to the [facility]fusion [facility] to have PICC pulled.  Hopefully it will be easier than it sounds as I type.  Dr. Jodie Echevaria saw this patient and knows him per [facility].   I spoke with Dr. Jodie Echevaria who was has agreed to sign off on the orders to have pt's picc line pulled. Will need to follow up with patient to ensure he has has picc pulled. Juanita Laster, RMA

## 2021-04-04 NOTE — Telephone Encounter (Signed)
[facility] to see if patient had picc line pulled today. Spoke with [employer] manager who states no orders are in for patient picc to be pulled. Not able to take an order from our provider since they do not have privileges with [facility]. Patient last seen at [facility] on 03/28/21. Reached out to Jeri Modena with [facility] to see if Home Health can setup a one time visit to pull picc. Waiting on response from [facility]. If [facility] is not able to pull picc patient will either need to come to office for RN to pull picc or will need to go to ER.  Attempted to call patient with this information, but not able to reach him at this time. Voicemail is not set up.  Juanita Laster, RMA

## 2021-04-05 ENCOUNTER — Telehealth: Payer: Self-pay

## 2021-04-05 NOTE — Telephone Encounter (Signed)
Spoke with Mark Garrett today who states she has not received an order to pull patient's picc. Informed her that I spoke with Dr. Jodie Echevaria who was going to send an order on 1/16. Beth requested order be faxed to her ans she would have MD sign.  Order faxed and confirmation received.  F: [phone] Juanita Laster, RMA

## 2021-04-05 NOTE — Telephone Encounter (Signed)
Called and spoke with pt concerning missed appt that was scheduled today for INR check.. Pt stated since it was raining, he could not come to appt. Appt r/s for 04/12/21. Pt verbalized understanding.

## 2021-04-06 NOTE — Telephone Encounter (Signed)
Beth with [facility] [phone]) called back with an appointment for the patient. Beth stated she has been unable to get in contact with he patient due to their number being blocked.   I reached out to the patient and was able to get in contact with him and relay the appointment information to him. Patient is scheduled for 04/07/21 at 9:00 am to have picc removed. Patient accepted appointment and verbalized understanding.  Sendy Pluta T Pricilla Loveless

## 2021-04-12 ENCOUNTER — Telehealth: Payer: Self-pay

## 2021-04-12 NOTE — Telephone Encounter (Signed)
Pt missed today's anticoagulation appt as well last weeks anticoagulation appt. Attempted to call pt; however, phone number has "calling restrictions" and unable to leave voicemail. Attempted to call pt's aunt, Helene Kelp, no answer. Left message to call back and reschedule appt.

## 2021-04-13 LAB — ACID FAST CULTURE WITH REFLEXED SENSITIVITIES (MYCOBACTERIA): Acid Fast Culture: NEGATIVE

## 2021-04-19 ENCOUNTER — Telehealth: Payer: Self-pay

## 2021-04-19 MED ORDER — WARFARIN SODIUM 3 MG PO TABS: 3.0000 mg | ORAL_TABLET | Freq: Every day | ORAL | 0 refills | Status: DC

## 2021-04-19 NOTE — Telephone Encounter (Signed)
Called and spoke with pt due to overdue anticoagulation visit. Pt stated he will run out of Warfarin today; however, he stated he has not missed any doses. I explained that I can send in a refill of Warfarin to last until next week and scheduled an appt to have INR checked on 04/26/21. Pt verbalized understanding. Appropriate dose and refill sent to requested pharmacy.

## 2021-04-26 ENCOUNTER — Other Ambulatory Visit: Payer: Self-pay

## 2021-04-26 ENCOUNTER — Other Ambulatory Visit: Payer: Self-pay | Admitting: Cardiology

## 2021-04-26 ENCOUNTER — Ambulatory Visit (INDEPENDENT_AMBULATORY_CARE_PROVIDER_SITE_OTHER): Payer: Self-pay

## 2021-04-26 DIAGNOSIS — I4891 Unspecified atrial fibrillation: Secondary | ICD-10-CM

## 2021-04-26 DIAGNOSIS — Z5181 Encounter for therapeutic drug level monitoring: Secondary | ICD-10-CM

## 2021-04-26 LAB — POCT INR: INR: 1.1 — AB (ref 2.0–3.0)

## 2021-04-26 NOTE — Patient Instructions (Addendum)
Description   Take 2 tablets (6mg )  today and 1 tablet tomorrow (3mg ) and then resume taking 1.5mg  daily except 3mg  on Tuesdays. Recheck INR in 1 week at [facility] office.  Coumadin Clinic [phone]  Pt stated the best number to reach him is [phone].

## 2021-04-26 NOTE — Telephone Encounter (Signed)
Message sent to scheduling to schedule an  appt with Cardiology.

## 2021-04-26 NOTE — Telephone Encounter (Signed)
Prescription refill request received for warfarin Lov: Needs to schedule appt with Cardiology  Next INR check: 05/03/21 Warfarin tablet strength: 3mg   Appropriate dose and refill sent to requested pharmacy.

## 2021-04-26 NOTE — Telephone Encounter (Signed)
Prescription refill request received for warfarin Lov: Needs to schedule an appointment Next INR check: Pt is scheduled to come in today 2/7 Warfarin tablet strength: 3mg 

## 2021-05-03 ENCOUNTER — Telehealth: Payer: Self-pay

## 2021-05-03 NOTE — Telephone Encounter (Signed)
Called pt and reminded him of Anticoagulation appt today. Pt stated he will not be able to come to appt today. I asked pt if he had been taking his Warfarin as prescribed last week at his appt. Pt stated he never went to [facility] to pick up the Warfarin and has not taken any medication. I educated pt on th importance of taking medication and adhering to dosage instructions/anticoagulation appts. I asked what was preventing him from taking his medications and coming ot appt and pt stated he was "broke". I rescheduled anticoagulation appt for 05/10/21 and stressed the importance of picking up Warfarin from pharmacy and adhering to his medication regimen. Pt verbalized understanding

## 2021-05-10 ENCOUNTER — Telehealth: Payer: Self-pay

## 2021-05-10 NOTE — Telephone Encounter (Signed)
Pt missed scheduled anticoagulation visit. Called pt to reschedule and determine if pt has been taking his Warfarin. Pt stated he has not been taking ANY of his medications. I explained to him the risk of not taking Warfarin. I also ask if he would like me to reach out to a social worker to possibly assist him with resources to help him with is medications. Pt stated he already has an appt with a Education officer, museum; however, he thinks he missed that appt today as well. I offered to reschedule his anticoagulation visit; however, pt declined. Will continue to follow pt a this time.

## 2021-05-13 ENCOUNTER — Telehealth (HOSPITAL_COMMUNITY): Payer: Self-pay | Admitting: Licensed Clinical Social Worker

## 2021-05-13 NOTE — Progress Notes (Signed)
Heart and Vascular Care Navigation  05/13/2021  Mark Garrett [facility] November 26, 1980 [id number]  Reason for Referral: Pt referred for missing appts and being unable to afford medications leading to noncompliance.   Engaged with patient by telephone for follow up visit for Heart and Vascular Care Coordination.                                                                                                   Assessment:   CSW contacted pt to discuss above concerns.  Pt reports he has no money so can't afford medications and has no form of transportation so can't make it to appts.  CSW spoke with clinic staff and had pt scheduled for appt next week- was able to arrange transportation for pt through [facility] to get to appt.   Pt also states that he is still at his Aunts house (moved in temporarily following surgery) but that he will he to leave in 2 weeks- does not have a plan of where to go at this time- is considering going to shelter.                                      HRT/VAS Care Coordination     Patients Home Cardiology Office Heart Failure Clinic   Outpatient Care Team Social Worker   Social Worker Name: [facility] [phone]   Living arrangements for the past 2 months Single Family Home   Lives with: Self   Patient Current Insurance Coverage Self-Pay   Patient Has Concern With Paying Medical Bills Yes   Patient Concerns With Medical Bills no insurance   Medical Bill Referrals: being assessed for Medicaid   Does Patient Have Prescription Coverage? No   Home Assistive Devices/Equipment Prosthesis   DME Agency NA       Social History:                                                                             SDOH Screenings   Alcohol Screen: Not on file  Depression (PHQ2-9): Low Risk    PHQ-2 Score: 0  Financial Resource Strain: High Risk   Difficulty of Paying Living Expenses: Hard  Food Insecurity: No Food Insecurity   Worried About Brewing technologist in the Last Year: Never true   Ran Out of Food in the Last Year: Never true  Housing: High Risk   Last Housing Risk Score: 2  Physical Activity: Not on file  Social Connections: Not on file  Stress: Not on file  Tobacco Use: High Risk   Smoking Tobacco Use: Every Day   Smokeless Tobacco Use: Never   Passive Exposure: Not on file  Transportation Needs: Unmet  Regulatory affairs officer (Medical): Yes   Lack of Transportation (Non-Medical): Yes    SDOH Interventions: Financial Resources:  Financial Strain Interventions: Other (Comment) (will need to be connected to heart failure fund) [facility] but unsure of current status  Food Insecurity:   Per past notes receives food stamps  Housing Insecurity:   Will be losing housing in 2 weeks- currently staying with aunt  Transportation:   Transportation Interventions: Retail banker   Follow-up plan:    CSW to meet with pt next week to assist with above concerns.  Will continue to follow and assist as needed  Burna Sis, LCSW Clinical Social Worker [facility] Desk#: [phone] Cell#: [phone]
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
## 2021-05-18 ENCOUNTER — Encounter (HOSPITAL_COMMUNITY): Payer: Medicaid Other

## 2021-06-07 ENCOUNTER — Telehealth: Payer: Self-pay

## 2021-06-07 NOTE — Telephone Encounter (Signed)
Pt has not been seen in anticoagulation clinic since 04/26/21. Attempted to call pt, no answer and unable to leave voicemail. Per LCSW note on 05/13/21, [facility] is aware that pt is not taking any of his medication and has no transportation or money to afford medications. Will continue to follow and try to get in touch with pt to encourage medication compliance and schedule anticoagulation appt.  ?

## 2021-06-09 ENCOUNTER — Telehealth (HOSPITAL_COMMUNITY): Payer: Self-pay | Admitting: Licensed Clinical Social Worker

## 2021-06-09 ENCOUNTER — Other Ambulatory Visit (HOSPITAL_COMMUNITY): Payer: Self-pay

## 2021-06-09 MED ORDER — METOPROLOL SUCCINATE ER 25 MG PO TB24
12.5000 mg | ORAL_TABLET | Freq: Every day | ORAL | 1 refills | Status: DC
  Filled 2021-06-09: qty 15, 30d supply, fill #0

## 2021-06-09 MED ORDER — DAPAGLIFLOZIN PROPANEDIOL 10 MG PO TABS
10.0000 mg | ORAL_TABLET | Freq: Every day | ORAL | 1 refills | Status: DC
  Filled 2021-06-09: qty 30, 30d supply, fill #0

## 2021-06-09 MED ORDER — DIGOXIN 125 MCG PO TABS
0.1250 mg | ORAL_TABLET | Freq: Every day | ORAL | 1 refills | Status: DC
  Filled 2021-06-09: qty 30, 30d supply, fill #0

## 2021-06-09 MED ORDER — ASPIRIN 81 MG PO CHEW: 81.0000 mg | CHEWABLE_TABLET | Freq: Every day | ORAL | Status: DC

## 2021-06-09 MED ORDER — METOPROLOL SUCCINATE ER 25 MG PO TB24
12.5000 mg | ORAL_TABLET | Freq: Every day | ORAL | 1 refills | Status: DC
  Filled 2021-06-09: qty 30, 60d supply, fill #0

## 2021-06-09 MED ORDER — ROSUVASTATIN CALCIUM 40 MG PO TABS
40.0000 mg | ORAL_TABLET | Freq: Every day | ORAL | 1 refills | Status: DC
  Filled 2021-06-09: qty 30, 30d supply, fill #0

## 2021-06-09 MED ORDER — WARFARIN SODIUM 3 MG PO TABS
ORAL_TABLET | ORAL | 0 refills | Status: DC
  Filled 2021-06-09: qty 10, 10d supply, fill #0

## 2021-06-09 MED ORDER — SPIRONOLACTONE 25 MG PO TABS
25.0000 mg | ORAL_TABLET | Freq: Every day | ORAL | 1 refills | Status: DC
  Filled 2021-06-09: qty 30, 30d supply, fill #0

## 2021-06-09 MED ORDER — WARFARIN SODIUM 3 MG PO TABS
ORAL_TABLET | ORAL | 0 refills | Status: DC
  Filled 2021-06-09: qty 10, fill #0

## 2021-06-09 NOTE — Telephone Encounter (Signed)
Refills sent to out patient pharmacy under HF fund. ? ?Medication Samples have been provided to the patient. ? ?Drug name: entresto       Strength: 24/26        Qty: 2 bottles  LOT: [id number]  Exp.Date: 04/25 ? ?Dosing instructions: take 1 tablet Twice daily ? ? ?The patient has been instructed regarding the correct time, dose, and frequency of taking this medication, including desired effects and most common side effects.  ? ?Mark Garrett Mark Garrett ?12:25 PM ?06/09/2021 ? ?
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
## 2021-06-21 ENCOUNTER — Other Ambulatory Visit (HOSPITAL_COMMUNITY): Payer: Self-pay

## 2021-06-21 ENCOUNTER — Ambulatory Visit (HOSPITAL_COMMUNITY)
Admission: RE | Admit: 2021-06-21 | Discharge: 2021-06-21 | Disposition: A | Discharge status: Home / Self Care | Payer: Self-pay | Source: Ambulatory Visit | Attending: Cardiology | Admitting: Cardiology

## 2021-06-21 ENCOUNTER — Encounter (HOSPITAL_COMMUNITY): Payer: Self-pay | Admitting: Cardiology

## 2021-06-21 ENCOUNTER — Ambulatory Visit (INDEPENDENT_AMBULATORY_CARE_PROVIDER_SITE_OTHER): Payer: Self-pay | Admitting: Pharmacist

## 2021-06-21 VITALS — BP 150/90 | HR 64 | Wt 182.8 lb

## 2021-06-21 DIAGNOSIS — I2510 Atherosclerotic heart disease of native coronary artery without angina pectoris: Secondary | ICD-10-CM | POA: Insufficient documentation

## 2021-06-21 DIAGNOSIS — Z7901 Long term (current) use of anticoagulants: Secondary | ICD-10-CM | POA: Insufficient documentation

## 2021-06-21 DIAGNOSIS — I5021 Acute systolic (congestive) heart failure: Secondary | ICD-10-CM

## 2021-06-21 DIAGNOSIS — I38 Endocarditis, valve unspecified: Secondary | ICD-10-CM | POA: Insufficient documentation

## 2021-06-21 DIAGNOSIS — Z952 Presence of prosthetic heart valve: Secondary | ICD-10-CM

## 2021-06-21 DIAGNOSIS — Z79899 Other long term (current) drug therapy: Secondary | ICD-10-CM | POA: Insufficient documentation

## 2021-06-21 DIAGNOSIS — Z87891 Personal history of nicotine dependence: Secondary | ICD-10-CM | POA: Insufficient documentation

## 2021-06-21 DIAGNOSIS — I82403 Acute embolism and thrombosis of unspecified deep veins of lower extremity, bilateral: Secondary | ICD-10-CM

## 2021-06-21 DIAGNOSIS — Z8673 Personal history of transient ischemic attack (TIA), and cerebral infarction without residual deficits: Secondary | ICD-10-CM | POA: Insufficient documentation

## 2021-06-21 DIAGNOSIS — E785 Hyperlipidemia, unspecified: Secondary | ICD-10-CM | POA: Insufficient documentation

## 2021-06-21 DIAGNOSIS — Z86718 Personal history of other venous thrombosis and embolism: Secondary | ICD-10-CM | POA: Insufficient documentation

## 2021-06-21 DIAGNOSIS — K029 Dental caries, unspecified: Secondary | ICD-10-CM

## 2021-06-21 DIAGNOSIS — I639 Cerebral infarction, unspecified: Secondary | ICD-10-CM

## 2021-06-21 DIAGNOSIS — I059 Rheumatic mitral valve disease, unspecified: Secondary | ICD-10-CM | POA: Insufficient documentation

## 2021-06-21 DIAGNOSIS — I739 Peripheral vascular disease, unspecified: Secondary | ICD-10-CM | POA: Insufficient documentation

## 2021-06-21 DIAGNOSIS — I5022 Chronic systolic (congestive) heart failure: Secondary | ICD-10-CM | POA: Insufficient documentation

## 2021-06-21 DIAGNOSIS — I255 Ischemic cardiomyopathy: Secondary | ICD-10-CM | POA: Insufficient documentation

## 2021-06-21 DIAGNOSIS — Z951 Presence of aortocoronary bypass graft: Secondary | ICD-10-CM | POA: Insufficient documentation

## 2021-06-21 LAB — COMPREHENSIVE METABOLIC PANEL
ALT: 18 U/L (ref 0–44)
AST: 21 U/L (ref 15–41)
Albumin: 4.4 g/dL (ref 3.5–5.0)
Alkaline Phosphatase: 93 U/L (ref 38–126)
Anion gap: 7 (ref 5–15)
BUN: 12 mg/dL (ref 6–20)
CO2: 21 mmol/L — ABNORMAL LOW (ref 22–32)
Calcium: 9.8 mg/dL (ref 8.9–10.3)
Chloride: 109 mmol/L (ref 98–111)
Creatinine, Ser: 0.9 mg/dL (ref 0.61–1.24)
GFR, Estimated: >60 mL/min (ref >60)
Glucose, Bld: 90 mg/dL (ref 70–99)
Potassium: 4.5 mmol/L (ref 3.5–5.1)
Sodium: 137 mmol/L (ref 135–145)
Total Bilirubin: 1 mg/dL (ref 0.3–1.2)
Total Protein: 7.9 g/dL (ref 6.5–8.1)

## 2021-06-21 LAB — LIPID PANEL
Cholesterol: 111 mg/dL (ref 0–200)
HDL: 38 mg/dL — ABNORMAL LOW (ref >40)
LDL Cholesterol: 64 mg/dL (ref 0–99)
Total CHOL/HDL Ratio: 2.9 RATIO
Triglycerides: 47 mg/dL (ref <150)
VLDL: 9 mg/dL (ref 0–40)

## 2021-06-21 LAB — PROTIME-INR
INR: 1.3 — ABNORMAL HIGH (ref 0.8–1.2)
Prothrombin Time: 16.4 seconds — ABNORMAL HIGH (ref 11.4–15.2)

## 2021-06-21 LAB — DIGOXIN LEVEL: Digoxin Level: 0.7 ng/mL — ABNORMAL LOW (ref 0.8–2.0)

## 2021-06-21 MED ORDER — SILDENAFIL CITRATE 20 MG PO TABS: 40.0000 mg | ORAL_TABLET | Freq: Three times a day (TID) | ORAL | 3 refills | Status: DC

## 2021-06-21 MED ORDER — ENTRESTO 49-51 MG PO TABS: 1.0000 | ORAL_TABLET | Freq: Two times a day (BID) | ORAL | Status: DC

## 2021-06-21 NOTE — Patient Instructions (Signed)
Medication Changes: ? ?Increase Entresto to 49/51 Twice daily ? ? ?Lab Work: ? ?Labs done today, your results will be available in MyChart, we will contact you for abnormal readings. ? ? ?Testing/Procedures: ? ?Your physician has requested that you have an echocardiogram. Echocardiography is a painless test that uses sound waves to create images of your heart. It provides your doctor with information about the size and shape of your heart and how well your heart?s chambers and valves are working. This procedure takes approximately one hour. There are no restrictions for this procedure. ? ?Repeat blood work in 10 days ? ?Referrals: ? ?Please follow up with our heart failure pharmacist in 4 weeks ? ?You have been referred to Dr. Cyndia Bent who preformed your surgery for a follow up. ? ? ?Special Instructions // Education: ? ?You need to provide proof of income so that we can get you assistances for your Entresto. If you are unable to do that please call the company on [phone] to get an income attestation letter sent for you to fill in and return to them. ? ? No more samples will be given after today ? ?Follow-Up in: 1 month with echocardiogram  ? ?At the [facility], you and your health needs are our priority. We have a designated team specialized in the treatment of Heart Failure. This Care Team includes your primary Heart Failure Specialized Cardiologist (physician), Advanced Practice Providers (APPs- Physician Assistants and Nurse Practitioners), and Pharmacist who all work together to provide you with the care you need, when you need it.  ? ?You may see any of the following providers on your designated Care Team at your next follow up: ? ?Dr Glori Bickers ?Dr Loralie Champagne ?Darrick Grinder, NP ?Lyda Jester, PA ?Jessica Milford,NP ?Marlyce Huge, PA ?Audry Riles, PharmD ? ? ?Please be sure to bring in all your medications bottles to every appointment.  ? ?Need to Contact us: ? ?If you have any  questions or concerns before your next appointment please send [place] a message through [place] or call our office at [phone].   ? ?TO LEAVE A MESSAGE FOR THE NURSE SELECT OPTION 2, PLEASE LEAVE A MESSAGE INCLUDING: ?YOUR NAME ?DATE OF BIRTH ?CALL BACK NUMBER ?REASON FOR CALL**this is important as we prioritize the call backs ? ?YOU WILL RECEIVE A CALL BACK THE SAME DAY AS LONG AS YOU CALL BEFORE 4:00 PM ? ? ?

## 2021-06-21 NOTE — Patient Instructions (Addendum)
Description   ?Instructed patient to take 3mg  today, tomorrow, and Thursday and then continue 1.5mg  daily except 3mg  on Tuesdays. Recheck INR in 1 week at [facility] office.  ?Coumadin Clinic [phone] ? ?Pt stated the best number to reach him is [phone].  ?  ? ? ?

## 2021-06-21 NOTE — Progress Notes (Signed)
Medication Samples have been provided to the patient. ? ?Drug name: Entresto       Strength: 49/51 mg        Qty: 2  LOT: [id number]  Exp.Date: 03/2023 ? ?Dosing instructions: Take 1 tablet Twice daily ? ? ?The patient has been instructed regarding the correct time, dose, and frequency of taking this medication, including desired effects and most common side effects.  ? ?Mark Garrett ?3:45 PM ?06/21/2021 ? ?
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
## 2021-06-28 ENCOUNTER — Telehealth: Payer: Self-pay

## 2021-06-28 NOTE — Telephone Encounter (Signed)
Pt missed anticoagulation appt. Called, no answer. Unable to leave voicemail.  
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
## 2021-07-14 ENCOUNTER — Inpatient Hospital Stay (HOSPITAL_COMMUNITY): Admission: RE | Admit: 2021-07-14 | Payer: Medicaid Other | Source: Ambulatory Visit

## 2021-07-14 ENCOUNTER — Other Ambulatory Visit (HOSPITAL_COMMUNITY): Payer: Self-pay

## 2021-07-27 ENCOUNTER — Ambulatory Visit: Payer: Self-pay | Admitting: Surgery

## 2021-07-28 ENCOUNTER — Encounter: Payer: Self-pay | Admitting: Surgery

## 2021-08-02 ENCOUNTER — Encounter (HOSPITAL_COMMUNITY): Payer: Self-pay | Admitting: Cardiology

## 2021-08-02 ENCOUNTER — Other Ambulatory Visit (HOSPITAL_COMMUNITY): Payer: Self-pay

## 2021-08-02 ENCOUNTER — Ambulatory Visit (HOSPITAL_BASED_OUTPATIENT_CLINIC_OR_DEPARTMENT_OTHER)
Admission: RE | Admit: 2021-08-02 | Discharge: 2021-08-02 | Disposition: A | Discharge status: Home / Self Care | Payer: Self-pay | Source: Ambulatory Visit | Attending: Cardiology | Admitting: Cardiology

## 2021-08-02 ENCOUNTER — Ambulatory Visit (HOSPITAL_COMMUNITY)
Admission: RE | Admit: 2021-08-02 | Discharge: 2021-08-02 | Disposition: A | Discharge status: Home / Self Care | Payer: Self-pay | Source: Ambulatory Visit | Attending: Cardiology | Admitting: Cardiology

## 2021-08-02 ENCOUNTER — Telehealth: Payer: Self-pay

## 2021-08-02 VITALS — BP 160/100 | HR 85 | Wt 185.8 lb

## 2021-08-02 DIAGNOSIS — I739 Peripheral vascular disease, unspecified: Secondary | ICD-10-CM | POA: Insufficient documentation

## 2021-08-02 DIAGNOSIS — I340 Nonrheumatic mitral (valve) insufficiency: Secondary | ICD-10-CM | POA: Insufficient documentation

## 2021-08-02 DIAGNOSIS — J449 Chronic obstructive pulmonary disease, unspecified: Secondary | ICD-10-CM | POA: Insufficient documentation

## 2021-08-02 DIAGNOSIS — Z716 Tobacco abuse counseling: Secondary | ICD-10-CM | POA: Insufficient documentation

## 2021-08-02 DIAGNOSIS — I708 Atherosclerosis of other arteries: Secondary | ICD-10-CM | POA: Insufficient documentation

## 2021-08-02 DIAGNOSIS — I5022 Chronic systolic (congestive) heart failure: Secondary | ICD-10-CM

## 2021-08-02 DIAGNOSIS — Z89432 Acquired absence of left foot: Secondary | ICD-10-CM | POA: Insufficient documentation

## 2021-08-02 DIAGNOSIS — Z8673 Personal history of transient ischemic attack (TIA), and cerebral infarction without residual deficits: Secondary | ICD-10-CM | POA: Insufficient documentation

## 2021-08-02 DIAGNOSIS — Z952 Presence of prosthetic heart valve: Secondary | ICD-10-CM | POA: Insufficient documentation

## 2021-08-02 DIAGNOSIS — Z951 Presence of aortocoronary bypass graft: Secondary | ICD-10-CM | POA: Insufficient documentation

## 2021-08-02 DIAGNOSIS — Z9714 Presence of artificial left leg (complete) (partial): Secondary | ICD-10-CM | POA: Insufficient documentation

## 2021-08-02 DIAGNOSIS — I255 Ischemic cardiomyopathy: Secondary | ICD-10-CM | POA: Insufficient documentation

## 2021-08-02 DIAGNOSIS — I509 Heart failure, unspecified: Secondary | ICD-10-CM

## 2021-08-02 DIAGNOSIS — F17210 Nicotine dependence, cigarettes, uncomplicated: Secondary | ICD-10-CM | POA: Insufficient documentation

## 2021-08-02 DIAGNOSIS — I2510 Atherosclerotic heart disease of native coronary artery without angina pectoris: Secondary | ICD-10-CM | POA: Insufficient documentation

## 2021-08-02 DIAGNOSIS — I5021 Acute systolic (congestive) heart failure: Secondary | ICD-10-CM

## 2021-08-02 DIAGNOSIS — Z79899 Other long term (current) drug therapy: Secondary | ICD-10-CM | POA: Insufficient documentation

## 2021-08-02 DIAGNOSIS — R011 Cardiac murmur, unspecified: Secondary | ICD-10-CM | POA: Insufficient documentation

## 2021-08-02 DIAGNOSIS — Z7901 Long term (current) use of anticoagulants: Secondary | ICD-10-CM | POA: Insufficient documentation

## 2021-08-02 DIAGNOSIS — I38 Endocarditis, valve unspecified: Secondary | ICD-10-CM | POA: Insufficient documentation

## 2021-08-02 DIAGNOSIS — Z86718 Personal history of other venous thrombosis and embolism: Secondary | ICD-10-CM | POA: Insufficient documentation

## 2021-08-02 LAB — ECHOCARDIOGRAM COMPLETE
AR max vel: 1.66 cm2
AV Area VTI: 1.62 cm2
AV Area mean vel: 1.69 cm2
AV Mean grad: 6 mmHg
AV Peak grad: 12.1 mmHg
Ao pk vel: 1.74 m/s
Area-P 1/2: 3.48 cm2
Calc EF: 13.4 %
MV VTI: 0.66 cm2
S' Lateral: 5.3 cm
Single Plane A2C EF: -0.6 %
Single Plane A4C EF: 25.5 %

## 2021-08-02 LAB — CBC
HCT: 50.5 % (ref 39.0–52.0)
Hemoglobin: 16.3 g/dL (ref 13.0–17.0)
MCH: 27 pg (ref 26.0–34.0)
MCHC: 32.3 g/dL (ref 30.0–36.0)
MCV: 83.6 fL (ref 80.0–100.0)
Platelets: 197 10*3/uL (ref 150–400)
RBC: 6.04 MIL/uL — ABNORMAL HIGH (ref 4.22–5.81)
RDW: 18.6 % — ABNORMAL HIGH (ref 11.5–15.5)
WBC: 9.3 10*3/uL (ref 4.0–10.5)
nRBC: 0 % (ref 0.0–0.2)

## 2021-08-02 LAB — PROTIME-INR
INR: 1.1 (ref 0.8–1.2)
Prothrombin Time: 13.8 seconds (ref 11.4–15.2)

## 2021-08-02 LAB — BASIC METABOLIC PANEL
Anion gap: 9 (ref 5–15)
BUN: 10 mg/dL (ref 6–20)
CO2: 23 mmol/L (ref 22–32)
Calcium: 9.3 mg/dL (ref 8.9–10.3)
Chloride: 107 mmol/L (ref 98–111)
Creatinine, Ser: 1.17 mg/dL (ref 0.61–1.24)
GFR, Estimated: >60 mL/min (ref >60)
Glucose, Bld: 102 mg/dL — ABNORMAL HIGH (ref 70–99)
Potassium: 4.3 mmol/L (ref 3.5–5.1)
Sodium: 139 mmol/L (ref 135–145)

## 2021-08-02 LAB — DIGOXIN LEVEL: Digoxin Level: <0.2 ng/mL — ABNORMAL LOW (ref 0.8–2.0)

## 2021-08-02 MED ORDER — WARFARIN SODIUM 2 MG PO TABS
ORAL_TABLET | ORAL | 6 refills | Status: DC
  Filled 2021-08-02: qty 30, 30d supply, fill #0

## 2021-08-02 MED ORDER — WARFARIN SODIUM 3 MG PO TABS
ORAL_TABLET | ORAL | 0 refills | Status: DC
  Filled 2021-08-02: qty 10, 10d supply, fill #0

## 2021-08-02 MED ORDER — ASPIRIN 81 MG PO CHEW
81.0000 mg | CHEWABLE_TABLET | Freq: Every day | ORAL | Status: DC
Start: 2021-08-02 — End: 2021-08-05

## 2021-08-02 MED ORDER — SILDENAFIL CITRATE 20 MG PO TABS
40.0000 mg | ORAL_TABLET | Freq: Three times a day (TID) | ORAL | 1 refills | Status: DC
  Filled 2021-08-02 (×3): qty 180, 30d supply, fill #0
  Filled 2021-09-14: qty 180, 30d supply, fill #1

## 2021-08-02 MED ORDER — ROSUVASTATIN CALCIUM 40 MG PO TABS
40.0000 mg | ORAL_TABLET | Freq: Every day | ORAL | 1 refills | Status: DC
  Filled 2021-08-02 (×2): qty 30, 30d supply, fill #0
  Filled 2021-09-14: qty 30, 30d supply, fill #1

## 2021-08-02 MED ORDER — DAPAGLIFLOZIN PROPANEDIOL 10 MG PO TABS
10.0000 mg | ORAL_TABLET | Freq: Every day | ORAL | 1 refills | Status: DC
  Filled 2021-08-02 (×2): qty 30, 30d supply, fill #0
  Filled 2021-09-14: qty 30, 30d supply, fill #1

## 2021-08-02 MED ORDER — METOPROLOL SUCCINATE ER 25 MG PO TB24
12.5000 mg | ORAL_TABLET | Freq: Every day | ORAL | 1 refills | Status: DC
  Filled 2021-08-02: qty 15, 30d supply, fill #0
  Filled 2021-08-02: qty 30, 60d supply, fill #0
  Filled 2021-09-14: qty 30, 60d supply, fill #1

## 2021-08-02 MED ORDER — SPIRONOLACTONE 25 MG PO TABS
25.0000 mg | ORAL_TABLET | Freq: Every day | ORAL | 1 refills | Status: DC
Start: 2021-08-02 — End: 2022-02-02
  Filled 2021-08-02 (×2): qty 30, 30d supply, fill #0
  Filled 2021-09-14: qty 30, 30d supply, fill #1

## 2021-08-02 MED ORDER — DIGOXIN 125 MCG PO TABS
0.1250 mg | ORAL_TABLET | Freq: Every day | ORAL | 1 refills | Status: DC
Start: 2021-08-02 — End: 2022-02-02
  Filled 2021-08-02 (×2): qty 30, 30d supply, fill #0
  Filled 2021-09-14: qty 30, 30d supply, fill #1

## 2021-08-02 NOTE — Telephone Encounter (Signed)
Overdue for anticoagulation appt. Called and spoke with pt. Pt stated he did not take Warfarin last week but he picked up his medications from the pharmacy today and is going to take his Warfarin as prescribed. Scheduled Anticoagulation appt for next Tuesday, 08/09/21 at [facility] office. Pt verbalized understanding.  ?
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
## 2021-08-02 NOTE — Progress Notes (Signed)
Paramedicine Initial Assessment: ? ?Housing:  ?In what kind of housing do you live? House/apt/trailer/shelter? House  ? ?Do you rent/pay a mortgage/own? neither ? ?Do you live with anyone? friend ? ?Are you currently worried about losing your housing? no ? ?Within the past 12 months have you ever stayed outside, in a car, tent, a shelter, or temporarily with someone? yes ? ?Social:  ?What is your current marital status? unmarried ? ?Do you have any children? no ? ?Do you have family or friends who live locally? Aunt who lives in [place] ? ?Food:  ?Gets food stamps around $250 ? ?Income:  ?What is your current source of income? 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 ?Insurance:  ?Are you currently insured? no ? ?Do you have prescription coverage? no ? ?If no insurance, have you applied for coverage (Medicaid, disability, marketplace etc)? ? ?Transportation:  ?Do you have transportation to your medical appointments? Rides his scooter to appts but this is time consuming- took 2 hours to get to clinic today.  Also not consistent due to weather impacting ability to get here. ? ? Daily Health Needs: ?Do you have a working scale at home? No- paramedic to bring out during initial visit ? ?How do you manage your medications at home? Was taking them out of the bottle ? ?Do you ever take your medications differently than prescribed?yes- reports he takes as prescribed when he has medication access but has been out for 1.5 weeks ? ?Do you have issues affording your medications? Yes- referred to HF fund- will be shipped to his house ? ?If yes, has this ever prevented you from obtaining medications? yes ? ?Do you have any concerns with mobility at home? no ? ?Do you use any assistive devices at home or have PCS at home? no ? ?Do you have a PCP? No  ? ?Do you have any trouble reading or writing? no ? ?Are there any additional barriers you see to getting the care you need? No additional barriers reported ? ?CSW  will continue to follow through paramedicine program and assist as needed. ? ?Burna Sis, LCSW ?Clinical Social Worker ?[facility] ?Desk#: [phone] ?Cell#: [phone] ? ? ?

## 2021-08-02 NOTE — Patient Instructions (Signed)
RESTART ALL MEDICATIONS ?-They will be delivered directly to your home ? ?Labs today ?We will only contact you if something comes back abnormal or we need to make some changes. ?Otherwise no news is good news! ? ?You have been referred to [facility] Para [facility] for further CHF management in the comfort of your home. A team member will be in contact with you to arrange a home visit ? ? ?Your physician recommends that you schedule a follow-up appointment in: 2 weeks  in the Advanced Practitioners (PA/NP) Clinic  ? ? ?Do the following things EVERYDAY: ?Weigh yourself in the morning before breakfast. Write it down and keep it in a log. ?Take your medicines as prescribed ?Eat low salt foods--Limit salt (sodium) to 2000 mg per day.  ?Stay as active as you can everyday ?Limit all fluids for the day to less than 2 liters ? ? ? ?You are scheduled for a TEE on Friday 08/05/2021 with Dr. Shirlee Latch.  Please arrive at the [facility] (Main Entrance A) at [facility]: [address] [place], [place] [address] at 9:00 a ? ?DIET: Nothing to eat or drink after midnight except a sip of water with medications (see medication instructions below) ? ?Medication Instructions: ?Hold spironolactone ? ?Continue your anticoagulant: Coumadin ?You will need to continue your anticoagulant after your procedure until you  are told by your  ?Provider that it is safe to stop ? ? ?Labs: pre procedure labs done 08/02/2021 ? ?You must have a responsible person to drive you home and stay in the waiting area during your procedure. Failure to do so could result in cancellation. ? ?Physiological scientist cards. ? ?*Special Note: Every effort is made to have your procedure done on time. Occasionally there are emergencies that occur at the hospital that may cause delays. Please be patient if a delay does occur.  ? ? ?

## 2021-08-03 ENCOUNTER — Telehealth (HOSPITAL_COMMUNITY): Payer: Self-pay

## 2021-08-03 ENCOUNTER — Other Ambulatory Visit (HOSPITAL_COMMUNITY): Payer: Self-pay

## 2021-08-03 NOTE — Telephone Encounter (Signed)
Attempted to reach Mr. Slesinski to establish [employer]. Unable to leave a voicemail. I texted him to return my call. I will continue to reach out.  ?

## 2021-08-05 ENCOUNTER — Ambulatory Visit (HOSPITAL_COMMUNITY): Payer: Self-pay | Admitting: Certified Registered"

## 2021-08-05 ENCOUNTER — Encounter (HOSPITAL_COMMUNITY): Payer: Self-pay | Admitting: Cardiology

## 2021-08-05 ENCOUNTER — Inpatient Hospital Stay (HOSPITAL_COMMUNITY): Payer: Self-pay

## 2021-08-05 ENCOUNTER — Encounter (HOSPITAL_COMMUNITY)
Admission: RE | Disposition: A | Discharge status: Home / Self Care | Payer: Self-pay | Source: Home / Self Care | Attending: Cardiology

## 2021-08-05 ENCOUNTER — Other Ambulatory Visit: Payer: Self-pay

## 2021-08-05 ENCOUNTER — Ambulatory Visit (HOSPITAL_COMMUNITY)
Admission: RE | Admit: 2021-08-05 | Discharge: 2021-08-05 | Disposition: A | Discharge status: Home / Self Care | Payer: Self-pay | Source: Home / Self Care | Attending: Cardiology | Admitting: Cardiology

## 2021-08-05 ENCOUNTER — Inpatient Hospital Stay (HOSPITAL_COMMUNITY)
Admission: RE | Admit: 2021-08-05 | Discharge: 2021-08-13 | DRG: 315 | Disposition: A | Discharge status: Home / Self Care | Payer: Self-pay | Attending: Cardiology | Admitting: Cardiology

## 2021-08-05 DIAGNOSIS — I69351 Hemiplegia and hemiparesis following cerebral infarction affecting right dominant side: Secondary | ICD-10-CM

## 2021-08-05 DIAGNOSIS — Y831 Surgical operation with implant of artificial internal device as the cause of abnormal reaction of the patient, or of later complication, without mention of misadventure at the time of the procedure: Secondary | ICD-10-CM | POA: Diagnosis present

## 2021-08-05 DIAGNOSIS — I509 Heart failure, unspecified: Secondary | ICD-10-CM

## 2021-08-05 DIAGNOSIS — Z7901 Long term (current) use of anticoagulants: Secondary | ICD-10-CM

## 2021-08-05 DIAGNOSIS — T82867A Thrombosis of cardiac prosthetic devices, implants and grafts, initial encounter: Principal | ICD-10-CM | POA: Diagnosis present

## 2021-08-05 DIAGNOSIS — J449 Chronic obstructive pulmonary disease, unspecified: Secondary | ICD-10-CM

## 2021-08-05 DIAGNOSIS — I2510 Atherosclerotic heart disease of native coronary artery without angina pectoris: Secondary | ICD-10-CM | POA: Diagnosis present

## 2021-08-05 DIAGNOSIS — T8209XA Other mechanical complication of heart valve prosthesis, initial encounter: Principal | ICD-10-CM | POA: Diagnosis present

## 2021-08-05 DIAGNOSIS — Z86718 Personal history of other venous thrombosis and embolism: Secondary | ICD-10-CM

## 2021-08-05 DIAGNOSIS — Z716 Tobacco abuse counseling: Secondary | ICD-10-CM

## 2021-08-05 DIAGNOSIS — I739 Peripheral vascular disease, unspecified: Secondary | ICD-10-CM | POA: Diagnosis present

## 2021-08-05 DIAGNOSIS — I252 Old myocardial infarction: Secondary | ICD-10-CM

## 2021-08-05 DIAGNOSIS — Z91148 Patient's other noncompliance with medication regimen for other reason: Secondary | ICD-10-CM

## 2021-08-05 DIAGNOSIS — Z79899 Other long term (current) drug therapy: Secondary | ICD-10-CM

## 2021-08-05 DIAGNOSIS — I081 Rheumatic disorders of both mitral and tricuspid valves: Secondary | ICD-10-CM

## 2021-08-05 DIAGNOSIS — I340 Nonrheumatic mitral (valve) insufficiency: Secondary | ICD-10-CM

## 2021-08-05 DIAGNOSIS — F17210 Nicotine dependence, cigarettes, uncomplicated: Secondary | ICD-10-CM | POA: Diagnosis present

## 2021-08-05 DIAGNOSIS — I708 Atherosclerosis of other arteries: Secondary | ICD-10-CM | POA: Diagnosis present

## 2021-08-05 DIAGNOSIS — Z89432 Acquired absence of left foot: Secondary | ICD-10-CM

## 2021-08-05 DIAGNOSIS — I3489 Other nonrheumatic mitral valve disorders: Secondary | ICD-10-CM

## 2021-08-05 DIAGNOSIS — Z888 Allergy status to other drugs, medicaments and biological substances status: Secondary | ICD-10-CM

## 2021-08-05 DIAGNOSIS — Z7982 Long term (current) use of aspirin: Secondary | ICD-10-CM

## 2021-08-05 DIAGNOSIS — Z8249 Family history of ischemic heart disease and other diseases of the circulatory system: Secondary | ICD-10-CM

## 2021-08-05 DIAGNOSIS — I513 Intracardiac thrombosis, not elsewhere classified: Secondary | ICD-10-CM | POA: Diagnosis present

## 2021-08-05 DIAGNOSIS — I5022 Chronic systolic (congestive) heart failure: Secondary | ICD-10-CM | POA: Diagnosis present

## 2021-08-05 DIAGNOSIS — I255 Ischemic cardiomyopathy: Secondary | ICD-10-CM | POA: Diagnosis present

## 2021-08-05 HISTORY — PX: TEE WITHOUT CARDIOVERSION: SHX5443

## 2021-08-05 LAB — COMPREHENSIVE METABOLIC PANEL
ALT: 43 U/L (ref 0–44)
AST: 39 U/L (ref 15–41)
Albumin: 4 g/dL (ref 3.5–5.0)
Alkaline Phosphatase: 87 U/L (ref 38–126)
Anion gap: 10 (ref 5–15)
BUN: 13 mg/dL (ref 6–20)
CO2: 20 mmol/L — ABNORMAL LOW (ref 22–32)
Calcium: 9.1 mg/dL (ref 8.9–10.3)
Chloride: 109 mmol/L (ref 98–111)
Creatinine, Ser: 1.02 mg/dL (ref 0.61–1.24)
GFR, Estimated: >60 mL/min (ref >60)
Glucose, Bld: 96 mg/dL (ref 70–99)
Potassium: 4 mmol/L (ref 3.5–5.1)
Sodium: 139 mmol/L (ref 135–145)
Total Bilirubin: 1.7 mg/dL — ABNORMAL HIGH (ref 0.3–1.2)
Total Protein: 7 g/dL (ref 6.5–8.1)

## 2021-08-05 LAB — PROTIME-INR
INR: 1.1 (ref 0.8–1.2)
Prothrombin Time: 14.1 seconds (ref 11.4–15.2)

## 2021-08-05 LAB — CBC WITH DIFFERENTIAL/PLATELET
Abs Immature Granulocytes: 0.09 10*3/uL — ABNORMAL HIGH (ref 0.00–0.07)
Basophils Absolute: 0.1 10*3/uL (ref 0.0–0.1)
Basophils Relative: 1 %
Eosinophils Absolute: 0.2 10*3/uL (ref 0.0–0.5)
Eosinophils Relative: 3 %
HCT: 47.8 % (ref 39.0–52.0)
Hemoglobin: 15.8 g/dL (ref 13.0–17.0)
Immature Granulocytes: 1 %
Lymphocytes Relative: 22 %
Lymphs Abs: 2.1 10*3/uL (ref 0.7–4.0)
MCH: 27.8 pg (ref 26.0–34.0)
MCHC: 33.1 g/dL (ref 30.0–36.0)
MCV: 84.2 fL (ref 80.0–100.0)
Monocytes Absolute: 0.9 10*3/uL (ref 0.1–1.0)
Monocytes Relative: 9 %
Neutro Abs: 6.3 10*3/uL (ref 1.7–7.7)
Neutrophils Relative %: 64 %
Platelets: 181 10*3/uL (ref 150–400)
RBC: 5.68 MIL/uL (ref 4.22–5.81)
RDW: 19 % — ABNORMAL HIGH (ref 11.5–15.5)
WBC: 9.6 10*3/uL (ref 4.0–10.5)
nRBC: 0 % (ref 0.0–0.2)

## 2021-08-05 LAB — ECHO TEE: MV VTI: 0.72 cm2

## 2021-08-05 LAB — COOXEMETRY PANEL
Carboxyhemoglobin: 2.8 % — ABNORMAL HIGH (ref 0.5–1.5)
Methemoglobin: 0.8 % (ref 0.0–1.5)
O2 Saturation: 67.5 %
Total hemoglobin: 15.6 g/dL (ref 12.0–16.0)

## 2021-08-05 LAB — APTT: aPTT: 30 seconds (ref 24–36)

## 2021-08-05 LAB — MAGNESIUM: Magnesium: 2 mg/dL (ref 1.7–2.4)

## 2021-08-05 LAB — MRSA NEXT GEN BY PCR, NASAL: MRSA by PCR Next Gen: NOT DETECTED

## 2021-08-05 SURGERY — ECHOCARDIOGRAM, TRANSESOPHAGEAL
Anesthesia: Monitor Anesthesia Care

## 2021-08-05 MED ORDER — SILDENAFIL CITRATE 20 MG PO TABS
40.0000 mg | ORAL_TABLET | Freq: Three times a day (TID) | ORAL | Status: DC
  Administered 2021-08-05 – 2021-08-13 (×25): 40 mg via ORAL
  Filled 2021-08-05 (×27): qty 2

## 2021-08-05 MED ORDER — DIGOXIN 125 MCG PO TABS
0.1250 mg | ORAL_TABLET | Freq: Every day | ORAL | Status: DC
  Administered 2021-08-06 – 2021-08-13 (×8): 0.125 mg via ORAL
  Filled 2021-08-05 (×8): qty 1

## 2021-08-05 MED ORDER — PROPOFOL 500 MG/50ML IV EMUL
INTRAVENOUS | Status: DC | PRN
  Administered 2021-08-05: 150 ug/kg/min via INTRAVENOUS

## 2021-08-05 MED ORDER — METOPROLOL SUCCINATE ER 25 MG PO TB24
12.5000 mg | ORAL_TABLET | Freq: Every day | ORAL | Status: DC
  Administered 2021-08-06 – 2021-08-09 (×4): 12.5 mg via ORAL
  Filled 2021-08-05 (×5): qty 1

## 2021-08-05 MED ORDER — SODIUM CHLORIDE 0.9 % IV SOLN
INTRAVENOUS | Status: AC | PRN
  Administered 2021-08-05: 500 mL via INTRAVENOUS

## 2021-08-05 MED ORDER — ASPIRIN 81 MG PO CHEW
81.0000 mg | CHEWABLE_TABLET | Freq: Every day | ORAL | Status: DC
  Administered 2021-08-06 – 2021-08-13 (×8): 81 mg via ORAL
  Filled 2021-08-05 (×8): qty 1

## 2021-08-05 MED ORDER — DAPAGLIFLOZIN PROPANEDIOL 10 MG PO TABS
10.0000 mg | ORAL_TABLET | Freq: Every day | ORAL | Status: DC
  Administered 2021-08-06 – 2021-08-13 (×8): 10 mg via ORAL
  Filled 2021-08-05 (×8): qty 1

## 2021-08-05 MED ORDER — ROSUVASTATIN CALCIUM 20 MG PO TABS
40.0000 mg | ORAL_TABLET | Freq: Every day | ORAL | Status: DC
  Administered 2021-08-06 – 2021-08-13 (×8): 40 mg via ORAL
  Filled 2021-08-05 (×3): qty 2
  Filled 2021-08-05: qty 1
  Filled 2021-08-05 (×5): qty 2

## 2021-08-05 MED ORDER — SACUBITRIL-VALSARTAN 49-51 MG PO TABS
1.0000 | ORAL_TABLET | Freq: Two times a day (BID) | ORAL | Status: DC
Start: 2021-08-05 — End: 2021-08-14
  Administered 2021-08-05 – 2021-08-13 (×17): 1 via ORAL
  Filled 2021-08-05 (×18): qty 1

## 2021-08-05 MED ORDER — SODIUM CHLORIDE 0.9 % IV SOLN: INTRAVENOUS | Status: DC

## 2021-08-05 MED ORDER — SPIRONOLACTONE 25 MG PO TABS
25.0000 mg | ORAL_TABLET | Freq: Every day | ORAL | Status: DC
  Administered 2021-08-06 – 2021-08-13 (×8): 25 mg via ORAL
  Filled 2021-08-05 (×8): qty 1

## 2021-08-05 MED ORDER — SODIUM CHLORIDE 0.9 % IV SOLN
1.0000 mg/h | INTRAVENOUS | Status: DC
  Administered 2021-08-05: 1 mg/h
  Filled 2021-08-05 (×2): qty 24

## 2021-08-05 MED ORDER — CHLORHEXIDINE GLUCONATE CLOTH 2 % EX PADS
6.0000 | MEDICATED_PAD | Freq: Every day | CUTANEOUS | Status: DC
  Administered 2021-08-05 – 2021-08-09 (×5): 6 via TOPICAL

## 2021-08-05 MED ORDER — SODIUM CHLORIDE 0.9 % IV SOLN
250.0000 mL | INTRAVENOUS | Status: DC | PRN
Start: 2021-08-05 — End: 2021-08-14

## 2021-08-05 MED ORDER — ACETAMINOPHEN 325 MG PO TABS: 650.0000 mg | ORAL_TABLET | ORAL | Status: DC | PRN

## 2021-08-05 MED ORDER — SODIUM CHLORIDE 0.9% FLUSH
3.0000 mL | INTRAVENOUS | Status: DC | PRN
Start: 2021-08-05 — End: 2021-08-14

## 2021-08-05 MED ORDER — ASPIRIN 81 MG PO CHEW: 81.0000 mg | CHEWABLE_TABLET | Freq: Every day | ORAL | Status: DC

## 2021-08-05 MED ORDER — SODIUM CHLORIDE 0.9% FLUSH
3.0000 mL | Freq: Two times a day (BID) | INTRAVENOUS | Status: DC
Start: 2021-08-05 — End: 2021-08-14
  Administered 2021-08-05 – 2021-08-06 (×2): 3 mL via INTRAVENOUS
  Administered 2021-08-06: 10 mL via INTRAVENOUS
  Administered 2021-08-07 – 2021-08-13 (×8): 3 mL via INTRAVENOUS

## 2021-08-05 MED ORDER — PHENYLEPHRINE 80 MCG/ML (10ML) SYRINGE FOR IV PUSH (FOR BLOOD PRESSURE SUPPORT)
PREFILLED_SYRINGE | INTRAVENOUS | Status: DC | PRN
  Administered 2021-08-05: 240 ug via INTRAVENOUS
  Administered 2021-08-05: 160 ug via INTRAVENOUS
  Administered 2021-08-05 (×3): 240 ug via INTRAVENOUS
  Administered 2021-08-05: 160 ug via INTRAVENOUS

## 2021-08-05 MED ORDER — HEPARIN (PORCINE) 25000 UT/250ML-% IV SOLN
INTRAVENOUS | Status: AC
  Filled 2021-08-05: qty 250

## 2021-08-05 NOTE — Telephone Encounter (Signed)
Advanced Heart Failure Patient Advocate Encounter   Patient was approved to receive Entresto from [employer]  Effective dates: 08/05/21 through 08/06/22  Document scanned to chart.   Archer Asa, CPhT

## 2021-08-05 NOTE — Anesthesia Preprocedure Evaluation (Signed)
Anesthesia Evaluation  Patient identified by MRN, date of birth, ID band Patient awake    Reviewed: Allergy & Precautions, NPO status , Patient's Chart, lab work & pertinent test results  Airway Mallampati: II  TM Distance: >3 FB     Dental   Pulmonary asthma , pneumonia, COPD, Current Smoker and Patient abstained from smoking.,    breath sounds clear to auscultation       Cardiovascular + Past MI and +CHF   Rhythm:Regular Rate:Normal  History noted Dr. Nyoka Cowden   Neuro/Psych CVA    GI/Hepatic negative GI ROS, Neg liver ROS,   Endo/Other  negative endocrine ROS  Renal/GU negative Renal ROS     Musculoskeletal   Abdominal   Peds  Hematology   Anesthesia Other Findings   Reproductive/Obstetrics                             Anesthesia Physical Anesthesia Plan  ASA: 3  Anesthesia Plan: MAC   Post-op Pain Management:    Induction: Intravenous  PONV Risk Score and Plan: Treatment may vary due to age or medical condition  Airway Management Planned: Simple Face Mask and Nasal Cannula  Additional Equipment:   Intra-op Plan:   Post-operative Plan:   Informed Consent: I have reviewed the patients History and Physical, chart, labs and discussed the procedure including the risks, benefits and alternatives for the proposed anesthesia with the patient or authorized representative who has indicated his/her understanding and acceptance.     Dental advisory given  Plan Discussed with: CRNA and Anesthesiologist  Anesthesia Plan Comments:         Anesthesia Quick Evaluation

## 2021-08-05 NOTE — Progress Notes (Signed)
  Echocardiogram Echocardiogram Transesophageal has been performed.  Delcie Roch 08/05/2021, 1:20 PM

## 2021-08-05 NOTE — Interval H&P Note (Signed)
History and Physical Interval Note:  08/05/2021 11:12 AM  Mark Garrett  has presented today for surgery, with the diagnosis of abnormal mechanical mitral valve.  The various methods of treatment have been discussed with the patient and family. After consideration of risks, benefits and other options for treatment, the patient has consented to  Procedure(s): TRANSESOPHAGEAL ECHOCARDIOGRAM (TEE) (N/A) as a surgical intervention.  The patient's history has been reviewed, patient examined, no change in status, stable for surgery.  I have reviewed the patient's chart and labs.  Questions were answered to the patient's satisfaction.     [name] [employer]

## 2021-08-05 NOTE — Anesthesia Postprocedure Evaluation (Signed)
Anesthesia Post Note  Patient: Mark Garrett [facility]  Procedure(s) Performed: TRANSESOPHAGEAL ECHOCARDIOGRAM (TEE)     Patient location during evaluation: Endoscopy Anesthesia Type: MAC Level of consciousness: awake Pain management: pain level controlled Vital Signs Assessment: post-procedure vital signs reviewed and stable Respiratory status: spontaneous breathing Cardiovascular status: stable Postop Assessment: no apparent nausea or vomiting Anesthetic complications: no   No notable events documented.  Last Vitals:  Vitals:   08/05/21 1140 08/05/21 1155  BP: 94/69 101/67  Pulse: (!) 57 63  Resp: 14 16  Temp: 36.4 C   SpO2: 91% 98%    Last Pain:  Vitals:   08/05/21 1155  TempSrc:   PainSc: 0-No pain                 Mark Garrett

## 2021-08-05 NOTE — Procedures (Signed)
Central Venous Catheter Insertion Procedure Note  Mark Garrett  [id number]  1980-12-18  Date:08/05/21  Time:1:49 PM   Provider Performing:Jasiah Buntin W Mikey Bussing   Procedure: Insertion of Non-tunneled Central Venous Catheter(36556) with US guidance (78295)   Indication(s) Medication administration, frequent lab draws, prolonged fibrinolytic administration.   Consent Risks of the procedure as well as the alternatives and risks of each were explained to the patient and/or caregiver.  Consent for the procedure was obtained and is signed in the bedside chart  Anesthesia Topical only with 1% lidocaine   Timeout Verified patient identification, verified procedure, site/side was marked, verified correct patient position, special equipment/implants available, medications/allergies/relevant history reviewed, required imaging and test results available.  Sterile Technique Maximal sterile technique including full sterile barrier drape, hand hygiene, sterile gown, sterile gloves, mask, hair covering, sterile ultrasound probe cover (if used).  Procedure Description Area of catheter insertion was cleaned with chlorhexidine and draped in sterile fashion.  With real-time ultrasound guidance a central venous catheter was placed into the right internal jugular vein. Nonpulsatile blood flow and easy flushing noted in all ports.  The catheter was sutured in place and sterile dressing applied.  Complications/Tolerance None; patient tolerated the procedure well. Chest X-ray is ordered to verify placement for internal jugular or subclavian cannulation.   Chest x-ray is not ordered for femoral cannulation.  EBL Minimal  Specimen(s) None   Joneen Roach, AGACNP-BC [facility]  See Amion for personal pager PCCM on call pager [phone] until 7pm. Please call Elink 7p-7a. [phone]  08/05/2021 1:50 PM

## 2021-08-05 NOTE — CV Procedure (Signed)
Procedure: TEE   Indication: Concern for partial thrombosis of mechanical MV.   Sedation: Per anesthesiology  Findings: Please see echo section for full report.  Normal LV size with mild LV hypertrophy.  EF 35-40% with inferior akinesis. Normal RV size with moderately decreased systolic function.  The left atrium was moderately dilated, no LA appendage thrombus.  The RA was mildly dilated.  No PFO or ASD by color doppler.  Trileaflet aortic valve with no stenosis or regurgitation.  Trivial TR.  There was a mechanical On-X mitral valve.  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  Mean gradient 12 mmHg, MVA 0.73 cm^2 by VTI.  Minimal mitral regurgitation (physiological for valve).  Normal caliber thoracic aorta.   Patient will need admission to CCU, possible thrombolysis for thrombosed valve.   Loralie Champagne 08/05/2021 12:00 PM
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
## 2021-08-05 NOTE — Progress Notes (Signed)
Heart Failure Navigator Progress Note  Assessed for Heart & Vascular TOC clinic readiness.  Patient does not meet criteria due to a patient of Advanced heart Failure Team.      Rhae Hammock, BSN, RN Heart Failure Print production planner Chat Only

## 2021-08-05 NOTE — TOC Initial Note (Addendum)
Transition of Care [facility]) - Initial/Assessment Note    Patient Details  Name: Mark Garrett MRN: [id number] Date of Birth: 04-Aug-1980  Transition of Care [facility]) CM/SW Contact:    Nasteho Glantz, LCSW Phone Number: 08/05/2021, 3:05 PM  Clinical Narrative:                 HF CSW reached out to [employer] to see about screening the patient for Medicaid. Patient will benefit from PT/OT consult for disposition recommendations.   TOC will continue to follow for DC needs.        Patient Goals and CMS Choice        Expected Discharge Plan and Services                                                Prior Living Arrangements/Services                       Activities of Daily Living Home Assistive Devices/Equipment: Prosthesis ADL Screening (condition at time of admission) Patient's cognitive ability adequate to safely complete daily activities?: Yes Is the patient deaf or have difficulty hearing?: No Does the patient have difficulty seeing, even when wearing glasses/contacts?: No Does the patient have difficulty concentrating, remembering, or making decisions?: No Patient able to express need for assistance with ADLs?: Yes Does the patient have difficulty dressing or bathing?: No Independently performs ADLs?: Yes (appropriate for developmental age) Does the patient have difficulty walking or climbing stairs?: No Weakness of Legs: None Weakness of Arms/Hands: None  Permission Sought/Granted                  Emotional Assessment              Admission diagnosis:  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 [T82.09XA] Thrombus in heart chamber [I51.3] Patient Active Problem List   Diagnosis Date Noted   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 08/05/2021   Thrombus in heart chamber 08/05/2021   Hyperlipidemia 06/21/2021   Acute CVA (cerebrovascular accident) ([place])    Pressure injury of skin 02/20/2021   S/P MVR  (mitral valve replacement) 02/17/2021   S/P CABG x 1 02/17/2021   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[place])    Protein-calorie malnutrition, severe 02/03/2021   Endocarditis of mitral valve    Long term current use of anticoagulant therapy    Encounter for preoperative dental examination    [place]    Retained tooth root    Chronic apical periodontitis    Accretions on teeth    Teeth missing    Excessive dental attrition    Chronic periodontitis    Cardiogenic shock ([place]) 01/30/2021   Severe mitral regurgitation 01/29/2021   Serum total bilirubin elevated 01/29/2021   Paroxysmal VT ([place]) [id number]   Acute systolic CHF (congestive heart failure) ([place]) 01/28/2021   CAP (community acquired pneumonia) 01/27/2021   Pleural effusion on right 01/27/2021   Leukocytosis 01/27/2021   Microcytic anemia 01/27/2021   Hypoalbuminemia 01/27/2021   Elevated brain natriuretic peptide (BNP) level 01/27/2021   NSTEMI (non-ST elevated myocardial infarction) ([place]) 01/27/2021   DVT (deep venous thrombosis) ([place]) 01/27/2021   Diarrhea 01/27/2021   Cigarette smoker 01/27/2021   COPD (chronic obstructive pulmonary disease) ([place]) 01/27/2021   Asthma 01/27/2021   PCP:  Pcp, No Pharmacy:  [facility], [place] - [address] [place] [address] Phone: [phone] Fax: [phone]  [facility] [address] [place] [address] Phone: [phone] Fax: [place], [place] [address] Phone: [phone] Fax: [place] [address] [place] [address] Phone: [phone] Fax: [place] [address] Phone: [phone] Fax:  [phone]     Social Determinants of Health (SDOH) Interventions    Readmission Risk Interventions     View : No data to display.

## 2021-08-05 NOTE — Transfer of Care (Signed)
Immediate Anesthesia Transfer of Care Note  Patient: Mark Garrett [facility]  Procedure(s) Performed: TRANSESOPHAGEAL ECHOCARDIOGRAM (TEE)  Patient Location: PACU  Anesthesia Type:MAC  Level of Consciousness: awake and oriented  Airway & Oxygen Therapy: Patient Spontanous Breathing and Patient connected to nasal cannula oxygen  Post-op Assessment: Report given to RN  Post vital signs: Reviewed and stable  Last Vitals:  Vitals Value Taken Time  BP 94/69 08/05/21 1137  Temp    Pulse 57 08/05/21 1138  Resp 21 08/05/21 1138  SpO2 92 % 08/05/21 1138  Vitals shown include unvalidated device data.  Last Pain:  Vitals:   08/05/21 0925  TempSrc: Temporal         Complications: No notable events documented.

## 2021-08-06 ENCOUNTER — Inpatient Hospital Stay (HOSPITAL_COMMUNITY): Payer: Self-pay

## 2021-08-06 ENCOUNTER — Other Ambulatory Visit (HOSPITAL_COMMUNITY): Payer: Medicaid Other

## 2021-08-06 DIAGNOSIS — I513 Intracardiac thrombosis, not elsewhere classified: Secondary | ICD-10-CM

## 2021-08-06 LAB — CBC
HCT: 48.8 % (ref 39.0–52.0)
Hemoglobin: 15.5 g/dL (ref 13.0–17.0)
MCH: 27.4 pg (ref 26.0–34.0)
MCHC: 31.8 g/dL (ref 30.0–36.0)
MCV: 86.4 fL (ref 80.0–100.0)
Platelets: 159 10*3/uL (ref 150–400)
RBC: 5.65 MIL/uL (ref 4.22–5.81)
RDW: 19.2 % — ABNORMAL HIGH (ref 11.5–15.5)
WBC: 10.1 10*3/uL (ref 4.0–10.5)
nRBC: 0 % (ref 0.0–0.2)

## 2021-08-06 LAB — ECHOCARDIOGRAM LIMITED
Height: 71 in
Weight: 2836 oz

## 2021-08-06 LAB — BASIC METABOLIC PANEL
Anion gap: 7 (ref 5–15)
BUN: 9 mg/dL (ref 6–20)
CO2: 21 mmol/L — ABNORMAL LOW (ref 22–32)
Calcium: 8.4 mg/dL — ABNORMAL LOW (ref 8.9–10.3)
Chloride: 111 mmol/L (ref 98–111)
Creatinine, Ser: 1.04 mg/dL (ref 0.61–1.24)
GFR, Estimated: >60 mL/min (ref >60)
Glucose, Bld: 92 mg/dL (ref 70–99)
Potassium: 3.5 mmol/L (ref 3.5–5.1)
Sodium: 139 mmol/L (ref 135–145)

## 2021-08-06 LAB — COOXEMETRY PANEL
Carboxyhemoglobin: 2.1 % — ABNORMAL HIGH (ref 0.5–1.5)
Methemoglobin: <0.7 % (ref 0.0–1.5)
O2 Saturation: 74.9 %
Total hemoglobin: 16.3 g/dL — ABNORMAL HIGH (ref 12.0–16.0)

## 2021-08-06 LAB — PROTIME-INR
INR: 1.2 (ref 0.8–1.2)
Prothrombin Time: 14.8 seconds (ref 11.4–15.2)

## 2021-08-06 MED ORDER — ONDANSETRON HCL 4 MG/2ML IJ SOLN: 4.0000 mg | Freq: Four times a day (QID) | INTRAMUSCULAR | Status: DC | PRN

## 2021-08-06 MED ORDER — SODIUM CHLORIDE 0.9 % IV SOLN: 250.0000 mL | INTRAVENOUS | Status: DC | PRN

## 2021-08-06 MED ORDER — SODIUM CHLORIDE 0.9% FLUSH: 10.0000 mL | INTRAVENOUS | Status: DC | PRN

## 2021-08-06 MED ORDER — MORPHINE SULFATE (PF) 4 MG/ML IV SOLN: 5.0000 mg | INTRAVENOUS | Status: DC | PRN

## 2021-08-06 MED ORDER — SODIUM CHLORIDE 0.9 % IV SOLN
1.0000 mg/h | INTRAVENOUS | Status: AC
  Administered 2021-08-06 – 2021-08-07 (×2): 1 mg/h
  Filled 2021-08-06 (×5): qty 24

## 2021-08-06 MED ORDER — MIDAZOLAM HCL 2 MG/2ML IJ SOLN: 1.0000 mg | INTRAMUSCULAR | Status: DC | PRN

## 2021-08-06 MED ORDER — SODIUM CHLORIDE 0.9% FLUSH
10.0000 mL | Freq: Two times a day (BID) | INTRAVENOUS | Status: DC
  Administered 2021-08-06 – 2021-08-09 (×6): 10 mL

## 2021-08-06 MED ORDER — SODIUM CHLORIDE 0.9% FLUSH: 3.0000 mL | Freq: Two times a day (BID) | INTRAVENOUS | Status: DC

## 2021-08-06 MED ORDER — SODIUM CHLORIDE 0.9% FLUSH: 3.0000 mL | INTRAVENOUS | Status: DC | PRN
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
## 2021-08-06 NOTE — Plan of Care (Signed)
  Problem: Clinical Measurements: Goal: Ability to maintain clinical measurements within normal limits will improve Outcome: Progressing Goal: Will remain free from infection Outcome: Progressing Goal: Diagnostic test results will improve Outcome: Progressing Goal: Respiratory complications will improve Outcome: Progressing Goal: Cardiovascular complication will be avoided Outcome: Progressing   Problem: Nutrition: Goal: Adequate nutrition will be maintained Outcome: Progressing   Problem: Coping: Goal: Level of anxiety will decrease Outcome: Progressing   Problem: Elimination: Goal: Will not experience complications related to urinary retention Outcome: Completed/Met   Problem: Pain Managment: Goal: General experience of comfort will improve Outcome: Progressing   Problem: Safety: Goal: Ability to remain free from injury will improve Outcome: Progressing   Problem: Skin Integrity: Goal: Risk for impaired skin integrity will decrease Outcome: Progressing

## 2021-08-06 NOTE — Progress Notes (Signed)
Echocardiogram 2D Echocardiogram has been performed.  Leta Jungling M 08/06/2021, 12:35 PM

## 2021-08-07 ENCOUNTER — Inpatient Hospital Stay (HOSPITAL_COMMUNITY): Payer: Self-pay

## 2021-08-07 DIAGNOSIS — I513 Intracardiac thrombosis, not elsewhere classified: Secondary | ICD-10-CM

## 2021-08-07 LAB — PROTIME-INR
INR: 1.2 (ref 0.8–1.2)
Prothrombin Time: 15.3 seconds — ABNORMAL HIGH (ref 11.4–15.2)

## 2021-08-07 LAB — BASIC METABOLIC PANEL
Anion gap: 8 (ref 5–15)
BUN: 7 mg/dL (ref 6–20)
CO2: 23 mmol/L (ref 22–32)
Calcium: 8.9 mg/dL (ref 8.9–10.3)
Chloride: 106 mmol/L (ref 98–111)
Creatinine, Ser: 1.09 mg/dL (ref 0.61–1.24)
GFR, Estimated: >60 mL/min (ref >60)
Glucose, Bld: 96 mg/dL (ref 70–99)
Potassium: 3.5 mmol/L (ref 3.5–5.1)
Sodium: 137 mmol/L (ref 135–145)

## 2021-08-07 LAB — CBC
HCT: 50.5 % (ref 39.0–52.0)
Hemoglobin: 15.9 g/dL (ref 13.0–17.0)
MCH: 27.1 pg (ref 26.0–34.0)
MCHC: 31.5 g/dL (ref 30.0–36.0)
MCV: 86.2 fL (ref 80.0–100.0)
Platelets: 153 10*3/uL (ref 150–400)
RBC: 5.86 MIL/uL — ABNORMAL HIGH (ref 4.22–5.81)
RDW: 19 % — ABNORMAL HIGH (ref 11.5–15.5)
WBC: 11.4 10*3/uL — ABNORMAL HIGH (ref 4.0–10.5)
nRBC: 0 % (ref 0.0–0.2)

## 2021-08-07 LAB — ECHOCARDIOGRAM LIMITED
Area-P 1/2: 1.37 cm2
Height: 71 in
S' Lateral: 6 cm
Single Plane A4C EF: 24.5 %
Weight: 2839.52 oz

## 2021-08-07 LAB — FIBRINOGEN: Fibrinogen: 234 mg/dL (ref 210–475)

## 2021-08-07 MED ORDER — POTASSIUM CHLORIDE CRYS ER 20 MEQ PO TBCR
40.0000 meq | EXTENDED_RELEASE_TABLET | Freq: Once | ORAL | Status: AC
  Administered 2021-08-07: 40 meq via ORAL
  Filled 2021-08-07: qty 2

## 2021-08-07 NOTE — Progress Notes (Signed)
ANTICOAGULATION CONSULT NOTE - Initial Consult  Pharmacy Consult for TPA Indication: mechanical mitral valve thrombus  Allergies  Allergen Reactions   Iodide Rash    redness    Patient Measurements: Height: 5\' 11"  (180.3 cm) Weight: 80.5 kg (177 lb 7.5 oz) IBW/kg (Calculated) : 75.3   Vital Signs: Temp: 98.5 F (36.9 C) (05/21 0500) Temp Source: Oral (05/21 0500) BP: 131/64 (05/21 0600) Pulse Rate: 68 (05/21 0700)  Labs: Recent Labs    08/05/21 1456 08/06/21 0440 08/07/21 0514  HGB 15.8 15.5 15.9  HCT 47.8 48.8 50.5  PLT 181 159 153  APTT 30  --   --   LABPROT 14.1 14.8 15.3*  INR 1.1 1.2 1.2  CREATININE 1.02 1.04 1.09    Estimated Creatinine Clearance: 93.1 mL/min (by C-G formula based on SCr of 1.09 mg/dL).   Medical History: Past Medical History:  Diagnosis Date   COPD (chronic obstructive pulmonary disease) ([place])    DVT (deep venous thrombosis) ([facility])     Assessment: 41yom with mechanical mitral valve admitted with valve thrombus and INR < 2  - med non-compliance.   Started on tpa 1mg /hr per [facility] paper- in shadow chart and daily TTE to evaluate clot size> improving.  No bleeding noted   Goal of Therapy:  Heparin level 0.3-0.7 units/ml Monitor platelets by anticoagulation protocol: Yes   Plan:  Continue TPA 1mg /hr for now  Re-evaluate in am  Begin heparin drip when clot improved and tpa stopped Daily CBC  Monitor s/s bleeding    Bonnita Nasuti Pharm.D. CPP, BCPS Clinical Pharmacist [phone] 08/07/2021 8:20 AM
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
## 2021-08-08 ENCOUNTER — Inpatient Hospital Stay (HOSPITAL_COMMUNITY): Payer: Self-pay

## 2021-08-08 DIAGNOSIS — I5023 Acute on chronic systolic (congestive) heart failure: Secondary | ICD-10-CM

## 2021-08-08 DIAGNOSIS — I5022 Chronic systolic (congestive) heart failure: Secondary | ICD-10-CM

## 2021-08-08 LAB — BASIC METABOLIC PANEL
Anion gap: 7 (ref 5–15)
BUN: 10 mg/dL (ref 6–20)
CO2: 24 mmol/L (ref 22–32)
Calcium: 9.1 mg/dL (ref 8.9–10.3)
Chloride: 106 mmol/L (ref 98–111)
Creatinine, Ser: 1.1 mg/dL (ref 0.61–1.24)
GFR, Estimated: >60 mL/min (ref >60)
Glucose, Bld: 93 mg/dL (ref 70–99)
Potassium: 3.8 mmol/L (ref 3.5–5.1)
Sodium: 137 mmol/L (ref 135–145)

## 2021-08-08 LAB — CBC
HCT: 49.1 % (ref 39.0–52.0)
Hemoglobin: 16.3 g/dL (ref 13.0–17.0)
MCH: 27.8 pg (ref 26.0–34.0)
MCHC: 33.2 g/dL (ref 30.0–36.0)
MCV: 83.8 fL (ref 80.0–100.0)
Platelets: 157 10*3/uL (ref 150–400)
RBC: 5.86 MIL/uL — ABNORMAL HIGH (ref 4.22–5.81)
RDW: 18.8 % — ABNORMAL HIGH (ref 11.5–15.5)
WBC: 11.6 10*3/uL — ABNORMAL HIGH (ref 4.0–10.5)
nRBC: 0 % (ref 0.0–0.2)

## 2021-08-08 LAB — COOXEMETRY PANEL
Carboxyhemoglobin: 1.5 % (ref 0.5–1.5)
Methemoglobin: <0.7 % (ref 0.0–1.5)
O2 Saturation: 79 %
Total hemoglobin: 16.8 g/dL — ABNORMAL HIGH (ref 12.0–16.0)

## 2021-08-08 LAB — ECHOCARDIOGRAM LIMITED
Area-P 1/2: 1.93 cm2
Height: 71 in
MV VTI: 1.29 cm2
P 1/2 time: 153 msec
Weight: 2839.52 oz

## 2021-08-08 LAB — PROTIME-INR
INR: 1.1 (ref 0.8–1.2)
Prothrombin Time: 14.5 seconds (ref 11.4–15.2)

## 2021-08-08 LAB — HEPARIN LEVEL (UNFRACTIONATED): Heparin Unfractionated: <0.1 IU/mL — ABNORMAL LOW (ref 0.30–0.70)

## 2021-08-08 MED ORDER — WARFARIN - PHARMACIST DOSING INPATIENT: Freq: Every day | Status: DC

## 2021-08-08 MED ORDER — "THROMBI-PAD 3""X3"" EX PADS"
1.0000 | MEDICATED_PAD | Freq: Once | CUTANEOUS | Status: AC
  Administered 2021-08-08: 1 via TOPICAL
  Filled 2021-08-08: qty 1

## 2021-08-08 MED ORDER — SILVER NITRATE-POT NITRATE 75-25 % EX MISC: 1.0000 | CUTANEOUS | Status: DC | PRN

## 2021-08-08 MED ORDER — HEPARIN (PORCINE) 25000 UT/250ML-% IV SOLN
1350.0000 [IU]/h | INTRAVENOUS | Status: DC
  Administered 2021-08-08: 800 [IU]/h via INTRAVENOUS
  Administered 2021-08-09: 1200 [IU]/h via INTRAVENOUS
  Administered 2021-08-11 – 2021-08-12 (×3): 1350 [IU]/h via INTRAVENOUS
  Filled 2021-08-08 (×6): qty 250

## 2021-08-08 MED ORDER — WARFARIN SODIUM 5 MG PO TABS
5.0000 mg | ORAL_TABLET | Freq: Once | ORAL | Status: AC
  Administered 2021-08-08: 5 mg via ORAL
  Filled 2021-08-08: qty 1

## 2021-08-08 NOTE — Progress Notes (Signed)
ANTICOAGULATION CONSULT NOTE - Consult  Pharmacy Consult for TPA > heparin + warfarin  Indication: mechanical mitral valve thrombus  Allergies  Allergen Reactions   Iodide Rash    redness    Patient Measurements: Height: 5\' 11"  (180.3 cm) Weight: 80.5 kg (177 lb 7.5 oz) IBW/kg (Calculated) : 75.3   Vital Signs: Temp: 98.3 F (36.8 C) (05/22 0800) Temp Source: Oral (05/22 0800) BP: 123/82 (05/22 1400) Pulse Rate: 61 (05/22 1400)  Labs: Recent Labs    08/06/21 0440 08/07/21 0514 08/08/21 0400  HGB 15.5 15.9 16.3  HCT 48.8 50.5 49.1  PLT 159 153 157  LABPROT 14.8 15.3* 14.5  INR 1.2 1.2 1.1  CREATININE 1.04 1.09 1.10     Estimated Creatinine Clearance: 92.2 mL/min (by C-G formula based on SCr of 1.1 mg/dL).   Medical History: Past Medical History:  Diagnosis Date   COPD (chronic obstructive pulmonary disease) ([place])    DVT (deep venous thrombosis) ([facility])     Assessment: 41yom with mechanical mitral valve admitted with valve thrombus and INR < 2  - med non-compliance.   Started on tpa 1mg /hr per [facility] 2 paper- in shadow chart and daily TTE to evaluate clot size> improving plan to stop tpa 5/22 after 72hr infusion and reduced clot burden monitored via TTE.   Begin IV heparin drip and warfarin until INR >2  Plan to re-evaluate mitral valve via TEE 5/23 Slight oozing at central line insertion site, cbc stable   Goal of Therapy:  Heparin level 0.3-0.7 units/ml Monitor platelets by anticoagulation protocol: Yes   Plan:  Stop TPA  Begin heparin drip 800 uts/hr - no bolus post TPA infusion Heparin level 6hr after start  Warfarin 5mg  x1 Daily CBC protime and heparin level Monitor s/s bleeding    Bonnita Nasuti Pharm.D. CPP, BCPS Clinical Pharmacist [phone] 08/08/2021 3:23 PM
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
## 2021-08-08 NOTE — Progress Notes (Signed)
ANTICOAGULATION CONSULT NOTE  Pharmacy Consult for heparin Indication: mechanical mitral valve thrombus Brief A/P: Heparin level subtherapeutic Increase Heparin rate  Allergies  Allergen Reactions   Iodide Rash    redness    Patient Measurements: Height: 5\' 11"  (180.3 cm) Weight: 80.5 kg (177 lb 7.5 oz) IBW/kg (Calculated) : 75.3   Vital Signs: Temp: 98.7 F (37.1 C) (05/22 1900) Temp Source: Oral (05/22 1900) BP: 143/85 (05/22 2100) Pulse Rate: 66 (05/22 2100)  Labs: Recent Labs    08/06/21 0440 08/07/21 0514 08/08/21 0400 08/08/21 2219  HGB 15.5 15.9 16.3  --   HCT 48.8 50.5 49.1  --   PLT 159 153 157  --   LABPROT 14.8 15.3* 14.5  --   INR 1.2 1.2 1.1  --   HEPARINUNFRC  --   --   --  <0.10*  CREATININE 1.04 1.09 1.10  --      Estimated Creatinine Clearance: 92.2 mL/min (by C-G formula based on SCr of 1.1 mg/dL).  Assessment: 41 yo male with On-X mitral valve replacement s/p alteplase infusion for mitral valve thrombus for heparin    Goal of Therapy:  Heparin level 0.3-0.7 units/ml Monitor platelets by anticoagulation protocol: Yes   Plan:  Increase Heparin 1000 units/hr Check heparin level in 8 hours.    Phillis Knack, PharmD, BCPS  08/08/2021 11:20 PM

## 2021-08-08 NOTE — Anesthesia Preprocedure Evaluation (Addendum)
Anesthesia Evaluation  Patient identified by MRN, date of birth, ID band Patient awake    Reviewed: Allergy & Precautions, NPO status , Patient's Chart, lab work & pertinent test results  Airway Mallampati: II  TM Distance: >3 FB Neck ROM: Full    Dental  (+) Edentulous Upper, Edentulous Lower   Pulmonary asthma , COPD, Current Smoker and Patient abstained from smoking.,    Pulmonary exam normal        Cardiovascular + CAD, + Past MI, + Cardiac Stents (2006), +CHF and + DVT  + Valvular Problems/Murmurs (MS (mean PG 42mHg))  Rhythm:Regular Rate:Normal  Mechanical MV thrombosis   Neuro/Psych CVA negative psych ROS   GI/Hepatic negative GI ROS, Neg liver ROS,   Endo/Other  negative endocrine ROS  Renal/GU negative Renal ROS  negative genitourinary   Musculoskeletal negative musculoskeletal ROS (+)   Abdominal Normal abdominal exam  (+)   Peds  Hematology  (+) Blood dyscrasia, anemia , Lab Results      Component                Value               Date                      WBC                      11.6 (H)            08/08/2021                HGB                      16.3                08/08/2021                HCT                      49.1                08/08/2021                MCV                      83.8                08/08/2021                PLT                      157                 08/08/2021           Lab Results      Component                Value               Date                      NA                       137                 08/08/2021  K                        3.8                 08/08/2021                CO2                      24                  08/08/2021                GLUCOSE                  93                  08/08/2021                BUN                      10                  08/08/2021                CREATININE               1.10                08/08/2021                 CALCIUM                  9.1                 08/08/2021                GFRNONAA                 >60                 08/08/2021             Anesthesia Other Findings   Reproductive/Obstetrics                            Anesthesia Physical Anesthesia Plan  ASA: 3  Anesthesia Plan: MAC   Post-op Pain Management:    Induction: Intravenous  PONV Risk Score and Plan: Propofol infusion and Treatment may vary due to age or medical condition  Airway Management Planned: Simple Face Mask, Natural Airway and Nasal Cannula  Additional Equipment: None  Intra-op Plan:   Post-operative Plan:   Informed Consent: I have reviewed the patients History and Physical, chart, labs and discussed the procedure including the risks, benefits and alternatives for the proposed anesthesia with the patient or authorized representative who has indicated his/her understanding and acceptance.     Dental advisory given  Plan Discussed with: CRNA  Anesthesia Plan Comments: (ECHO 05/23: 1. Limited study to assess mechanical MVR.  2. The mitral valve has been repaired/replaced. There is a ON-X 27-29  present in the mitral position. Mean gradient improved to 49mHg at HR  64bpm. Leaflet excursion appears improved from prior studies (best seen on  PLAX). There continues to be a mobile  echodensity on the atrial side of the leaflets. Difficult to measure but  based on limited views, appears to be slightly smaller from previous  (~  2.3cmx1.2cm).  3. Left ventricular ejection fraction, by estimation, is 30 to 35%. The  left ventricle has moderately decreased function.  4. The inferior vena cava is normal in size with greater than 50%  respiratory variability, suggesting right atrial pressure of 3 mmHg.   Comparison(s): Compared to prior study on 08/07/21, the mean mitral  gradient is now 56mHg (previously 42mg). Similar to study on 08/06/21,  there continues to be an echodensity on  the atrial side of the mitral  valve leaflets. )       Anesthesia Quick Evaluation

## 2021-08-08 NOTE — Progress Notes (Signed)
Echocardiogram 2D Echocardiogram has been performed.  Warren Lacy Porfirio Bollier RDCS 08/08/2021, 9:01 AM

## 2021-08-08 NOTE — Progress Notes (Signed)
ANTICOAGULATION CONSULT NOTE - Consult  Pharmacy Consult for TPA > heparin + warfarin  Indication: mechanical mitral valve thrombus  Allergies  Allergen Reactions   Iodide Rash    redness    Patient Measurements: Height: 5\' 11"  (180.3 cm) Weight: 80.5 kg (177 lb 7.5 oz) IBW/kg (Calculated) : 75.3   Vital Signs: Temp: 98.3 F (36.8 C) (05/22 0800) Temp Source: Oral (05/22 0800) BP: 123/82 (05/22 1400) Pulse Rate: 61 (05/22 1400)  Labs: Recent Labs    08/06/21 0440 08/07/21 0514 08/08/21 0400  HGB 15.5 15.9 16.3  HCT 48.8 50.5 49.1  PLT 159 153 157  LABPROT 14.8 15.3* 14.5  INR 1.2 1.2 1.1  CREATININE 1.04 1.09 1.10     Estimated Creatinine Clearance: 92.2 mL/min (by C-G formula based on SCr of 1.1 mg/dL).   Medical History: Past Medical History:  Diagnosis Date   COPD (chronic obstructive pulmonary disease) ([place])    DVT (deep venous thrombosis) ([facility])     Assessment: 41yom with mechanical mitral valve admitted with valve thrombus and INR < 2  - med non-compliance.   Started on tpa 1mg /hr per [facility] paper- in shadow chart and daily TTE to evaluate clot size> improving plan to stop tpa 5/22 after 72hr infusion and reduced clot burden monitored via TTE.   Begin IV heparin drip and warfarin until INR >2  Plan to re-evaluate mitral valve via TEE 5/23  Goal of Therapy:  Heparin level 0.3-0.7 units/ml Monitor platelets by anticoagulation protocol: Yes   Plan:  Stop TPA  Begin heparin drip 800 uts/hr - no bolus post TPA infusion Heparin level 6hr after start  Warfarin 5mg  x1 Daily CBC protime and heparin level Monitor s/s bleeding    Bonnita Nasuti Pharm.D. CPP, BCPS Clinical Pharmacist [phone] 08/08/2021 3:19 PM

## 2021-08-08 NOTE — TOC Initial Note (Signed)
Transition of Care [facility]) - Initial/Assessment Note    Patient Details  Name: Mark Garrett MRN: [id number] Date of Birth: 04-24-1980  Transition of Care [facility]) CM/SW Contact:    Erenest Rasher, RN Phone Number: [phone] 08/08/2021, 2:57 PM  Clinical Narrative:                 HF TOC CM spoke to pt. States he works full-time. Lives with roommates. He reports his friend will provide transportation home. Will need MATCH/HF Funds to pay for meds. Scheduled appt with Patient Drexel 08/19/2021 at 1:00 pm. Pt has been sent to [facility] for Medicaid by HF CSW. Will need meds to come up from [place].   Expected Discharge Plan: Home/Self Care Barriers to Discharge: Continued Medical Work up   Patient Goals and CMS Choice Patient states their goals for this hospitalization and ongoing recovery are:: wants to get back to working      Expected Discharge Plan and Services Expected Discharge Plan: Home/Self Care   Discharge Planning Services: CM Consult, Medication Assistance, MATCH Program, Follow-up appt scheduled   Living arrangements for the past 2 months: Single Family Home                                      Prior Living Arrangements/Services Living arrangements for the past 2 months: Single Family Home Lives with:: Roommate Patient language and need for interpreter reviewed:: Yes Do you feel safe going back to the place where you live?: Yes      Need for Family Participation in Patient Care: No (Comment) Care giver support system in place?: No (comment)   Criminal Activity/Legal Involvement Pertinent to Current Situation/Hospitalization: No - Comment as needed  Activities of Daily Living Home Assistive Devices/Equipment: Prosthesis ADL Screening (condition at time of admission) Patient's cognitive ability adequate to safely complete daily activities?: Yes Is the patient deaf or have difficulty hearing?: No Does the patient have difficulty seeing,  even when wearing glasses/contacts?: No Does the patient have difficulty concentrating, remembering, or making decisions?: No Patient able to express need for assistance with ADLs?: Yes Does the patient have difficulty dressing or bathing?: No Independently performs ADLs?: Yes (appropriate for developmental age) Does the patient have difficulty walking or climbing stairs?: No Weakness of Legs: None Weakness of Arms/Hands: None  Permission Sought/Granted Permission sought to share information with : Case Manager, Family Supports                Emotional Assessment Appearance:: Appears stated age Attitude/Demeanor/Rapport: Engaged Affect (typically observed): Accepting Orientation: : Oriented to Self, Oriented to Place, Oriented to  Time, Oriented to Situation   Psych Involvement: No (comment)  Admission diagnosis:  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 [T82.09XA] Thrombus in heart chamber [I51.3] Patient Active Problem List   Diagnosis Date Noted   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 08/05/2021   Thrombus in heart chamber 08/05/2021   Hyperlipidemia 06/21/2021   Acute CVA (cerebrovascular accident) ([place])    Pressure injury of skin 02/20/2021   S/P MVR (mitral valve replacement) 02/17/2021   S/P CABG x 1 02/17/2021   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[place])    Protein-calorie malnutrition, severe 02/03/2021   Endocarditis of mitral valve    Long term current use of anticoagulant therapy    Encounter for preoperative dental examination    [place]  Retained tooth root    Chronic apical periodontitis    Accretions on teeth    Teeth missing    Excessive dental attrition    Chronic periodontitis    Cardiogenic shock ([place]) 01/30/2021   Severe mitral regurgitation 01/29/2021   Serum total bilirubin elevated 01/29/2021   Paroxysmal VT ([place]) [id number]   Acute systolic CHF (congestive heart failure) ([place]) 01/28/2021   CAP (community  acquired pneumonia) 01/27/2021   Pleural effusion on right 01/27/2021   Leukocytosis 01/27/2021   Microcytic anemia 01/27/2021   Hypoalbuminemia 01/27/2021   Elevated brain natriuretic peptide (BNP) level 01/27/2021   NSTEMI (non-ST elevated myocardial infarction) ([place]) 01/27/2021   DVT (deep venous thrombosis) ([place]) 01/27/2021   Diarrhea 01/27/2021   Cigarette smoker 01/27/2021   COPD (chronic obstructive pulmonary disease) ([place]) 01/27/2021   Asthma 01/27/2021   PCP:  Merryl Hacker, No Pharmacy:   [facility] [place], [place] - [address] [place] [address] Phone: [phone] Fax: [phone]  [facility] [address] [place] [address] Phone: [phone] Fax: [place], [place] [address] Phone: [phone] Fax: [place] [address] [place] [address] Phone: [phone] Fax: [place] [address] Phone: [phone] Fax: [phone]     Social Determinants of Health (SDOH) Interventions    Readmission Risk Interventions     View : No data to display.

## 2021-08-08 NOTE — TOC Progression Note (Signed)
Transition of Care [facility]) - Progression Note    Patient Details  Name: Mark Garrett MRN: [id number] Date of Birth: 1980-05-17  Transition of Care [facility]) CM/SW Contact  Johan Antonacci, LCSW Phone Number: 08/08/2021, 10:20 AM  Clinical Narrative:    HF CSW heard back from [facility] that Mr. Loya is already assigned with [facility] and will be screened for Medicaid.  TOC will continue to follow for DC needs.        Expected Discharge Plan and Services                                                 Social Determinants of Health (SDOH) Interventions    Readmission Risk Interventions     View : No data to display.

## 2021-08-09 ENCOUNTER — Inpatient Hospital Stay (HOSPITAL_COMMUNITY): Payer: Self-pay

## 2021-08-09 ENCOUNTER — Inpatient Hospital Stay (HOSPITAL_COMMUNITY): Payer: Self-pay | Admitting: Anesthesiology

## 2021-08-09 ENCOUNTER — Encounter (HOSPITAL_COMMUNITY)
Admission: RE | Disposition: A | Discharge status: Home / Self Care | Payer: Self-pay | Source: Home / Self Care | Attending: Cardiology

## 2021-08-09 ENCOUNTER — Encounter (HOSPITAL_COMMUNITY): Payer: Self-pay | Admitting: Cardiology

## 2021-08-09 DIAGNOSIS — I5043 Acute on chronic combined systolic (congestive) and diastolic (congestive) heart failure: Secondary | ICD-10-CM

## 2021-08-09 DIAGNOSIS — I252 Old myocardial infarction: Secondary | ICD-10-CM

## 2021-08-09 DIAGNOSIS — I2510 Atherosclerotic heart disease of native coronary artery without angina pectoris: Secondary | ICD-10-CM

## 2021-08-09 DIAGNOSIS — I081 Rheumatic disorders of both mitral and tricuspid valves: Secondary | ICD-10-CM

## 2021-08-09 DIAGNOSIS — I509 Heart failure, unspecified: Secondary | ICD-10-CM

## 2021-08-09 DIAGNOSIS — I3489 Other nonrheumatic mitral valve disorders: Secondary | ICD-10-CM

## 2021-08-09 HISTORY — PX: TEE WITHOUT CARDIOVERSION: SHX5443

## 2021-08-09 LAB — BASIC METABOLIC PANEL
Anion gap: 8 (ref 5–15)
BUN: 14 mg/dL (ref 6–20)
CO2: 25 mmol/L (ref 22–32)
Calcium: 9.4 mg/dL (ref 8.9–10.3)
Chloride: 104 mmol/L (ref 98–111)
Creatinine, Ser: 1.01 mg/dL (ref 0.61–1.24)
GFR, Estimated: >60 mL/min (ref >60)
Glucose, Bld: 85 mg/dL (ref 70–99)
Potassium: 4.1 mmol/L (ref 3.5–5.1)
Sodium: 137 mmol/L (ref 135–145)

## 2021-08-09 LAB — CBC
HCT: 50.9 % (ref 39.0–52.0)
Hemoglobin: 16.7 g/dL (ref 13.0–17.0)
MCH: 27.6 pg (ref 26.0–34.0)
MCHC: 32.8 g/dL (ref 30.0–36.0)
MCV: 84.1 fL (ref 80.0–100.0)
Platelets: 170 10*3/uL (ref 150–400)
RBC: 6.05 MIL/uL — ABNORMAL HIGH (ref 4.22–5.81)
RDW: 19.1 % — ABNORMAL HIGH (ref 11.5–15.5)
WBC: 11 10*3/uL — ABNORMAL HIGH (ref 4.0–10.5)
nRBC: 0 % (ref 0.0–0.2)

## 2021-08-09 LAB — HEPARIN LEVEL (UNFRACTIONATED)
Heparin Unfractionated: 0.14 IU/mL — ABNORMAL LOW (ref 0.30–0.70)
Heparin Unfractionated: 0.2 IU/mL — ABNORMAL LOW (ref 0.30–0.70)

## 2021-08-09 LAB — COOXEMETRY PANEL
Carboxyhemoglobin: 1.5 % (ref 0.5–1.5)
Methemoglobin: <0.7 % (ref 0.0–1.5)
O2 Saturation: 72.8 %
Total hemoglobin: 17 g/dL — ABNORMAL HIGH (ref 12.0–16.0)

## 2021-08-09 LAB — PROTIME-INR
INR: 1.1 (ref 0.8–1.2)
Prothrombin Time: 14.1 seconds (ref 11.4–15.2)

## 2021-08-09 SURGERY — ECHOCARDIOGRAM, TRANSESOPHAGEAL
Anesthesia: Monitor Anesthesia Care

## 2021-08-09 MED ORDER — WARFARIN SODIUM 5 MG PO TABS
5.0000 mg | ORAL_TABLET | Freq: Once | ORAL | Status: AC
  Administered 2021-08-09: 5 mg via ORAL
  Filled 2021-08-09: qty 1

## 2021-08-09 MED ORDER — PHENYLEPHRINE 80 MCG/ML (10ML) SYRINGE FOR IV PUSH (FOR BLOOD PRESSURE SUPPORT)
PREFILLED_SYRINGE | INTRAVENOUS | Status: DC | PRN
  Administered 2021-08-09: 80 ug via INTRAVENOUS
  Administered 2021-08-09 (×2): 160 ug via INTRAVENOUS
  Administered 2021-08-09: 80 ug via INTRAVENOUS

## 2021-08-09 MED ORDER — SODIUM CHLORIDE 0.9 % IV SOLN: INTRAVENOUS | Status: DC

## 2021-08-09 MED ORDER — PROPOFOL 500 MG/50ML IV EMUL
INTRAVENOUS | Status: DC | PRN
  Administered 2021-08-09: 50 mg via INTRAVENOUS
  Administered 2021-08-09: 100 ug/kg/min via INTRAVENOUS
  Administered 2021-08-09: 50 mg via INTRAVENOUS

## 2021-08-09 NOTE — Discharge Summary (Signed)
Advanced Heart Failure Team  Discharge Summary   Patient ID: Mark Garrett MRN: [id number], DOB/AGE: 1980-05-24 41 y.o. 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 with history of CAD s/p CABG in 2006 in [place], mechanical mitral valve replacement, chronic systolic CHF, and PAD returns for followup of CHF. Patient had crush injury to left foot in 2004, lost the foot.  Has a prosthesis.  He had CABG in 2006 (said "they were unable to do a stent").  Patient appears to have been lost to medical followup until 10/22, was not on any meds.  He was then seen at [facility] in 10/22 with bilateral DVTs and started on Xarelto.  Echo there was reported as showing EF 55-60% with inferior hypokinesis and severe MR.  He was then admitted at [facility] in 11/22 with chest pain, leg swelling, cough, hypoxemia.  Repeat echo showed EF 35-40%, severe hypokinesis inferior and inferolateral walls, moderate LV dilation with mild LVH, mild RV dilation with moderately decreased systolic function, restricted posterior mitral leaflet and calcified mitral valve with severe mitral regurgitation and at least mild mitral stenosis (mean gradient 8 mmHg), PASP 60.  TEE was done and appeared to show vegetation on the mitral leaflets, suggesting endocarditis may be the major cause of severe MR.  Reviewed with ID, suspect the mitral vegetations had been present for a while (severe MR on 10/22 echo as well, did not appear to have active infection).     On 02/17/21, patient had placement of mechanical On-X mitral valve with CABG x 1 (SVG-ramus). Extensive vegetation noted on excised native  valve.  Post-op echo in 12/22 showed EF 25-30% with moderately decreased RV function and normally functioning mechanical valve. On 12/6, patient had code stroke for right-sided weakness.  MRI Brain demonstrated a small R frontal white matter stroke which appeared subacute and did not explain his R sided symptoms, also mid brain infarct vs artifact. Patient gradually improved and was discharged home on warfarin to be followed in coumadin clinic.    Seen in clinic on 5/16. Echo was done showing EF 30% with moderate LVH, mildly decreased RV systolic function, PASP 59 mmHg, mechanical mitral valve with elevated mean gradient 17 mmHg and MVA 0.66 cm^2 by VTI suggesting prosthetic valve obstruction/stenosis. 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  Mean gradient 12 mmHg, MVA 0.73 cm^2 by VTI.  Minimal mitral regurgitation (physiological for valve). Normal caliber thoracic aorta.    He was directed admitted to the ICU for further management and treatment w/ TPA. Central access was obtained and he was started on alteplase gtt. He was monitored w/ serial TTEs which showed interval improvement in mean gradient and reduction in thrombus size. After 48 hrs of alteplase gtt was transitioned back to warfarin w/ heparin bridge on 5/22. Had repeat TEE on 5/23 that showed mechanical mitral valve much improved with no restriction from thrombus, mean gradient 2 mmHg.    He was continued on heparin gtt until INR reached therapeutic range. Goal INR (  2.5-3.5). He remained stable from CHF standpoint. Co-ox and CVP monitored closely.   Close Coumadin Clinic f/u has been arranged in [place] on 5/30 and f/u in the [facility] arranged for 6/1. SW consulted to assist w/ help w/ transportation to and from appointments.   Medications delivered through [place].   INR today 2.9 within goal range of 2.5 to 3.5  Continue Farxiga 10 mg daily.  - Continue spironolactone 25 mg daily.  - Continue Entresto 49/51 bid  - Continue Digoxin 0.125, level ok today.   - Continue Toprol XL 25 mg daily.  -ASA 81, crestor 40  He has been instructed to stop smoking.  Antibiotic prophylaxis with dental work.  - Think current issue is valve thrombosis, not endocarditis, but sent blood cultures (NGTD).   Please note I took over discharge summary on day of discharge.    Discharge Weight Range: 173 lb Discharge Vitals: Blood pressure 114/62, pulse 78, temperature 97.9 F (36.6 C), temperature source Oral, resp. rate 16, height 5\' 11"  (1.803 m), weight 77.9 kg, SpO2 99 %.  Labs: Lab Results  Component Value Date   WBC 10.2 08/13/2021   HGB 18.2 (H) 08/13/2021   HCT 57.8 (H) 08/13/2021   MCV 85.8 08/13/2021   PLT 222 08/13/2021    Recent Labs  Lab 08/13/21 0056  NA 135  K 4.4  CL 103  CO2 20*  BUN 24*  CREATININE 1.18  CALCIUM 9.5  GLUCOSE 76   Lab Results  Component Value Date   CHOL 111 06/21/2021   HDL 38 (L) 06/21/2021   LDLCALC 64 06/21/2021   TRIG 47 06/21/2021   BNP (last 3 results) Recent Labs    01/27/21 1034  BNP 1,733.0*    ProBNP (last 3 results) No results for input(s): PROBNP in the last 8760 hours.  Diagnostic Studies/Procedures   08/05/21 TEE IMPRESSIONS     1. Left ventricular ejection fraction, by estimation, is 35 to 40%. The  left ventricle has mildly decreased function. The left ventricle  demonstrates regional wall motion abnormalities with basal to mid inferior  akinesis.   2. There was a mechanical On-X mitral valve. There was extensive  thrombosis of the mechanical mitral valve with partial obstruction of the  leaflets (leaflets still mobile though restricted). Mean gradient 12 mmHg,  MVA 0.73 cm^2 by VTI. Minimal mitral  regurgitation (physiological for valve).   3. Right ventricular systolic  function is moderately reduced. The right  ventricular size is normal.   4. Left atrial size was moderately dilated. No left atrial/left atrial  appendage thrombus was detected.   5. Right atrial size was mildly dilated.   6. No PFO or ASD by color doppler.   7. The aortic valve is tricuspid. Aortic valve regurgitation is not  visualized. No aortic stenosis is present.   8. Normal caliber thoracic aorta.   FINDINGS   Left Ventricle: Left ventricular ejection fraction, by estimation, is 35  to 40%. The left ventricle has mildly decreased function. The left  ventricle demonstrates regional wall motion abnormalities. The left  ventricular internal cavity size was normal  in size.   Right Ventricle: The right ventricular size is normal. No increase in  right ventricular wall thickness. Right ventricular systolic function is  moderately reduced.   Left Atrium: Left atrial size was moderately dilated. No left atrial/left  atrial appendage thrombus was detected.   Right Atrium: Right atrial size was mildly dilated.   Pericardium: There is no  evidence of pericardial effusion.   Mitral Valve: There was a mechanical On-X mitral valve. There was  extensive thrombosis of the mechanical mitral valve with partial  obstruction of the leaflets (leaflets still mobile though restricted).  Mean gradient 12 mmHg, MVA 0.73 cm^2 by VTI. Minimal   mitral regurgitation (physiological for valve). The mitral valve has been  repaired/replaced. Trivial mitral valve regurgitation. There is a present  in the mitral position. MV peak gradient, 18.8 mmHg. The mean mitral valve  gradient is 11.5 mmHg.   Tricuspid Valve: The tricuspid valve is normal in structure. Tricuspid  valve regurgitation is trivial.   Aortic Valve: The aortic valve is tricuspid. Aortic valve regurgitation is  not visualized. No aortic stenosis is present.   Pulmonic Valve: The pulmonic valve was normal in structure. Pulmonic valve   regurgitation is trivial.   Aorta: Normal caliber thoracic aorta. The aortic root is normal in size  and structure.   IAS/Shunts: No PFO or ASD by color doppler.      LEFT VENTRICLE  PLAX 2D  LVOT diam:     2.00 cm  LV SV:         82  LV SV Index:   40  LVOT Area:     3.14 cm      AORTIC VALVE  LVOT Vmax:   136.00 cm/s  LVOT Vmean:  84.100 cm/s  LVOT VTI:    0.260 m   MITRAL VALVE  MV Area VTI:  0.72 cm    SHUNTS  MV Peak grad: 18.8 mmHg   Systemic VTI:  0.26 m  MV Mean grad: 11.5 mmHg   Systemic Diam: 2.00 cm  MV Vmax:      2.17 m/s  MV Vmean:     165.0 cm/s   Limited TTE 08/06/21  There is a ON-X 27/29 MM PROSTHETIC MITRAL VALVE mechanical valve  present in the mitral position. The mean mitral valve gradient is 6.0 mmHg  with average heart rate of 64 bpm.      MG significantly improved from prior TTE (MG 50mmHg at 80bpm on  08/02/21). Mobile echodensity consistent with thrombus seen on atrial side  of valve measuring 2.5cmx1.2cm   Limited Echo 08/07/21 The mitral valve has been repaired/replaced. The mean mitral valve  gradient is 4.0 mmHg with average heart rate of 66 bpm. There is a ON-X  27-29 present in the mitral position. Echo findings are consistent with  normal structure and function of the  mitral valve prosthesis.   Limited Echo 08/08/21 IMPRESSIONS     1. Limited study to assess mechanical MVR.   2. The mitral valve has been repaired/replaced. There is a ON-X 27-29  present in the mitral position. Mean gradient improved to 39mmHg at HR  64bpm. Leaflet excursion appears improved from prior studies (best seen on  PLAX). There continues to be a mobile  echodensity on the atrial side of the leaflets. Difficult to measure but  based on limited views, appears to be slightly smaller from previous  (~2.3cmx1.2cm).   3. Left ventricular ejection fraction, by estimation, is 30 to 35%. The  left ventricle has moderately decreased function.   4. The inferior  vena cava is normal in size with greater than 50%  respiratory variability, suggesting right atrial pressure of 3 mmHg.   Comparison(s): Compared to prior study on 08/07/21, the mean mitral  gradient is now 61mmHg (previously 45mmHg). Similar to study on 08/06/21,  there continues to be an echodensity  on the atrial side of the mitral  valve leaflets.   Discharge Medications   Allergies as of 08/13/2021       Reactions   Chlorhexidine    Iodide Rash   redness        Medication List     TAKE these medications    acetaminophen 325 MG tablet Commonly known as: TYLENOL Take 2 tablets (650 mg total) by mouth every 4 (four) hours as needed for headache or mild pain.   aspirin 81 MG chewable tablet Chew 1 tablet (81 mg total) by mouth daily.   digoxin 0.125 MG tablet Commonly known as: LANOXIN Take 1 tablet by mouth daily.   Entresto 49-51 MG Generic drug: sacubitril-valsartan Take 1 tablet by mouth 2 (two) times daily.   Farxiga 10 MG Tabs tablet Generic drug: dapagliflozin propanediol Take 1 tablet by mouth daily.   metoprolol succinate 25 MG 24 hr tablet Commonly known as: TOPROL-XL Take 1/2 tablet by mouth daily. What changed: Another medication with the same name was added. Make sure you understand how and when to take each.   metoprolol succinate 25 MG 24 hr tablet Commonly known as: TOPROL-XL Take 1 tablet (25 mg total) by mouth daily. What changed: You were already taking a medication with the same name, and this prescription was added. Make sure you understand how and when to take each.   rosuvastatin 40 MG tablet Commonly known as: CRESTOR Take 1 tablet by mouth daily.   sildenafil 20 MG tablet Commonly known as: REVATIO Take 2 tablets by mouth 3 times daily.   spironolactone 25 MG tablet Commonly known as: ALDACTONE Take 1 tablet by mouth daily.   warfarin 4 MG tablet Commonly known as: COUMADIN Take as directed. If you are unsure how to take this  medication, talk to your nurse or doctor. Original instructions: Take 1 tablet (4 mg total) by mouth daily at 4 PM. What changed:  medication strength how much to take how to take this when to take this additional instructions        Disposition   The patient will be discharged in stable condition to home.   Follow-up Information     [facility] Follow up.   Specialty: Cardiology Why: Coumadin Clinic in Center to Check INR  08/16/21 at 3:00 PM Contact information: [place] [phone]        [facility] Follow up.   Specialty: Cardiology Why: 08/18/21 at 2:00 PM  [facility] at [facility] (Dr. Claris Gladden office) Contact information: [address] [id number] [place] low salt diet  Very important to take medications as instructed. It will help prolong your life.   Please keep your follow up appointments.  Stop smoking please  Duration of Discharge Encounter: Greater than 35 minutes   Signed, Cecilie Kicks, NP-C  08/13/2021, 10:49 AM

## 2021-08-09 NOTE — Progress Notes (Addendum)
ANTICOAGULATION CONSULT NOTE - ConsultPharmacy Consult for TPA > heparin + warfarin  Indication: mechanical mitral valve thrombus  Allergies  Allergen Reactions   Iodide Rash    redness    Patient Measurements: Height: 5\' 11"  (180.3 cm) Weight: 75.6 kg (166 lb 10.7 oz) IBW/kg (Calculated) : 75.3   Vital Signs: Temp: 97.8 F (36.6 C) (05/23 0300) Temp Source: Oral (05/23 0300) BP: 149/86 (05/23 0600) Pulse Rate: 66 (05/23 0700)  Labs: Recent Labs    08/07/21 0514 08/08/21 0400 08/08/21 2219 08/09/21 0543  HGB 15.9 16.3  --  16.7  HCT 50.5 49.1  --  50.9  PLT 153 157  --  170  LABPROT 15.3* 14.5  --   --   INR 1.2 1.1  --   --   HEPARINUNFRC  --   --  <0.10*  --   CREATININE 1.09 1.10  --  1.01     Estimated Creatinine Clearance: 100.4 mL/min (by C-G formula based on SCr of 1.01 mg/dL).   Medical History: Past Medical History:  Diagnosis Date   COPD (chronic obstructive pulmonary disease) ([place])    DVT (deep venous thrombosis) ([facility])     Assessment: 41yom with mechanical mitral valve admitted with valve thrombus and INR < 2  - med non-compliance.   Started on tpa 1mg /hr per [facility] paper- in shadow chart.  Transitioned to heparin/warfarin 5/22. Heparin level still low this morning. INR normal at 1.1. CBC stable overnight, no bleeding issues noted. TEE this morning showed minimal residual thrombus and no restriction by mean gradient of 60mmHg.   Goal of Therapy:  INR goal 2.5-3.5 Heparin level 0.3-0.7 units/ml Monitor platelets by anticoagulation protocol: Yes   Plan:  Increase heparin drip to 1200 units/hr Heparin level 6hr after rate change Warfarin 5mg  again tonight Daily CBC protime and heparin level Monitor s/s bleeding   Erin Hearing PharmD., BCPS Clinical Pharmacist 08/09/2021 7:23 AM

## 2021-08-09 NOTE — Progress Notes (Signed)
  Echocardiogram Echocardiogram Transesophageal has been performed.  Gerda Diss 08/09/2021, 8:04 AM

## 2021-08-09 NOTE — Anesthesia Postprocedure Evaluation (Signed)
Anesthesia Post Note  Patient: Mark Garrett [facility]  Procedure(s) Performed: TRANSESOPHAGEAL ECHOCARDIOGRAM (TEE)     Patient location during evaluation: Endoscopy Anesthesia Type: MAC Level of consciousness: awake and alert Pain management: pain level controlled Vital Signs Assessment: post-procedure vital signs reviewed and stable Respiratory status: spontaneous breathing, nonlabored ventilation, respiratory function stable and patient connected to nasal cannula oxygen Cardiovascular status: stable and blood pressure returned to baseline Postop Assessment: no apparent nausea or vomiting Anesthetic complications: no   No notable events documented.  Last Vitals:  Vitals:   08/09/21 1030 08/09/21 1045  BP:    Pulse: 66 74  Resp: 15 17  Temp:    SpO2: 94% 94%    Last Pain:  Vitals:   08/09/21 1000  TempSrc:   PainSc: 0-No pain                 Belenda Cruise P Zarinah Oviatt

## 2021-08-09 NOTE — Progress Notes (Signed)
ANTICOAGULATION CONSULT NOTE - ConsultPharmacy Consult for TPA > heparin + warfarin  Indication: mechanical mitral valve thrombus  Allergies  Allergen Reactions   Iodide Rash    redness    Patient Measurements: Height: 5\' 11"  (180.3 cm) Weight: 80.3 kg (177 lb) IBW/kg (Calculated) : 75.3   Vital Signs: Temp: 98.4 F (36.9 C) (05/23 1943) Temp Source: Oral (05/23 1943) BP: 110/78 (05/23 1943) Pulse Rate: 72 (05/23 1943)  Labs: Recent Labs    08/07/21 0514 08/08/21 0400 08/08/21 2219 08/09/21 0543 08/09/21 1133 08/09/21 2020  HGB 15.9 16.3  --  16.7  --   --   HCT 50.5 49.1  --  50.9  --   --   PLT 153 157  --  170  --   --   LABPROT 15.3* 14.5  --   --  14.1  --   INR 1.2 1.1  --   --  1.1  --   HEPARINUNFRC  --   --  <0.10*  --  0.14* 0.20*  CREATININE 1.09 1.10  --  1.01  --   --      Estimated Creatinine Clearance: 100.4 mL/min (by C-G formula based on SCr of 1.01 mg/dL).   Medical History: Past Medical History:  Diagnosis Date   COPD (chronic obstructive pulmonary disease) ([place])    DVT (deep venous thrombosis) ([facility])     Assessment: 41yom with mechanical mitral valve admitted with valve thrombus and INR < 2  - med non-compliance.   Started on tpa 1mg /hr per [facility] paper- in shadow chart.  Transitioned to heparin/warfarin 5/22. Heparin level 0.2 (on heparin 1200 units/hr) No new signs/symptoms of bleed per nurse  Goal of Therapy:  INR goal 2.5-3.5 Heparin level 0.3-0.7 units/ml Monitor platelets by anticoagulation protocol: Yes   Plan:  Increase heparin drip to 1350 units/hr Heparin level with am labs Warfarin 5mg  given tonight Daily CBC protime and heparin level Monitor s/s bleeding   Thank you for allowing pharmacy to be a part of this patient's care.  Donnald Garre, PharmD Clinical Pharmacist  Please check AMION for all [place] numbers After 10:00 PM, call [place] [phone]

## 2021-08-09 NOTE — Progress Notes (Signed)
Report received. Pt taken by wheelchair for TEE.

## 2021-08-09 NOTE — Transfer of Care (Signed)
Immediate Anesthesia Transfer of Care Note  Patient: Mark Garrett  Procedure(s) Performed: TRANSESOPHAGEAL ECHOCARDIOGRAM (TEE)  Patient Location: PACU  Anesthesia Type:MAC  Level of Consciousness: drowsy  Airway & Oxygen Therapy: Patient Spontanous Breathing  Post-op Assessment: Report given to RN and Post -op Vital signs reviewed and stable  Post vital signs: Reviewed and stable  Last Vitals:  Vitals Value Taken Time  BP    Temp    Pulse 56 08/09/21 0800  Resp 14 08/09/21 0800  SpO2 96 % 08/09/21 0800  Vitals shown include unvalidated device data.  Last Pain:  Vitals:   08/09/21 0715  TempSrc: Oral  PainSc: 0-No pain      Patients Stated Pain Goal: 0 (09/17/14 0109)  Complications: No notable events documented.
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
## 2021-08-09 NOTE — Plan of Care (Signed)
  Problem: Activity: Goal: Capacity to carry out activities will improve Outcome: Progressing   Problem: Education: Goal: Knowledge of General Education information will improve Description: Including pain rating scale, medication(s)/side effects and non-pharmacologic comfort measures Outcome: Progressing   Problem: Health Behavior/Discharge Planning: Goal: Ability to manage health-related needs will improve Outcome: Progressing   Problem: Clinical Measurements: Goal: Will remain free from infection Outcome: Progressing Goal: Respiratory complications will improve Outcome: Progressing   Problem: Activity: Goal: Risk for activity intolerance will decrease Outcome: Progressing   Problem: Nutrition: Goal: Adequate nutrition will be maintained Outcome: Progressing   Problem: Coping: Goal: Level of anxiety will decrease Outcome: Progressing   Problem: Elimination: Goal: Will not experience complications related to bowel motility Outcome: Progressing   Problem: Pain Managment: Goal: General experience of comfort will improve Outcome: Progressing   Problem: Safety: Goal: Ability to remain free from injury will improve Outcome: Progressing   Problem: Skin Integrity: Goal: Risk for impaired skin integrity will decrease Outcome: Progressing

## 2021-08-09 NOTE — CV Procedure (Signed)
Procedure: TEE  Sedation: Per anesthesiology  Indication: Thrombosed mechanical mitral valve.   Findings: Please see echo section for full report.  Normal LV size with mild LV hypertrophy.  EF 35-40% with inferior akinesis. Normal RV size with moderately decreased systolic function.  The left atrium was moderately dilated, no LA appendage thrombus. The RA was mildly dilated.  No PFO or ASD by color doppler.  Trileaflet aortic valve with no stenosis and trivial regurgitation. Trivial TR.  There was a mechanical On-X mitral valve.  This looked much improved with minimal residual thrombus and mean gradient 2 mmHg (no restriction).  Minimal mitral regurgitation (physiological for valve).  Normal caliber thoracic aorta.   Mark Garrett 08/09/2021 8:09 AM

## 2021-08-09 NOTE — Interval H&P Note (Signed)
History and Physical Interval Note:  08/09/2021 7:44 AM  Mark Garrett  has presented today for surgery, with the diagnosis of mechanical mitrial valve thrombosis.  The various methods of treatment have been discussed with the patient and family. After consideration of risks, benefits and other options for treatment, the patient has consented to  Procedure(s): TRANSESOPHAGEAL ECHOCARDIOGRAM (TEE) (N/A) as a surgical intervention.  The patient's history has been reviewed, patient examined, no change in status, stable for surgery.  I have reviewed the patient's chart and labs.  Questions were answered to the patient's satisfaction.     Torence Palmeri [employer]

## 2021-08-10 ENCOUNTER — Encounter (HOSPITAL_COMMUNITY): Payer: Self-pay | Admitting: Cardiology

## 2021-08-10 DIAGNOSIS — I5022 Chronic systolic (congestive) heart failure: Secondary | ICD-10-CM

## 2021-08-10 LAB — BASIC METABOLIC PANEL
Anion gap: 8 (ref 5–15)
BUN: 18 mg/dL (ref 6–20)
CO2: 23 mmol/L (ref 22–32)
Calcium: 9.3 mg/dL (ref 8.9–10.3)
Chloride: 105 mmol/L (ref 98–111)
Creatinine, Ser: 1.04 mg/dL (ref 0.61–1.24)
GFR, Estimated: >60 mL/min (ref >60)
Glucose, Bld: 91 mg/dL (ref 70–99)
Potassium: 4.1 mmol/L (ref 3.5–5.1)
Sodium: 136 mmol/L (ref 135–145)

## 2021-08-10 LAB — CULTURE, BLOOD (ROUTINE X 2)
Culture: NO GROWTH
Culture: NO GROWTH
Special Requests: ADEQUATE

## 2021-08-10 LAB — PROTIME-INR
INR: 1.3 — ABNORMAL HIGH (ref 0.8–1.2)
Prothrombin Time: 16.1 seconds — ABNORMAL HIGH (ref 11.4–15.2)

## 2021-08-10 LAB — HEPARIN LEVEL (UNFRACTIONATED): Heparin Unfractionated: 0.34 IU/mL (ref 0.30–0.70)

## 2021-08-10 LAB — CBC
HCT: 50.6 % (ref 39.0–52.0)
Hemoglobin: 16 g/dL (ref 13.0–17.0)
MCH: 27.1 pg (ref 26.0–34.0)
MCHC: 31.6 g/dL (ref 30.0–36.0)
MCV: 85.6 fL (ref 80.0–100.0)
Platelets: 175 10*3/uL (ref 150–400)
RBC: 5.91 MIL/uL — ABNORMAL HIGH (ref 4.22–5.81)
RDW: 19 % — ABNORMAL HIGH (ref 11.5–15.5)
WBC: 11 10*3/uL — ABNORMAL HIGH (ref 4.0–10.5)
nRBC: 0 % (ref 0.0–0.2)

## 2021-08-10 LAB — COOXEMETRY PANEL
Carboxyhemoglobin: 1.4 % (ref 0.5–1.5)
Methemoglobin: 0.8 % (ref 0.0–1.5)
O2 Saturation: 71.4 %
Total hemoglobin: 16.6 g/dL — ABNORMAL HIGH (ref 12.0–16.0)

## 2021-08-10 MED ORDER — WARFARIN SODIUM 7.5 MG PO TABS
7.5000 mg | ORAL_TABLET | Freq: Once | ORAL | Status: AC
  Administered 2021-08-10: 7.5 mg via ORAL
  Filled 2021-08-10: qty 1

## 2021-08-10 MED ORDER — METOPROLOL SUCCINATE ER 25 MG PO TB24
25.0000 mg | ORAL_TABLET | Freq: Every day | ORAL | Status: DC
  Administered 2021-08-10 – 2021-08-13 (×3): 25 mg via ORAL
  Filled 2021-08-10 (×4): qty 1

## 2021-08-10 NOTE — Progress Notes (Signed)
Mobility Specialist Progress Note    08/10/21 1101  Mobility  Activity Ambulated independently in hallway  Level of Assistance Independent  Assistive Device None  Distance Ambulated (ft) 420 ft  Activity Response Tolerated well  $Mobility charge 1 Mobility   Pre-Mobility: 85 HR Post-Mobility: 84 HR  Pt received in bed and agreeable. No complaints on walk. Returned to sitting EOB with call bell in reach and NT present.   [place] Mobility Specialist  Primary: 5N M.S. Phone: [phone] Secondary: 6N M.S. Phone: [phone]

## 2021-08-10 NOTE — Progress Notes (Signed)
ANTICOAGULATION CONSULT NOTE - ConsultPharmacy Consult for TPA > heparin + warfarin  Indication: mechanical mitral valve thrombus  Allergies  Allergen Reactions   Iodide Rash    redness    Patient Measurements: Height: 5\' 11"  (180.3 cm) Weight: 79.5 kg (175 lb 4.3 oz) IBW/kg (Calculated) : 75.3   Vital Signs: Temp: 98.1 F (36.7 C) (05/24 0743) Temp Source: Oral (05/24 0743) BP: 124/83 (05/24 0743) Pulse Rate: 68 (05/24 0743)  Labs: Recent Labs    08/08/21 0400 08/08/21 2219 08/09/21 0543 08/09/21 1133 08/09/21 2020 08/10/21 0437  HGB 16.3  --  16.7  --   --  16.0  HCT 49.1  --  50.9  --   --  50.6  PLT 157  --  170  --   --  175  LABPROT 14.5  --   --  14.1  --  16.1*  INR 1.1  --   --  1.1  --  1.3*  HEPARINUNFRC  --    < >  --  0.14* 0.20* 0.34  CREATININE 1.10  --  1.01  --   --  1.04   < > = values in this interval not displayed.     Estimated Creatinine Clearance: 97.5 mL/min (by C-G formula based on SCr of 1.04 mg/dL).   Medical History: Past Medical History:  Diagnosis Date   COPD (chronic obstructive pulmonary disease) ([place])    DVT (deep venous thrombosis) ([facility])     Assessment: 41yom with mechanical mitral valve admitted with valve thrombus and INR < 2  - med non-compliance.   Started on tpa 1mg /hr per [facility] paper- in shadow chart.  Transitioned to heparin/warfarin 5/22. Heparin level 0.34 at goal on heparin drip rate 1350 uts/hr  Warfarin restarted 5/22 INR trend up 1.3  Per TEE 5/23  thrombus size reduced No new signs/symptoms of bleed per nurse  Goal of Therapy:  INR goal 2.5-3.5 Heparin level 0.3-0.7 units/ml Monitor platelets by anticoagulation protocol: Yes   Plan:  Continue heparin drip to 1350 units/hr Warfarin 7.5mg  x1 tonight Daily CBC protime and heparin level Monitor s/s bleeding    Bonnita Nasuti Pharm.D. CPP, BCPS Clinical Pharmacist [phone] 08/10/2021 12:10 PM    Please check AMION for all [place]  numbers After 10:00 PM, call [place] [phone]
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
## 2021-08-10 NOTE — Plan of Care (Signed)
  Problem: Education: Goal: Ability to demonstrate management of disease process will improve Outcome: Progressing Goal: Ability to verbalize understanding of medication therapies will improve Outcome: Progressing Goal: Individualized Educational Video(s) Outcome: Progressing   Problem: Activity: Goal: Capacity to carry out activities will improve Outcome: Progressing   Problem: Cardiac: Goal: Ability to achieve and maintain adequate cardiopulmonary perfusion will improve Outcome: Progressing   Problem: Education: Goal: Knowledge of General Education information will improve Description: Including pain rating scale, medication(s)/side effects and non-pharmacologic comfort measures Outcome: Progressing   Problem: Health Behavior/Discharge Planning: Goal: Ability to manage health-related needs will improve Outcome: Progressing   Problem: Clinical Measurements: Goal: Ability to maintain clinical measurements within normal limits will improve Outcome: Progressing Goal: Will remain free from infection Outcome: Progressing Goal: Diagnostic test results will improve Outcome: Progressing Goal: Respiratory complications will improve Outcome: Progressing Goal: Cardiovascular complication will be avoided Outcome: Progressing   Problem: Activity: Goal: Risk for activity intolerance will decrease Outcome: Progressing   Problem: Nutrition: Goal: Adequate nutrition will be maintained Outcome: Progressing   Problem: Coping: Goal: Level of anxiety will decrease Outcome: Progressing   Problem: Elimination: Goal: Will not experience complications related to bowel motility Outcome: Progressing   Problem: Pain Managment: Goal: General experience of comfort will improve Outcome: Progressing   Problem: Safety: Goal: Ability to remain free from injury will improve Outcome: Progressing   Problem: Skin Integrity: Goal: Risk for impaired skin integrity will decrease Outcome:  Progressing

## 2021-08-11 LAB — BASIC METABOLIC PANEL
Anion gap: 8 (ref 5–15)
BUN: 17 mg/dL (ref 6–20)
CO2: 25 mmol/L (ref 22–32)
Calcium: 9.5 mg/dL (ref 8.9–10.3)
Chloride: 105 mmol/L (ref 98–111)
Creatinine, Ser: 1.2 mg/dL (ref 0.61–1.24)
GFR, Estimated: >60 mL/min (ref >60)
Glucose, Bld: 102 mg/dL — ABNORMAL HIGH (ref 70–99)
Potassium: 4.5 mmol/L (ref 3.5–5.1)
Sodium: 138 mmol/L (ref 135–145)

## 2021-08-11 LAB — CBC
HCT: 51.9 % (ref 39.0–52.0)
Hemoglobin: 16.6 g/dL (ref 13.0–17.0)
MCH: 27.5 pg (ref 26.0–34.0)
MCHC: 32 g/dL (ref 30.0–36.0)
MCV: 86.1 fL (ref 80.0–100.0)
Platelets: 167 10*3/uL (ref 150–400)
RBC: 6.03 MIL/uL — ABNORMAL HIGH (ref 4.22–5.81)
RDW: 18.9 % — ABNORMAL HIGH (ref 11.5–15.5)
WBC: 9.4 10*3/uL (ref 4.0–10.5)
nRBC: 0 % (ref 0.0–0.2)

## 2021-08-11 LAB — PROTIME-INR
INR: 1.5 — ABNORMAL HIGH (ref 0.8–1.2)
Prothrombin Time: 18 seconds — ABNORMAL HIGH (ref 11.4–15.2)

## 2021-08-11 LAB — HEPARIN LEVEL (UNFRACTIONATED): Heparin Unfractionated: 0.35 IU/mL (ref 0.30–0.70)

## 2021-08-11 MED ORDER — WARFARIN SODIUM 7.5 MG PO TABS
7.5000 mg | ORAL_TABLET | Freq: Once | ORAL | Status: AC
  Administered 2021-08-11: 7.5 mg via ORAL
  Filled 2021-08-11: qty 1

## 2021-08-11 NOTE — Plan of Care (Signed)
  Problem: Education: Goal: Ability to demonstrate management of disease process will improve Outcome: Progressing Goal: Ability to verbalize understanding of medication therapies will improve Outcome: Progressing Goal: Individualized Educational Video(s) Outcome: Progressing   Problem: Activity: Goal: Capacity to carry out activities will improve Outcome: Progressing   Problem: Cardiac: Goal: Ability to achieve and maintain adequate cardiopulmonary perfusion will improve Outcome: Progressing   Problem: Education: Goal: Knowledge of General Education information will improve Description: Including pain rating scale, medication(s)/side effects and non-pharmacologic comfort measures Outcome: Progressing   Problem: Health Behavior/Discharge Planning: Goal: Ability to manage health-related needs will improve Outcome: Progressing   Problem: Clinical Measurements: Goal: Ability to maintain clinical measurements within normal limits will improve Outcome: Progressing Goal: Will remain free from infection Outcome: Progressing Goal: Diagnostic test results will improve Outcome: Progressing Goal: Respiratory complications will improve Outcome: Progressing Goal: Cardiovascular complication will be avoided Outcome: Progressing   Problem: Activity: Goal: Risk for activity intolerance will decrease Outcome: Progressing   Problem: Nutrition: Goal: Adequate nutrition will be maintained Outcome: Progressing   Problem: Coping: Goal: Level of anxiety will decrease Outcome: Progressing   Problem: Elimination: Goal: Will not experience complications related to bowel motility Outcome: Progressing   Problem: Pain Managment: Goal: General experience of comfort will improve Outcome: Progressing   Problem: Safety: Goal: Ability to remain free from injury will improve Outcome: Progressing   Problem: Skin Integrity: Goal: Risk for impaired skin integrity will decrease Outcome:  Progressing   

## 2021-08-11 NOTE — TOC CM/SW Note (Signed)
HF TOC CM spoke to pt. States he works full-time. Lives with roommates. He reports his friend will provide transportation home. Will need MATCH/HF Funds to pay for meds. [facility]uled [facility]t [facility] 08/19/2021 at 1:00 pm. Pt has been [facility] for Medicaid by HF CSW. Will need meds to come up from [facility] pharmacy.  CM put in [facility] letter.   Isidoro Donning RN3 CCM, Heart Failure TOC CM [phone]

## 2021-08-11 NOTE — Progress Notes (Signed)
ANTICOAGULATION CONSULT NOTE - ConsultPharmacy Consult for TPA > heparin + warfarin  Indication: mechanical mitral valve thrombus  Allergies  Allergen Reactions   Chlorhexidine    Iodide Rash    redness    Patient Measurements: Height: 5\' 11"  (180.3 cm) Weight: 78.3 kg (172 lb 9.9 oz) IBW/kg (Calculated) : 75.3   Vital Signs: Temp: 97.9 F (36.6 C) (05/25 0750) Temp Source: Oral (05/25 0750) BP: 102/70 (05/25 0750) Pulse Rate: 64 (05/25 0750)  Labs: Recent Labs    08/09/21 0543 08/09/21 1133 08/09/21 2020 08/10/21 0437 08/11/21 0108  HGB 16.7  --   --  16.0 16.6  HCT 50.9  --   --  50.6 51.9  PLT 170  --   --  175 167  LABPROT  --  14.1  --  16.1* 18.0*  INR  --  1.1  --  1.3* 1.5*  HEPARINUNFRC  --  0.14* 0.20* 0.34 0.35  CREATININE 1.01  --   --  1.04 1.20     Estimated Creatinine Clearance: 84.5 mL/min (by C-G formula based on SCr of 1.2 mg/dL).   Medical History: Past Medical History:  Diagnosis Date   COPD (chronic obstructive pulmonary disease) ([place])    DVT (deep venous thrombosis) ([facility])     Assessment: 41yom with mechanical mitral valve admitted with valve thrombus and INR < 2  - med non-compliance.   Started on tpa 1mg /hr per [facility] paper- in shadow chart.  Transitioned to heparin/warfarin 5/22. Heparin level 0.35 at goal on heparin drip rate 1350 uts/hr  Warfarin restarted 5/22 INR trend up 1.5  Per TEE 5/23  thrombus size reduced No new signs/symptoms of bleed per nurse  Goal of Therapy:  INR goal 2.5-3.5 Heparin level 0.3-0.7 units/ml Monitor platelets by anticoagulation protocol: Yes   Plan:  Continue heparin drip to 1350 units/hr Warfarin 7.5mg  x1 tonight - repeat  Daily CBC protime and heparin level Monitor s/s bleeding    Bonnita Nasuti Pharm.D. CPP, BCPS Clinical Pharmacist [phone] 08/11/2021 9:01 AM    Please check AMION for all [place] numbers After 10:00 PM, call [place] [phone]
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
## 2021-08-11 NOTE — Evaluation (Signed)
Physical Therapy Evaluation & Discharge Patient Details Name: Mark Garrett MRN: [id number] DOB: Jan 19, 1981 Today's Date: 08/11/2021  History of Present Illness  Pt is a 41 y.o. male who presented 08/05/21 for outpatient TEE which demonstrated extensive thrombosis of the mechnical mitral valve with partial obstruction of the leaflets and thus pt was admitted for further management and treatment with TPA. Repeat TEE 5/23 showing mechanical mitral valve much improved with no restriction from thrombus. 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 PTA, he was IND without DME, mobilizing with or without his L lower extremity prosthesis. 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 Currently, pt reports to be and appears to be functioning at his baseline, displaying gait deviations related to his prior L TMA and his L prosthesis causing the L leg to be longer than the R. He appears to have [facility] strength and balance and is able to mobilize safely without assistance or LOB. All education completed and questions answered. PT will sign off.       Recommendations for follow up therapy are one component of a multi-disciplinary discharge planning process, led by the attending physician.  Recommendations may be updated based on patient status, additional functional criteria and insurance authorization.  Follow Up Recommendations No PT follow up    Assistance Recommended at Discharge None  Patient can return home with the following   (N/A)    Equipment Recommendations None recommended by PT  Recommendations for Other Services       Functional Status Assessment Patient has not had a recent decline in their functional status     Precautions / Restrictions Precautions Precautions: Other  (comment) Precaution Comments: prior L TMA with proesthesis in room Restrictions Weight Bearing Restrictions: No      Mobility  Bed Mobility Overal bed mobility: Modified Independent             General bed mobility comments: Pt able to perform all bed mobility aspects without assistance, HOB elevated.    Transfers Overall transfer level: Independent Equipment used: None               General transfer comment: Pt able to come to stand safely without assistance from multiple surfaces.    Ambulation/Gait Ambulation/Gait assistance: Independent Gait Distance (Feet): 400 Feet (x3 bouts of ~10 ft > ~400 ft > ~10 ft) Assistive device: None Gait Pattern/deviations: Step-through pattern (increased L lateral trunk flexion in L stance) Gait velocity: WNL Gait velocity interpretation: >4.37 ft/sec, indicative of normal walking speed   General Gait Details: Pt with appropriate pace and no LOB, even with changing head position and directions. Pt with gait deviations associated with L prosthesis resulting in height difference between the 2 legs. Can ambulate safely without prosthesis also.  Stairs Stairs: Yes Stairs assistance: Modified independent (Device/Increase time) Stair Management: One rail Left, Alternating pattern, Forwards Number of Stairs: 3 General stair comments: Ascends and descends stairs with reciprocal pattern using 1 handrail, no LOB.  Wheelchair Mobility    Modified Rankin (Stroke Patients Only)       Balance Overall balance assessment: No apparent balance deficits (not formally assessed) (able to reach down to pick up objects off ground without LOB)  Pertinent Vitals/Pain Pain Assessment Pain Assessment: Faces Faces Pain Scale: No hurt Pain Intervention(s): Monitored during session    Home Living Family/patient expects to be discharged to:: Private residence Living Arrangements:  Non-relatives/Friends Available Help at Discharge: Friend(s);Available PRN/intermittently Type of Home: House Home Access: Stairs to enter Entrance Stairs-Rails: Chemical engineer of Steps: 2-3   Home Layout: One level;Laundry or work area in basement (R rails ascending from basement) Home Equipment: None Additional Comments: sleeps on couch    Prior Function Prior Level of Function : Independent/Modified Independent;Driving;Working/employed             Mobility Comments: No use of AD, wears prosthesis but can also mobilize without it 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        Extremity/Trunk Assessment   Upper Extremity Assessment Upper Extremity Assessment: Overall WFL for tasks assessed    Lower Extremity Assessment Lower Extremity Assessment: LLE deficits/detail LLE Deficits / Details: prior L TMA, prosthesis in room; appears WFL otherwise    Cervical / Trunk Assessment Cervical / Trunk Assessment: Normal  Communication   Communication: No difficulties  Cognition Arousal/Alertness: Awake/alert Behavior During Therapy: WFL for tasks assessed/performed Overall Cognitive Status: Within Functional Limits for tasks assessed                                          General Comments General comments (skin integrity, edema, etc.): Encouraged pt to mobilize with staff    Exercises     Assessment/Plan    PT Assessment Patient does not need any further PT services  PT Problem List         PT Treatment Interventions      PT Goals (Current goals can be found in the Care Plan section)  Acute Rehab PT Goals Patient Stated Goal: to go home PT Goal Formulation: All assessment and education complete, DC therapy Time For Goal Achievement: 08/12/21 Potential to Achieve Goals: Good    Frequency       Co-evaluation               AM-PAC PT "6 Clicks" Mobility  Outcome Measure Help needed turning from  your back to your side while in a flat bed without using bedrails?: None Help needed moving from lying on your back to sitting on the side of a flat bed without using bedrails?: None Help needed moving to and from a bed to a chair (including a wheelchair)?: None Help needed standing up from a chair using your arms (e.g., wheelchair or bedside chair)?: None Help needed to walk in hospital room?: None Help needed climbing 3-5 steps with a railing? : None 6 Click Score: 24    End of Session   Activity Tolerance: Patient tolerated treatment well Patient left: in bed;with call bell/phone within reach Nurse Communication: Mobility status PT Visit Diagnosis: Other abnormalities of gait and mobility (R26.89)    [id number] PT Time Calculation (min) (ACUTE ONLY): 12 min   Charges:   PT Evaluation $PT Eval Low Complexity: 1 Low          Moishe Spice, PT, DPT Acute Rehabilitation Services  Pager: [phone] Office: [place] 08/11/2021, 9:05 AM

## 2021-08-11 NOTE — Progress Notes (Signed)
Mobility Specialist Progress Note    08/11/21 1058  Mobility  Activity Ambulated independently in hallway  Level of Assistance Independent  Assistive Device None  Distance Ambulated (ft) 420 ft  Activity Response Tolerated well  $Mobility charge 1 Mobility   Pre-Mobility: 67 HR  Pt received in chair and agreeable. No complaints on walk. Returned to chair with call bell in reach.    [place] Mobility Specialist  Primary: 5N M.S. Phone: [phone] Secondary: 6N M.S. Phone: [phone]

## 2021-08-12 ENCOUNTER — Other Ambulatory Visit (HOSPITAL_COMMUNITY): Payer: Self-pay

## 2021-08-12 LAB — BASIC METABOLIC PANEL
Anion gap: 9 (ref 5–15)
BUN: 21 mg/dL — ABNORMAL HIGH (ref 6–20)
CO2: 21 mmol/L — ABNORMAL LOW (ref 22–32)
Calcium: 9.7 mg/dL (ref 8.9–10.3)
Chloride: 104 mmol/L (ref 98–111)
Creatinine, Ser: 1.13 mg/dL (ref 0.61–1.24)
GFR, Estimated: >60 mL/min (ref >60)
Glucose, Bld: 89 mg/dL (ref 70–99)
Potassium: 4.6 mmol/L (ref 3.5–5.1)
Sodium: 134 mmol/L — ABNORMAL LOW (ref 135–145)

## 2021-08-12 LAB — CBC
HCT: 53.1 % — ABNORMAL HIGH (ref 39.0–52.0)
Hemoglobin: 17.3 g/dL — ABNORMAL HIGH (ref 13.0–17.0)
MCH: 27.5 pg (ref 26.0–34.0)
MCHC: 32.6 g/dL (ref 30.0–36.0)
MCV: 84.6 fL (ref 80.0–100.0)
Platelets: 207 10*3/uL (ref 150–400)
RBC: 6.28 MIL/uL — ABNORMAL HIGH (ref 4.22–5.81)
RDW: 18.8 % — ABNORMAL HIGH (ref 11.5–15.5)
WBC: 10.1 10*3/uL (ref 4.0–10.5)
nRBC: 0 % (ref 0.0–0.2)

## 2021-08-12 LAB — HEPARIN LEVEL (UNFRACTIONATED): Heparin Unfractionated: 0.43 IU/mL (ref 0.30–0.70)

## 2021-08-12 LAB — DIGOXIN LEVEL: Digoxin Level: 0.5 ng/mL — ABNORMAL LOW (ref 0.8–2.0)

## 2021-08-12 LAB — PROTIME-INR
INR: 2 — ABNORMAL HIGH (ref 0.8–1.2)
Prothrombin Time: 22.4 seconds — ABNORMAL HIGH (ref 11.4–15.2)

## 2021-08-12 MED ORDER — WARFARIN SODIUM 5 MG PO TABS
5.0000 mg | ORAL_TABLET | Freq: Every day | ORAL | Status: DC
Start: 2021-08-12 — End: 2021-08-12

## 2021-08-12 MED ORDER — WARFARIN SODIUM 4 MG PO TABS
4.0000 mg | ORAL_TABLET | Freq: Every day | ORAL | Status: DC
  Administered 2021-08-12 – 2021-08-13 (×2): 4 mg via ORAL
  Filled 2021-08-12 (×2): qty 1

## 2021-08-12 NOTE — Progress Notes (Addendum)
ANTICOAGULATION CONSULT NOTE - ConsultPharmacy Consult for TPA > heparin + warfarin  Indication: mechanical mitral valve thrombus  Allergies  Allergen Reactions   Chlorhexidine    Iodide Rash    redness    Patient Measurements: Height: 5\' 11"  (180.3 cm) Weight: 78.7 kg (173 lb 8 oz) IBW/kg (Calculated) : 75.3   Vital Signs: Temp: 98 F (36.7 C) (05/26 0437) Temp Source: Oral (05/26 0437) BP: 110/73 (05/26 0437) Pulse Rate: 64 (05/26 0437)  Labs: Recent Labs    08/10/21 0437 08/11/21 0108 08/12/21 0051  HGB 16.0 16.6 17.3*  HCT 50.6 51.9 53.1*  PLT 175 167 207  LABPROT 16.1* 18.0* 22.4*  INR 1.3* 1.5* 2.0*  HEPARINUNFRC 0.34 0.35 0.43  CREATININE 1.04 1.20 1.13     Estimated Creatinine Clearance: 89.8 mL/min (by C-G formula based on SCr of 1.13 mg/dL).   Medical History: Past Medical History:  Diagnosis Date   COPD (chronic obstructive pulmonary disease) ([place])    DVT (deep venous thrombosis) ([facility])     Assessment: 41yom with mechanical mitral valve admitted with valve thrombus and INR < 2  - med non-compliance.   Started on tpa 1mg /hr per [facility] paper- in shadow chart.  Transitioned to heparin/warfarin 5/22. Heparin level 0.43 at goal on heparin drip rate 1350 uts/hr  Warfarin restarted 5/22 INR trend up 2 Per TEE 5/23  thrombus size reduced No new signs/symptoms of bleed per nurse  Goal of Therapy:  INR goal 2.5-3.5 Heparin level 0.3-0.7 units/ml Monitor platelets by anticoagulation protocol: Yes   Plan:  Continue heparin drip to 1350 units/hr Warfarin 4mg  daily  He has warfarin 2mg  tabs at home will adjust doses with that in mind Daily CBC protime and heparin level Monitor s/s bleeding    Bonnita Nasuti Pharm.D. CPP, BCPS Clinical Pharmacist [phone] 08/12/2021 7:50 AM    Please check AMION for all [place] numbers After 10:00 PM, call [place] [phone]
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
## 2021-08-12 NOTE — Progress Notes (Signed)
Mobility Specialist Progress Note    08/12/21 1603  Mobility  Activity Contraindicated/medical hold   Pt stated he just puked. RN notified.   [place] Mobility Specialist  Primary: 5N M.S. Phone: [phone] Secondary: 6N M.S. Phone: [phone]
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
## 2021-08-12 NOTE — Plan of Care (Signed)
  Problem: Education: Goal: Ability to demonstrate management of disease process will improve Outcome: Progressing Goal: Ability to verbalize understanding of medication therapies will improve Outcome: Progressing Goal: Individualized Educational Video(s) Outcome: Progressing   Problem: Activity: Goal: Capacity to carry out activities will improve Outcome: Progressing   Problem: Cardiac: Goal: Ability to achieve and maintain adequate cardiopulmonary perfusion will improve Outcome: Progressing   Problem: Education: Goal: Knowledge of General Education information will improve Description: Including pain rating scale, medication(s)/side effects and non-pharmacologic comfort measures Outcome: Progressing   Problem: Health Behavior/Discharge Planning: Goal: Ability to manage health-related needs will improve Outcome: Progressing   Problem: Clinical Measurements: Goal: Ability to maintain clinical measurements within normal limits will improve Outcome: Progressing Goal: Will remain free from infection Outcome: Progressing Goal: Diagnostic test results will improve Outcome: Progressing Goal: Respiratory complications will improve Outcome: Progressing Goal: Cardiovascular complication will be avoided Outcome: Progressing   Problem: Activity: Goal: Risk for activity intolerance will decrease Outcome: Progressing   Problem: Nutrition: Goal: Adequate nutrition will be maintained Outcome: Progressing   Problem: Coping: Goal: Level of anxiety will decrease Outcome: Progressing   Problem: Elimination: Goal: Will not experience complications related to bowel motility Outcome: Progressing   Problem: Pain Managment: Goal: General experience of comfort will improve Outcome: Progressing   Problem: Safety: Goal: Ability to remain free from injury will improve Outcome: Progressing   Problem: Skin Integrity: Goal: Risk for impaired skin integrity will decrease Outcome:  Progressing   

## 2021-08-13 ENCOUNTER — Other Ambulatory Visit (HOSPITAL_COMMUNITY): Payer: Self-pay

## 2021-08-13 DIAGNOSIS — T8209XA Other mechanical complication of heart valve prosthesis, initial encounter: Secondary | ICD-10-CM

## 2021-08-13 LAB — BASIC METABOLIC PANEL
Anion gap: 12 (ref 5–15)
BUN: 24 mg/dL — ABNORMAL HIGH (ref 6–20)
CO2: 20 mmol/L — ABNORMAL LOW (ref 22–32)
Calcium: 9.5 mg/dL (ref 8.9–10.3)
Chloride: 103 mmol/L (ref 98–111)
Creatinine, Ser: 1.18 mg/dL (ref 0.61–1.24)
GFR, Estimated: >60 mL/min (ref >60)
Glucose, Bld: 76 mg/dL (ref 70–99)
Potassium: 4.4 mmol/L (ref 3.5–5.1)
Sodium: 135 mmol/L (ref 135–145)

## 2021-08-13 LAB — CBC
HCT: 57.8 % — ABNORMAL HIGH (ref 39.0–52.0)
Hemoglobin: 18.2 g/dL — ABNORMAL HIGH (ref 13.0–17.0)
MCH: 27 pg (ref 26.0–34.0)
MCHC: 31.5 g/dL (ref 30.0–36.0)
MCV: 85.8 fL (ref 80.0–100.0)
Platelets: 222 10*3/uL (ref 150–400)
RBC: 6.74 MIL/uL — ABNORMAL HIGH (ref 4.22–5.81)
RDW: 19.3 % — ABNORMAL HIGH (ref 11.5–15.5)
WBC: 10.2 10*3/uL (ref 4.0–10.5)
nRBC: 0 % (ref 0.0–0.2)

## 2021-08-13 LAB — PROTIME-INR
INR: 2.9 — ABNORMAL HIGH (ref 0.8–1.2)
Prothrombin Time: 29.9 seconds — ABNORMAL HIGH (ref 11.4–15.2)

## 2021-08-13 LAB — HEPARIN LEVEL (UNFRACTIONATED): Heparin Unfractionated: 0.33 IU/mL (ref 0.30–0.70)

## 2021-08-13 MED ORDER — ACETAMINOPHEN 325 MG PO TABS: 650.0000 mg | ORAL_TABLET | ORAL | Status: DC | PRN

## 2021-08-13 MED ORDER — ASPIRIN 81 MG PO CHEW: 81.0000 mg | CHEWABLE_TABLET | Freq: Every day | ORAL | Status: DC

## 2021-08-13 MED ORDER — WARFARIN SODIUM 4 MG PO TABS
4.0000 mg | ORAL_TABLET | Freq: Every day | ORAL | 6 refills | Status: DC
  Filled 2021-08-13: qty 30, 30d supply, fill #0

## 2021-08-13 MED ORDER — METOPROLOL SUCCINATE ER 25 MG PO TB24
25.0000 mg | ORAL_TABLET | Freq: Every day | ORAL | 6 refills | Status: DC
Start: 2021-08-13 — End: 2021-08-18
  Filled 2021-08-13: qty 30, 30d supply, fill #0

## 2021-08-13 NOTE — TOC Transition Note (Addendum)
Transition of Care [facility]) - CM/SW Discharge Note   Patient Details  Name: RAJINDER MESICK MRN: [id number] Date of Birth: 13-May-1980  Transition of Care [facility]) CM/SW Contact:  Kallie Locks, RN Phone Number: [phone] 08/13/2021, 11:24 AM   Clinical Narrative:  Verified with Shanda Bumps in main pharmacy that medication arrangements were made during the weekday by HF team Pharmacist. Patient to have [facility] pharmacy mail order medications. Patient also already has his previous home medications at home. Spoke with Mr. Vultaggio at bedside. Mr. Lederman confirms he was aware medications will be mail ordered. He also endorses he has a 30 day supply of home medications already at home. States his friend will pick him up today from the hospital.  Prior arrangements made by HF team.   Today's treatment team made aware of prior medication arrangements.   Addendum: 1657 Spoke with patient again at bedside. States his friend Will got called out to a job today and will pick him up later this evening. Writer contacted Will (friend) to confirm at [phone]. Will confirms patient is staying/living with him. States he will pick patient up after he gets off of work but not sure what time. Will assures writer that he will come to pick Mr. Akel up this evening. Will is aware Mr. Monk has been discharged. Treatment team and nursing made aware.     Final next level of care: Home/Self Care Barriers to Discharge: No Barriers Identified   Patient Goals and CMS Choice Patient states their goals for this hospitalization and ongoing recovery are:: return home      Discharge Placement                       Discharge Plan and Services   Discharge Planning Services: CM Consult, Medication Assistance, MATCH Program, Follow-up appt scheduled                                 Social Determinants of Health (SDOH) Interventions     Readmission Risk Interventions     View : No data  to display.

## 2021-08-13 NOTE — Discharge Instructions (Signed)
Heart Health low salt diet  Very important to take medications as instructed. It will help prolong your life.   Please keep your follow up appointments.   Stop smoking please

## 2021-08-13 NOTE — Progress Notes (Signed)
ANTICOAGULATION CONSULT NOTE - Consult Pharmacy Consult for TPA > heparin + warfarin  Indication: mechanical mitral valve thrombus  Allergies  Allergen Reactions   Chlorhexidine    Iodide Rash    redness    Patient Measurements: Height: 5\' 11"  (180.3 cm) Weight: 77.9 kg (171 lb 11.8 oz) IBW/kg (Calculated) : 75.3   Vital Signs: Temp: 98 F (36.7 C) (05/27 1232) Temp Source: Oral (05/27 1232) BP: 109/76 (05/27 1232) Pulse Rate: 65 (05/27 1232)  Labs: Recent Labs    08/11/21 0108 08/12/21 0051 08/13/21 0056  HGB 16.6 17.3* 18.2*  HCT 51.9 53.1* 57.8*  PLT 167 207 222  LABPROT 18.0* 22.4* 29.9*  INR 1.5* 2.0* 2.9*  HEPARINUNFRC 0.35 0.43 0.33  CREATININE 1.20 1.13 1.18     Estimated Creatinine Clearance: 86 mL/min (by C-G formula based on SCr of 1.18 mg/dL).   Medical History: Past Medical History:  Diagnosis Date   COPD (chronic obstructive pulmonary disease) (HCC)    DVT (deep venous thrombosis) (HCC)     Assessment: 41yom with mechanical mitral valve admitted with valve thrombus and INR < 2  - med non-compliance.   Started on tpa 1mg /hr per [facility] paper- in shadow chart.  Transitioned to heparin/warfarin 5/22. Heparin level 0.33 at goal on heparin drip rate 1350 uts/hr   Warfarin restarted 5/22 INR trend up 2.9 Per TEE 5/23  thrombus size reduced No new signs/symptoms of bleed per nurse  Goal of Therapy:  INR goal 2.5-3.5 Heparin level 0.3-0.7 units/ml Monitor platelets by anticoagulation protocol: Yes   Plan:  Stop IV heparin. Warfarin 4mg  daily  He has warfarin 2mg  tabs at home will adjust doses with that in mind Daily CBC protime and heparin level Monitor s/s bleeding    6/22, 6/23, [facility] Clinical Pharmacist  08/13/2021 3:22 PM   [facility] pharmacy phone numbers are listed on [email or web address]

## 2021-08-13 NOTE — Progress Notes (Signed)
Patient discharged via private vehicle with all belongings. Alert and oriented times four with VS stable. Patient had no questions and verbalized understanding of discharge instructions given by day RN.

## 2021-08-16 ENCOUNTER — Ambulatory Visit (INDEPENDENT_AMBULATORY_CARE_PROVIDER_SITE_OTHER): Payer: Self-pay

## 2021-08-16 ENCOUNTER — Other Ambulatory Visit (HOSPITAL_COMMUNITY): Payer: Self-pay

## 2021-08-16 ENCOUNTER — Telehealth (HOSPITAL_COMMUNITY): Payer: Self-pay | Admitting: Licensed Clinical Social Worker

## 2021-08-16 DIAGNOSIS — Z952 Presence of prosthetic heart valve: Secondary | ICD-10-CM

## 2021-08-16 DIAGNOSIS — I82403 Acute embolism and thrombosis of unspecified deep veins of lower extremity, bilateral: Secondary | ICD-10-CM

## 2021-08-16 DIAGNOSIS — I4891 Unspecified atrial fibrillation: Secondary | ICD-10-CM

## 2021-08-16 DIAGNOSIS — I639 Cerebral infarction, unspecified: Secondary | ICD-10-CM

## 2021-08-16 DIAGNOSIS — Z5181 Encounter for therapeutic drug level monitoring: Secondary | ICD-10-CM

## 2021-08-16 DIAGNOSIS — Z7901 Long term (current) use of anticoagulants: Secondary | ICD-10-CM

## 2021-08-16 LAB — POCT INR: INR: 6.2 — AB (ref 2.0–3.0)
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
## 2021-08-16 NOTE — Progress Notes (Signed)
Called [facility] to follow-up on STAT lab. Spoke with Bonnee Quin who stated specimen has not be dropped off yet; however it was picked up at [facility], Will continue to monitor.

## 2021-08-16 NOTE — Telephone Encounter (Signed)
CSW called pt to discuss transportation to upcoming appts.  Pt reports he I aware of coumadin appt today and has a way to get there.  Also states that he thinks he should have transportation to clinic appt on Thursday.   CSW discuss pt scooter as an unreliable form of transport and that we would be concerned with him planning on coming in that manner as it takes him over 2 hours to get here.  Pt working on finding ride with people- CSW informed we could help with gas money if he is able to find someone to bring him to appt if this helps.  CSW will plan to reach out again to confirm he can make it to appt.  Will continue to follow and assist as needed  Burna Sis, LCSW Clinical Social Worker [facility] Desk#: [phone] Cell#: [phone]

## 2021-08-17 ENCOUNTER — Telehealth (HOSPITAL_COMMUNITY): Payer: Self-pay

## 2021-08-17 ENCOUNTER — Other Ambulatory Visit (HOSPITAL_COMMUNITY): Payer: Self-pay

## 2021-08-17 ENCOUNTER — Telehealth: Payer: Self-pay

## 2021-08-17 LAB — PROTIME-INR
INR: 6 (ref 0.9–1.2)
Prothrombin Time: 57.5 s — ABNORMAL HIGH (ref 9.1–12.0)

## 2021-08-17 NOTE — Telephone Encounter (Signed)
Called and was unable to leave patient a voice message to confirm/remind patient of their appointment at the [facility] on 08/18/21.

## 2021-08-17 NOTE — Telephone Encounter (Signed)
Attempted to reach Mark Garrett to remind him of his clinic visit tomorrow where I plan to meet him to establish paramedicine but the phone rang 4-5 times and hung up. Unable to leave a message. I sent a text also just in case. I will follow up.   Salena Saner, [place] 08/17/2021

## 2021-08-17 NOTE — Progress Notes (Signed)
Discussed dosing instructions with Marcelle Overlie, Pharm D. Attempted to call pt and provide new dosing instructions. No answer and unable to leave voicemail.

## 2021-08-17 NOTE — Telephone Encounter (Signed)
Dr. Bing Matter received PT/INR of 6. Tried to reach pt x 2 unsuccessfully. Routed results to Leary Roca, Charity fundraiser. Chart notes showed she had reached out to pt and addressed the abnormal lab.

## 2021-08-18 ENCOUNTER — Telehealth: Payer: Self-pay

## 2021-08-18 ENCOUNTER — Ambulatory Visit (HOSPITAL_COMMUNITY)
Admission: RE | Admit: 2021-08-18 | Discharge: 2021-08-18 | Disposition: A | Discharge status: Home / Self Care | Payer: Medicaid Other | Source: Ambulatory Visit | Attending: Family Medicine | Admitting: Family Medicine

## 2021-08-18 ENCOUNTER — Encounter (HOSPITAL_COMMUNITY): Payer: Self-pay

## 2021-08-18 ENCOUNTER — Telehealth (HOSPITAL_COMMUNITY): Payer: Self-pay | Admitting: Licensed Clinical Social Worker

## 2021-08-18 VITALS — BP 150/78 | HR 68 | Wt 184.8 lb

## 2021-08-18 DIAGNOSIS — Z7901 Long term (current) use of anticoagulants: Secondary | ICD-10-CM | POA: Insufficient documentation

## 2021-08-18 DIAGNOSIS — I739 Peripheral vascular disease, unspecified: Secondary | ICD-10-CM | POA: Insufficient documentation

## 2021-08-18 DIAGNOSIS — S98912D Complete traumatic amputation of left foot, level unspecified, subsequent encounter: Secondary | ICD-10-CM | POA: Insufficient documentation

## 2021-08-18 DIAGNOSIS — Z8679 Personal history of other diseases of the circulatory system: Secondary | ICD-10-CM

## 2021-08-18 DIAGNOSIS — F1010 Alcohol abuse, uncomplicated: Secondary | ICD-10-CM

## 2021-08-18 DIAGNOSIS — Z952 Presence of prosthetic heart valve: Secondary | ICD-10-CM | POA: Insufficient documentation

## 2021-08-18 DIAGNOSIS — I871 Compression of vein: Secondary | ICD-10-CM

## 2021-08-18 DIAGNOSIS — Z91148 Patient's other noncompliance with medication regimen for other reason: Secondary | ICD-10-CM | POA: Insufficient documentation

## 2021-08-18 DIAGNOSIS — X58XXXD Exposure to other specified factors, subsequent encounter: Secondary | ICD-10-CM | POA: Insufficient documentation

## 2021-08-18 DIAGNOSIS — I5022 Chronic systolic (congestive) heart failure: Secondary | ICD-10-CM

## 2021-08-18 DIAGNOSIS — Z139 Encounter for screening, unspecified: Secondary | ICD-10-CM

## 2021-08-18 DIAGNOSIS — F17200 Nicotine dependence, unspecified, uncomplicated: Secondary | ICD-10-CM | POA: Insufficient documentation

## 2021-08-18 DIAGNOSIS — S98919A Complete traumatic amputation of unspecified foot, level unspecified, initial encounter: Secondary | ICD-10-CM

## 2021-08-18 DIAGNOSIS — Z79899 Other long term (current) drug therapy: Secondary | ICD-10-CM | POA: Insufficient documentation

## 2021-08-18 DIAGNOSIS — Z951 Presence of aortocoronary bypass graft: Secondary | ICD-10-CM | POA: Insufficient documentation

## 2021-08-18 DIAGNOSIS — Z720 Tobacco use: Secondary | ICD-10-CM

## 2021-08-18 DIAGNOSIS — I340 Nonrheumatic mitral (valve) insufficiency: Secondary | ICD-10-CM | POA: Insufficient documentation

## 2021-08-18 DIAGNOSIS — Z8673 Personal history of transient ischemic attack (TIA), and cerebral infarction without residual deficits: Secondary | ICD-10-CM | POA: Insufficient documentation

## 2021-08-18 DIAGNOSIS — I513 Intracardiac thrombosis, not elsewhere classified: Secondary | ICD-10-CM

## 2021-08-18 DIAGNOSIS — I2721 Secondary pulmonary arterial hypertension: Secondary | ICD-10-CM

## 2021-08-18 DIAGNOSIS — Z86718 Personal history of other venous thrombosis and embolism: Secondary | ICD-10-CM | POA: Insufficient documentation

## 2021-08-18 DIAGNOSIS — I2510 Atherosclerotic heart disease of native coronary artery without angina pectoris: Secondary | ICD-10-CM | POA: Insufficient documentation

## 2021-08-18 LAB — BASIC METABOLIC PANEL
Anion gap: 6 (ref 5–15)
BUN: 27 mg/dL — ABNORMAL HIGH (ref 6–20)
CO2: 22 mmol/L (ref 22–32)
Calcium: 9.7 mg/dL (ref 8.9–10.3)
Chloride: 111 mmol/L (ref 98–111)
Creatinine, Ser: 1.53 mg/dL — ABNORMAL HIGH (ref 0.61–1.24)
GFR, Estimated: 57 mL/min — ABNORMAL LOW (ref >60)
Glucose, Bld: 93 mg/dL (ref 70–99)
Potassium: 5.5 mmol/L — ABNORMAL HIGH (ref 3.5–5.1)
Sodium: 139 mmol/L (ref 135–145)

## 2021-08-18 LAB — DIGOXIN LEVEL: Digoxin Level: 0.8 ng/mL (ref 0.8–2.0)

## 2021-08-18 LAB — CBC
HCT: 52.3 % — ABNORMAL HIGH (ref 39.0–52.0)
Hemoglobin: 16 g/dL (ref 13.0–17.0)
MCH: 26.8 pg (ref 26.0–34.0)
MCHC: 30.6 g/dL (ref 30.0–36.0)
MCV: 87.8 fL (ref 80.0–100.0)
Platelets: 244 10*3/uL (ref 150–400)
RBC: 5.96 MIL/uL — ABNORMAL HIGH (ref 4.22–5.81)
RDW: 18.3 % — ABNORMAL HIGH (ref 11.5–15.5)
WBC: 9.9 10*3/uL (ref 4.0–10.5)
nRBC: 0 % (ref 0.0–0.2)

## 2021-08-18 LAB — PROTIME-INR
INR: 4 — ABNORMAL HIGH (ref 0.8–1.2)
Prothrombin Time: 38.8 seconds — ABNORMAL HIGH (ref 11.4–15.2)

## 2021-08-18 MED ORDER — ENTRESTO 97-103 MG PO TABS: 1.0000 | ORAL_TABLET | Freq: Two times a day (BID) | ORAL | 11 refills | Status: DC

## 2021-08-18 NOTE — Progress Notes (Signed)
Sent Dr Shirlee Latch and nurse message below to make them aware:   Memory Dance, RN  Laurey Morale, MD; Landry Dyke M, RN  This pt is followed by Coumadin Clinic and came to [facility] office on 08/16/21 to have INR checked As you know from hx, compliance and transportation is a huge issue for Mr Peters.  He was discharged from the hospital on 08/13/21. At anticoagulation appt on Tuesday, he told me he has drank "more than a pint of liquor since discharge".   Since his POC INR was 6.2, I had PT/INR drawn at lab and told the patient I would call as soon lab resulted and give further dosing instructions. Lab INR was 6.0. I am unable to get in touch with pt. I have attempted to call multiple times.   I wanted to make you aware of this since he was recently hospitalized with thrombosis of the mechanical mitral valve and INR was supratherapeutic.   Thanks,  Cammy Copa, RN      08/18/21 @ 1033: able to get in touch with pt after multiple attempts. Provided dosing instructions and made aware of appt on 08/23/21 at [facility] office.

## 2021-08-18 NOTE — Telephone Encounter (Signed)
CSW called pt to confirm if he has a ride to appt today.  Phone went straight to VM but unable to leave message- will attempt to contact again at a later time.  Jorge Ny, LCSW Clinical Social Worker [facility] Desk#: [phone] Cell#: [phone]

## 2021-08-18 NOTE — Addendum Note (Signed)
Encounter addended by: Burna Sis, LCSW on: 08/18/2021 2:00 PM  Actions taken: Flowsheet accepted, Clinical Note Signed

## 2021-08-18 NOTE — Patient Instructions (Addendum)
INCREASE Entresto to 97/103 mg, one tab twice a day -until mail order is received take 2 tabs of 49/51 mg twice a day  Labs today We will only contact you if something comes back abnormal or we need to make some changes. Otherwise no news is good news!  Labs needed in 7-10 days  You have been referred to [place] for further CHF management in the comfort of your home. A team member will be in contact with you to arrange a home visit   Your physician recommends that you schedule a follow-up appointment in: 3 weeks with the pharmacy team in 6 weeks  in the Advanced Practitioners (PA/NP) Clinic and in 12 weeks with Dr Aundra Dubin   Do the following things EVERYDAY: Weigh yourself in the morning before breakfast. Write it down and keep it in a log. Take your medicines as prescribed Eat low salt foods--Limit salt (sodium) to 2000 mg per day.  Stay as active as you can everyday Limit all fluids for the day to less than 2 liters  At the [facility], you and your health needs are our priority. As part of our continuing mission to provide you with exceptional heart care, we have created designated Provider Care Teams. These Care Teams include your primary Cardiologist (physician) and Advanced Practice Providers (APPs- Physician Assistants and Nurse Practitioners) who all work together to provide you with the care you need, when you need it.   You may see any of the following providers on your designated Care Team at your next follow up: Dr Glori Bickers Dr Haynes Kerns, NP Lyda Jester, [place] [facility] [place], [place] Audry Riles, PharmD   Please be sure to bring in all your medications bottles to every appointment.   If you have any questions or concerns before your next appointment please send [place] a message through [place] or call our office at [phone].    TO LEAVE A MESSAGE FOR THE NURSE SELECT OPTION 2, PLEASE  LEAVE A MESSAGE INCLUDING: YOUR NAME DATE OF BIRTH CALL BACK NUMBER REASON FOR CALL**this is important as we prioritize the call backs  YOU WILL RECEIVE A CALL BACK THE SAME DAY AS LONG AS YOU CALL BEFORE 4:00 PM

## 2021-08-18 NOTE — Telephone Encounter (Signed)
CSW attempted to call pt again regarding appt today.  Unable to reach or leave a message.  Sent text requesting call back.  Burna Sis, LCSW Clinical Social Worker [facility] Desk#: [phone] Cell#: [phone]

## 2021-08-18 NOTE — Addendum Note (Signed)
Encounter addended by: Jacklynn Ganong, FNP on: 08/18/2021 1:41 PM  Actions taken: Clinical Note Signed

## 2021-08-18 NOTE — Progress Notes (Signed)
Heart and Vascular Care Navigation  08/18/2021  Mark [facility] Feb 02, 1981 [id number]  Reason for Referral: Alcohol abuse/ lack of communication   Engaged with patient face to face for follow up visit for Heart and Vascular Care Coordination.                                                                                                   Assessment: CSW met with pt during clinic appt to discuss above concerns.  Pt has been difficult to get a hold of but pt endorses that number we have on file is the best one to reach him at.  States he has patchy service where he lives but so text message might be more effective.  CSW then spoke with pt about recent alcohol abuse.  Reports he drank 1 pint of liquor yesterday- reports this is fairly common for him.  CSW reiterated that alcohol in this quantity is bad for his health especially given his condition.  Pt has long history of alcohol abuse- reports he was sober for a period of around 7 years but started back around 1 year ago and has been drinking heavily sense.  States when he was sober he was attending [place] meetings which were helpful for him.  At this time he states he does not need resources as he can quit whenever he likes.  CSW asked him to explain why he hasn't stopped then or cut back usage and he reports he is not interested in quitting or cutting back at this time.  CSW encouraged pt to reach out to clinic if he changes his mind and becomes agreeable.                                     HRT/VAS Care Coordination     Patients Home Cardiology Office Heart Failure Clinic   Outpatient Care Team [facility] Paramedic Name: Salena Saner- [phone]   Social Worker Name: Mark Garrett [facility] [phone]   Living arrangements for the past 2 months Single Family Home   Lives with: Roommate   Patient Current Insurance Coverage Self-Pay   Patient Has Concern With Paying Medical Bills Yes   Patient Concerns With  Medical Bills no insurance   Medical Bill Referrals: being assessed for Medicaid   Does Patient Have Prescription Coverage? No   Patient Prescription Assistance Programs Heart Failure Fund   Home Assistive Devices/Equipment Prosthesis   DME Agency NA       Social History:                                                                             SDOH Screenings   Alcohol Screen: Medium Risk  Last Alcohol Screening Score (AUDIT): 12  Depression (PHQ2-9): Low Risk    PHQ-2 Score: 0  Financial Resource Strain: High Risk   Difficulty of Paying Living Expenses: Very hard  Food Insecurity: No Food Insecurity   Worried About Charity fundraiser in the Last Year: Never true   Ran Out of Food in the Last Year: Never true  Housing: High Risk   Last Housing Risk Score: 2  Physical Activity: Not on file  Social Connections: Not on file  Stress: Not on file  Tobacco Use: High Risk   Smoking Tobacco Use: Every Day   Smokeless Tobacco Use: Never   Passive Exposure: Not on file  Transportation Needs: Unmet Transportation Needs   Lack of Transportation (Medical): Yes   Lack of Transportation (Non-Medical): Yes    SDOH Interventions: Financial Resources:  Financial Strain Interventions: Other (Comment) [place]) Social Security for Disability application assistance- was denied for Medicaid per financial counseling notes  Food Insecurity:   None reported  Housing Insecurity:  Not assessed  Transportation:   Utilizes scooter but reports no concerns with getting to appts other than gas costs- provided with gift card    Other Care Navigation Interventions:     Inpatient/Outpatient Substance Abuse Counseling/Rehab Options Discussed but pt reports he is not in need of rehab at this time  Provided Pharmacy assistance resources  N/a  Patient expressed Mental Health concerns No.  Patient Referred to: N/a   Follow-up plan:    CSW will continue to follow pt through  paramedicine program and assist as needed  Mark Garrett, [place] Social Worker [facility] Desk#: [phone] Cell#: [phone]

## 2021-08-18 NOTE — Telephone Encounter (Signed)
Magda Bernheim CMA sent a message asking if pt could come for a repeat BMET as it was too far for him to travel for blood work. BMET ordered and times given for pt to come in. Lab req sent to lab.

## 2021-08-18 NOTE — Addendum Note (Signed)
Encounter addended by: Jacklynn Ganong, FNP on: 08/18/2021 2:11 PM  Actions taken: Clinical Note Signed, Result note filed

## 2021-08-19 ENCOUNTER — Encounter: Payer: Self-pay | Admitting: Nurse Practitioner

## 2021-08-19 ENCOUNTER — Ambulatory Visit (INDEPENDENT_AMBULATORY_CARE_PROVIDER_SITE_OTHER): Payer: Self-pay | Admitting: Nurse Practitioner

## 2021-08-19 VITALS — BP 144/92 | HR 64 | Temp 97.9°F | Ht 68.0 in | Wt 185.8 lb

## 2021-08-19 DIAGNOSIS — T8209XS Other mechanical complication of heart valve prosthesis, sequela: Secondary | ICD-10-CM

## 2021-08-19 DIAGNOSIS — I513 Intracardiac thrombosis, not elsewhere classified: Secondary | ICD-10-CM

## 2021-08-19 LAB — POCT URINALYSIS DIP (CLINITEK)
Bilirubin, UA: NEGATIVE
Glucose, UA: 500 mg/dL — AB
Ketones, POC UA: NEGATIVE mg/dL
Leukocytes, UA: NEGATIVE
Nitrite, UA: NEGATIVE
POC PROTEIN,UA: 100 — AB
Spec Grav, UA: 1.02 (ref 1.010–1.025)
Urobilinogen, UA: 0.2 E.U./dL
pH, UA: 5.5 (ref 5.0–8.0)

## 2021-08-19 LAB — POCT GLYCOSYLATED HEMOGLOBIN (HGB A1C)
HbA1c POC (<> result, manual entry): 5.2 % (ref 4.0–5.6)
HbA1c, POC (controlled diabetic range): 5.2 % (ref 0.0–7.0)
HbA1c, POC (prediabetic range): 5.2 % — AB (ref 5.7–6.4)
Hemoglobin A1C: 5.2 % (ref 4.0–5.6)

## 2021-08-19 NOTE — Patient Instructions (Addendum)
1. 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, sequela   2. 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  - Continue Entresto 49/51 bid  - Continue Digoxin 0.125, level ok today.   - Continue Toprol XL 25 mg daily.  -ASA 81, crestor 40   3. Hyperkalemia - per note in chart from yesterday - Stop spironolactone 25 mg daily.  -Do not take potassium supplements   Follow up:  Follow up in 3 months or sooner if needed - may need dig level

## 2021-08-19 NOTE — Progress Notes (Signed)
@Patient  ID: Mark Garrett, male    DOB: [birth year], 41 y.o.   MRN: [id number]  Chief Complaint  Patient presents with   Hospitalization Follow-up    Pt is here for hospital follow up. Pt stated he fell 2 day's ago while kayaking pt has a bruised rt forearm     Referring provider: No ref. provider found   Recent significant events:  Seen in clinic on 5/16. Echo was done showing EF 30% with moderate LVH, mildly decreased RV systolic function, PASP 59 mmHg, mechanical mitral valve with elevated mean gradient 17 mmHg and MVA 0.66 cm^2 by VTI suggesting prosthetic valve obstruction/stenosis. 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  Mean gradient 12 mmHg, MVA 0.73 cm^2 by VTI.  Minimal mitral regurgitation (physiological for valve). Normal caliber thoracic aorta.    He was directed admitted to the ICU for further management and treatment w/ TPA. Central access was obtained and he was started on alteplase gtt. He was monitored w/ serial TTEs which showed interval improvement in mean gradient and reduction in thrombus size. After 48 hrs of alteplase gtt was transitioned back to warfarin w/ heparin bridge on 5/22. Had repeat TEE on 5/23 that showed mechanical mitral valve much improved with no restriction from thrombus, mean gradient 2 mmHg.     He was continued on heparin gtt until INR reached therapeutic range. Goal INR (2.5-3.5). He remained stable from CHF standpoint. Co-ox and CVP monitored closely.    Close Coumadin Clinic f/u has been arranged in [place] on 5/30 and f/u in the [facility] arranged for 6/1. SW consulted to assist w/ help w/ transportation to and from appointments.   HPI  41 y.o. with history of CAD s/p CABG in 2006 in [place], mechanical mitral valve replacement, chronic systolic CHF, and PAD returns  for followup of CHF. Patient had crush injury to left foot in 2004, lost the foot.  Has a prosthesis.  He had CABG in 2006.  Patient presents today for a hospital follow up. He was seen by heart failure clinic yesterday. He has been doing well since hospital discharge. He continues on Farxiga 10 mg daily, spironolactone 25 mg daily, Entresto 49/51 bid, Digoxin 0.125, Toprol XL 25 mg daily, and ASA 81, and crestor 40 mg daily.  BMET scheduled for recheck next week due to hyperkalemia noted through labs at clinic yesterday. Denies f/c/s, n/v/d, hemoptysis, PND, chest pain or edema.    Note from labs yesterday...K is too high. Do not increase Entresto as discussed today, keep at 49/51 bid.  Stop spiro. Do not take any potassium supplements.   Allergies  Allergen Reactions   Chlorhexidine    Iodide Rash    redness     There is no immunization history on file for this patient.  Past Medical History:  Diagnosis Date   COPD (chronic obstructive pulmonary disease) (HCC)    DVT (deep venous thrombosis) (HCC)     Tobacco History: Social History   Tobacco Use  Smoking Status Every Day   Packs/day: 0.50   Types: Cigarettes  Smokeless Tobacco Never   Ready to quit: Not Answered Counseling given: Not Answered   Outpatient Encounter Medications as of 08/19/2021  Medication Sig   dapagliflozin propanediol (FARXIGA) 10 MG TABS tablet Take 1 tablet by mouth daily.   digoxin (LANOXIN) 0.125 MG tablet Take 1 tablet  by mouth daily.   metoprolol succinate (TOPROL-XL) 25 MG 24 hr tablet Take 1/2 tablet by mouth daily.   rosuvastatin (CRESTOR) 40 MG tablet Take 1 tablet by mouth daily.   sacubitril-valsartan (ENTRESTO) 97-103 MG Take 1 tablet by mouth 2 (two) times daily.   sildenafil (REVATIO) 20 MG tablet Take 2 tablets by mouth 3 times daily.   spironolactone (ALDACTONE) 25 MG tablet Take 1 tablet by mouth daily.   warfarin (COUMADIN) 2 MG tablet Take 2 mg by mouth daily.   acetaminophen  (TYLENOL) 325 MG tablet Take 2 tablets (650 mg total) by mouth every 4 (four) hours as needed for headache or mild pain. (Patient not taking: Reported on 08/19/2021)   aspirin 81 MG chewable tablet Chew 1 tablet (81 mg total) by mouth daily. (Patient not taking: Reported on 08/19/2021)   No facility-administered encounter medications on file as of 08/19/2021.     Review of Systems  Review of Systems  Constitutional: Negative.   HENT: Negative.    Cardiovascular: Negative.   Gastrointestinal: Negative.   Allergic/Immunologic: Negative.   Neurological: Negative.   Psychiatric/Behavioral: Negative.        Physical Exam  BP (!) 144/92 (BP Location: Left Arm, Patient Position: Sitting, Cuff Size: Normal)   Pulse 64   Temp 97.9 F (36.6 C)   Ht  (1.727 m)   Wt 185 lb 12.8 oz (84.3 kg)   SpO2 100%   BMI 28.25 kg/m   Wt Readings from Last 5 Encounters:  08/19/21 185 lb 12.8 oz (84.3 kg)  08/18/21 184 lb 12.8 oz (83.8 kg)  08/13/21 171 lb 11.8 oz (77.9 kg)  08/02/21 185 lb 12.8 oz (84.3 kg)  06/21/21 182 lb 12.8 oz (82.9 kg)     Physical Exam Vitals and nursing note reviewed.  Constitutional:      General: He is not in acute distress.    Appearance: He is well-developed.  Cardiovascular:     Rate and Rhythm: Normal rate and regular rhythm.  Pulmonary:     Effort: Pulmonary effort is normal.     Breath sounds: Normal breath sounds.  Skin:    General: Skin is warm and dry.  Neurological:     Mental Status: He is alert and oriented to person, place, and time.     Lab Results:  CBC    Component Value Date/Time   WBC 9.9 08/18/2021 1238   RBC 5.96 (H) 08/18/2021 1238   HGB 16.0 08/18/2021 1238   HCT 52.3 (H) 08/18/2021 1238   PLT 244 08/18/2021 1238   MCV 87.8 08/18/2021 1238   MCH 26.8 08/18/2021 1238   MCHC 30.6 08/18/2021 1238   RDW 18.3 (H) 08/18/2021 1238   LYMPHSABS 2.1 08/05/2021 1456   MONOABS 0.9 08/05/2021 1456   EOSABS 0.2 08/05/2021 1456    BASOSABS 0.1 08/05/2021 1456    BMET    Component Value Date/Time   NA 139 08/18/2021 1238   K 5.5 (H) 08/18/2021 1238   CL 111 08/18/2021 1238   CO2 22 08/18/2021 1238   GLUCOSE 93 08/18/2021 1238   BUN 27 (H) 08/18/2021 1238   CREATININE 1.53 (H) 08/18/2021 1238   CALCIUM 9.7 08/18/2021 1238   GFRNONAA 57 (L) 08/18/2021 1238    BNP    Component Value Date/Time   BNP 1,733.0 (H) 01/27/2021 1034    ProBNP No results found for: PROBNP  Imaging: DG Chest Port 1 View  Result Date: 08/06/2021 CLINICAL DATA:  CHF  EXAM: PORTABLE CHEST 1 VIEW COMPARISON:  Previous studies including the examination of 08/05/2021 FINDINGS: Transverse diameter of heart is increased. There are no signs of alveolar pulmonary edema. Small linear densities seen in the right mid and right lower lung fields. There is blunting of right lateral CP angle. Left lateral CP angle is clear. There is no pneumothorax. Tip of right IJ central venous catheter is seen in the superior vena cava. Old malunited fracture is seen in the shaft of right clavicle. There is evidence of previous cardiac surgery. IMPRESSION: Cardiomegaly. There are no signs of pulmonary edema. Small linear densities in the right mid and right lower lung fields may suggest scarring or subsegmental atelectasis. Blunting of right lateral CP angle may be due to small effusion or pleural thickening. Electronically Signed   By: Ernie Avena M.D.   On: 08/06/2021 09:52   DG CHEST PORT 1 VIEW  Result Date: 08/05/2021 CLINICAL DATA:  Central line placement EXAM: PORTABLE CHEST 1 VIEW COMPARISON:  03/08/2021 FINDINGS: Right IJ approach catheter with tip overlying the SVC. Stable cardiomegaly. No focal pulmonary opacity or pleural effusion. No pneumothorax. No acute osseous abnormality. IMPRESSION: 1. Right CVC with tip overlying the SVC. 2. Cardiomegaly with no additional acute cardiopulmonary process. Electronically Signed   By: Wiliam Ke M.D.   On:  08/05/2021 14:07   ECHOCARDIOGRAM COMPLETE  Result Date: 08/02/2021    ECHOCARDIOGRAM REPORT   Patient Name:   AMAREON PHUNG Date of Exam: 08/02/2021 Medical Rec #:  [id number]       Height:       71.0 in Accession #:    [id number]      Weight:       182.8 lb Date of Birth:  02/07/1981        BSA:          2.029 m Patient Age:    41 years        BP:           150/90 mmHg Patient Gender: M               HR:           92 bpm. Exam Location:  Inpatient Procedure: 2D Echo, 3D Echo, Cardiac Doppler, Color Doppler and Strain Analysis Indications:    CHF  History:        Patient has prior history of Echocardiogram examinations, most                 recent 02/19/2021. COPD; Signs/Symptoms:Murmur. CABG MVR 02/17/21.  Sonographer:    Neomia Dear RDCS Referring Phys: [id number] DALTON S MCLEAN IMPRESSIONS  1. Left ventricular ejection fraction, by estimation, is 30%. The left ventricle has moderate to severely decreased function. The left ventricle demonstrates global hypokinesis with inferolateral akinesis. moderate left ventricular hypertrophy. Left ventricular diastolic parameters are indeterminate.  2. Right ventricular systolic function is mildly reduced. The right ventricular size is normal. There is moderately elevated pulmonary artery systolic pressure. The estimated right ventricular systolic pressure is 59.0 mmHg.  3. Left atrial size was moderately dilated.  4. Right atrial size was mildly dilated.  5. The aortic valve is tricuspid. Aortic valve regurgitation is trivial. No aortic stenosis is present.  6. Mechanical mitral valve, not visualized well. No significant MR noted. Elevated gradient, mean 17 mmHg with calculated MVA 0.66 cm^2 by VTI. Concern for prosthetic valve obstruction.  7. The inferior vena cava is normal in size with greater than 50% respiratory  variability, suggesting right atrial pressure of 3 mmHg. FINDINGS  Left Ventricle: Left ventricular ejection fraction, by estimation, is 30%. The left ventricle  has moderate to severely decreased function. The left ventricle demonstrates global hypokinesis. The left ventricular internal cavity size was normal in size. There is moderate left ventricular hypertrophy. Left ventricular diastolic parameters are indeterminate. Right Ventricle: The right ventricular size is normal. No increase in right ventricular wall thickness. Right ventricular systolic function is mildly reduced. There is moderately elevated pulmonary artery systolic pressure. The tricuspid regurgitant velocity is 3.74 m/s, and with an assumed right atrial pressure of 3 mmHg, the estimated right ventricular systolic pressure is 59.0 mmHg. Left Atrium: Left atrial size was moderately dilated. Right Atrium: Right atrial size was mildly dilated. Pericardium: There is no evidence of pericardial effusion. Mitral Valve: Mechanical mitral valve, not visualized well. No significant MR noted. Elevated gradient, mean 17 mmHg with calculated MVA 0.66 cm^2 by VTI. Concern for prosthetic valve obstruction. The mitral valve has been repaired/replaced. No evidence of mitral valve regurgitation. MV peak gradient, 29.6 mmHg. The mean mitral valve gradient is 17.0 mmHg. Tricuspid Valve: The tricuspid valve is normal in structure. Tricuspid valve regurgitation is trivial. Aortic Valve: The aortic valve is tricuspid. Aortic valve regurgitation is trivial. No aortic stenosis is present. Aortic valve mean gradient measures 6.0 mmHg. Aortic valve peak gradient measures 12.1 mmHg. Aortic valve area, by VTI measures 1.62 cm. Pulmonic Valve: The pulmonic valve was normal in structure. Pulmonic valve regurgitation is trivial. Aorta: The aortic root is normal in size and structure. Venous: The inferior vena cava is normal in size with greater than 50% respiratory variability, suggesting right atrial pressure of 3 mmHg. IAS/Shunts: No atrial level shunt detected by color flow Doppler.  LEFT VENTRICLE PLAX 2D LVIDd:         5.70 cm       Diastology LVIDs:         5.30 cm      LV e' medial:    2.92 cm/s LV PW:         0.90 cm      LV E/e' medial:  69.9 LV IVS:        2.30 cm      LV e' lateral:   6.65 cm/s LVOT diam:     1.90 cm      LV E/e' lateral: 30.7 LV SV:         43 LV SV Index:   21 LVOT Area:     2.84 cm  LV Volumes (MOD) LV vol d, MOD A2C: 170.0 ml LV vol d, MOD A4C: 141.0 ml LV vol s, MOD A2C: 171.0 ml LV vol s, MOD A4C: 105.0 ml LV SV MOD A2C:     -1.0 ml LV SV MOD A4C:     141.0 ml LV SV MOD BP:      20.9 ml RIGHT VENTRICLE RV Basal diam:  3.40 cm RV Mid diam:    2.30 cm RV S prime:     5.59 cm/s TAPSE (M-mode): 0.9 cm LEFT ATRIUM             Index        RIGHT ATRIUM           Index LA diam:        5.90 cm 2.91 cm/m   RA Area:     22.80 cm LA Vol (A2C):   75.7 ml 37.30 ml/m  RA Volume:   78.20  ml  38.53 ml/m LA Vol (A4C):   83.4 ml 41.09 ml/m LA Biplane Vol: 81.2 ml 40.01 ml/m  AORTIC VALVE                     PULMONIC VALVE AV Area (Vmax):    1.66 cm      PV Vmax:          0.74 m/s AV Area (Vmean):   1.69 cm      PV Vmean:         48.900 cm/s AV Area (VTI):     1.62 cm      PV VTI:           0.136 m AV Vmax:           174.00 cm/s   PV Peak grad:     2.2 mmHg AV Vmean:          107.000 cm/s  PV Mean grad:     1.0 mmHg AV VTI:            0.268 m       PR End Diast Vel: 21.72 msec AV Peak Grad:      12.1 mmHg AV Mean Grad:      6.0 mmHg LVOT Vmax:         102.00 cm/s LVOT Vmean:        63.600 cm/s LVOT VTI:          0.153 m LVOT/AV VTI ratio: 0.57  AORTA Ao Asc diam: 3.05 cm MITRAL VALVE                TRICUSPID VALVE MV Area (PHT): 3.48 cm     TR Peak grad:   56.0 mmHg MV Area VTI:   0.66 cm     TR Vmax:        374.00 cm/s MV Peak grad:  29.6 mmHg MV Mean grad:  17.0 mmHg    SHUNTS MV Vmax:       2.72 m/s     Systemic VTI:  0.15 m MV Vmean:      193.0 cm/s   Systemic Diam: 1.90 cm MV VTI:        0.66 m MV Decel Time: 218 msec MV E velocity: 204.00 cm/s MV A velocity: 229.00 cm/s MV E/A ratio:  0.89 Dalton McleanMD  Electronically signed by Wilfred Lacy Signature Date/Time: 08/02/2021/2:47:17 PM    Final    ECHO TEE  Result Date: 08/15/2021    TRANSESOPHOGEAL ECHO REPORT   Patient Name:   TREVELLE MCGURN Date of Exam: 08/09/2021 Medical Rec #:  [id number]       Height:       71.0 in Accession #:    [id number]      Weight:       166.7 lb Date of Birth:  07-30-1980        BSA:          1.951 m Patient Age:    41 years        BP:           111/68 mmHg Patient Gender: M               HR:           67 bpm. Exam Location:  Inpatient Procedure: Transesophageal Echo, Cardiac Doppler and Color Doppler Indications:     Mechanical mitrial valve thrombosis  History:         Patient  has prior history of Echocardiogram examinations, most                  recent 10/08/2021. CHF, COPD, Mitral Valve Disease; Risk                  Factors:Dyslipidemia. MVR with ONX 27/64mm Mechanical                  Prosthetic.  Sonographer:     Ross Ludwig RDCS (AE) Referring Phys:  [id number] Roxy Horseman SIMMONS Diagnosing Phys: Freida Busman McleanMD PROCEDURE: After discussion of the risks and benefits of a TEE, an informed consent was obtained from the patient. The transesophogeal probe was passed without difficulty through the esophogus of the patient. Sedation performed by different physician. The patient was monitored while under deep sedation. Anesthestetic sedation was provided intravenously by Anesthesiology: 226.47mg  of Propofol. Image quality was good. The patient developed no complications during the procedure. IMPRESSIONS  1. Left ventricular ejection fraction, by estimation, is 30 to 35%. The left ventricle has moderately decreased function. The left ventricle demonstrates regional wall motion abnormalities with inferior akinesis. There is mild concentric left ventricular hypertrophy.  2. Right ventricular systolic function is moderately reduced. The right ventricular size is normal.  3. Left atrial size was moderately dilated. No left atrial/left atrial  appendage thrombus was detected.  4. Right atrial size was mildly dilated.  5. No PFO or ASD by color doppler.  6. The aortic valve is tricuspid. Aortic valve regurgitation is trivial. No aortic stenosis is present.  7. There was a mechanical On-X mitral valve. This looked much improved with minimal residual thrombus and mean gradient 2 mmHg (no restriction). Minimal mitral regurgitation (physiological for valve). No evidence of mitral stenosis.  8. Normal caliber thoracic aorta. FINDINGS  Left Ventricle: Left ventricular ejection fraction, by estimation, is 30 to 35%. The left ventricle has moderately decreased function. The left ventricle demonstrates regional wall motion abnormalities. The left ventricular internal cavity size was normal in size. There is mild concentric left ventricular hypertrophy. Right Ventricle: The right ventricular size is normal. No increase in right ventricular wall thickness. Right ventricular systolic function is moderately reduced. Left Atrium: Left atrial size was moderately dilated. No left atrial/left atrial appendage thrombus was detected. Right Atrium: Right atrial size was mildly dilated. Pericardium: There is no evidence of pericardial effusion. Mitral Valve: There was a mechanical On-X mitral valve. This looked much improved with minimal residual thrombus and mean gradient 2 mmHg (no restriction). Minimal mitral regurgitation (physiological for valve). The mitral valve is normal in structure. Trivial mitral valve regurgitation. No evidence of mitral valve stenosis. MV peak gradient, 2.9 mmHg. The mean mitral valve gradient is 2.0 mmHg. Tricuspid Valve: The tricuspid valve is normal in structure. Tricuspid valve regurgitation is trivial. Aortic Valve: The aortic valve is tricuspid. Aortic valve regurgitation is trivial. No aortic stenosis is present. Pulmonic Valve: The pulmonic valve was normal in structure. Pulmonic valve regurgitation is not visualized. Aorta: Normal caliber  thoracic aorta. The aortic root is normal in size and structure. IAS/Shunts: No PFO or ASD by color doppler.  MITRAL VALVE MV Peak grad: 2.9 mmHg MV Mean grad: 2.0 mmHg MV Vmax:      0.85 m/s MV Vmean:     59.3 cm/s Dalton McleanMD Electronically signed by Wilfred Lacy Signature Date/Time: 08/15/2021/10:01:26 AM    Final    ECHO TEE  Result Date: 08/05/2021    TRANSESOPHOGEAL ECHO REPORT   Patient Name:  Mark Garrett Date of Exam: 08/05/2021 Medical Rec #:  [id number]       Height:       71.0 in Accession #:    [id number]      Weight:       185.8 lb Date of Birth:  1980/11/12        BSA:          2.044 m Patient Age:    41 years        BP:           123/91 mmHg Patient Gender: M               HR:           55 bpm. Exam Location:  Outpatient Procedure: 3D Echo, Transesophageal Echo, Cardiac Doppler and Color Doppler Indications:     status post mitral valve replacement  History:         Patient has prior history of Echocardiogram examinations, most                  recent 08/02/2021. CAD, Prior CABG, COPD; Risk Factors:Current                  Smoker.                   Mitral Valve: valve is present in the mitral position.  Sonographer:     Delcie Roch RDCS Referring Phys:  [id number] Eliot Ford MCLEAN Diagnosing Phys: Wilfred Lacy PROCEDURE: After discussion of the risks and benefits of a TEE, an informed consent was obtained from the patient. The transesophogeal probe was passed without difficulty through the esophogus of the patient. Imaged were obtained with the patient in a left lateral decubitus position. Sedation performed by different physician. The patient was monitored while under deep sedation. Anesthestetic sedation was provided intravenously by Anesthesiology:  of Propofol. The patient developed no complications during the procedure. IMPRESSIONS  1. Left ventricular ejection fraction, by estimation, is 35 to 40%. The left ventricle has mildly decreased function. The left ventricle  demonstrates regional wall motion abnormalities with basal to mid inferior akinesis.  2. There was a mechanical On-X mitral valve. 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 Mean gradient 12 mmHg, MVA 0.73 cm^2 by VTI. Minimal mitral regurgitation (physiological for valve).  3. Right ventricular systolic function is moderately reduced. The right ventricular size is normal.  4. Left atrial size was moderately dilated. No left atrial/left atrial appendage thrombus was detected.  5. Right atrial size was mildly dilated.  6. No PFO or ASD by color doppler.  7. The aortic valve is tricuspid. Aortic valve regurgitation is not visualized. No aortic stenosis is present.  8. Normal caliber thoracic aorta. FINDINGS  Left Ventricle: Left ventricular ejection fraction, by estimation, is 35 to 40%. The left ventricle has mildly decreased function. The left ventricle demonstrates regional wall motion abnormalities. The left ventricular internal cavity size was normal in size. Right Ventricle: The right ventricular size is normal. No increase in right ventricular wall thickness. Right ventricular systolic function is moderately reduced. Left Atrium: Left atrial size was moderately dilated. No left atrial/left atrial appendage thrombus was detected. Right Atrium: Right atrial size was mildly dilated. Pericardium: There is no evidence of pericardial effusion. Mitral Valve: There was a mechanical On-X mitral valve. 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 Mean gradient 12 mmHg, MVA 0.73 cm^2 by VTI. Minimal  mitral regurgitation (physiological for valve). The mitral valve has been repaired/replaced. Trivial mitral valve regurgitation. There is a present in the mitral position. MV peak gradient, 18.8 mmHg. The mean mitral valve  gradient is 11.5 mmHg. Tricuspid Valve: The tricuspid valve is normal in structure. Tricuspid valve regurgitation is trivial. Aortic Valve: The aortic valve is tricuspid. Aortic valve regurgitation is not visualized. No aortic stenosis is present. Pulmonic Valve: The pulmonic valve was normal in structure. Pulmonic valve regurgitation is trivial. Aorta: Normal caliber thoracic aorta. The aortic root is normal in size and structure. IAS/Shunts: No PFO or ASD by color doppler.  LEFT VENTRICLE PLAX 2D LVOT diam:     2.00 cm LV SV:         82 LV SV Index:   40 LVOT Area:     3.14 cm  AORTIC VALVE LVOT Vmax:   136.00 cm/s LVOT Vmean:  84.100 cm/s LVOT VTI:    0.260 m MITRAL VALVE MV Area VTI:  0.72 cm    SHUNTS MV Peak grad: 18.8 mmHg   Systemic VTI:  0.26 m MV Mean grad: 11.5 mmHg   Systemic Diam: 2.00 cm MV Vmax:      2.17 m/s MV Vmean:     165.0 cm/s Dalton McleanMD Electronically signed by Wilfred Lacy Signature Date/Time: 08/05/2021/7:08:35 PM    Final    ECHOCARDIOGRAM LIMITED  Result Date: 08/08/2021    ECHOCARDIOGRAM LIMITED REPORT   Patient Name:   LELON IKARD Date of Exam: 08/08/2021 Medical Rec #:  [id number]       Height:       70.9 in Accession #:    [id number]      Weight:       177.5 lb Date of Birth:  03-26-1980        BSA:          2.002 m Patient Age:    41 years        BP:           116/78 mmHg Patient Gender: M               HR:           64 bpm. Exam Location:  Inpatient Procedure: Limited Echo, Color Doppler and Cardiac Doppler Indications:    Complications related to mechanical valve  History:        Patient has prior history of Echocardiogram examinations, most                 recent 08/07/2021. CHF, COPD; Risk Factors:Dyslipidemia. 02/17/21                 MVR with ONX 27/30mm Mechanical Prosthetic.                  Mitral Valve: ON-X 27-29 valve is present in the mitral                 position.  Sonographer:    Irving Burton Senior RDCS Referring Phys: [id number] Roxy Horseman SIMMONS IMPRESSIONS  1.  Limited study to assess mechanical MVR.  2. The mitral valve has been repaired/replaced. There is a ON-X 27-29 present in the mitral position. Mean gradient improved to at HR 64bpm. Leaflet excursion appears improved from prior studies (best seen on PLAX). There continues to be a mobile echodensity on the atrial side of the  leaflets. Difficult to measure but based on limited views, appears to be slightly smaller from previous (~2.3cmx1.2cm).  3. Left ventricular ejection fraction, by estimation, is 30 to 35%. The left ventricle has moderately decreased function.  4. The inferior vena cava is normal in size with greater than 50% respiratory variability, suggesting right atrial pressure of 3 mmHg. Comparison(s): Compared to prior study on 08/07/21, the mean mitral gradient is now (previously ). Similar to study on 08/06/21, there continues to be an echodensity on the atrial side of the mitral valve leaflets. FINDINGS  Left Ventricle: Left ventricular ejection fraction, by estimation, is 30 to 35%. The left ventricle has moderately decreased function. Mitral Valve: Mean gradient 3.87mmHg at HR 64bpm. The mitral valve has been repaired/replaced. There is a ON-X 27-29 present in the mitral position. MV peak gradient, 6.2 mmHg. The mean mitral valve gradient is 3.0 mmHg. Tricuspid Valve: The tricuspid valve is normal in structure. Tricuspid valve regurgitation is not demonstrated. Venous: The inferior vena cava is normal in size with greater than 50% respiratory variability, suggesting right atrial pressure of 3 mmHg. LEFT VENTRICLE PLAX 2D LVOT diam:     2.00 cm LV SV:         52 LV SV Index:   26 LVOT Area:     3.14 cm  AORTIC VALVE LVOT Vmax:   114.00 cm/s LVOT Vmean:  78.200 cm/s LVOT VTI:    0.167 m MITRAL VALVE MV Area (PHT): 1.93 cm     SHUNTS MV Area VTI:   1.29 cm     Systemic VTI:  0.17 m MV Peak grad:  6.2 mmHg     Systemic Diam: 2.00 cm MV Mean grad:  3.0 mmHg MV Vmax:       1.24 m/s MV  Vmean:      88.9 cm/s MV VTI:        0.41 m MV PHT:        153.00 msec MV Decel Time: 393 msec MV E velocity: 82.90 cm/s MV A velocity: 116.00 cm/s MV E/A ratio:  0.71 Laurance Flatten MD Electronically signed by Laurance Flatten MD Signature Date/Time: 08/08/2021/11:41:57 AM    Final    ECHOCARDIOGRAM LIMITED  Result Date: 08/07/2021    ECHOCARDIOGRAM LIMITED REPORT   Patient Name:   EINER MEALS Date of Exam: 08/07/2021 Medical Rec #:  [id number]       Height:       71.0 in Accession #:    [id number]      Weight:       177.5 lb Date of Birth:  05-11-1980        BSA:          2.004 m Patient Age:    41 years        BP:           131/64 mmHg Patient Gender: M               HR:           68 bpm. Exam Location:  Inpatient Procedure: 2D Echo, Limited Echo, Cardiac Doppler and Color Doppler Indications:    Mitral valve stenosis  History:        Patient has prior history of Echocardiogram examinations, most                 recent 08/06/2021. CHF, COPD; Mitral Valve Disease.  Mitral Valve: ON-X 27-29 valve is present in the mitral                 position.  Sonographer:    Eduard Roux Referring Phys: [phone] DALTON S MCLEAN IMPRESSIONS  1. Left ventricular ejection fraction, by estimation, is 30 to 35%. The left ventricle has moderately decreased function. The left ventricle demonstrates regional wall motion abnormalities (see scoring diagram/findings for description). The left ventricular internal cavity size was moderately dilated. There is mild concentric left ventricular hypertrophy. Left ventricular diastolic function could not be evaluated.  2. The right ventricular size is mildly enlarged.  3. Left atrial size was severely dilated.  4. Right atrial size was mildly dilated.  5. The mitral valve has been repaired/replaced. The mean mitral valve gradient is 4.0 mmHg with average heart rate of 66 bpm. There is a ON-X 27-29 present in the mitral position. Echo findings are consistent with normal  structure and function of the mitral valve prosthesis.  6. The aortic valve is tricuspid. Aortic valve regurgitation is mild. No aortic stenosis is present.  7. The inferior vena cava is normal in size with greater than 50% respiratory variability, suggesting right atrial pressure of 3 mmHg. Comparison(s): Prior images reviewed side by side. The left ventricular function is unchanged. Mitral valve gradients have improved further. The mobile mass that was associated with the atrial aspect of the prosthesis is no longer present. FINDINGS  Left Ventricle: Left ventricular ejection fraction, by estimation, is 30 to 35%. The left ventricle has moderately decreased function. The left ventricle demonstrates regional wall motion abnormalities. The left ventricular internal cavity size was moderately dilated. There is mild concentric left ventricular hypertrophy. Left ventricular diastolic function could not be evaluated. Left ventricular diastolic function could not be evaluated due to mitral valve replacement.  LV Wall Scoring: The mid and distal lateral wall, entire inferior wall, posterior wall, mid anterolateral segment, apical anterior segment, and apex are akinetic. The mid anterior segment is hypokinetic. The entire septum, basal anterolateral segment, and basal anterior segment are normal. Right Ventricle: The right ventricular size is mildly enlarged. Left Atrium: Left atrial size was severely dilated. Right Atrium: Right atrial size was mildly dilated. Mitral Valve: Mechanical mitral dual disc motion appears normal. The mitral valve has been repaired/replaced. There is a ON-X 27-29 present in the mitral position. Echo findings are consistent with normal structure and function of the mitral valve prosthesis. MV peak gradient, 7.7 mmHg. The mean mitral valve gradient is 4.0 mmHg with average heart rate of 66 bpm. Tricuspid Valve: The tricuspid valve is grossly normal. Aortic Valve: The aortic valve is tricuspid.  Aortic valve regurgitation is mild. No aortic stenosis is present. Pulmonic Valve: The pulmonic valve was grossly normal. Aorta: The aortic root and ascending aorta are structurally normal, with no evidence of dilitation. Venous: The inferior vena cava is normal in size with greater than 50% respiratory variability, suggesting right atrial pressure of 3 mmHg. IAS/Shunts: The interatrial septum was not assessed. LEFT VENTRICLE PLAX 2D LVIDd:         6.70 cm LVIDs:         6.00 cm LV PW:         1.20 cm LV IVS:        1.50 cm  LV Volumes (MOD) LV vol d, MOD A4C: 196.0 ml LV vol s, MOD A4C: 148.0 ml LV SV MOD A4C:     196.0 ml LEFT ATRIUM  Index        RIGHT ATRIUM           Index LA diam:      5.80 cm 2.89 cm/m   RA Area:     19.20 cm LA Vol (A4C): 96.7 ml 48.25 ml/m  RA Volume:   55.00 ml  27.44 ml/m   AORTA Ao Root diam: 3.50 cm Ao Asc diam:  3.50 cm MITRAL VALVE MV Area (PHT): 1.37 cm MV Peak grad:  7.7 mmHg MV Mean grad:  4.0 mmHg MV Vmax:       1.39 m/s MV Vmean:      91.2 cm/s MV Decel Time: 553 msec Mihai Croitoru MD Electronically signed by Thurmon FairMihai Croitoru MD Signature Date/Time: 08/07/2021/11:39:31 AM    Final    ECHOCARDIOGRAM LIMITED  Result Date: 08/06/2021    ECHOCARDIOGRAM LIMITED REPORT   Patient Name:   Mark AkinJERRY W Dastrup Date of Exam: 08/06/2021 Medical Rec #:  [id number]       Height:       71.0 in Accession #:    [id number]      Weight:       177.2 lb Date of Birth:  04/08/1980        BSA:          2.003 m Patient Age:    41 years        BP:           146/91 mmHg Patient Gender: M               HR:           65 bpm. Exam Location:  Inpatient Procedure: Limited Echo, Cardiac Doppler, Color Doppler and 3D Echo Indications:    Mitral Valve Disorder I05.9  History:        Patient has prior history of Echocardiogram examinations, most                 recent 08/05/2021. COPD, PAD and Stroke; Risk Factors:Current                 Smoker. Ischemic cardiomyopathy.                  Mitral Valve: ON-X  27/29 MM PROSTHETIC MITRAL VALVE mechanical                 valve valve is present in the mitral position.  Sonographer:    Leta Junglingiffany Cooper RDCS Referring Phys: [phone] DALTON S MCLEAN IMPRESSIONS  1. 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 There is a ON-X 27/29 MM PROSTHETIC MITRAL VALVE mechanical valve present in the mitral position. The mean mitral valve gradient is 6.0 mmHg with average heart rate of 64 bpm.     MG significantly improved from prior TTE (MG 17mmHg at 80bpm on 08/02/21). Mobile echodensity consistent with thrombus seen on atrial side of valve measuring 2.5cmx1.2cm FINDINGS  Left Ventricle: Left ventricular ejection fraction, by estimation, is 30 to 35%. The left ventricle has moderately decreased function. The left ventricle demonstrates regional wall motion abnormalities. Mitral Valve: There is a ON-X 27/29 MM PROSTHETIC MITRAL VALVE mechanical valve present in the mitral position. The mean mitral valve gradient is 6.0 mmHg with average heart rate of 64 bpm. 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  - Continue Entresto 49/51 bid  - Continue Digoxin 0.125, level ok today.   - Continue Toprol XL 25 mg daily.  -ASA 81, crestor 40   3. Hyperkalemia - per note in chart from yesterday - Stop spironolactone 25 mg daily.  -Do not take potassium supplements   Follow up:  Follow up in 3 months or sooner if needed - may need dig level     Ivonne Andrew, NP 08/21/2021

## 2021-08-21 ENCOUNTER — Encounter: Payer: Self-pay | Admitting: Nurse Practitioner

## 2021-08-21 NOTE — Assessment & Plan Note (Signed)
2. 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  - Continue Entresto 49/51 bid  - Continue Digoxin 0.125, level ok today.   - Continue Toprol XL 25 mg daily.  -ASA 81, crestor 40   3. Hyperkalemia - per note in chart from yesterday - Stop spironolactone 25 mg daily.  -Do not take potassium supplements   Follow up:  Follow up in 3 months or sooner if needed - may need dig level

## 2021-08-22 ENCOUNTER — Other Ambulatory Visit (HOSPITAL_COMMUNITY): Payer: Self-pay

## 2021-08-23 ENCOUNTER — Ambulatory Visit (INDEPENDENT_AMBULATORY_CARE_PROVIDER_SITE_OTHER): Payer: Self-pay

## 2021-08-23 ENCOUNTER — Telehealth (HOSPITAL_COMMUNITY): Payer: Self-pay

## 2021-08-23 DIAGNOSIS — I4891 Unspecified atrial fibrillation: Secondary | ICD-10-CM

## 2021-08-23 DIAGNOSIS — Z5181 Encounter for therapeutic drug level monitoring: Secondary | ICD-10-CM

## 2021-08-23 LAB — POCT INR: INR: 5.3 — AB (ref 2.0–3.0)

## 2021-08-23 NOTE — Telephone Encounter (Signed)
Attempted to reach Mark Garrett for paramedicine home visit by calling and texting his phone. No answer on call or text. I will continue reaching out to try to schedule a home visit.

## 2021-08-23 NOTE — Patient Instructions (Addendum)
Description   Hold tomorrow's dose and Thursday's dose and then continue taking 0.5 tablet daily Recheck INR next Tuesday at [place] office.  [facility] [phone] Pt stated the best number to reach him is [phone].

## 2021-08-25 ENCOUNTER — Other Ambulatory Visit (HOSPITAL_COMMUNITY): Payer: Medicaid Other

## 2021-08-29 ENCOUNTER — Telehealth (HOSPITAL_COMMUNITY): Payer: Self-pay

## 2021-08-29 NOTE — Telephone Encounter (Signed)
Attempted to reach Mr. Mark Garrett to set up home paramedicine visit with no answer on call. He has no voicemail set up, so no message left. I did send a text message as well. I will continue to attempt to reach out.   Maralyn Sago, EMT-Paramedic [phone] 08/29/2021

## 2021-08-30 ENCOUNTER — Ambulatory Visit (INDEPENDENT_AMBULATORY_CARE_PROVIDER_SITE_OTHER): Payer: Self-pay

## 2021-08-30 DIAGNOSIS — I4891 Unspecified atrial fibrillation: Secondary | ICD-10-CM

## 2021-08-30 DIAGNOSIS — Z5181 Encounter for therapeutic drug level monitoring: Secondary | ICD-10-CM

## 2021-08-30 DIAGNOSIS — Z7901 Long term (current) use of anticoagulants: Secondary | ICD-10-CM

## 2021-08-30 DIAGNOSIS — I639 Cerebral infarction, unspecified: Secondary | ICD-10-CM

## 2021-08-30 DIAGNOSIS — Z952 Presence of prosthetic heart valve: Secondary | ICD-10-CM

## 2021-08-30 LAB — POCT INR: INR: 3.8 — AB (ref 2.0–3.0)

## 2021-08-30 NOTE — Patient Instructions (Signed)
Description   Hold tomorrow's dose and then START taking 0.5 tablet daily EXCEPT nothing on Saturdays.  Recheck INR next Tuesday at [place] office.  [facility] [phone] Pt stated the best number to reach him is [phone].

## 2021-08-31 ENCOUNTER — Telehealth (HOSPITAL_COMMUNITY): Payer: Self-pay

## 2021-08-31 ENCOUNTER — Telehealth: Payer: Self-pay

## 2021-08-31 NOTE — Telephone Encounter (Signed)
Attempted to reach Mr. Mark Garrett several times over the last few weeks with no success. He was successful at reaching his coumadin appointment yesterday. I reached out to the coumadin nurse and she and I spoke and she is going to have Mark Garrett call me if she shows to his appointment next week. I am grateful for her help. Call complete.   Maralyn Sago, EMT-Paramedic [phone] 08/31/2021

## 2021-08-31 NOTE — Telephone Encounter (Signed)
Maralyn Sago, EMT  Memory Dance, RN  Hi Cammy Copa, I am Maralyn Sago. I am a community paramedic for the AHF clinic at [facility] and I have been assigned to Mr. Taner Rzepka for outpatient follow up but have had a hard time reaching him to schedule a home visit. I have tried the number in his chart that's listed as the best contact but have had no returned calls or messages. I see he has made his coumadin appointments lately and was wondering if you could help me by having him call me next time you are with him in clinic? I work Mon-Thurs 8:00-6:00. My number is [phone]    Thanks so much for your assistance!   Maralyn Sago, EMT-Paramedic  [phone]  08/31/2021    - Called and spoke with Herbert Seta. Confirmed that I will provide the pt with her information at next anticoagulation appt.

## 2021-09-06 ENCOUNTER — Telehealth: Payer: Self-pay

## 2021-09-06 NOTE — Telephone Encounter (Signed)
Pt missed anticoagulation appt. Called, no answer. Unable to leave voicemail.

## 2021-09-13 ENCOUNTER — Telehealth: Payer: Self-pay

## 2021-09-13 NOTE — Telephone Encounter (Signed)
Pt overdue for anticoagulation visit Called, no answer. Unable to leave voicemail.

## 2021-09-14 ENCOUNTER — Telehealth: Payer: Self-pay

## 2021-09-14 ENCOUNTER — Other Ambulatory Visit (HOSPITAL_COMMUNITY): Payer: Self-pay

## 2021-09-14 NOTE — Telephone Encounter (Signed)
Requested dx correction completed and faxed per [facility] request

## 2021-09-22 ENCOUNTER — Other Ambulatory Visit (HOSPITAL_COMMUNITY): Payer: Medicaid Other

## 2021-09-27 ENCOUNTER — Other Ambulatory Visit (HOSPITAL_COMMUNITY): Payer: Self-pay | Admitting: Cardiology

## 2021-09-27 ENCOUNTER — Other Ambulatory Visit (HOSPITAL_COMMUNITY): Payer: Self-pay

## 2021-09-27 ENCOUNTER — Telehealth: Payer: Self-pay

## 2021-09-27 NOTE — Telephone Encounter (Signed)
Called pt to schedule anticoagulation appt. No answer, unable to leave voicemail.

## 2021-09-28 ENCOUNTER — Telehealth (HOSPITAL_COMMUNITY): Payer: Self-pay

## 2021-09-28 ENCOUNTER — Other Ambulatory Visit (HOSPITAL_COMMUNITY): Payer: Self-pay | Admitting: Cardiology

## 2021-09-28 ENCOUNTER — Other Ambulatory Visit: Payer: Self-pay | Admitting: *Deleted

## 2021-09-28 ENCOUNTER — Other Ambulatory Visit (HOSPITAL_COMMUNITY): Payer: Self-pay

## 2021-09-28 MED ORDER — WARFARIN SODIUM 2 MG PO TABS: ORAL_TABLET | ORAL | 0 refills | Status: DC

## 2021-09-28 NOTE — Telephone Encounter (Signed)
Called and was unable to leave patient a voice message to confirm/remind patient of their appointment at [facility] on 09/29/21.    

## 2021-09-28 NOTE — Telephone Encounter (Signed)
Attempted to reach Mr. Age to remind him of clinic appointment tomorrow however phone went straight to VM and no ability to leave a message. I will continue to reach out.   Maralyn Sago, EMT-Paramedic [phone] 09/28/2021

## 2021-09-28 NOTE — Telephone Encounter (Signed)
Pt called for an appt in [place], also pt does confirm that he is out of warfarin. Advised that will send in a short supply until appt.

## 2021-09-29 ENCOUNTER — Encounter (HOSPITAL_COMMUNITY): Payer: Medicaid Other

## 2021-09-29 ENCOUNTER — Telehealth (HOSPITAL_COMMUNITY): Payer: Self-pay | Admitting: Licensed Clinical Social Worker

## 2021-09-29 NOTE — Telephone Encounter (Signed)
Pt was "no show" to appt today.  Community Paramedic has also been unable to reach patient to complete home visit.  We will DC from Community Paramedicine at this time due to inability to communicate- can reconsider enrollment if pt reconnects with clinic.  Burna Sis, LCSW Clinical Social Worker [facility] Desk#: [phone] Cell#: [phone]

## 2021-10-04 ENCOUNTER — Telehealth: Payer: Self-pay

## 2021-10-04 NOTE — Telephone Encounter (Signed)
Pt missed anticoagulation appt. Attempted to call pt and reschedule appt. No answer and VM has not been set up.

## 2021-10-05 ENCOUNTER — Other Ambulatory Visit: Payer: Self-pay

## 2021-10-05 DIAGNOSIS — Z7901 Long term (current) use of anticoagulants: Secondary | ICD-10-CM

## 2021-10-05 MED ORDER — WARFARIN SODIUM 2 MG PO TABS: ORAL_TABLET | ORAL | 0 refills | Status: DC

## 2021-10-11 ENCOUNTER — Ambulatory Visit (INDEPENDENT_AMBULATORY_CARE_PROVIDER_SITE_OTHER): Payer: Self-pay

## 2021-10-11 ENCOUNTER — Other Ambulatory Visit: Payer: Self-pay

## 2021-10-11 DIAGNOSIS — Z952 Presence of prosthetic heart valve: Secondary | ICD-10-CM

## 2021-10-11 DIAGNOSIS — I4891 Unspecified atrial fibrillation: Secondary | ICD-10-CM

## 2021-10-11 DIAGNOSIS — Z7901 Long term (current) use of anticoagulants: Secondary | ICD-10-CM

## 2021-10-11 DIAGNOSIS — I639 Cerebral infarction, unspecified: Secondary | ICD-10-CM

## 2021-10-11 DIAGNOSIS — Z5181 Encounter for therapeutic drug level monitoring: Secondary | ICD-10-CM

## 2021-10-11 LAB — POCT INR: INR: 1.2 — AB (ref 2.0–3.0)

## 2021-10-11 MED ORDER — WARFARIN SODIUM 2 MG PO TABS: ORAL_TABLET | ORAL | 0 refills | Status: DC

## 2021-10-11 NOTE — Telephone Encounter (Signed)
Prescription refill request received for warfarin Lov: 08/18/21  Next INR check: 10/18/21 Warfarin tablet strength:2mg   Appropriate dose and refill sent to requested pharmacy.

## 2021-10-11 NOTE — Patient Instructions (Signed)
Description   Take 2 tablets today and 1.5 tablets tomorrow and then continue taking 0.5 tablet daily EXCEPT nothing on Saturdays.  Recheck INR next Tuesday at [place] office.  [facility] [phone] Pt stated the best number to reach him is [phone].

## 2021-10-18 ENCOUNTER — Ambulatory Visit (INDEPENDENT_AMBULATORY_CARE_PROVIDER_SITE_OTHER): Payer: Self-pay

## 2021-10-18 DIAGNOSIS — I4891 Unspecified atrial fibrillation: Secondary | ICD-10-CM

## 2021-10-18 DIAGNOSIS — Z5181 Encounter for therapeutic drug level monitoring: Secondary | ICD-10-CM

## 2021-10-18 LAB — POCT INR: INR: 2.1 (ref 2.0–3.0)

## 2021-10-18 NOTE — Patient Instructions (Signed)
Description   Take 1.5 tablets today and then continue taking 1 tablet daily EXCEPT nothing on Saturdays.  Recheck INR next Tuesday at [place] office.  [facility] [phone] Pt stated the best number to reach him is [phone].

## 2021-10-25 ENCOUNTER — Ambulatory Visit (INDEPENDENT_AMBULATORY_CARE_PROVIDER_SITE_OTHER): Payer: Medicaid Other

## 2021-10-25 DIAGNOSIS — I639 Cerebral infarction, unspecified: Secondary | ICD-10-CM

## 2021-10-25 DIAGNOSIS — I4891 Unspecified atrial fibrillation: Secondary | ICD-10-CM

## 2021-10-25 DIAGNOSIS — Z5181 Encounter for therapeutic drug level monitoring: Secondary | ICD-10-CM

## 2021-10-25 DIAGNOSIS — Z7901 Long term (current) use of anticoagulants: Secondary | ICD-10-CM

## 2021-10-25 DIAGNOSIS — Z952 Presence of prosthetic heart valve: Secondary | ICD-10-CM

## 2021-10-25 LAB — POCT INR: INR: 3.9 — AB (ref 2.0–3.0)

## 2021-10-25 NOTE — Patient Instructions (Signed)
Description   Only take 0.5 tablet tomorrow and then continue taking 1 tablet daily EXCEPT nothing on Saturdays.  Recheck INR next Tuesday at [place] office.  [facility] [phone] Pt stated the best number to reach him is [phone].

## 2021-11-01 ENCOUNTER — Other Ambulatory Visit (HOSPITAL_COMMUNITY): Payer: Self-pay

## 2021-11-01 ENCOUNTER — Other Ambulatory Visit (HOSPITAL_COMMUNITY): Payer: Self-pay | Admitting: Cardiology

## 2021-11-01 ENCOUNTER — Telehealth: Payer: Self-pay

## 2021-11-01 NOTE — Telephone Encounter (Signed)
Pt missed [facility] Clinic appt. Called, no answer. Unable to leave voicemail.

## 2021-11-07 ENCOUNTER — Other Ambulatory Visit: Payer: Self-pay | Admitting: Cardiology

## 2021-11-07 DIAGNOSIS — Z7901 Long term (current) use of anticoagulants: Secondary | ICD-10-CM

## 2021-11-07 NOTE — Telephone Encounter (Signed)
Received refill request for warfarin:  Last INR was 3.9 on 10/25/21 Next INR due on 11/08/21 LOV was 08/18/21  Everlean Alstrom NP  Refill approved.

## 2021-11-08 ENCOUNTER — Ambulatory Visit (INDEPENDENT_AMBULATORY_CARE_PROVIDER_SITE_OTHER): Payer: Self-pay

## 2021-11-08 DIAGNOSIS — Z7901 Long term (current) use of anticoagulants: Secondary | ICD-10-CM

## 2021-11-08 DIAGNOSIS — Z952 Presence of prosthetic heart valve: Secondary | ICD-10-CM

## 2021-11-08 DIAGNOSIS — Z5181 Encounter for therapeutic drug level monitoring: Secondary | ICD-10-CM

## 2021-11-08 DIAGNOSIS — I4891 Unspecified atrial fibrillation: Secondary | ICD-10-CM

## 2021-11-08 DIAGNOSIS — I639 Cerebral infarction, unspecified: Secondary | ICD-10-CM

## 2021-11-08 LAB — POCT INR: INR: 2.1 (ref 2.0–3.0)

## 2021-11-08 NOTE — Telephone Encounter (Signed)
This encounter was created in error - please disregard.

## 2021-11-08 NOTE — Patient Instructions (Signed)
Description   Take 1.5 tablet today and then continue taking 1 tablet daily EXCEPT nothing on Saturdays.  Recheck INR next Tuesday at [place] office.  [facility] [phone] Pt stated the best number to reach him is [phone].

## 2021-11-15 ENCOUNTER — Ambulatory Visit: Payer: Self-pay | Attending: Cardiology

## 2021-11-15 DIAGNOSIS — Z7901 Long term (current) use of anticoagulants: Secondary | ICD-10-CM

## 2021-11-15 DIAGNOSIS — I4891 Unspecified atrial fibrillation: Secondary | ICD-10-CM

## 2021-11-15 DIAGNOSIS — Z5181 Encounter for therapeutic drug level monitoring: Secondary | ICD-10-CM

## 2021-11-15 DIAGNOSIS — Z952 Presence of prosthetic heart valve: Secondary | ICD-10-CM

## 2021-11-15 DIAGNOSIS — I639 Cerebral infarction, unspecified: Secondary | ICD-10-CM

## 2021-11-15 LAB — POCT INR: INR: 1.6 — AB (ref 2.0–3.0)

## 2021-11-15 NOTE — Patient Instructions (Signed)
Description   Take 2 tablet today and 1.5 tablets tomorrow and then then continue taking 1 tablet daily EXCEPT nothing on Saturdays.  Recheck INR next Tuesday at [place] office.  [facility] [phone] Pt stated the best number to reach him is [phone].

## 2021-11-22 ENCOUNTER — Ambulatory Visit: Payer: Medicaid Other | Attending: Cardiology

## 2021-11-22 DIAGNOSIS — I639 Cerebral infarction, unspecified: Secondary | ICD-10-CM

## 2021-11-22 DIAGNOSIS — I4891 Unspecified atrial fibrillation: Secondary | ICD-10-CM

## 2021-11-22 DIAGNOSIS — Z5181 Encounter for therapeutic drug level monitoring: Secondary | ICD-10-CM

## 2021-11-22 DIAGNOSIS — Z952 Presence of prosthetic heart valve: Secondary | ICD-10-CM

## 2021-11-22 DIAGNOSIS — Z7901 Long term (current) use of anticoagulants: Secondary | ICD-10-CM

## 2021-11-22 LAB — POCT INR: INR: 2.4 (ref 2.0–3.0)

## 2021-11-22 NOTE — Patient Instructions (Signed)
Description   Take 1.5 tablets today and then continue taking 1 tablet daily EXCEPT nothing on Saturdays.  Recheck INR next Tuesday at [place] office.  [facility] [phone] Pt stated the best number to reach him is [phone].

## 2021-11-28 ENCOUNTER — Encounter (HOSPITAL_COMMUNITY): Payer: Medicaid Other | Admitting: Cardiology

## 2021-11-29 ENCOUNTER — Ambulatory Visit: Payer: Medicaid Other | Attending: Cardiology

## 2021-11-29 DIAGNOSIS — Z952 Presence of prosthetic heart valve: Secondary | ICD-10-CM

## 2021-11-29 DIAGNOSIS — Z7901 Long term (current) use of anticoagulants: Secondary | ICD-10-CM

## 2021-11-29 DIAGNOSIS — Z5181 Encounter for therapeutic drug level monitoring: Secondary | ICD-10-CM

## 2021-11-29 DIAGNOSIS — I4891 Unspecified atrial fibrillation: Secondary | ICD-10-CM

## 2021-11-29 DIAGNOSIS — I639 Cerebral infarction, unspecified: Secondary | ICD-10-CM

## 2021-11-29 LAB — POCT INR: INR: 2.7 (ref 2.0–3.0)

## 2021-11-29 NOTE — Patient Instructions (Signed)
Description   Continue taking 1 tablet daily EXCEPT nothing on Saturdays.  Recheck INR in 2 weeks on Tuesday at [place] office.  [facility] #[phone] Pt stated the best number to reach him is [phone].       

## 2021-12-13 ENCOUNTER — Ambulatory Visit: Payer: Medicaid Other | Attending: Cardiology

## 2021-12-13 DIAGNOSIS — I4891 Unspecified atrial fibrillation: Secondary | ICD-10-CM

## 2021-12-13 DIAGNOSIS — Z5181 Encounter for therapeutic drug level monitoring: Secondary | ICD-10-CM

## 2021-12-13 DIAGNOSIS — Z952 Presence of prosthetic heart valve: Secondary | ICD-10-CM

## 2021-12-13 DIAGNOSIS — Z7901 Long term (current) use of anticoagulants: Secondary | ICD-10-CM

## 2021-12-13 DIAGNOSIS — I639 Cerebral infarction, unspecified: Secondary | ICD-10-CM

## 2021-12-13 LAB — POCT INR: INR: 1.8 — AB (ref 2.0–3.0)

## 2021-12-13 NOTE — Patient Instructions (Signed)
Description   Take 2 tablets today and then continue taking 1 tablet daily EXCEPT nothing on Saturdays.  Recheck INR in 2 weeks on Tuesday at [place] office.  [facility] [phone] Pt stated the best number to reach him is [phone].

## 2021-12-27 ENCOUNTER — Ambulatory Visit: Payer: Medicaid Other | Attending: Cardiology

## 2021-12-27 DIAGNOSIS — Z7901 Long term (current) use of anticoagulants: Secondary | ICD-10-CM

## 2021-12-27 DIAGNOSIS — I4891 Unspecified atrial fibrillation: Secondary | ICD-10-CM

## 2021-12-27 DIAGNOSIS — Z5181 Encounter for therapeutic drug level monitoring: Secondary | ICD-10-CM

## 2021-12-27 DIAGNOSIS — I639 Cerebral infarction, unspecified: Secondary | ICD-10-CM

## 2021-12-27 DIAGNOSIS — Z952 Presence of prosthetic heart valve: Secondary | ICD-10-CM

## 2021-12-27 LAB — POCT INR: INR: 1.5 — AB (ref 2.0–3.0)

## 2021-12-27 NOTE — Patient Instructions (Signed)
Description   Take 2 tablets today and 1.5 tablets tomorrow and then continue taking 1 tablet daily EXCEPT nothing on Saturdays.  Recheck INR in 2 weeks on Tuesday at [place] office.  [facility] [phone] Pt stated the best number to reach him is [phone].

## 2022-01-03 ENCOUNTER — Other Ambulatory Visit: Payer: Self-pay

## 2022-01-03 ENCOUNTER — Ambulatory Visit: Payer: Medicaid Other | Attending: Cardiology

## 2022-01-03 DIAGNOSIS — Z5181 Encounter for therapeutic drug level monitoring: Secondary | ICD-10-CM

## 2022-01-03 DIAGNOSIS — Z952 Presence of prosthetic heart valve: Secondary | ICD-10-CM

## 2022-01-03 DIAGNOSIS — Z7901 Long term (current) use of anticoagulants: Secondary | ICD-10-CM

## 2022-01-03 DIAGNOSIS — I4891 Unspecified atrial fibrillation: Secondary | ICD-10-CM

## 2022-01-03 DIAGNOSIS — I639 Cerebral infarction, unspecified: Secondary | ICD-10-CM

## 2022-01-03 LAB — POCT INR: INR: 3.5 — AB (ref 2.0–3.0)

## 2022-01-03 MED ORDER — WARFARIN SODIUM 2 MG PO TABS: ORAL_TABLET | ORAL | 0 refills | Status: DC

## 2022-01-03 NOTE — Patient Instructions (Signed)
Description   Continue taking 1 tablet daily EXCEPT nothing on Saturdays.  Recheck INR in 2 weeks on Tuesday at [place] office.  [facility] [phone] Pt stated the best number to reach him is [phone].

## 2022-01-03 NOTE — Telephone Encounter (Signed)
Prescription refill request received for warfarin Lov: 08/18/21 Mark Garrett)  Next INR check: 01/17/22 Warfarin tablet strength: 2mg   Appropriate dose and refill sent to requested pharmacy.

## 2022-01-11 ENCOUNTER — Ambulatory Visit (HOSPITAL_COMMUNITY)
Admission: RE | Admit: 2022-01-11 | Discharge: 2022-01-11 | Disposition: A | Discharge status: Home / Self Care | Payer: Medicaid Other | Source: Ambulatory Visit | Attending: Cardiology | Admitting: Cardiology

## 2022-01-11 ENCOUNTER — Other Ambulatory Visit (HOSPITAL_COMMUNITY): Payer: Self-pay

## 2022-01-11 VITALS — BP 200/120 | HR 96 | Wt 179.6 lb

## 2022-01-11 DIAGNOSIS — I5022 Chronic systolic (congestive) heart failure: Secondary | ICD-10-CM | POA: Insufficient documentation

## 2022-01-11 LAB — BASIC METABOLIC PANEL
Anion gap: 7 (ref 5–15)
BUN: 17 mg/dL (ref 6–20)
CO2: 26 mmol/L (ref 22–32)
Calcium: 9 mg/dL (ref 8.9–10.3)
Chloride: 106 mmol/L (ref 98–111)
Creatinine, Ser: 1.16 mg/dL (ref 0.61–1.24)
GFR, Estimated: >60 mL/min (ref >60)
Glucose, Bld: 75 mg/dL (ref 70–99)
Potassium: 4.5 mmol/L (ref 3.5–5.1)
Sodium: 139 mmol/L (ref 135–145)

## 2022-01-11 MED ORDER — METOPROLOL SUCCINATE ER 25 MG PO TB24
25.0000 mg | ORAL_TABLET | Freq: Every day | ORAL | 3 refills | Status: DC
  Filled 2022-01-11: qty 30, 30d supply, fill #0

## 2022-01-11 MED ORDER — ENTRESTO 49-51 MG PO TABS: 1.0000 | ORAL_TABLET | Freq: Two times a day (BID) | ORAL | 11 refills | Status: DC

## 2022-01-11 MED ORDER — ROSUVASTATIN CALCIUM 40 MG PO TABS
40.0000 mg | ORAL_TABLET | Freq: Every day | ORAL | 3 refills | Status: DC
  Filled 2022-01-11: qty 30, 30d supply, fill #0

## 2022-01-11 MED ORDER — ASPIRIN 81 MG PO CHEW
81.0000 mg | CHEWABLE_TABLET | Freq: Every day | ORAL | 3 refills | Status: DC
  Filled 2022-01-11: qty 30, 30d supply, fill #0

## 2022-01-11 NOTE — Patient Instructions (Addendum)
RESTART Asprin 81 mg daily.  RESTART Farxiga 10 mg daily.  RESTART Crestor 40 mg daily.  RESTART Entresto 49/51 Twice daily PLEASE CALL [facility] SO THEY CAN DELIVER THIS MEDICATION.  Labs done today, your results will be available in MyChart, we will contact you for abnormal readings.  Repeat blood work in 10 days in [place] recommends that you schedule a follow-up appointment in: 3 weeks   If you have any questions or concerns before your next appointment please send [place] a message through [place] or call our office at [phone].    TO LEAVE A MESSAGE FOR THE NURSE SELECT OPTION 2, PLEASE LEAVE A MESSAGE INCLUDING: YOUR NAME DATE OF BIRTH CALL BACK NUMBER REASON FOR CALL**this is important as we prioritize the call backs  YOU WILL RECEIVE A CALL BACK THE SAME DAY AS LONG AS YOU CALL BEFORE 4:00 PM  At the [facility], you and your health needs are our priority. As part of our continuing mission to provide you with exceptional heart care, we have created designated Provider Care Teams. These Care Teams include your primary Cardiologist (physician) and Advanced Practice Providers (APPs- Physician Assistants and Nurse Practitioners) who all work together to provide you with the care you need, when you need it.   You may see any of the following providers on your designated Care Team at your next follow up: Dr Glori Bickers Dr Loralie Champagne Dr. Roxana Hires, NP Lyda Jester, [place] [facility] [place], [place] Forestine Na, NP Audry Riles, PharmD   Please be sure to bring in all your medications bottles to every appointment.

## 2022-01-11 NOTE — Progress Notes (Signed)
Medication Samples have been provided to the patient.  Drug name: Delene Loll        Strength: 49/51mg         Qty: 2  LOT: [id number]  Exp.Date: 8/25  Dosing instructions: Take 1 tablet twice daily  The patient has been instructed regarding the correct time, dose, and frequency of taking this medication, including desired effects and most common side effects.   Medication Samples have been provided to the patient.  Drug name: Wilder Glade       Strength: 10mg         Qty: 4  LOT: [id number]  Exp.Date: 07/17/24  Dosing instructions: Take 1 daily  The patient has been instructed regarding the correct time, dose, and frequency of taking this medication, including desired effects and most common side effects.   Lelan Pons Arine Foley 2:43 PM 01/11/2022  Asencion Noble 2:41 PM 01/11/2022

## 2022-01-12 ENCOUNTER — Other Ambulatory Visit (HOSPITAL_COMMUNITY): Payer: Self-pay
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
## 2022-01-13 ENCOUNTER — Other Ambulatory Visit (HOSPITAL_COMMUNITY): Payer: Self-pay | Admitting: *Deleted

## 2022-01-13 ENCOUNTER — Other Ambulatory Visit (HOSPITAL_COMMUNITY): Payer: Self-pay

## 2022-01-13 MED ORDER — SILDENAFIL CITRATE 20 MG PO TABS
40.0000 mg | ORAL_TABLET | Freq: Three times a day (TID) | ORAL | 1 refills | Status: DC
  Filled 2022-01-13: qty 180, 30d supply, fill #0

## 2022-01-16 ENCOUNTER — Other Ambulatory Visit (HOSPITAL_COMMUNITY): Payer: Self-pay

## 2022-01-17 ENCOUNTER — Ambulatory Visit: Payer: Medicaid Other | Attending: Cardiology

## 2022-01-17 DIAGNOSIS — Z5181 Encounter for therapeutic drug level monitoring: Secondary | ICD-10-CM

## 2022-01-17 DIAGNOSIS — I4891 Unspecified atrial fibrillation: Secondary | ICD-10-CM

## 2022-01-17 LAB — POCT INR: INR: 1.5 — AB (ref 2.0–3.0)

## 2022-01-17 NOTE — Patient Instructions (Signed)
Description   Take 2 tablets today and 1.5 tablets tomorrow and then continue taking 1 tablet daily EXCEPT nothing on Saturdays.  Recheck INR in 1 weeks on Tuesday at [place] office.  Coumadin Clinic [phone] Pt stated the best number to reach him is [phone].

## 2022-01-24 ENCOUNTER — Ambulatory Visit: Payer: Medicaid Other | Attending: Cardiology

## 2022-01-24 DIAGNOSIS — Z952 Presence of prosthetic heart valve: Secondary | ICD-10-CM

## 2022-01-24 DIAGNOSIS — Z7901 Long term (current) use of anticoagulants: Secondary | ICD-10-CM

## 2022-01-24 DIAGNOSIS — I4891 Unspecified atrial fibrillation: Secondary | ICD-10-CM

## 2022-01-24 DIAGNOSIS — Z5181 Encounter for therapeutic drug level monitoring: Secondary | ICD-10-CM

## 2022-01-24 DIAGNOSIS — I639 Cerebral infarction, unspecified: Secondary | ICD-10-CM

## 2022-01-24 LAB — POCT INR: INR: 2.1 (ref 2.0–3.0)

## 2022-01-24 NOTE — Patient Instructions (Addendum)
Description   Take 1.5 tablets today and then START taking 1 tablet daily EXCEPT 0.5 tablet on Saturdays.  Recheck INR in 1 week on Tuesday at [place] office.  Coumadin Clinic [phone] Pt stated the best number to reach him is [phone].

## 2022-01-27 ENCOUNTER — Other Ambulatory Visit (HOSPITAL_COMMUNITY): Payer: Self-pay

## 2022-01-31 ENCOUNTER — Ambulatory Visit: Payer: Medicaid Other | Attending: Cardiology

## 2022-01-31 DIAGNOSIS — Z7901 Long term (current) use of anticoagulants: Secondary | ICD-10-CM

## 2022-01-31 DIAGNOSIS — I4891 Unspecified atrial fibrillation: Secondary | ICD-10-CM

## 2022-01-31 DIAGNOSIS — Z5181 Encounter for therapeutic drug level monitoring: Secondary | ICD-10-CM

## 2022-01-31 DIAGNOSIS — Z952 Presence of prosthetic heart valve: Secondary | ICD-10-CM

## 2022-01-31 DIAGNOSIS — I639 Cerebral infarction, unspecified: Secondary | ICD-10-CM

## 2022-01-31 LAB — POCT INR: INR: 2.2 (ref 2.0–3.0)

## 2022-01-31 NOTE — Patient Instructions (Signed)
Description   Take 1.5 tablets today and then START taking 1 tablet daily.  Recheck INR in 1 week on Tuesday at [place] office.  Coumadin Clinic [phone] Pt stated the best number to reach him is [phone].

## 2022-02-02 ENCOUNTER — Encounter (HOSPITAL_COMMUNITY): Payer: Self-pay

## 2022-02-02 ENCOUNTER — Telehealth (HOSPITAL_COMMUNITY): Payer: Self-pay | Admitting: Pharmacy Technician

## 2022-02-02 ENCOUNTER — Other Ambulatory Visit (HOSPITAL_COMMUNITY): Payer: Self-pay

## 2022-02-02 ENCOUNTER — Ambulatory Visit (HOSPITAL_COMMUNITY)
Admission: RE | Admit: 2022-02-02 | Discharge: 2022-02-02 | Disposition: A | Discharge status: Home / Self Care | Payer: Medicaid Other | Source: Ambulatory Visit | Attending: Family Medicine | Admitting: Family Medicine

## 2022-02-02 VITALS — BP 138/84 | HR 68 | Wt 184.4 lb

## 2022-02-02 DIAGNOSIS — Z8673 Personal history of transient ischemic attack (TIA), and cerebral infarction without residual deficits: Secondary | ICD-10-CM | POA: Insufficient documentation

## 2022-02-02 DIAGNOSIS — Z91148 Patient's other noncompliance with medication regimen for other reason: Secondary | ICD-10-CM | POA: Insufficient documentation

## 2022-02-02 DIAGNOSIS — Z7901 Long term (current) use of anticoagulants: Secondary | ICD-10-CM | POA: Insufficient documentation

## 2022-02-02 DIAGNOSIS — F17210 Nicotine dependence, cigarettes, uncomplicated: Secondary | ICD-10-CM | POA: Insufficient documentation

## 2022-02-02 DIAGNOSIS — Z139 Encounter for screening, unspecified: Secondary | ICD-10-CM

## 2022-02-02 DIAGNOSIS — I255 Ischemic cardiomyopathy: Secondary | ICD-10-CM | POA: Insufficient documentation

## 2022-02-02 DIAGNOSIS — Z8679 Personal history of other diseases of the circulatory system: Secondary | ICD-10-CM

## 2022-02-02 DIAGNOSIS — Z86718 Personal history of other venous thrombosis and embolism: Secondary | ICD-10-CM | POA: Insufficient documentation

## 2022-02-02 DIAGNOSIS — I059 Rheumatic mitral valve disease, unspecified: Secondary | ICD-10-CM | POA: Insufficient documentation

## 2022-02-02 DIAGNOSIS — I5022 Chronic systolic (congestive) heart failure: Secondary | ICD-10-CM | POA: Insufficient documentation

## 2022-02-02 DIAGNOSIS — Z952 Presence of prosthetic heart valve: Secondary | ICD-10-CM | POA: Insufficient documentation

## 2022-02-02 DIAGNOSIS — Z79899 Other long term (current) drug therapy: Secondary | ICD-10-CM | POA: Insufficient documentation

## 2022-02-02 DIAGNOSIS — I2721 Secondary pulmonary arterial hypertension: Secondary | ICD-10-CM

## 2022-02-02 DIAGNOSIS — Z951 Presence of aortocoronary bypass graft: Secondary | ICD-10-CM | POA: Insufficient documentation

## 2022-02-02 DIAGNOSIS — F1010 Alcohol abuse, uncomplicated: Secondary | ICD-10-CM

## 2022-02-02 DIAGNOSIS — Z041 Encounter for examination and observation following transport accident: Secondary | ICD-10-CM | POA: Insufficient documentation

## 2022-02-02 DIAGNOSIS — I739 Peripheral vascular disease, unspecified: Secondary | ICD-10-CM | POA: Insufficient documentation

## 2022-02-02 DIAGNOSIS — Z720 Tobacco use: Secondary | ICD-10-CM

## 2022-02-02 DIAGNOSIS — I2510 Atherosclerotic heart disease of native coronary artery without angina pectoris: Secondary | ICD-10-CM | POA: Insufficient documentation

## 2022-02-02 DIAGNOSIS — S98919A Complete traumatic amputation of unspecified foot, level unspecified, initial encounter: Secondary | ICD-10-CM

## 2022-02-02 DIAGNOSIS — I871 Compression of vein: Secondary | ICD-10-CM

## 2022-02-02 LAB — BASIC METABOLIC PANEL
Anion gap: 6 (ref 5–15)
BUN: 20 mg/dL (ref 6–20)
CO2: 26 mmol/L (ref 22–32)
Calcium: 8.8 mg/dL — ABNORMAL LOW (ref 8.9–10.3)
Chloride: 109 mmol/L (ref 98–111)
Creatinine, Ser: 1.25 mg/dL — ABNORMAL HIGH (ref 0.61–1.24)
GFR, Estimated: >60 mL/min (ref >60)
Glucose, Bld: 100 mg/dL — ABNORMAL HIGH (ref 70–99)
Potassium: 4.5 mmol/L (ref 3.5–5.1)
Sodium: 141 mmol/L (ref 135–145)

## 2022-02-02 MED ORDER — METOPROLOL SUCCINATE ER 25 MG PO TB24
25.0000 mg | ORAL_TABLET | Freq: Every day | ORAL | 3 refills | Status: DC
  Filled 2022-02-02: qty 30, 30d supply, fill #0

## 2022-02-02 MED ORDER — ROSUVASTATIN CALCIUM 40 MG PO TABS
40.0000 mg | ORAL_TABLET | Freq: Every day | ORAL | 3 refills | Status: DC
  Filled 2022-02-02: qty 30, 30d supply, fill #0

## 2022-02-02 MED ORDER — DIGOXIN 125 MCG PO TABS
0.1250 mg | ORAL_TABLET | Freq: Every day | ORAL | 3 refills | Status: DC
  Filled 2022-02-02: qty 30, 30d supply, fill #0

## 2022-02-02 MED ORDER — EMPAGLIFLOZIN 10 MG PO TABS
10.0000 mg | ORAL_TABLET | Freq: Every day | ORAL | 6 refills | Status: DC
  Filled 2022-02-02: qty 30, 30d supply, fill #0

## 2022-02-02 MED ORDER — SPIRONOLACTONE 25 MG PO TABS
25.0000 mg | ORAL_TABLET | Freq: Every day | ORAL | 3 refills | Status: DC
  Filled 2022-02-02: qty 30, 30d supply, fill #0

## 2022-02-02 MED ORDER — SILDENAFIL CITRATE 20 MG PO TABS: 40.0000 mg | ORAL_TABLET | Freq: Three times a day (TID) | ORAL | 0 refills | Status: DC

## 2022-02-02 MED ORDER — SILDENAFIL CITRATE 20 MG PO TABS
40.0000 mg | ORAL_TABLET | Freq: Three times a day (TID) | ORAL | 6 refills | Status: DC
  Filled 2022-02-02: qty 180, 30d supply, fill #0

## 2022-02-02 NOTE — Telephone Encounter (Signed)
Advanced Heart Failure Patient Advocate Encounter  Patient seen in clinic today and needs to start patient assistance for Farxiga. He does not have a medicaid denial letter and has family planning medicaid. Will start Jardiance application and make the switch on his medication list.

## 2022-02-02 NOTE — Telephone Encounter (Signed)
Advanced Heart Failure Patient Advocate Encounter  Application for Jardiance faxed to [facility] on 02/02/2022. Application form attached to patient chart.  Burnell Blanks, CPhT Rx Patient Advocate Phone: [phone]

## 2022-02-02 NOTE — Patient Instructions (Addendum)
RESTART Sildenafil 40 mg three times daily STOP Farxiga START Jardiance 10 mg one tab daily   Labs today We will only contact you if something comes back abnormal or we need to make some changes. Otherwise no news is good news!   You have been referred to [facility] for further CHF management in the comfort of your home. A team member will be in contact with you to arrange a home visit  Your physician recommends that you schedule a follow-up appointment in: 6 weeks  in the Advanced Practitioners (PA/NP) Clinic and in 3-4 months with Dr Shirlee Latch   Do the following things EVERYDAY: Weigh yourself in the morning before breakfast. Write it down and keep it in a log. Take your medicines as prescribed Eat low salt foods--Limit salt (sodium) to 2000 mg per day.  Stay as active as you can everyday Limit all fluids for the day to less than 2 liters   At the Advanced Heart Failure Clinic, you and your health needs are our priority. As part of our continuing mission to provide you with exceptional heart care, we have created designated Provider Care Teams. These Care Teams include your primary Cardiologist (physician) and Advanced Practice Providers (APPs- Physician Assistants and Nurse Practitioners) who all work together to provide you with the care you need, when you need it.   You may see any of the following providers on your designated Care Team at your next follow up: Dr Arvilla Meres Dr Marca Ancona Dr. Marcos Eke, NP Robbie Lis, [place] [facility] [place], [place] Brynda Peon, NP Karle Plumber, PharmD   Please be sure to bring in all your medications bottles to every appointment.   If you have any questions or concerns before your next appointment please send [place] a message through [place] or call our office at [phone].    TO LEAVE A MESSAGE FOR THE NURSE SELECT OPTION 2, PLEASE LEAVE A MESSAGE INCLUDING: YOUR NAME DATE OF  BIRTH CALL BACK NUMBER REASON FOR CALL**this is important as we prioritize the call backs  YOU WILL RECEIVE A CALL BACK THE SAME DAY AS LONG AS YOU CALL BEFORE 4:00 PM
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
## 2022-02-03 ENCOUNTER — Telehealth (HOSPITAL_COMMUNITY): Payer: Self-pay | Admitting: Licensed Clinical Social Worker

## 2022-02-03 ENCOUNTER — Other Ambulatory Visit (HOSPITAL_COMMUNITY): Payer: Self-pay

## 2022-02-03 NOTE — Telephone Encounter (Signed)
CSW consulted to add pt on paramedicine program.  Unsure if pt can be [place]- attempted to call pt to discuss but phone went straight to VM- left message requesting return call  Burna Sis, LCSW Clinical Social Worker [facility] Desk#: [phone] Cell#: [phone]

## 2022-02-06 ENCOUNTER — Telehealth (HOSPITAL_COMMUNITY): Payer: Self-pay | Admitting: Licensed Clinical Social Worker

## 2022-02-06 NOTE — Telephone Encounter (Signed)
Advanced Heart Failure Patient Advocate Encounter  [facility] cares would like for the patient to provide POI in order to continue processing his application. Called and left the patient a message.

## 2022-02-06 NOTE — Telephone Encounter (Signed)
CSW attempted to call pt to discuss physician recommendation for paramedicine.  If pt address in system is correct pt will not be able to be added to paramedicine due to location (lives in [facility]) but will confirm with patient when able- was unable to reach pt and had to leave VM requesting return call.  Burna Sis, LCSW Clinical Social Worker [facility] Desk#: [phone] Cell#: [phone]

## 2022-02-07 ENCOUNTER — Ambulatory Visit: Payer: Medicaid Other | Attending: Cardiology

## 2022-02-07 DIAGNOSIS — I639 Cerebral infarction, unspecified: Secondary | ICD-10-CM

## 2022-02-07 DIAGNOSIS — Z7901 Long term (current) use of anticoagulants: Secondary | ICD-10-CM

## 2022-02-07 DIAGNOSIS — Z5181 Encounter for therapeutic drug level monitoring: Secondary | ICD-10-CM

## 2022-02-07 DIAGNOSIS — Z952 Presence of prosthetic heart valve: Secondary | ICD-10-CM

## 2022-02-07 DIAGNOSIS — I4891 Unspecified atrial fibrillation: Secondary | ICD-10-CM

## 2022-02-07 LAB — POCT INR: INR: 2.1 (ref 2.0–3.0)

## 2022-02-07 NOTE — Patient Instructions (Signed)
Description   Take 2 tablets today and then START taking 1 tablet daily EXCEPT 1.5 tablets on Sundays.  Recheck INR in 1 week on Tuesday at [place] office.  Coumadin Clinic [phone] Pt stated the best number to reach him is [phone].

## 2022-02-14 ENCOUNTER — Ambulatory Visit: Payer: Medicaid Other | Attending: Cardiology

## 2022-02-14 DIAGNOSIS — I639 Cerebral infarction, unspecified: Secondary | ICD-10-CM

## 2022-02-14 DIAGNOSIS — Z5181 Encounter for therapeutic drug level monitoring: Secondary | ICD-10-CM

## 2022-02-14 DIAGNOSIS — Z952 Presence of prosthetic heart valve: Secondary | ICD-10-CM

## 2022-02-14 DIAGNOSIS — Z7901 Long term (current) use of anticoagulants: Secondary | ICD-10-CM

## 2022-02-14 DIAGNOSIS — I4891 Unspecified atrial fibrillation: Secondary | ICD-10-CM

## 2022-02-14 LAB — POCT INR: INR: 2.7 (ref 2.0–3.0)

## 2022-02-14 NOTE — Patient Instructions (Signed)
Description   Continue taking 1 tablet daily EXCEPT 1.5 tablets on Sundays.  Recheck INR in 2 weeks on Tuesday at [place] office.  Coumadin Clinic [phone] Pt stated the best number to reach him is [phone].

## 2022-02-15 ENCOUNTER — Telehealth (HOSPITAL_COMMUNITY): Payer: Self-pay | Admitting: Licensed Clinical Social Worker

## 2022-02-15 NOTE — Telephone Encounter (Signed)
CSW has been unable to contact patient regarding paramedicine referral.  Patient would not be eligible for paramedicine as he is not a [place] resident but unable to reach patient to inform him of this.  CSW will stop attempts to contact patient at this time.  Burna Sis, LCSW Clinical Social Worker [facility] Desk#: [phone] Cell#: [phone]

## 2022-02-21 ENCOUNTER — Other Ambulatory Visit (HOSPITAL_COMMUNITY): Payer: Self-pay

## 2022-02-21 NOTE — Telephone Encounter (Signed)
Advanced Heart Failure Patient Advocate Encounter  Patient is now covered under a Medicaid plan.   Prior authorization is required for Jardiance. PA submitted and APPROVED on 02/21/2022.  [place] Tracks Mark Garrett [id number] W Effective: 02/21/2022 - 02/21/2023  Test billing returns $4 copay for this medication, patient is already established for delivery from [facility].

## 2022-02-28 ENCOUNTER — Ambulatory Visit: Payer: Medicaid Other | Attending: Cardiology

## 2022-02-28 DIAGNOSIS — Z5181 Encounter for therapeutic drug level monitoring: Secondary | ICD-10-CM | POA: Insufficient documentation

## 2022-02-28 DIAGNOSIS — I4891 Unspecified atrial fibrillation: Secondary | ICD-10-CM | POA: Insufficient documentation

## 2022-02-28 LAB — POCT INR: INR: 4.3 — AB (ref 2.0–3.0)

## 2022-02-28 NOTE — Patient Instructions (Signed)
Description   HOLD today's dose and then continue taking 1 tablet daily EXCEPT 1.5 tablets on Sundays.  Recheck INR in 1 week on Tuesday at [place] office.  Coumadin Clinic [phone] Pt stated the best number to reach him is [phone].

## 2022-03-07 ENCOUNTER — Ambulatory Visit: Payer: Medicaid Other | Attending: Cardiology

## 2022-03-07 DIAGNOSIS — Z5181 Encounter for therapeutic drug level monitoring: Secondary | ICD-10-CM

## 2022-03-07 DIAGNOSIS — I4891 Unspecified atrial fibrillation: Secondary | ICD-10-CM | POA: Diagnosis not present

## 2022-03-07 LAB — POCT INR: INR: 2.9 (ref 2.0–3.0)

## 2022-03-07 NOTE — Patient Instructions (Signed)
Description   Continue taking 1 tablet daily EXCEPT 1.5 tablets on Sundays.  Recheck INR in 2 weeks on Tuesday at [place] office.  Coumadin Clinic #[phone] Pt stated the best number to reach him is [phone].       

## 2022-03-14 ENCOUNTER — Other Ambulatory Visit: Payer: Self-pay

## 2022-03-14 DIAGNOSIS — Z7901 Long term (current) use of anticoagulants: Secondary | ICD-10-CM

## 2022-03-14 MED ORDER — WARFARIN SODIUM 2 MG PO TABS: ORAL_TABLET | ORAL | 0 refills | Status: DC

## 2022-03-17 ENCOUNTER — Other Ambulatory Visit: Payer: Self-pay

## 2022-03-17 ENCOUNTER — Encounter (HOSPITAL_COMMUNITY): Payer: Self-pay

## 2022-03-17 ENCOUNTER — Telehealth (HOSPITAL_COMMUNITY): Payer: Self-pay

## 2022-03-17 ENCOUNTER — Ambulatory Visit (HOSPITAL_COMMUNITY)
Admission: RE | Admit: 2022-03-17 | Discharge: 2022-03-17 | Disposition: A | Discharge status: Home / Self Care | Payer: Medicaid Other | Source: Ambulatory Visit | Attending: Adult Health | Admitting: Adult Health

## 2022-03-17 ENCOUNTER — Other Ambulatory Visit (HOSPITAL_COMMUNITY): Payer: Self-pay

## 2022-03-17 VITALS — BP 152/100 | HR 66 | Wt 187.4 lb

## 2022-03-17 DIAGNOSIS — I5022 Chronic systolic (congestive) heart failure: Secondary | ICD-10-CM | POA: Diagnosis present

## 2022-03-17 DIAGNOSIS — Z79899 Other long term (current) drug therapy: Secondary | ICD-10-CM | POA: Insufficient documentation

## 2022-03-17 DIAGNOSIS — I739 Peripheral vascular disease, unspecified: Secondary | ICD-10-CM | POA: Diagnosis not present

## 2022-03-17 DIAGNOSIS — Z91148 Patient's other noncompliance with medication regimen for other reason: Secondary | ICD-10-CM | POA: Diagnosis not present

## 2022-03-17 DIAGNOSIS — Z951 Presence of aortocoronary bypass graft: Secondary | ICD-10-CM | POA: Diagnosis not present

## 2022-03-17 DIAGNOSIS — I2510 Atherosclerotic heart disease of native coronary artery without angina pectoris: Secondary | ICD-10-CM | POA: Diagnosis not present

## 2022-03-17 DIAGNOSIS — Z952 Presence of prosthetic heart valve: Secondary | ICD-10-CM | POA: Diagnosis not present

## 2022-03-17 DIAGNOSIS — Z5986 Financial insecurity: Secondary | ICD-10-CM | POA: Diagnosis not present

## 2022-03-17 DIAGNOSIS — F1010 Alcohol abuse, uncomplicated: Secondary | ICD-10-CM | POA: Diagnosis not present

## 2022-03-17 DIAGNOSIS — Z89432 Acquired absence of left foot: Secondary | ICD-10-CM | POA: Insufficient documentation

## 2022-03-17 DIAGNOSIS — Z8673 Personal history of transient ischemic attack (TIA), and cerebral infarction without residual deficits: Secondary | ICD-10-CM | POA: Insufficient documentation

## 2022-03-17 DIAGNOSIS — Z8249 Family history of ischemic heart disease and other diseases of the circulatory system: Secondary | ICD-10-CM | POA: Insufficient documentation

## 2022-03-17 DIAGNOSIS — Z7982 Long term (current) use of aspirin: Secondary | ICD-10-CM | POA: Insufficient documentation

## 2022-03-17 DIAGNOSIS — Z86718 Personal history of other venous thrombosis and embolism: Secondary | ICD-10-CM | POA: Diagnosis not present

## 2022-03-17 DIAGNOSIS — I255 Ischemic cardiomyopathy: Secondary | ICD-10-CM | POA: Insufficient documentation

## 2022-03-17 DIAGNOSIS — Z7901 Long term (current) use of anticoagulants: Secondary | ICD-10-CM | POA: Diagnosis not present

## 2022-03-17 DIAGNOSIS — F17210 Nicotine dependence, cigarettes, uncomplicated: Secondary | ICD-10-CM | POA: Insufficient documentation

## 2022-03-17 DIAGNOSIS — I340 Nonrheumatic mitral (valve) insufficiency: Secondary | ICD-10-CM | POA: Insufficient documentation

## 2022-03-17 DIAGNOSIS — I10 Essential (primary) hypertension: Secondary | ICD-10-CM

## 2022-03-17 DIAGNOSIS — Z720 Tobacco use: Secondary | ICD-10-CM

## 2022-03-17 MED ORDER — EMPAGLIFLOZIN 10 MG PO TABS
10.0000 mg | ORAL_TABLET | Freq: Every day | ORAL | 6 refills | Status: DC
  Filled 2022-03-17: qty 30, 30d supply, fill #0

## 2022-03-17 MED ORDER — ROSUVASTATIN CALCIUM 40 MG PO TABS
40.0000 mg | ORAL_TABLET | Freq: Every day | ORAL | 3 refills | Status: DC
  Filled 2022-03-17: qty 90, 90d supply, fill #0
  Filled 2022-06-12: qty 90, 90d supply, fill #1
  Filled 2022-09-12: qty 90, 90d supply, fill #2
  Filled 2022-12-18: qty 90, 90d supply, fill #3

## 2022-03-17 MED ORDER — ASPIRIN 81 MG PO CHEW
81.0000 mg | CHEWABLE_TABLET | Freq: Every day | ORAL | 3 refills | Status: AC
  Filled 2022-03-17 – 2022-09-12 (×3): qty 90, 90d supply, fill #0
  Filled 2022-11-24: qty 90, 90d supply, fill #1
  Filled 2023-02-20: qty 90, 90d supply, fill #2

## 2022-03-17 MED ORDER — SILDENAFIL CITRATE 20 MG PO TABS
40.0000 mg | ORAL_TABLET | Freq: Three times a day (TID) | ORAL | 6 refills | Status: DC
  Filled 2022-03-17 – 2022-04-19 (×2): qty 180, 30d supply, fill #0
  Filled 2022-05-30 – 2022-06-06 (×2): qty 180, 30d supply, fill #1
  Filled 2022-07-25 (×2): qty 180, 30d supply, fill #2
  Filled 2022-09-12: qty 180, 30d supply, fill #3
  Filled 2022-10-25: qty 180, 30d supply, fill #4
  Filled 2022-11-24: qty 180, 30d supply, fill #5
  Filled 2023-02-01 (×2): qty 180, 30d supply, fill #6

## 2022-03-17 MED ORDER — METOPROLOL SUCCINATE ER 25 MG PO TB24
25.0000 mg | ORAL_TABLET | Freq: Every day | ORAL | 3 refills | Status: DC
  Filled 2022-03-17: qty 90, 90d supply, fill #0
  Filled 2022-06-12: qty 90, 90d supply, fill #1
  Filled 2022-08-22 – 2022-09-12 (×2): qty 90, 90d supply, fill #2
  Filled 2022-12-18: qty 90, 90d supply, fill #3

## 2022-03-17 MED ORDER — WARFARIN SODIUM 2 MG PO TABS
2.0000 mg | ORAL_TABLET | Freq: Every day | ORAL | 6 refills | Status: DC
  Filled 2022-03-17: qty 30, 30d supply, fill #0

## 2022-03-17 MED ORDER — DIGOXIN 125 MCG PO TABS
0.1250 mg | ORAL_TABLET | Freq: Every day | ORAL | 3 refills | Status: DC
  Filled 2022-03-17: qty 30, 30d supply, fill #0
  Filled 2022-04-19: qty 30, 30d supply, fill #1

## 2022-03-17 NOTE — Patient Instructions (Addendum)
RESTART Entresto 49/51 mg one tab twice a day -Novartis refill [phone]  Labs needed one week after restarting entresto -see hard script for order  Your physician recommends that you schedule a follow-up appointment in: 4 weeks  in the Advanced Practitioners (PA/NP) Clinic  And in 8 weeks with Dr Shirlee Latch   Do the following things EVERYDAY: Weigh yourself in the morning before breakfast. Write it down and keep it in a log. Take your medicines as prescribed Eat low salt foods--Limit salt (sodium) to 2000 mg per day.  Stay as active as you can everyday Limit all fluids for the day to less than 2 liters   At the [facility] Clinic, you and your health needs are our priority. As part of our continuing mission to provide you with exceptional heart care, we have created designated Provider Care Teams. These Care Teams include your primary Cardiologist (physician) and Advanced Practice Providers (APPs- Physician Assistants and Nurse Practitioners) who all work together to provide you with the care you need, when you need it.   You may see any of the following providers on your designated Care Team at your next follow up: Dr Arvilla Meres Dr Marca Ancona Dr. Marcos Eke, NP Robbie Lis, [place] [facility] [place], [place] Brynda Peon, NP Karle Plumber, PharmD   Please be sure to bring in all your medications bottles to every appointment.    If you have any questions or concerns before your next appointment please send [place] a message through [place] or call our office at [phone].    TO LEAVE A MESSAGE FOR THE NURSE SELECT OPTION 2, PLEASE LEAVE A MESSAGE INCLUDING: YOUR NAME DATE OF BIRTH CALL BACK NUMBER REASON FOR CALL**this is important as we prioritize the call backs  YOU WILL RECEIVE A CALL BACK THE SAME DAY AS LONG AS YOU CALL BEFORE 4:00 PM

## 2022-03-17 NOTE — Telephone Encounter (Signed)
Advanced Heart Failure Patient Advocate Encounter  Prior authorization is required for Entresto. PA submitted and APPROVED on 03/17/22.  [place] Tracks: [id number] W Effective: 03/17/22 - 03/17/23  Wende Bushy Rx Patient Advocate Phone: [phone]

## 2022-03-17 NOTE — Progress Notes (Signed)
CSW consulted to help figure out where pt needs to have prescriptions sent since he now has Medicaid- had been having medications mailed out through [place] and the HF fund.  CSW met with pt who states he was unaware that prescriptions were sent to a local [facility] last time and therefore has not had his medications.  CSW discussed that with insurance he could go to local pharmacy or continue to get mail order through [facility].  Pt wanting to continue to have medications mailed through [facility]- staff informed and will send in prescriptions there.  CSW called [facility] pharmacy to confirm they received and are processing to mail out- they state they are filling now and will mail out today or tomorrow.  Pt provided with [facility] pharmacy number to follow up when medications get low.  Jorge Ny, LCSW Clinical Social Worker [facility] Desk#: [phone] Cell#: [phone]
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
## 2022-03-21 ENCOUNTER — Ambulatory Visit: Payer: Medicaid Other | Attending: Cardiology

## 2022-03-21 DIAGNOSIS — I4891 Unspecified atrial fibrillation: Secondary | ICD-10-CM | POA: Insufficient documentation

## 2022-03-21 DIAGNOSIS — Z5181 Encounter for therapeutic drug level monitoring: Secondary | ICD-10-CM | POA: Diagnosis present

## 2022-03-21 LAB — POCT INR: INR: 2.7 (ref 2.0–3.0)

## 2022-03-21 NOTE — Patient Instructions (Signed)
Description   Continue taking 1 tablet daily EXCEPT 1.5 tablets on Sundays.  Recheck INR in 3 weeks on Tuesday at [place] office.  [facility] [phone] Pt stated the best number to reach him is [phone].

## 2022-03-22 ENCOUNTER — Telehealth (HOSPITAL_COMMUNITY): Payer: Self-pay | Admitting: Licensed Clinical Social Worker

## 2022-03-22 NOTE — Telephone Encounter (Signed)
CSW attempted to call pt to see if he received his medications in the mail- unable to reach- phone went straight to VM left message requesting return call  Jorge Ny, [place] Worker [facility] Desk#: [phone] Cell#: [phone]

## 2022-03-23 ENCOUNTER — Telehealth (HOSPITAL_COMMUNITY): Payer: Self-pay

## 2022-03-23 ENCOUNTER — Other Ambulatory Visit (HOSPITAL_COMMUNITY): Payer: Self-pay

## 2022-03-23 NOTE — Telephone Encounter (Signed)
Advanced Heart Failure Patient Advocate Encounter  Prior authorization is required for Sildenafil. PA submitted and APPROVED on 03/23/22.  Sherrie George [id number] W Effective: 03/23/22 - 03/23/23  Clista Bernhardt, CPhT Rx Patient Advocate Phone: [phone]

## 2022-04-10 ENCOUNTER — Other Ambulatory Visit: Payer: Self-pay | Admitting: Cardiology

## 2022-04-10 DIAGNOSIS — Z7901 Long term (current) use of anticoagulants: Secondary | ICD-10-CM

## 2022-04-10 NOTE — Telephone Encounter (Signed)
Warfarin refill Last INR 03/21/22 Last OV 03/17/22

## 2022-04-11 ENCOUNTER — Ambulatory Visit: Payer: Medicaid Other | Attending: Cardiology

## 2022-04-11 DIAGNOSIS — Z5181 Encounter for therapeutic drug level monitoring: Secondary | ICD-10-CM | POA: Diagnosis not present

## 2022-04-11 DIAGNOSIS — I4891 Unspecified atrial fibrillation: Secondary | ICD-10-CM

## 2022-04-11 LAB — PROTIME-INR
INR: 7.8 (ref 0.9–1.2)
Prothrombin Time: 70.6 s — ABNORMAL HIGH (ref 9.1–12.0)

## 2022-04-12 NOTE — Patient Instructions (Signed)
Description   POC INR >8.0. STAT PT/INR ordered. Pt sent to lab. Instructed pt to HOLD Warfarin and eat greens until Coumadin Clinic calls with further instructions.  04/11/32 @ 1:45pm, [facility] stated STAT INR was 7.8. Eat greens and HOLD Warfarin today, tomorrow, and Thursday. Then, resume taking 1 tablet daily EXCEPT 1.5 tablets on Sundays.  GO TO NEAREST ER IF SIGNS OR SYMPTOMS OF BLEEDING OCCUR  Recheck INR in 1 week.  Coumadin Clinic [phone]
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
## 2022-04-18 ENCOUNTER — Ambulatory Visit: Payer: Medicaid Other | Attending: Cardiology

## 2022-04-18 DIAGNOSIS — Z5181 Encounter for therapeutic drug level monitoring: Secondary | ICD-10-CM | POA: Diagnosis not present

## 2022-04-18 DIAGNOSIS — I4891 Unspecified atrial fibrillation: Secondary | ICD-10-CM | POA: Diagnosis not present

## 2022-04-18 LAB — POCT INR: INR: 2 (ref 2.0–3.0)

## 2022-04-18 NOTE — Patient Instructions (Signed)
Description   Take 1.5 tablets today and then resume taking 1 tablet daily EXCEPT 1.5 tablets on Sundays.  Recheck INR in 1 week.  Coumadin Clinic [phone]

## 2022-04-19 ENCOUNTER — Other Ambulatory Visit: Payer: Self-pay

## 2022-04-19 ENCOUNTER — Encounter (HOSPITAL_COMMUNITY): Payer: Self-pay

## 2022-04-19 ENCOUNTER — Ambulatory Visit (HOSPITAL_COMMUNITY)
Admission: RE | Admit: 2022-04-19 | Discharge: 2022-04-19 | Disposition: A | Discharge status: Home / Self Care | Payer: Medicaid Other | Source: Ambulatory Visit | Attending: Family Medicine | Admitting: Family Medicine

## 2022-04-19 ENCOUNTER — Other Ambulatory Visit (HOSPITAL_COMMUNITY): Payer: Self-pay

## 2022-04-19 VITALS — BP 112/70 | HR 70 | Wt 191.0 lb

## 2022-04-19 DIAGNOSIS — I5022 Chronic systolic (congestive) heart failure: Secondary | ICD-10-CM

## 2022-04-19 DIAGNOSIS — Z8673 Personal history of transient ischemic attack (TIA), and cerebral infarction without residual deficits: Secondary | ICD-10-CM

## 2022-04-19 DIAGNOSIS — Z7901 Long term (current) use of anticoagulants: Secondary | ICD-10-CM | POA: Diagnosis not present

## 2022-04-19 DIAGNOSIS — I340 Nonrheumatic mitral (valve) insufficiency: Secondary | ICD-10-CM | POA: Diagnosis not present

## 2022-04-19 DIAGNOSIS — Z139 Encounter for screening, unspecified: Secondary | ICD-10-CM

## 2022-04-19 DIAGNOSIS — Z720 Tobacco use: Secondary | ICD-10-CM

## 2022-04-19 DIAGNOSIS — F17210 Nicotine dependence, cigarettes, uncomplicated: Secondary | ICD-10-CM | POA: Diagnosis not present

## 2022-04-19 DIAGNOSIS — Z86718 Personal history of other venous thrombosis and embolism: Secondary | ICD-10-CM | POA: Diagnosis not present

## 2022-04-19 DIAGNOSIS — Z951 Presence of aortocoronary bypass graft: Secondary | ICD-10-CM | POA: Insufficient documentation

## 2022-04-19 DIAGNOSIS — Z79899 Other long term (current) drug therapy: Secondary | ICD-10-CM | POA: Insufficient documentation

## 2022-04-19 DIAGNOSIS — I2721 Secondary pulmonary arterial hypertension: Secondary | ICD-10-CM

## 2022-04-19 DIAGNOSIS — Z91148 Patient's other noncompliance with medication regimen for other reason: Secondary | ICD-10-CM | POA: Diagnosis not present

## 2022-04-19 DIAGNOSIS — Z7982 Long term (current) use of aspirin: Secondary | ICD-10-CM | POA: Diagnosis not present

## 2022-04-19 DIAGNOSIS — I739 Peripheral vascular disease, unspecified: Secondary | ICD-10-CM

## 2022-04-19 DIAGNOSIS — I255 Ischemic cardiomyopathy: Secondary | ICD-10-CM | POA: Diagnosis not present

## 2022-04-19 DIAGNOSIS — Z8679 Personal history of other diseases of the circulatory system: Secondary | ICD-10-CM

## 2022-04-19 DIAGNOSIS — Z7984 Long term (current) use of oral hypoglycemic drugs: Secondary | ICD-10-CM | POA: Insufficient documentation

## 2022-04-19 DIAGNOSIS — I2510 Atherosclerotic heart disease of native coronary artery without angina pectoris: Secondary | ICD-10-CM | POA: Insufficient documentation

## 2022-04-19 DIAGNOSIS — I871 Compression of vein: Secondary | ICD-10-CM

## 2022-04-19 DIAGNOSIS — F1010 Alcohol abuse, uncomplicated: Secondary | ICD-10-CM

## 2022-04-19 DIAGNOSIS — Z952 Presence of prosthetic heart valve: Secondary | ICD-10-CM | POA: Insufficient documentation

## 2022-04-19 DIAGNOSIS — S98919A Complete traumatic amputation of unspecified foot, level unspecified, initial encounter: Secondary | ICD-10-CM

## 2022-04-19 LAB — BASIC METABOLIC PANEL
Anion gap: 9 (ref 5–15)
BUN: 35 mg/dL — ABNORMAL HIGH (ref 6–20)
CO2: 20 mmol/L — ABNORMAL LOW (ref 22–32)
Calcium: 9.1 mg/dL (ref 8.9–10.3)
Chloride: 108 mmol/L (ref 98–111)
Creatinine, Ser: 2.01 mg/dL — ABNORMAL HIGH (ref 0.61–1.24)
GFR, Estimated: 41 mL/min — ABNORMAL LOW (ref >60)
Glucose, Bld: 95 mg/dL (ref 70–99)
Potassium: 4.6 mmol/L (ref 3.5–5.1)
Sodium: 137 mmol/L (ref 135–145)

## 2022-04-19 NOTE — Patient Instructions (Signed)
It was great to see you today! No medication changes are needed at this time.  - a shipment for sildenafil and digoxin will be mailed to your home from the [facility] today We will only contact you if something comes back abnormal or we need to make some changes. Otherwise no news is good news!  Keep cardiology followup as scheduled   Do the following things EVERYDAY: Weigh yourself in the morning before breakfast. Write it down and keep it in a log. Take your medicines as prescribed Eat low salt foods--Limit salt (sodium) to 2000 mg per day.  Stay as active as you can everyday Limit all fluids for the day to less than 2 liters  At the [facility], you and your health needs are our priority. As part of our continuing mission to provide you with exceptional heart care, we have created designated Provider Care Teams. These Care Teams include your primary Cardiologist (physician) and Advanced Practice Providers (APPs- Physician Assistants and Nurse Practitioners) who all work together to provide you with the care you need, when you need it.   You may see any of the following providers on your designated Care Team at your next follow up: Dr Glori Bickers Dr Loralie Champagne Dr. Roxana Hires, NP Lyda Jester, [place] [facility] [place], [place] Forestine Na, NP Audry Riles, PharmD   Please be sure to bring in all your medications bottles to every appointment.    Thank you for choosing [facility]   If you have any questions or concerns before your next appointment please send [place] a message through [place] or call our office at [phone].    TO LEAVE A MESSAGE FOR THE NURSE SELECT OPTION 2, PLEASE LEAVE A MESSAGE INCLUDING: YOUR NAME DATE OF BIRTH CALL BACK NUMBER REASON FOR CALL**this is important as we prioritize the call backs  YOU WILL RECEIVE A CALL BACK THE SAME DAY AS LONG AS YOU  CALL BEFORE 4:00 PM

## 2022-04-21 ENCOUNTER — Telehealth (HOSPITAL_COMMUNITY): Payer: Self-pay

## 2022-04-21 NOTE — Telephone Encounter (Signed)
Voice message left for patient to call office about  lab results. Call back number given.

## 2022-04-25 ENCOUNTER — Telehealth: Payer: Self-pay

## 2022-04-25 ENCOUNTER — Ambulatory Visit: Payer: Medicaid Other | Attending: Cardiology

## 2022-04-25 DIAGNOSIS — Z5181 Encounter for therapeutic drug level monitoring: Secondary | ICD-10-CM

## 2022-04-25 DIAGNOSIS — I4891 Unspecified atrial fibrillation: Secondary | ICD-10-CM

## 2022-04-25 LAB — POCT INR: INR: 3.5 — AB (ref 2.0–3.0)

## 2022-04-25 NOTE — Telephone Encounter (Signed)
Pt missed scheduled coumadin clinic appt. Called, forwarded starlight to voicemail. Left massage to call back to reschedule as soon as possible.

## 2022-04-25 NOTE — Patient Instructions (Signed)
Description   Continue taking 1 tablet daily EXCEPT 1.5 tablets on Sundays.  Recheck INR in 2 weeks.  Coumadin Clinic [phone]

## 2022-05-08 ENCOUNTER — Telehealth (HOSPITAL_COMMUNITY): Payer: Self-pay

## 2022-05-08 NOTE — Telephone Encounter (Signed)
Tried calling patient, no answer,unable to leave message. Will try again later.  Letter mailed.

## 2022-05-08 NOTE — Telephone Encounter (Signed)
-----   Message from Larey Dresser, MD sent at 04/20/2022  4:00 PM EST ----- Yes, can hold off on starting digoxin.  ----- Message ----- From: Shonna Chock, CMA Sent: [id number]   6:15 PM EST To: Larey Dresser, MD  Am I still telling him to stop the digoxin per jessicas orders?

## 2022-05-09 ENCOUNTER — Ambulatory Visit: Payer: Medicaid Other | Attending: Cardiology

## 2022-05-09 DIAGNOSIS — Z5181 Encounter for therapeutic drug level monitoring: Secondary | ICD-10-CM | POA: Insufficient documentation

## 2022-05-09 DIAGNOSIS — Z7901 Long term (current) use of anticoagulants: Secondary | ICD-10-CM | POA: Diagnosis present

## 2022-05-09 DIAGNOSIS — I4891 Unspecified atrial fibrillation: Secondary | ICD-10-CM | POA: Insufficient documentation

## 2022-05-09 LAB — POCT INR: INR: 5.8 — AB (ref 2.0–3.0)

## 2022-05-09 MED ORDER — WARFARIN SODIUM 2 MG PO TABS: ORAL_TABLET | ORAL | 1 refills | Status: DC

## 2022-05-09 NOTE — Patient Instructions (Signed)
Description   HOLD Warfarin today and tomorrow and then continue taking 1 tablet daily EXCEPT 1.5 tablets on Sundays.  Recheck INR in 1 week.  Coumadin Clinic [phone]

## 2022-05-15 ENCOUNTER — Other Ambulatory Visit: Payer: Self-pay

## 2022-05-15 ENCOUNTER — Ambulatory Visit (INDEPENDENT_AMBULATORY_CARE_PROVIDER_SITE_OTHER): Payer: Medicaid Other

## 2022-05-15 ENCOUNTER — Ambulatory Visit
Admission: RE | Admit: 2022-05-15 | Discharge: 2022-05-15 | Disposition: A | Discharge status: Home / Self Care | Payer: Medicaid Other | Source: Ambulatory Visit | Attending: Cardiology | Admitting: Cardiology

## 2022-05-15 ENCOUNTER — Encounter (HOSPITAL_COMMUNITY): Payer: Self-pay | Admitting: Cardiology

## 2022-05-15 VITALS — BP 234/110 | HR 61 | Wt 192.0 lb

## 2022-05-15 DIAGNOSIS — Z7982 Long term (current) use of aspirin: Secondary | ICD-10-CM | POA: Diagnosis not present

## 2022-05-15 DIAGNOSIS — Z952 Presence of prosthetic heart valve: Secondary | ICD-10-CM | POA: Insufficient documentation

## 2022-05-15 DIAGNOSIS — Z7901 Long term (current) use of anticoagulants: Secondary | ICD-10-CM | POA: Insufficient documentation

## 2022-05-15 DIAGNOSIS — I5022 Chronic systolic (congestive) heart failure: Secondary | ICD-10-CM | POA: Insufficient documentation

## 2022-05-15 DIAGNOSIS — Z8673 Personal history of transient ischemic attack (TIA), and cerebral infarction without residual deficits: Secondary | ICD-10-CM | POA: Insufficient documentation

## 2022-05-15 DIAGNOSIS — Z91148 Patient's other noncompliance with medication regimen for other reason: Secondary | ICD-10-CM | POA: Insufficient documentation

## 2022-05-15 DIAGNOSIS — F17210 Nicotine dependence, cigarettes, uncomplicated: Secondary | ICD-10-CM | POA: Insufficient documentation

## 2022-05-15 DIAGNOSIS — I2510 Atherosclerotic heart disease of native coronary artery without angina pectoris: Secondary | ICD-10-CM | POA: Insufficient documentation

## 2022-05-15 DIAGNOSIS — I340 Nonrheumatic mitral (valve) insufficiency: Secondary | ICD-10-CM | POA: Insufficient documentation

## 2022-05-15 DIAGNOSIS — Z89432 Acquired absence of left foot: Secondary | ICD-10-CM | POA: Insufficient documentation

## 2022-05-15 DIAGNOSIS — I255 Ischemic cardiomyopathy: Secondary | ICD-10-CM | POA: Insufficient documentation

## 2022-05-15 DIAGNOSIS — I739 Peripheral vascular disease, unspecified: Secondary | ICD-10-CM | POA: Diagnosis not present

## 2022-05-15 DIAGNOSIS — Z86718 Personal history of other venous thrombosis and embolism: Secondary | ICD-10-CM | POA: Insufficient documentation

## 2022-05-15 DIAGNOSIS — Z951 Presence of aortocoronary bypass graft: Secondary | ICD-10-CM | POA: Diagnosis not present

## 2022-05-15 DIAGNOSIS — Z5181 Encounter for therapeutic drug level monitoring: Secondary | ICD-10-CM | POA: Diagnosis not present

## 2022-05-15 DIAGNOSIS — Z8249 Family history of ischemic heart disease and other diseases of the circulatory system: Secondary | ICD-10-CM | POA: Diagnosis not present

## 2022-05-15 DIAGNOSIS — Z5986 Financial insecurity: Secondary | ICD-10-CM | POA: Insufficient documentation

## 2022-05-15 DIAGNOSIS — Z79899 Other long term (current) drug therapy: Secondary | ICD-10-CM | POA: Diagnosis not present

## 2022-05-15 DIAGNOSIS — Z9714 Presence of artificial left leg (complete) (partial): Secondary | ICD-10-CM | POA: Insufficient documentation

## 2022-05-15 LAB — BASIC METABOLIC PANEL
Anion gap: 7 (ref 5–15)
BUN: 18 mg/dL (ref 6–20)
CO2: 24 mmol/L (ref 22–32)
Calcium: 9.4 mg/dL (ref 8.9–10.3)
Chloride: 107 mmol/L (ref 98–111)
Creatinine, Ser: 1.5 mg/dL — ABNORMAL HIGH (ref 0.61–1.24)
GFR, Estimated: 59 mL/min — ABNORMAL LOW (ref >60)
Glucose, Bld: 107 mg/dL — ABNORMAL HIGH (ref 70–99)
Potassium: 4.8 mmol/L (ref 3.5–5.1)
Sodium: 138 mmol/L (ref 135–145)

## 2022-05-15 LAB — PROTIME-INR
INR: 3.6 — ABNORMAL HIGH (ref 0.8–1.2)
Prothrombin Time: 35.3 seconds — ABNORMAL HIGH (ref 11.4–15.2)

## 2022-05-15 LAB — CBC
HCT: 49.1 % (ref 39.0–52.0)
Hemoglobin: 16.5 g/dL (ref 13.0–17.0)
MCH: 29.9 pg (ref 26.0–34.0)
MCHC: 33.6 g/dL (ref 30.0–36.0)
MCV: 89.1 fL (ref 80.0–100.0)
Platelets: 135 10*3/uL — ABNORMAL LOW (ref 150–400)
RBC: 5.51 MIL/uL (ref 4.22–5.81)
RDW: 16.5 % — ABNORMAL HIGH (ref 11.5–15.5)
WBC: 8.2 10*3/uL (ref 4.0–10.5)
nRBC: 0 % (ref 0.0–0.2)

## 2022-05-15 LAB — DIGOXIN LEVEL: Digoxin Level: 1.1 ng/mL (ref 0.8–2.0)

## 2022-05-15 MED ORDER — ENTRESTO 49-51 MG PO TABS
1.0000 | ORAL_TABLET | Freq: Two times a day (BID) | ORAL | 3 refills | Status: DC
  Filled 2022-05-15: qty 60, 30d supply, fill #0
  Filled 2022-06-12: qty 60, 30d supply, fill #1
  Filled 2022-07-25 (×2): qty 60, 30d supply, fill #2
  Filled 2022-09-12: qty 60, 30d supply, fill #3
  Filled 2022-10-25: qty 60, 30d supply, fill #4
  Filled 2022-11-24: qty 60, 30d supply, fill #5
  Filled 2023-01-02: qty 60, 30d supply, fill #6
  Filled 2023-02-01 (×2): qty 60, 30d supply, fill #7
  Filled 2023-03-16: qty 60, 30d supply, fill #8

## 2022-05-15 MED ORDER — EMPAGLIFLOZIN 10 MG PO TABS
10.0000 mg | ORAL_TABLET | Freq: Every day | ORAL | 3 refills | Status: DC
  Filled 2022-05-15: qty 90, 90d supply, fill #0
  Filled 2022-08-22: qty 90, 90d supply, fill #1
  Filled 2022-11-24: qty 90, 90d supply, fill #2
  Filled 2023-02-19: qty 90, 90d supply, fill #3

## 2022-05-15 NOTE — Progress Notes (Signed)
Medication Samples have been provided to the patient.  Drug name: Jardiance       Strength: 10        Qty: 7 LOT: [id number]  Exp.Date: 03/26  Dosing instructions: Take 1 tablet daily.  The patient has been instructed regarding the correct time, dose, and frequency of taking this medication, including desired effects and most common side effects.    Medication Samples have been provided to the patient.  Drug name: Jardiance       Strength: 10        Qty: 7  LOT: [id number]  Exp.Date: 11/25  Dosing instructions: Patient takes 1 tablet daily.  The patient has been instructed regarding the correct time, dose, and frequency of taking this medication, including desired effects and most common side effects.   Medication Samples have been provided to the patient.   Drug name: Delene Loll       Strength: 49-51        Qty: 28  LOT: [id number]  Exp.Date: 8/25  Dosing instructions: Patient takes 1 tablet twice a day.  The patient has been instructed regarding the correct time, dose, and frequency of taking this medication, including desired effects and most common side effects.    Tiney Rouge Adside 1:18 PM 05/15/2022

## 2022-05-15 NOTE — Patient Instructions (Signed)
STOP Digoxin  RESTART Jardiance and Entresto.  Labs done today, your results will be available in MyChart, we will contact you for abnormal readings.  Repeat blood work in 10 days.  Your physician has requested that you have an echocardiogram. Echocardiography is a painless test that uses sound waves to create images of your heart. It provides your doctor with information about the size and shape of your heart and how well your heart's chambers and valves are working. This procedure takes approximately one hour. There are no restrictions for this procedure. Please do NOT wear cologne, perfume, aftershave, or lotions (deodorant is allowed). Please arrive 15 minutes prior to your appointment time.  Your physician recommends that you schedule a follow-up appointment in: 3 months with an echocardiogram (May ) ** please call the office in April to arrange your follow up appointment. **  If you have any questions or concerns before your next appointment please send [place] a message through [place] or call our office at [phone].    TO LEAVE A MESSAGE FOR THE NURSE SELECT OPTION 2, PLEASE LEAVE A MESSAGE INCLUDING: YOUR NAME DATE OF BIRTH CALL BACK NUMBER REASON FOR CALL**this is important as we prioritize the call backs  YOU WILL RECEIVE A CALL BACK THE SAME DAY AS LONG AS YOU CALL BEFORE 4:00 PM  At the [facility], you and your health needs are our priority. As part of our continuing mission to provide you with exceptional heart care, we have created designated Provider Care Teams. These Care Teams include your primary Cardiologist (physician) and Advanced Practice Providers (APPs- Physician Assistants and Nurse Practitioners) who all work together to provide you with the care you need, when you need it.   You may see any of the following providers on your designated Care Team at your next follow up: Dr Glori Bickers Dr Loralie Champagne Dr. Roxana Hires,  NP Lyda Jester, [place] [facility] [place], [place] Forestine Na, NP Audry Riles, PharmD   Please be sure to bring in all your medications bottles to every appointment.    Thank you for choosing [facility]
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
## 2022-05-16 ENCOUNTER — Ambulatory Visit: Payer: Medicaid Other

## 2022-05-17 NOTE — Patient Instructions (Signed)
Description   Unable to get in contact with pt. Left multiple messages on pt's voicemail on 2/17 and 2/28 . Pt was advised to only take 0.5 tablet on 2/27 and then continue taking 1 tablet daily EXCEPT 1.5 tablets on Sundays.  Recheck INR in 1 week.  Coumadin Clinic [phone]

## 2022-05-23 ENCOUNTER — Ambulatory Visit: Payer: Medicaid Other | Attending: Cardiology

## 2022-05-23 DIAGNOSIS — I4891 Unspecified atrial fibrillation: Secondary | ICD-10-CM | POA: Diagnosis present

## 2022-05-23 DIAGNOSIS — Z5181 Encounter for therapeutic drug level monitoring: Secondary | ICD-10-CM

## 2022-05-23 LAB — POCT INR: INR: 7.7 — AB (ref 2.0–3.0)

## 2022-05-23 NOTE — Patient Instructions (Signed)
Description   HOLD Warfarin today, tomorrow, and Thursday and then START continue taking 1 tablet daily Seek immediate medical attention if you experience any signs or symptoms of bleeding.  Recheck INR in 1 week.  Coumadin Clinic [phone]

## 2022-05-30 ENCOUNTER — Other Ambulatory Visit (HOSPITAL_COMMUNITY): Payer: Self-pay

## 2022-05-30 ENCOUNTER — Other Ambulatory Visit (HOSPITAL_COMMUNITY): Payer: Self-pay | Admitting: Adult Health

## 2022-05-30 ENCOUNTER — Ambulatory Visit: Payer: Medicaid Other | Attending: Cardiology

## 2022-05-30 DIAGNOSIS — I639 Cerebral infarction, unspecified: Secondary | ICD-10-CM | POA: Diagnosis present

## 2022-05-30 DIAGNOSIS — Z5181 Encounter for therapeutic drug level monitoring: Secondary | ICD-10-CM | POA: Insufficient documentation

## 2022-05-30 DIAGNOSIS — I4891 Unspecified atrial fibrillation: Secondary | ICD-10-CM | POA: Diagnosis not present

## 2022-05-30 DIAGNOSIS — Z7901 Long term (current) use of anticoagulants: Secondary | ICD-10-CM

## 2022-05-30 DIAGNOSIS — Z952 Presence of prosthetic heart valve: Secondary | ICD-10-CM | POA: Diagnosis not present

## 2022-05-30 LAB — POCT INR: INR: 2.8 (ref 2.0–3.0)

## 2022-05-30 MED ORDER — WARFARIN SODIUM 2 MG PO TABS
2.0000 mg | ORAL_TABLET | Freq: Every day | ORAL | 2 refills | Status: DC
  Filled 2022-05-30 – 2022-06-06 (×2): qty 30, 30d supply, fill #0

## 2022-05-30 NOTE — Patient Instructions (Signed)
Description   Continue taking 1 tablet daily Recheck INR in 1 week.  Coumadin Clinic [phone]

## 2022-05-30 NOTE — Telephone Encounter (Signed)
Prescription refill request received for warfarin Lov: 05/15/22 Aundra Dubin)  Next INR check: 06/06/22 Warfarin tablet strength: '2mg'$   Appropriate dose. Refill sent.

## 2022-05-31 ENCOUNTER — Other Ambulatory Visit (HOSPITAL_COMMUNITY): Payer: Self-pay

## 2022-06-06 ENCOUNTER — Ambulatory Visit: Payer: Medicaid Other | Attending: Cardiology

## 2022-06-06 ENCOUNTER — Other Ambulatory Visit (HOSPITAL_COMMUNITY): Payer: Self-pay

## 2022-06-06 DIAGNOSIS — I4891 Unspecified atrial fibrillation: Secondary | ICD-10-CM | POA: Diagnosis present

## 2022-06-06 DIAGNOSIS — Z5181 Encounter for therapeutic drug level monitoring: Secondary | ICD-10-CM

## 2022-06-06 LAB — POCT INR: INR: 3.6 — AB (ref 2.0–3.0)

## 2022-06-06 NOTE — Patient Instructions (Addendum)
Description   Only take 1/2 tablet tomorrow and then continue taking 1 tablet daily Recheck INR in 2 weeks  Coumadin Clinic [phone]

## 2022-06-07 ENCOUNTER — Other Ambulatory Visit (HOSPITAL_COMMUNITY): Payer: Self-pay

## 2022-06-08 ENCOUNTER — Other Ambulatory Visit (HOSPITAL_COMMUNITY): Payer: Self-pay

## 2022-06-12 ENCOUNTER — Other Ambulatory Visit (HOSPITAL_COMMUNITY): Payer: Self-pay

## 2022-06-16 ENCOUNTER — Other Ambulatory Visit (HOSPITAL_COMMUNITY): Payer: Self-pay

## 2022-06-20 ENCOUNTER — Ambulatory Visit: Payer: Medicaid Other | Attending: Cardiology

## 2022-06-20 DIAGNOSIS — Z5181 Encounter for therapeutic drug level monitoring: Secondary | ICD-10-CM | POA: Diagnosis not present

## 2022-06-20 DIAGNOSIS — I4891 Unspecified atrial fibrillation: Secondary | ICD-10-CM | POA: Diagnosis not present

## 2022-06-20 DIAGNOSIS — I639 Cerebral infarction, unspecified: Secondary | ICD-10-CM | POA: Diagnosis not present

## 2022-06-20 DIAGNOSIS — Z7901 Long term (current) use of anticoagulants: Secondary | ICD-10-CM

## 2022-06-20 DIAGNOSIS — Z952 Presence of prosthetic heart valve: Secondary | ICD-10-CM | POA: Diagnosis present

## 2022-06-20 LAB — POCT INR: INR: 3.2 — AB (ref 2.0–3.0)

## 2022-06-20 NOTE — Patient Instructions (Signed)
Description   Continue taking 1 tablet daily.  Recheck INR in 2 weeks. Coumadin Clinic #[phone]      

## 2022-06-30 ENCOUNTER — Other Ambulatory Visit (HOSPITAL_COMMUNITY): Payer: Self-pay

## 2022-06-30 ENCOUNTER — Telehealth (HOSPITAL_COMMUNITY): Payer: Self-pay | Admitting: Pharmacy Technician

## 2022-06-30 NOTE — Telephone Encounter (Signed)
Advanced Heart Failure Patient Advocate Encounter  Received notification from [employer] that it is time to renew the patient's [place] assistance. Patient is currently insured under traditional Medicaid. No PA required. Looks like patient is already filling at one of our [employer]. Will not seek renewal at this time.  Archer Asa, CPhT

## 2022-07-04 ENCOUNTER — Ambulatory Visit: Payer: Medicaid Other | Attending: Cardiology

## 2022-07-04 DIAGNOSIS — Z5181 Encounter for therapeutic drug level monitoring: Secondary | ICD-10-CM

## 2022-07-04 DIAGNOSIS — I4891 Unspecified atrial fibrillation: Secondary | ICD-10-CM | POA: Diagnosis present

## 2022-07-04 LAB — POCT INR: INR: 2.9 (ref 2.0–3.0)

## 2022-07-04 NOTE — Patient Instructions (Signed)
Description   Continue taking 1 tablet daily.  Recheck INR in 2 weeks. Coumadin Clinic #[phone]      

## 2022-07-18 ENCOUNTER — Ambulatory Visit: Payer: Medicaid Other | Attending: Cardiology

## 2022-07-18 DIAGNOSIS — Z7901 Long term (current) use of anticoagulants: Secondary | ICD-10-CM | POA: Diagnosis present

## 2022-07-18 DIAGNOSIS — Z5181 Encounter for therapeutic drug level monitoring: Secondary | ICD-10-CM

## 2022-07-18 DIAGNOSIS — I4891 Unspecified atrial fibrillation: Secondary | ICD-10-CM | POA: Insufficient documentation

## 2022-07-18 LAB — POCT INR: INR: 2.2 (ref 2.0–3.0)

## 2022-07-18 MED ORDER — WARFARIN SODIUM 2 MG PO TABS
2.0000 mg | ORAL_TABLET | Freq: Every day | ORAL | 0 refills | Status: DC
Start: 2022-07-18 — End: 2022-08-15

## 2022-07-18 NOTE — Patient Instructions (Signed)
Description   Take 2 tablets today and then continue taking 1 tablet daily Recheck INR in 2 weeks  Coumadin Clinic [phone]

## 2022-07-25 ENCOUNTER — Other Ambulatory Visit: Payer: Self-pay

## 2022-07-25 ENCOUNTER — Other Ambulatory Visit (HOSPITAL_COMMUNITY): Payer: Self-pay | Admitting: Cardiology

## 2022-07-25 ENCOUNTER — Other Ambulatory Visit (HOSPITAL_COMMUNITY): Payer: Self-pay

## 2022-07-25 DIAGNOSIS — Z7901 Long term (current) use of anticoagulants: Secondary | ICD-10-CM

## 2022-07-25 MED ORDER — WARFARIN SODIUM 2 MG PO TABS
2.0000 mg | ORAL_TABLET | Freq: Every day | ORAL | 2 refills | Status: DC
Start: 2022-07-25 — End: 2022-08-15
  Filled 2022-07-25: qty 30, 30d supply, fill #0

## 2022-07-25 NOTE — Telephone Encounter (Signed)
Prescription refill request received for warfarin Lov: 05/15/22 Mark Garrett)  Next INR check: 08/01/22 Warfarin tablet strength: 2mg   Appropriate dose. Refill sent.Marland Kitchen

## 2022-08-01 ENCOUNTER — Ambulatory Visit: Payer: Medicaid Other | Attending: Cardiology

## 2022-08-01 DIAGNOSIS — Z5181 Encounter for therapeutic drug level monitoring: Secondary | ICD-10-CM | POA: Insufficient documentation

## 2022-08-01 DIAGNOSIS — I4891 Unspecified atrial fibrillation: Secondary | ICD-10-CM

## 2022-08-01 LAB — POCT INR: INR: 3.1 — AB (ref 2.0–3.0)

## 2022-08-01 NOTE — Patient Instructions (Signed)
Description   Continue taking 1 tablet daily.  Recheck INR in 2 weeks. Coumadin Clinic #[phone]      

## 2022-08-15 ENCOUNTER — Other Ambulatory Visit: Payer: Self-pay | Admitting: Cardiology

## 2022-08-15 ENCOUNTER — Ambulatory Visit: Payer: Medicaid Other | Attending: Cardiology

## 2022-08-15 DIAGNOSIS — Z952 Presence of prosthetic heart valve: Secondary | ICD-10-CM | POA: Insufficient documentation

## 2022-08-15 DIAGNOSIS — Z7901 Long term (current) use of anticoagulants: Secondary | ICD-10-CM | POA: Diagnosis present

## 2022-08-15 DIAGNOSIS — I639 Cerebral infarction, unspecified: Secondary | ICD-10-CM | POA: Insufficient documentation

## 2022-08-15 DIAGNOSIS — Z5181 Encounter for therapeutic drug level monitoring: Secondary | ICD-10-CM | POA: Insufficient documentation

## 2022-08-15 DIAGNOSIS — I4891 Unspecified atrial fibrillation: Secondary | ICD-10-CM | POA: Insufficient documentation

## 2022-08-15 LAB — POCT INR: INR: 5.9 — AB (ref 2.0–3.0)

## 2022-08-15 NOTE — Patient Instructions (Signed)
Description   Hold today's dose and tomorrow's dose and then continue taking 1 tablet daily Recheck INR in 1 week  [facility]madin Clinic [phone]

## 2022-08-22 ENCOUNTER — Other Ambulatory Visit: Payer: Self-pay

## 2022-08-22 ENCOUNTER — Other Ambulatory Visit (HOSPITAL_COMMUNITY): Payer: Self-pay

## 2022-08-22 ENCOUNTER — Ambulatory Visit: Payer: Medicaid Other | Attending: Cardiology

## 2022-08-22 DIAGNOSIS — I4891 Unspecified atrial fibrillation: Secondary | ICD-10-CM | POA: Diagnosis present

## 2022-08-22 DIAGNOSIS — Z952 Presence of prosthetic heart valve: Secondary | ICD-10-CM | POA: Diagnosis present

## 2022-08-22 DIAGNOSIS — I639 Cerebral infarction, unspecified: Secondary | ICD-10-CM | POA: Insufficient documentation

## 2022-08-22 DIAGNOSIS — Z5181 Encounter for therapeutic drug level monitoring: Secondary | ICD-10-CM | POA: Insufficient documentation

## 2022-08-22 DIAGNOSIS — Z7901 Long term (current) use of anticoagulants: Secondary | ICD-10-CM

## 2022-08-22 LAB — POCT INR: INR: 5.9 — AB (ref 2.0–3.0)

## 2022-08-22 NOTE — Patient Instructions (Signed)
Hold Wednesday, Thursday and Friday and then continue taking 1 tablet daily Recheck INR in 1 week  Coumadin Clinic [phone]

## 2022-08-29 ENCOUNTER — Ambulatory Visit: Payer: Medicaid Other | Attending: Cardiology

## 2022-08-29 DIAGNOSIS — Z5181 Encounter for therapeutic drug level monitoring: Secondary | ICD-10-CM

## 2022-08-29 DIAGNOSIS — I4891 Unspecified atrial fibrillation: Secondary | ICD-10-CM | POA: Diagnosis present

## 2022-08-29 LAB — POCT INR: INR: 2.1 (ref 2.0–3.0)

## 2022-08-29 NOTE — Patient Instructions (Signed)
Description   Take 1.5 tablets today and then continue taking 1 tablet daily Recheck INR in 1 week  C[facility]madin Clinic [phone]

## 2022-09-05 ENCOUNTER — Ambulatory Visit: Payer: Medicaid Other | Attending: Cardiology

## 2022-09-05 DIAGNOSIS — Z5181 Encounter for therapeutic drug level monitoring: Secondary | ICD-10-CM

## 2022-09-05 DIAGNOSIS — I4891 Unspecified atrial fibrillation: Secondary | ICD-10-CM

## 2022-09-05 LAB — POCT INR: INR: 2 (ref 2.0–3.0)

## 2022-09-05 NOTE — Patient Instructions (Signed)
Description   Take 1.5 tablets today and then START taking 1 tablet daily EXCEPT 1.5 tablets on Sundays.  Recheck INR in 1 week  C[facility]madin Clinic [phone]

## 2022-09-12 ENCOUNTER — Ambulatory Visit: Payer: Medicaid Other | Attending: Cardiology

## 2022-09-12 ENCOUNTER — Other Ambulatory Visit (HOSPITAL_COMMUNITY): Payer: Self-pay

## 2022-09-12 ENCOUNTER — Other Ambulatory Visit: Payer: Self-pay

## 2022-09-12 DIAGNOSIS — I4891 Unspecified atrial fibrillation: Secondary | ICD-10-CM | POA: Insufficient documentation

## 2022-09-12 DIAGNOSIS — Z5181 Encounter for therapeutic drug level monitoring: Secondary | ICD-10-CM | POA: Diagnosis present

## 2022-09-12 LAB — POCT INR: INR: 2.5 (ref 2.0–3.0)

## 2022-09-12 NOTE — Patient Instructions (Signed)
Description   Continue taking 1 tablet daily EXCEPT 1.5 tablets on Sundays.  Recheck INR in 2 weeks.  [facility]in Clinic #[phone]      

## 2022-09-13 ENCOUNTER — Other Ambulatory Visit: Payer: Self-pay

## 2022-09-26 ENCOUNTER — Ambulatory Visit: Payer: MEDICAID | Attending: Cardiology

## 2022-09-26 DIAGNOSIS — Z7901 Long term (current) use of anticoagulants: Secondary | ICD-10-CM

## 2022-09-26 DIAGNOSIS — I4891 Unspecified atrial fibrillation: Secondary | ICD-10-CM

## 2022-09-26 DIAGNOSIS — Z5181 Encounter for therapeutic drug level monitoring: Secondary | ICD-10-CM

## 2022-09-26 LAB — POCT INR: INR: 2.7 (ref 2.0–3.0)

## 2022-09-26 MED ORDER — WARFARIN SODIUM 2 MG PO TABS
ORAL_TABLET | ORAL | 0 refills | Status: DC
Start: 2022-09-26 — End: 2022-10-20

## 2022-09-26 NOTE — Patient Instructions (Signed)
Description   Take 1.5 tablets today and then continue taking 1 tablet daily EXCEPT 1.5 tablets on Sundays.  Recheck INR in 1 week [facility]in Clinic [phone]

## 2022-10-03 ENCOUNTER — Ambulatory Visit: Payer: MEDICAID | Attending: Cardiology

## 2022-10-03 DIAGNOSIS — Z5181 Encounter for therapeutic drug level monitoring: Secondary | ICD-10-CM

## 2022-10-03 DIAGNOSIS — I4891 Unspecified atrial fibrillation: Secondary | ICD-10-CM | POA: Diagnosis not present

## 2022-10-03 LAB — POCT INR: INR: 3.3 — AB (ref 2.0–3.0)

## 2022-10-03 NOTE — Patient Instructions (Signed)
Description   Continue taking 1 tablet daily EXCEPT 1.5 tablets on Sundays.  Recheck INR in 2 weeks.  [facility]in Clinic #[phone]      

## 2022-10-17 ENCOUNTER — Ambulatory Visit: Payer: MEDICAID | Attending: Cardiology

## 2022-10-17 DIAGNOSIS — Z5181 Encounter for therapeutic drug level monitoring: Secondary | ICD-10-CM | POA: Diagnosis not present

## 2022-10-17 DIAGNOSIS — I4891 Unspecified atrial fibrillation: Secondary | ICD-10-CM | POA: Diagnosis not present

## 2022-10-17 LAB — POCT INR: INR: 3.8 — AB (ref 2.0–3.0)

## 2022-10-17 NOTE — Patient Instructions (Signed)
Description   Only take 1/2 tablet tomorrow and then continue taking 1 tablet daily EXCEPT 1.5 tablets on Sundays.  Recheck INR in 2 weeks [facility]madin Clinic [phone]

## 2022-10-20 ENCOUNTER — Other Ambulatory Visit: Payer: Self-pay | Admitting: Cardiology

## 2022-10-20 DIAGNOSIS — Z7901 Long term (current) use of anticoagulants: Secondary | ICD-10-CM

## 2022-10-25 ENCOUNTER — Other Ambulatory Visit (HOSPITAL_COMMUNITY): Payer: Self-pay | Admitting: Cardiology

## 2022-10-25 ENCOUNTER — Other Ambulatory Visit (HOSPITAL_COMMUNITY): Payer: Self-pay

## 2022-10-25 DIAGNOSIS — Z7901 Long term (current) use of anticoagulants: Secondary | ICD-10-CM

## 2022-10-26 ENCOUNTER — Other Ambulatory Visit: Payer: Self-pay

## 2022-10-26 MED ORDER — WARFARIN SODIUM 2 MG PO TABS
ORAL_TABLET | ORAL | 2 refills | Status: DC
Start: 2022-10-26 — End: 2023-02-19
  Filled 2022-10-26: qty 40, 30d supply, fill #0
  Filled 2022-11-24: qty 40, 30d supply, fill #1
  Filled 2023-01-08: qty 40, 30d supply, fill #2

## 2022-10-26 NOTE — Telephone Encounter (Signed)
Prescription refill request received for warfarin Lov: 05/15/22 Shirlee Latch)  Next INR check: 10/31/22 Warfarin tablet strength: 2mg   Appropriate dose. Refill sent.

## 2022-10-30 ENCOUNTER — Other Ambulatory Visit (HOSPITAL_COMMUNITY): Payer: Self-pay

## 2022-10-31 ENCOUNTER — Ambulatory Visit: Payer: MEDICAID | Attending: Cardiology

## 2022-10-31 DIAGNOSIS — Z5181 Encounter for therapeutic drug level monitoring: Secondary | ICD-10-CM | POA: Diagnosis not present

## 2022-10-31 DIAGNOSIS — I4891 Unspecified atrial fibrillation: Secondary | ICD-10-CM | POA: Diagnosis not present

## 2022-10-31 LAB — POCT INR: INR: 3.4 — AB (ref 2.0–3.0)

## 2022-10-31 NOTE — Patient Instructions (Signed)
Description   Continue taking 1 tablet daily EXCEPT 1.5 tablets on Sundays.  Recheck INR in 2 weeks.  [facility]in Clinic #[phone]      

## 2022-11-14 ENCOUNTER — Ambulatory Visit: Payer: MEDICAID | Attending: Cardiology

## 2022-11-14 DIAGNOSIS — Z7901 Long term (current) use of anticoagulants: Secondary | ICD-10-CM

## 2022-11-14 DIAGNOSIS — I82403 Acute embolism and thrombosis of unspecified deep veins of lower extremity, bilateral: Secondary | ICD-10-CM

## 2022-11-14 DIAGNOSIS — I639 Cerebral infarction, unspecified: Secondary | ICD-10-CM

## 2022-11-14 DIAGNOSIS — Z5181 Encounter for therapeutic drug level monitoring: Secondary | ICD-10-CM | POA: Diagnosis not present

## 2022-11-14 DIAGNOSIS — Z952 Presence of prosthetic heart valve: Secondary | ICD-10-CM

## 2022-11-14 DIAGNOSIS — I4891 Unspecified atrial fibrillation: Secondary | ICD-10-CM | POA: Diagnosis not present

## 2022-11-14 LAB — POCT INR: INR: 3.9 — AB (ref 2.0–3.0)

## 2022-11-14 NOTE — Patient Instructions (Signed)
Description   Hold tomorrow's dose and then continue taking 1 tablet daily EXCEPT 1.5 tablets on Sundays.  Recheck INR in 2 weeks [facility]madin Clinic [phone]

## 2022-11-24 ENCOUNTER — Other Ambulatory Visit (HOSPITAL_COMMUNITY): Payer: Self-pay

## 2022-11-28 ENCOUNTER — Ambulatory Visit: Payer: MEDICAID | Attending: Cardiology

## 2022-11-28 DIAGNOSIS — Z7901 Long term (current) use of anticoagulants: Secondary | ICD-10-CM

## 2022-11-28 DIAGNOSIS — Z5181 Encounter for therapeutic drug level monitoring: Secondary | ICD-10-CM | POA: Diagnosis not present

## 2022-11-28 DIAGNOSIS — I4891 Unspecified atrial fibrillation: Secondary | ICD-10-CM | POA: Diagnosis not present

## 2022-11-28 DIAGNOSIS — Z952 Presence of prosthetic heart valve: Secondary | ICD-10-CM

## 2022-11-28 DIAGNOSIS — I82403 Acute embolism and thrombosis of unspecified deep veins of lower extremity, bilateral: Secondary | ICD-10-CM

## 2022-11-28 DIAGNOSIS — I639 Cerebral infarction, unspecified: Secondary | ICD-10-CM

## 2022-11-28 LAB — POCT INR: INR: 3.7 — AB (ref 2.0–3.0)

## 2022-11-28 NOTE — Patient Instructions (Signed)
Description   Only take 1/2 tablet tomorrow and then continue taking 1 tablet daily EXCEPT 1.5 tablets on Sundays.  Recheck INR in 2 weeks [facility]madin Clinic [phone]

## 2022-12-08 IMAGING — DX DG CHEST 2V
2 series · 2 of 2 positions shown · non-contrast
Comparison: CT chest 12/02/2020.

CLINICAL DATA: sob, chest pain

EXAM:
CHEST - 2 VIEW

[chest lat]
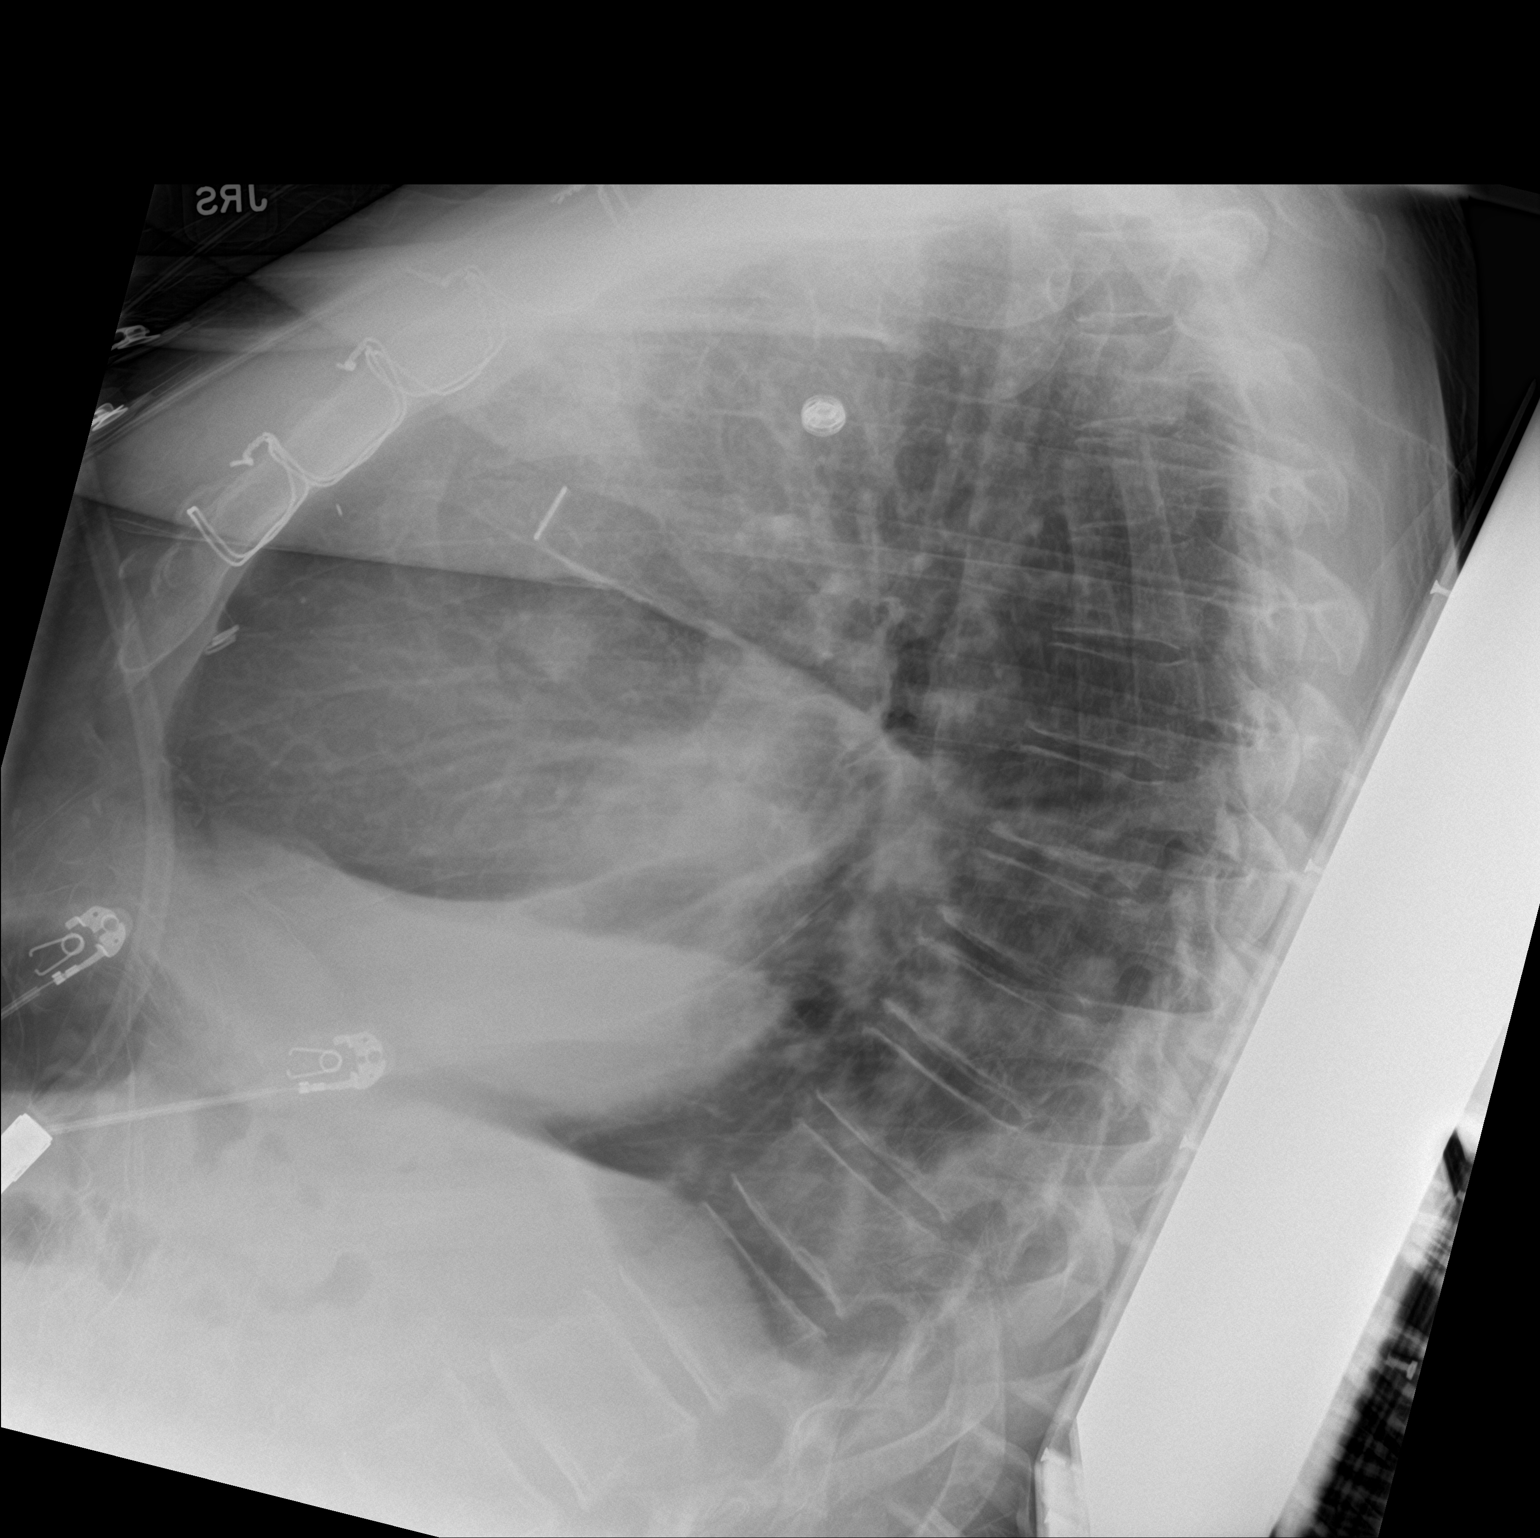

[chest ap]
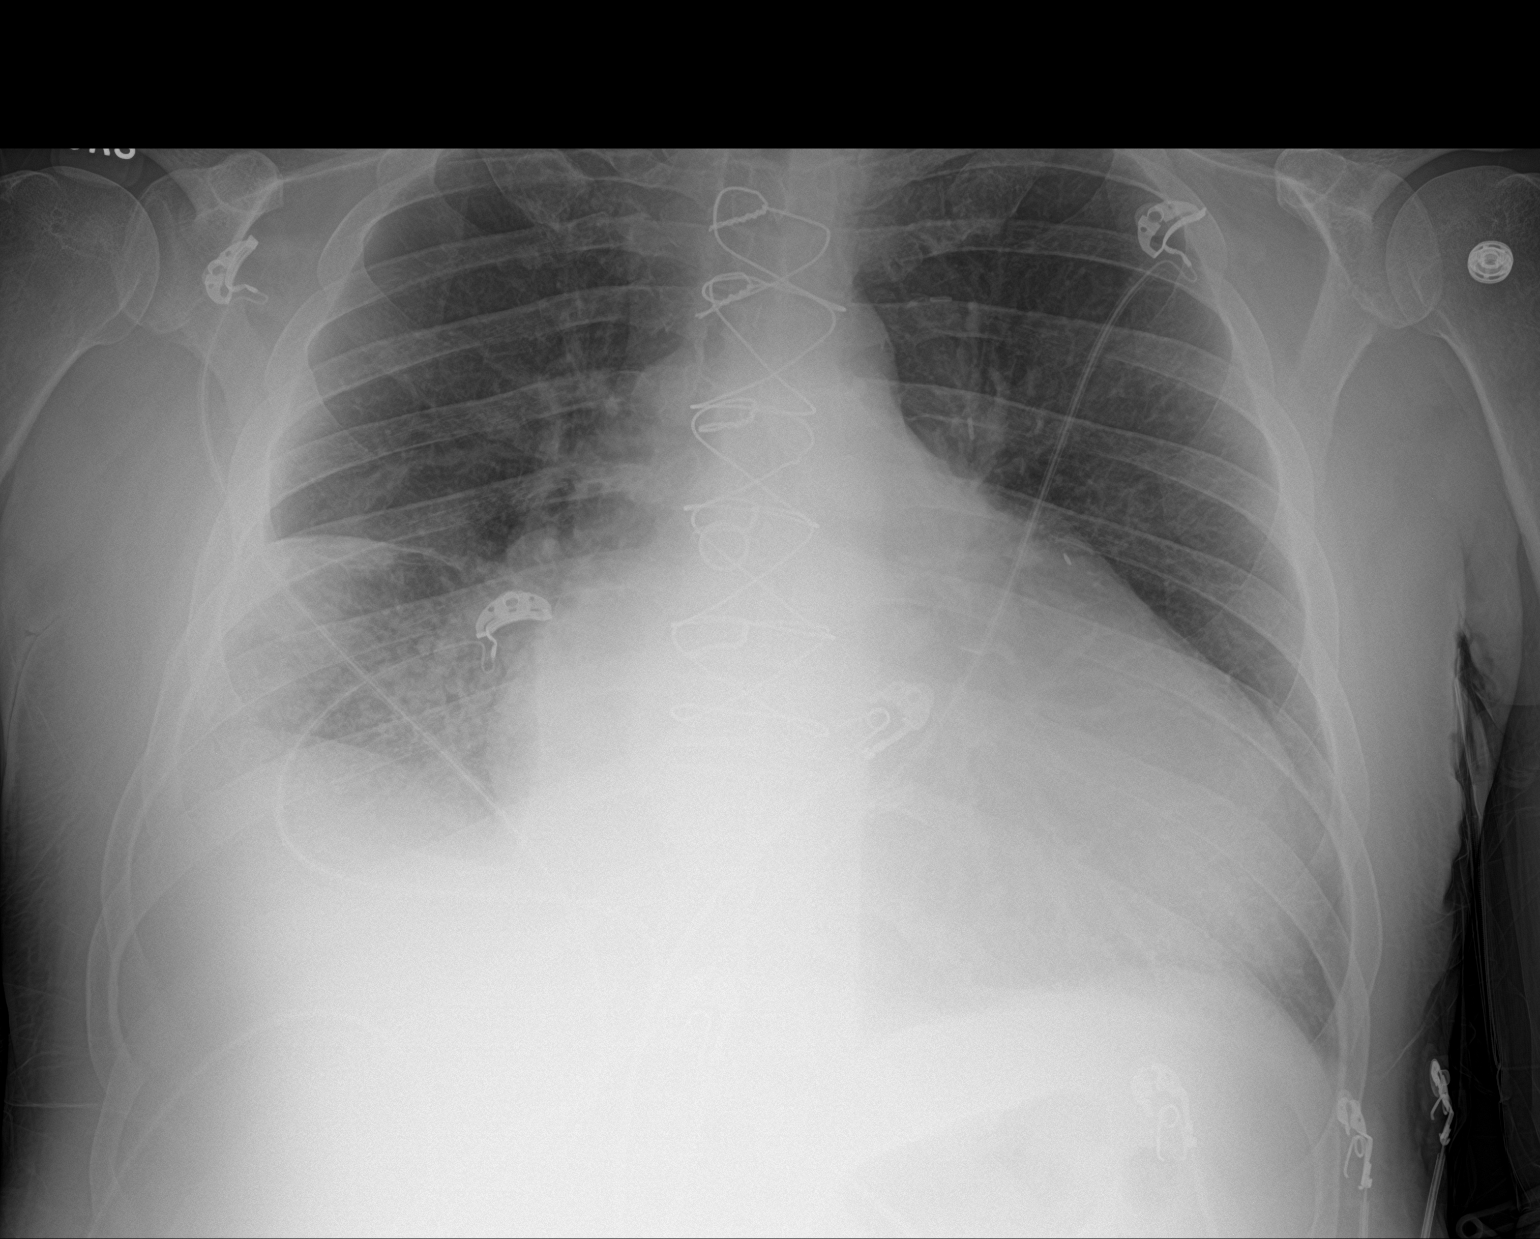

[2 of 2 positions shown; findings below may reference images not displayed]

FINDINGS: Persistent patchy airspace opacities in the right lower lung,
overall increased. Probable small right pleural effusion with fluid
tracking along the fissure. No visible pneumothorax. Pulmonary
nodules were better characterized on prior CT chest. Enlarged
cardiac silhouette. Median sternotomy. No evidence of acute osseous
abnormality.
IMPRESSION: 1. Persistent patchy airspace opacities in the right lower lung,
overall increased. Primary differential considerations include
infection or alveolar hemorrhage given findings on prior CT.
Probable small right pleural effusion with fluid tracking along the
fissure.
2. Cardiomegaly.
3. Pulmonary nodules were better characterized on prior CT chest.

## 2022-12-08 IMAGING — CT CT ANGIO CHEST
2 of 7 series · 18 of 46 positions shown · IV contrast (omnipaque)
Comparison: CT examination dated [DATE], the MOOLMAN hemangioma

CLINICAL DATA: Shortness of breath

EXAM:
CT ANGIOGRAPHY CHEST WITH CONTRAST
TECHNIQUE: Multidetector CT imaging of the chest was performed using the
standard protocol during bolus administration of intravenous
contrast. Multiplanar CT image reconstructions and MIPs were
obtained to evaluate the vascular anatomy.
CONTRAST:  75mL OMNIPAQUE IOHEXOL 350 MG/ML SOLN

[Series 7: thins · axial · 0.73mm/px · z∈[+1108,+1385]mm · 15 of 445 slices shown]
[im 25/445  lung]
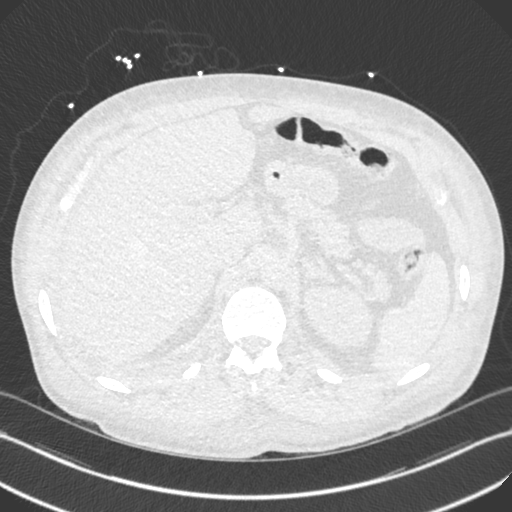
[im 50/445  soft-tissue]
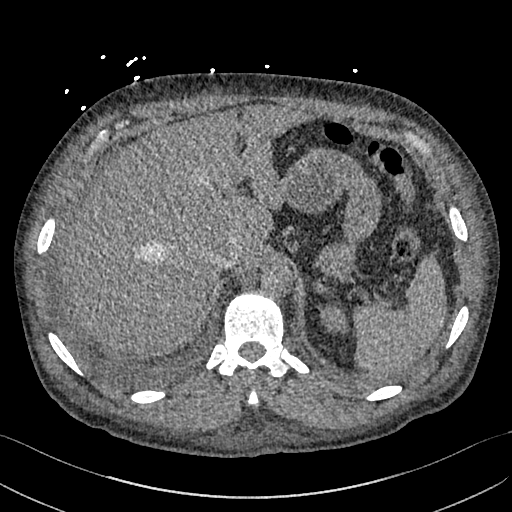
[im 75/445  lung]
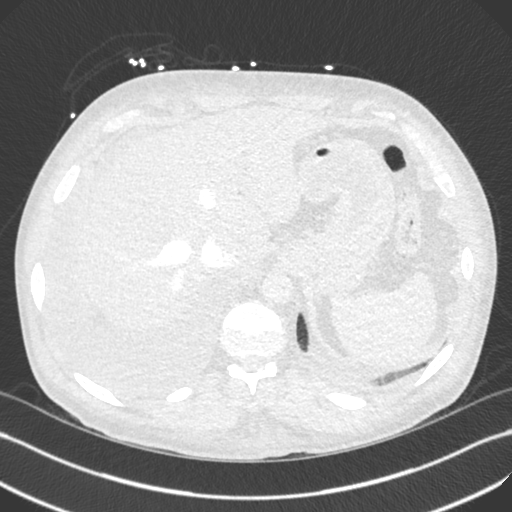
[im 99/445  soft-tissue]
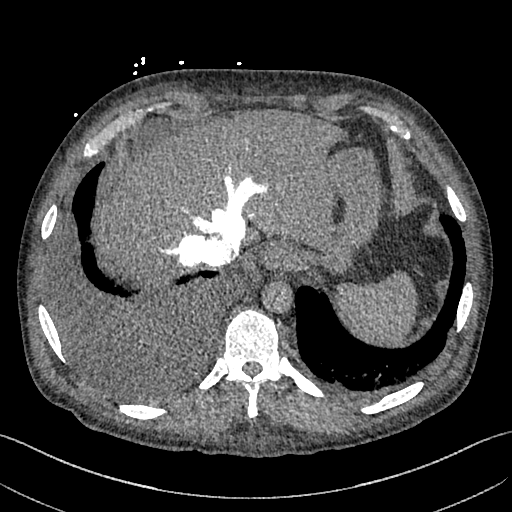
[im 149/445  lung]
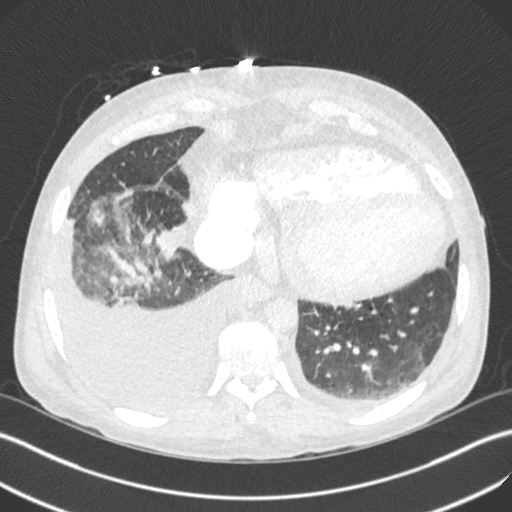
[im 173/445  soft-tissue]
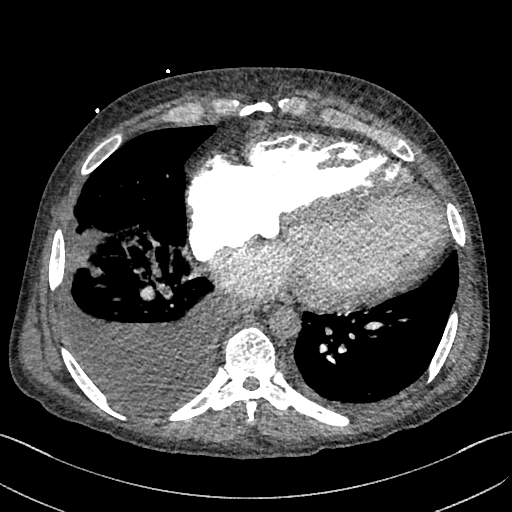
[im 198/445  lung]
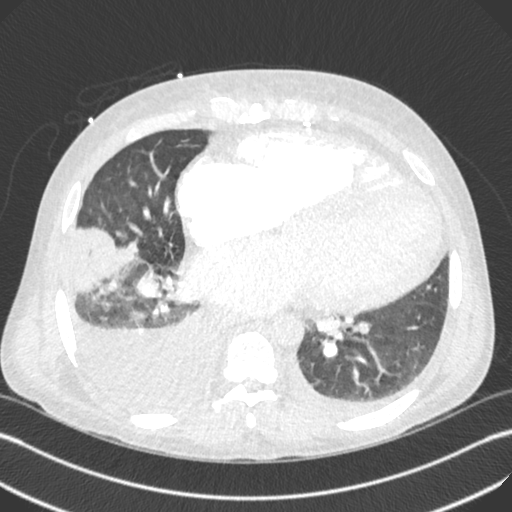
[im 223/445  soft-tissue]
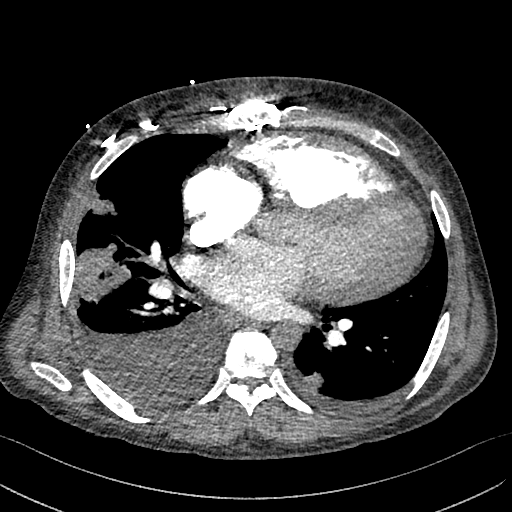
[im 247/445  lung]
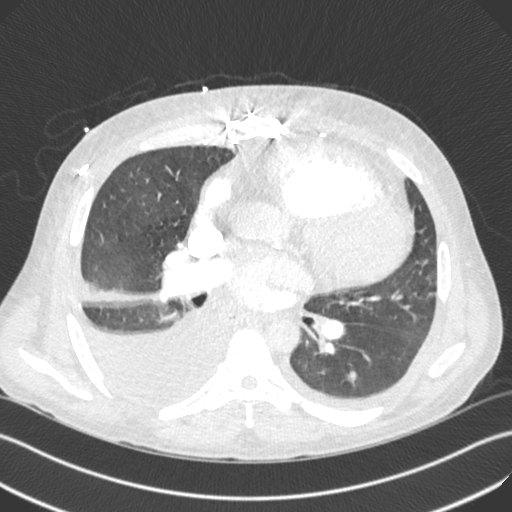
[im 272/445  soft-tissue]
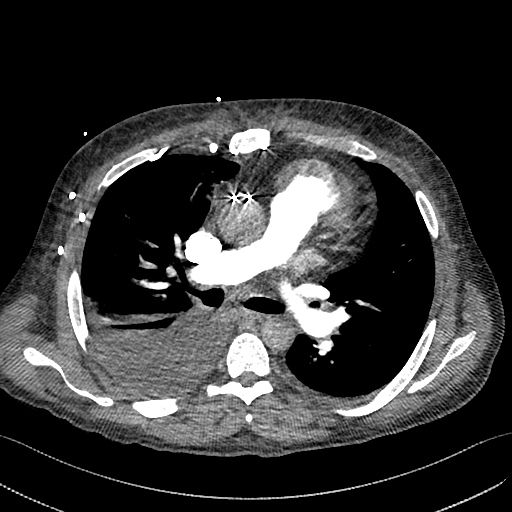
[im 297/445  lung]
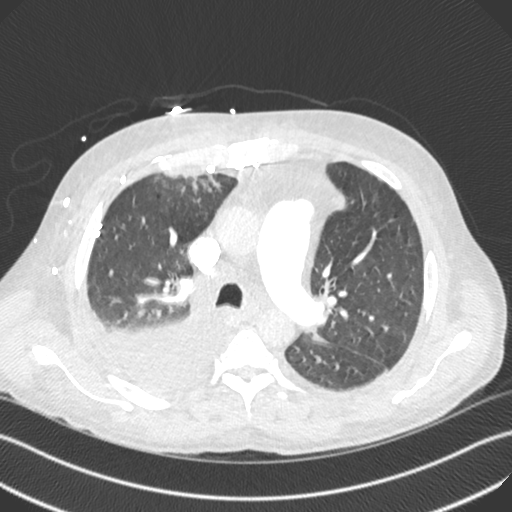
[im 346/445  soft-tissue]
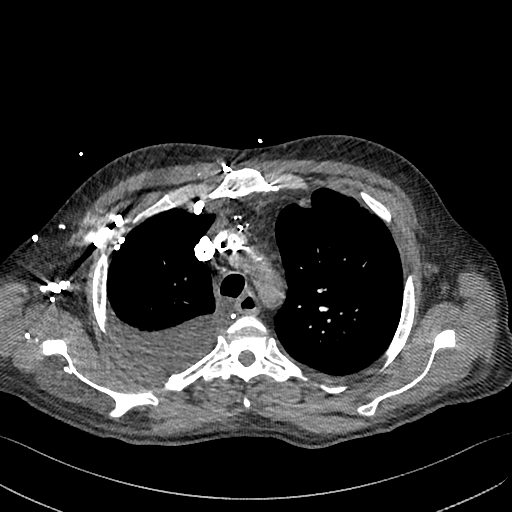
[im 371/445  lung]
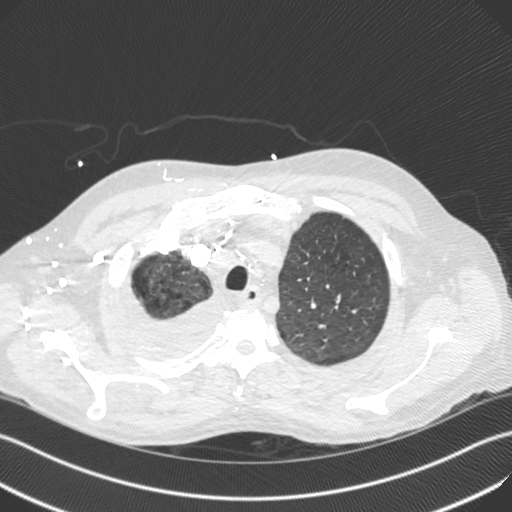
[im 395/445  soft-tissue]
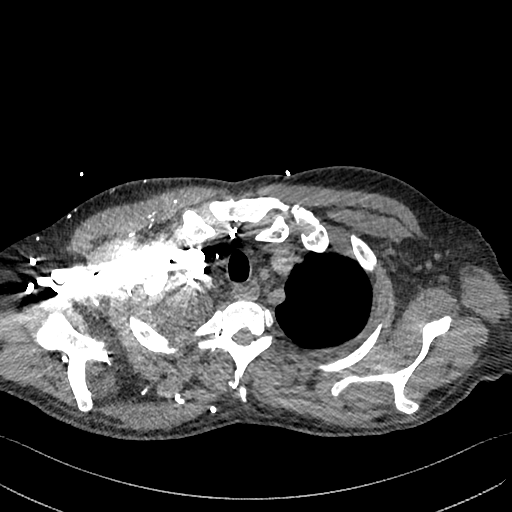
[im 420/445  lung]
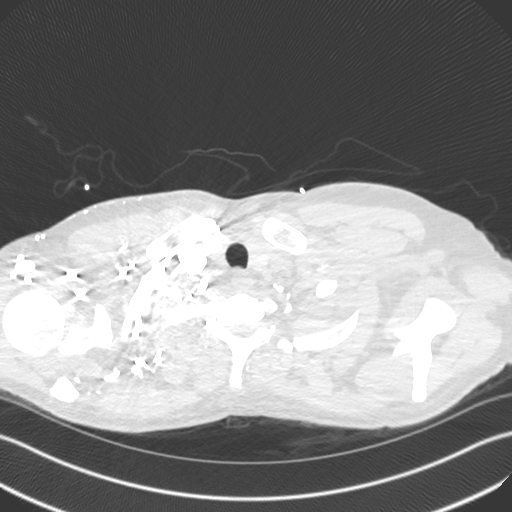

[Series 8: cor · coronal · 0.66mm/px · 3 of 135 slices shown]
[im 34/135  soft-tissue]
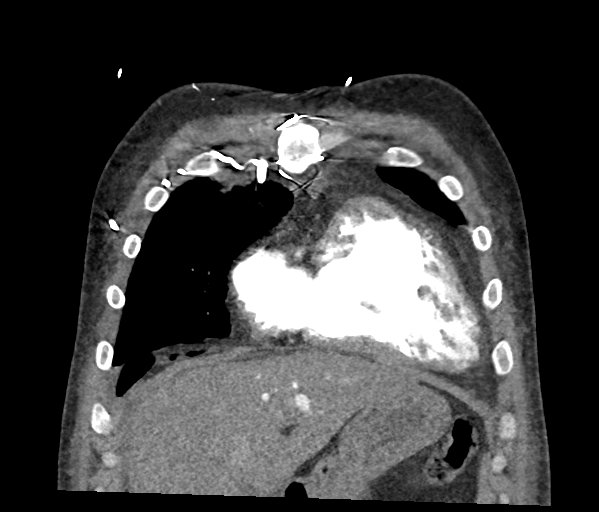
[im 68/135  soft-tissue]
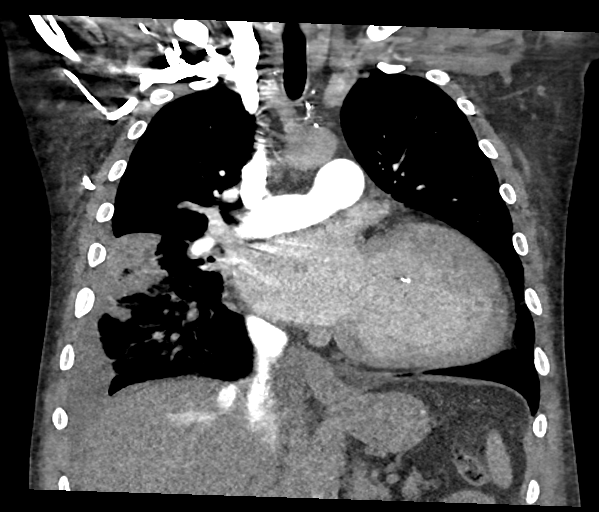
[im 101/135  soft-tissue]
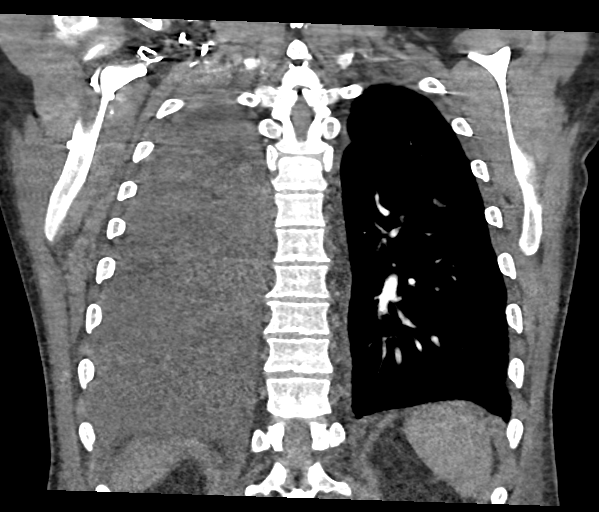

[18 of 46 positions shown; findings below may reference images not displayed]

FINDINGS: Cardiovascular: Satisfactory opacification of the central pulmonary
arteries. There is heterogeneous appearance of the distal segmental
and subsegmental pulmonary arteries which could be secondary to
chronic thromboembolism and/or mixing artifact. No evidence of
central pulmonary embolism. Heart is enlarged. Main pulmonary trunk
is dilated measuring up to 3.4 cm concerning for pulmonary arterial
hypertension. There is also reflux of contrast into the hepatic
veins concerning for right heart failure. No pericardial effusion.

Mediastinum/Nodes: No enlarged mediastinal, hilar, or axillary lymph
nodes. Thyroid gland, trachea, and esophagus demonstrate no
significant findings.

Lungs/Pleura: There is consolidation in the peripheral aspect of the
right middle lobe. There are patchy and ground-glass opacities in
the right lower lobe. There is a large right pleural effusion. The
there are also patchy and ground-glass opacities in the left lower
lobe.

Upper Abdomen: No acute abnormality.

Musculoskeletal: Sternotomy wires are noted.

Review of the MIP images confirms the above findings.
IMPRESSION: 1. No evidence of central pulmonary embolism. Evaluation of distal
segmental and subsegmental arteries is limited, which may be
secondary to chronic thromboembolism and/or mixing artifact.

2. Right middle lobe and multiple small bilateral lower lobe
opacities concerning for pneumonia. Moderate right pleural effusion.
Follow-up examination to resolution is recommended.

## 2022-12-12 ENCOUNTER — Ambulatory Visit: Payer: MEDICAID | Attending: Cardiology

## 2022-12-12 DIAGNOSIS — I4891 Unspecified atrial fibrillation: Secondary | ICD-10-CM

## 2022-12-12 DIAGNOSIS — I639 Cerebral infarction, unspecified: Secondary | ICD-10-CM

## 2022-12-12 DIAGNOSIS — Z5181 Encounter for therapeutic drug level monitoring: Secondary | ICD-10-CM | POA: Diagnosis not present

## 2022-12-12 DIAGNOSIS — Z952 Presence of prosthetic heart valve: Secondary | ICD-10-CM

## 2022-12-12 DIAGNOSIS — I82403 Acute embolism and thrombosis of unspecified deep veins of lower extremity, bilateral: Secondary | ICD-10-CM

## 2022-12-12 DIAGNOSIS — Z7901 Long term (current) use of anticoagulants: Secondary | ICD-10-CM

## 2022-12-12 LAB — POCT INR: INR: 3 (ref 2.0–3.0)

## 2022-12-12 NOTE — Patient Instructions (Signed)
Description   Continue taking 1 tablet daily EXCEPT 1.5 tablets on Sundays.  Recheck INR in 3 weeks [facility] [phone]

## 2022-12-18 ENCOUNTER — Other Ambulatory Visit (HOSPITAL_COMMUNITY): Payer: Self-pay

## 2022-12-19 ENCOUNTER — Other Ambulatory Visit: Payer: Self-pay

## 2023-01-02 ENCOUNTER — Ambulatory Visit: Payer: MEDICAID

## 2023-01-02 ENCOUNTER — Other Ambulatory Visit (HOSPITAL_COMMUNITY): Payer: Self-pay

## 2023-01-02 ENCOUNTER — Telehealth: Payer: Self-pay

## 2023-01-02 NOTE — Telephone Encounter (Signed)
Pt missed [facility]madin Clinic appt. Called pt multiple times, no answer. Left message on voicemail.

## 2023-01-08 ENCOUNTER — Other Ambulatory Visit (HOSPITAL_COMMUNITY): Payer: Self-pay

## 2023-01-09 ENCOUNTER — Ambulatory Visit: Payer: Self-pay | Attending: Cardiology

## 2023-01-09 DIAGNOSIS — Z5181 Encounter for therapeutic drug level monitoring: Secondary | ICD-10-CM

## 2023-01-09 DIAGNOSIS — I4891 Unspecified atrial fibrillation: Secondary | ICD-10-CM

## 2023-01-09 LAB — POCT INR: INR: 1.8 — AB (ref 2.0–3.0)

## 2023-01-09 NOTE — Patient Instructions (Signed)
Description   Take 2 tablets today ans then resume taking 1 tablet daily EXCEPT 1.5 tablets on Sundays.  Recheck INR in 1 week.  [facility] [phone]

## 2023-01-16 ENCOUNTER — Ambulatory Visit: Payer: Self-pay | Attending: Cardiology

## 2023-01-16 DIAGNOSIS — I639 Cerebral infarction, unspecified: Secondary | ICD-10-CM

## 2023-01-16 DIAGNOSIS — Z952 Presence of prosthetic heart valve: Secondary | ICD-10-CM

## 2023-01-16 DIAGNOSIS — Z7901 Long term (current) use of anticoagulants: Secondary | ICD-10-CM

## 2023-01-16 DIAGNOSIS — Z5181 Encounter for therapeutic drug level monitoring: Secondary | ICD-10-CM

## 2023-01-16 DIAGNOSIS — I4891 Unspecified atrial fibrillation: Secondary | ICD-10-CM

## 2023-01-16 LAB — POCT INR: INR: 2.4 (ref 2.0–3.0)

## 2023-01-16 NOTE — Patient Instructions (Signed)
Description   Take 1.5 tablets today and then resume taking 1 tablet daily EXCEPT 1.5 tablets on Sundays.  Recheck INR in 2 week.  [facility] [phone]

## 2023-01-30 ENCOUNTER — Ambulatory Visit: Payer: MEDICAID | Attending: Cardiology

## 2023-01-30 DIAGNOSIS — I4891 Unspecified atrial fibrillation: Secondary | ICD-10-CM

## 2023-01-30 DIAGNOSIS — Z5181 Encounter for therapeutic drug level monitoring: Secondary | ICD-10-CM

## 2023-01-30 DIAGNOSIS — Z952 Presence of prosthetic heart valve: Secondary | ICD-10-CM

## 2023-01-30 DIAGNOSIS — Z7901 Long term (current) use of anticoagulants: Secondary | ICD-10-CM

## 2023-01-30 DIAGNOSIS — I639 Cerebral infarction, unspecified: Secondary | ICD-10-CM

## 2023-01-30 DIAGNOSIS — I82403 Acute embolism and thrombosis of unspecified deep veins of lower extremity, bilateral: Secondary | ICD-10-CM

## 2023-01-30 LAB — POCT INR: INR: 4.9 — AB (ref 2.0–3.0)

## 2023-01-30 NOTE — Patient Instructions (Signed)
Description   HOLD tomorrow's dose and then resume taking 1 tablet daily EXCEPT 1.5 tablets on Sundays.  Recheck INR in 1 week.  [facility] Clinic [phone]

## 2023-02-01 ENCOUNTER — Other Ambulatory Visit: Payer: Self-pay

## 2023-02-01 ENCOUNTER — Other Ambulatory Visit (HOSPITAL_COMMUNITY): Payer: Self-pay

## 2023-02-06 ENCOUNTER — Ambulatory Visit: Payer: MEDICAID | Attending: Cardiology

## 2023-02-06 DIAGNOSIS — I639 Cerebral infarction, unspecified: Secondary | ICD-10-CM

## 2023-02-06 DIAGNOSIS — I4891 Unspecified atrial fibrillation: Secondary | ICD-10-CM

## 2023-02-06 DIAGNOSIS — Z5181 Encounter for therapeutic drug level monitoring: Secondary | ICD-10-CM

## 2023-02-06 DIAGNOSIS — I82403 Acute embolism and thrombosis of unspecified deep veins of lower extremity, bilateral: Secondary | ICD-10-CM

## 2023-02-06 DIAGNOSIS — Z7901 Long term (current) use of anticoagulants: Secondary | ICD-10-CM

## 2023-02-06 DIAGNOSIS — Z952 Presence of prosthetic heart valve: Secondary | ICD-10-CM

## 2023-02-06 LAB — POCT INR: INR: 2.4 (ref 2.0–3.0)

## 2023-02-06 NOTE — Patient Instructions (Addendum)
Description   Take 1.5 tablets today and then resume taking 1 tablet daily EXCEPT 1.5 tablets on Sundays.  Recheck INR in 2 weeks.  Called to schedule overdue office visit with Dr Shirlee Latch  [facility] [phone]

## 2023-02-13 ENCOUNTER — Telehealth: Payer: Self-pay

## 2023-02-13 DIAGNOSIS — Z0000 Encounter for general adult medical examination without abnormal findings: Secondary | ICD-10-CM

## 2023-02-13 NOTE — Telephone Encounter (Signed)
Returned patient call from 11/25. Was out of office. Patient did not leave a voicemail. Called to determine patient's needs. Patient did not answer. Left a message for patient to return call.   Renaee Munda, MS, ERHD, [facility]  Care Guide, Health & Wellness Coach [address], [address] [place] [address] Telephone: [phone] Email: [email or web address]

## 2023-02-19 ENCOUNTER — Other Ambulatory Visit (HOSPITAL_COMMUNITY): Payer: Self-pay | Admitting: Cardiology

## 2023-02-19 ENCOUNTER — Other Ambulatory Visit (HOSPITAL_COMMUNITY): Payer: Self-pay

## 2023-02-19 DIAGNOSIS — Z7901 Long term (current) use of anticoagulants: Secondary | ICD-10-CM

## 2023-02-19 MED ORDER — WARFARIN SODIUM 2 MG PO TABS
2.0000 mg | ORAL_TABLET | Freq: Every day | ORAL | 2 refills | Status: DC
  Filled 2023-02-19: qty 40, 26d supply, fill #0
  Filled 2023-04-23: qty 40, 26d supply, fill #1
  Filled 2023-10-22: qty 40, 26d supply, fill #2

## 2023-02-19 NOTE — Telephone Encounter (Signed)
Prescription refill request received for warfarin Lov: 05/15/22 Shirlee Latch)  Next INR check: 02/20/23  Warfarin tablet strength: 2mg   Appropriate dose. Refill sent.

## 2023-02-20 ENCOUNTER — Ambulatory Visit: Payer: MEDICAID | Attending: Cardiology

## 2023-02-20 ENCOUNTER — Telehealth (HOSPITAL_COMMUNITY): Payer: Self-pay | Admitting: Cardiology

## 2023-02-20 DIAGNOSIS — I4891 Unspecified atrial fibrillation: Secondary | ICD-10-CM | POA: Diagnosis not present

## 2023-02-20 DIAGNOSIS — Z5181 Encounter for therapeutic drug level monitoring: Secondary | ICD-10-CM

## 2023-02-20 LAB — POCT INR: INR: 5.1 — AB (ref 2.0–3.0)

## 2023-02-20 NOTE — Telephone Encounter (Signed)
Called patient at [phone] to schedule a follow up appointment - - per RN Eileen Stanford Bullins this patient needs a follow up appointment. Left a voice message on this patients telephone to call front office back to make a follow up appointment.

## 2023-02-20 NOTE — Patient Instructions (Signed)
Description   HOLD today's dose and only take 1/2 tablet tomorrow and then resume taking 1 tablet daily EXCEPT 1.5 tablets on Sundays.  Recheck INR in 1 week.  Called to schedule overdue office visit with Dr Shirlee Latch  [facility] [phone]

## 2023-02-22 ENCOUNTER — Other Ambulatory Visit: Payer: Self-pay

## 2023-02-27 ENCOUNTER — Ambulatory Visit: Payer: MEDICAID | Attending: Cardiology

## 2023-02-27 DIAGNOSIS — Z5181 Encounter for therapeutic drug level monitoring: Secondary | ICD-10-CM

## 2023-02-27 DIAGNOSIS — I4891 Unspecified atrial fibrillation: Secondary | ICD-10-CM | POA: Diagnosis not present

## 2023-02-27 LAB — POCT INR: INR: 3.1 — AB (ref 2.0–3.0)

## 2023-02-27 NOTE — Patient Instructions (Signed)
Description   Continue taking 1 tablet daily EXCEPT 1.5 tablets on Sundays.  Recheck INR in 1 week.  Called to schedule overdue office visit with Dr Shirlee Latch  [facility] [phone]

## 2023-03-06 ENCOUNTER — Ambulatory Visit: Payer: MEDICAID | Attending: Cardiology

## 2023-03-06 DIAGNOSIS — I4891 Unspecified atrial fibrillation: Secondary | ICD-10-CM | POA: Diagnosis not present

## 2023-03-06 DIAGNOSIS — Z5181 Encounter for therapeutic drug level monitoring: Secondary | ICD-10-CM | POA: Diagnosis not present

## 2023-03-06 LAB — POCT INR: INR: 3.2 — AB (ref 2.0–3.0)

## 2023-03-06 NOTE — Patient Instructions (Signed)
Description   Continue taking 1 tablet daily EXCEPT 1.5 tablets on Sundays.  Recheck INR in 4 week.  Called to schedule overdue office visit with Dr Shirlee Latch  [facility] [phone]

## 2023-03-16 ENCOUNTER — Other Ambulatory Visit (HOSPITAL_COMMUNITY): Payer: Self-pay

## 2023-03-16 ENCOUNTER — Other Ambulatory Visit (HOSPITAL_COMMUNITY): Payer: Self-pay | Admitting: Adult Health

## 2023-03-18 ENCOUNTER — Other Ambulatory Visit: Payer: Self-pay

## 2023-03-19 ENCOUNTER — Other Ambulatory Visit: Payer: Self-pay

## 2023-03-19 MED ORDER — ROSUVASTATIN CALCIUM 40 MG PO TABS
40.0000 mg | ORAL_TABLET | Freq: Every day | ORAL | 3 refills | Status: AC
  Filled 2023-03-19: qty 90, 90d supply, fill #0
  Filled 2023-10-22: qty 90, 90d supply, fill #1
  Filled 2024-02-04: qty 90, 90d supply, fill #2

## 2023-04-03 ENCOUNTER — Ambulatory Visit: Payer: MEDICAID

## 2023-04-16 ENCOUNTER — Ambulatory Visit (HOSPITAL_COMMUNITY)
Admission: RE | Admit: 2023-04-16 | Discharge: 2023-04-16 | Disposition: A | Discharge status: Home / Self Care | Payer: MEDICAID | Source: Ambulatory Visit | Attending: Cardiology | Admitting: Cardiology

## 2023-04-16 ENCOUNTER — Other Ambulatory Visit (HOSPITAL_COMMUNITY): Payer: Self-pay

## 2023-04-16 ENCOUNTER — Encounter: Payer: Self-pay | Admitting: *Deleted

## 2023-04-16 ENCOUNTER — Other Ambulatory Visit: Payer: Self-pay

## 2023-04-16 ENCOUNTER — Ambulatory Visit (HOSPITAL_BASED_OUTPATIENT_CLINIC_OR_DEPARTMENT_OTHER)
Admission: RE | Admit: 2023-04-16 | Discharge: 2023-04-16 | Disposition: A | Discharge status: Home / Self Care | Payer: MEDICAID | Source: Ambulatory Visit | Attending: Cardiology | Admitting: Cardiology

## 2023-04-16 ENCOUNTER — Encounter (HOSPITAL_COMMUNITY): Payer: Self-pay | Admitting: Cardiology

## 2023-04-16 ENCOUNTER — Telehealth (HOSPITAL_COMMUNITY): Payer: Self-pay

## 2023-04-16 VITALS — BP 170/90 | HR 79 | Wt 203.4 lb

## 2023-04-16 DIAGNOSIS — Z006 Encounter for examination for normal comparison and control in clinical research program: Secondary | ICD-10-CM

## 2023-04-16 DIAGNOSIS — J449 Chronic obstructive pulmonary disease, unspecified: Secondary | ICD-10-CM | POA: Diagnosis not present

## 2023-04-16 DIAGNOSIS — E785 Hyperlipidemia, unspecified: Secondary | ICD-10-CM | POA: Diagnosis not present

## 2023-04-16 DIAGNOSIS — Z7901 Long term (current) use of anticoagulants: Secondary | ICD-10-CM | POA: Diagnosis not present

## 2023-04-16 DIAGNOSIS — I252 Old myocardial infarction: Secondary | ICD-10-CM | POA: Insufficient documentation

## 2023-04-16 DIAGNOSIS — Z8673 Personal history of transient ischemic attack (TIA), and cerebral infarction without residual deficits: Secondary | ICD-10-CM | POA: Insufficient documentation

## 2023-04-16 DIAGNOSIS — I2510 Atherosclerotic heart disease of native coronary artery without angina pectoris: Secondary | ICD-10-CM

## 2023-04-16 DIAGNOSIS — I5022 Chronic systolic (congestive) heart failure: Secondary | ICD-10-CM

## 2023-04-16 DIAGNOSIS — F17210 Nicotine dependence, cigarettes, uncomplicated: Secondary | ICD-10-CM | POA: Diagnosis not present

## 2023-04-16 DIAGNOSIS — R000 Tachycardia, unspecified: Secondary | ICD-10-CM | POA: Insufficient documentation

## 2023-04-16 DIAGNOSIS — I059 Rheumatic mitral valve disease, unspecified: Secondary | ICD-10-CM | POA: Diagnosis not present

## 2023-04-16 DIAGNOSIS — Z952 Presence of prosthetic heart valve: Secondary | ICD-10-CM

## 2023-04-16 DIAGNOSIS — Z951 Presence of aortocoronary bypass graft: Secondary | ICD-10-CM | POA: Insufficient documentation

## 2023-04-16 LAB — ECHOCARDIOGRAM COMPLETE
AR max vel: 2.12 cm2
AV Area VTI: 2.26 cm2
AV Area mean vel: 1.96 cm2
AV Mean grad: 6 mm[Hg]
AV Peak grad: 12.7 mm[Hg]
Ao pk vel: 1.78 m/s
Area-P 1/2: 3.01 cm2
Calc EF: 39.1 %
MV VTI: 2.22 cm2
S' Lateral: 5.7 cm
Single Plane A2C EF: 34.4 %
Single Plane A4C EF: 39.2 %

## 2023-04-16 LAB — BASIC METABOLIC PANEL
Anion gap: 12 (ref 5–15)
BUN: 21 mg/dL — ABNORMAL HIGH (ref 6–20)
CO2: 20 mmol/L — ABNORMAL LOW (ref 22–32)
Calcium: 9.4 mg/dL (ref 8.9–10.3)
Chloride: 107 mmol/L (ref 98–111)
Creatinine, Ser: 1.7 mg/dL — ABNORMAL HIGH (ref 0.61–1.24)
GFR, Estimated: 50 mL/min — ABNORMAL LOW (ref >60)
Glucose, Bld: 96 mg/dL (ref 70–99)
Potassium: 5.2 mmol/L — ABNORMAL HIGH (ref 3.5–5.1)
Sodium: 139 mmol/L (ref 135–145)

## 2023-04-16 LAB — BRAIN NATRIURETIC PEPTIDE: B Natriuretic Peptide: 449.4 pg/mL — ABNORMAL HIGH (ref 0.0–100.0)

## 2023-04-16 LAB — LIPID PANEL
Cholesterol: 129 mg/dL (ref 0–200)
HDL: 58 mg/dL (ref >40)
LDL Cholesterol: 61 mg/dL (ref 0–99)
Total CHOL/HDL Ratio: 2.2 {ratio}
Triglycerides: 51 mg/dL (ref <150)
VLDL: 10 mg/dL (ref 0–40)

## 2023-04-16 MED ORDER — SPIRONOLACTONE 25 MG PO TABS
12.5000 mg | ORAL_TABLET | Freq: Every day | ORAL | 3 refills | Status: DC
Start: 2023-04-16 — End: 2023-04-16
  Filled 2023-04-16: qty 45, 90d supply, fill #0

## 2023-04-16 MED ORDER — ALBUTEROL SULFATE HFA 108 (90 BASE) MCG/ACT IN AERS
2.0000 | INHALATION_SPRAY | Freq: Four times a day (QID) | RESPIRATORY_TRACT | 2 refills | Status: DC | PRN
  Filled 2023-04-16: qty 18, 28d supply, fill #0
  Filled 2023-10-22: qty 18, 28d supply, fill #1
  Filled 2024-02-04: qty 6.7, 25d supply, fill #2
  Filled 2024-03-04: qty 6.7, 25d supply, fill #3

## 2023-04-16 MED ORDER — VARENICLINE TARTRATE (STARTER) 0.5 MG X 11 & 1 MG X 42 PO TBPK
ORAL_TABLET | ORAL | 3 refills | Status: AC
  Filled 2023-04-16: qty 53, 28d supply, fill #0
  Filled 2023-10-22: qty 53, 28d supply, fill #1
  Filled 2024-02-04: qty 53, 28d supply, fill #2

## 2023-04-16 MED ORDER — ENTRESTO 97-103 MG PO TABS
1.0000 | ORAL_TABLET | Freq: Two times a day (BID) | ORAL | 3 refills | Status: AC
  Filled 2023-04-16: qty 180, 90d supply, fill #0
  Filled 2023-10-22: qty 180, 90d supply, fill #1
  Filled 2024-02-04 (×2): qty 180, 90d supply, fill #2

## 2023-04-16 MED ORDER — METOPROLOL SUCCINATE ER 50 MG PO TB24
50.0000 mg | ORAL_TABLET | Freq: Every day | ORAL | 5 refills | Status: AC
  Filled 2023-04-16: qty 30, 30d supply, fill #0
  Filled 2023-06-04: qty 30, 30d supply, fill #1
  Filled 2023-10-22: qty 30, 30d supply, fill #2
  Filled 2024-02-04: qty 30, 30d supply, fill #3
  Filled 2024-03-04: qty 30, 30d supply, fill #4
  Filled 2024-03-26: qty 30, 30d supply, fill #5

## 2023-04-16 NOTE — Patient Instructions (Signed)
TAKE chantix as directed on box.  INCREASE Entresto to 97/103 mg Twice daily  START Spironolactone 12.5 mg ( 1/2 tab ) daily.  Labs done today, your results will be available in MyChart, we will contact you for abnormal readings.  Repeat blood work in 10 days.  Your provider has ordered ultra sound of your legs.you will be called to have this test arranged.  You have been referred to the Electrophysiologist in [place]. They will call you to arrange your appointment.  Your physician recommends that you schedule a follow-up appointment in: 6 weeks.  If you have any questions or concerns before your next appointment please send [place] a message through [place] or call our office at [phone].    TO LEAVE A MESSAGE FOR THE NURSE SELECT OPTION 2, PLEASE LEAVE A MESSAGE INCLUDING: YOUR NAME DATE OF BIRTH CALL BACK NUMBER REASON FOR CALL**this is important as we prioritize the call backs  YOU WILL RECEIVE A CALL BACK THE SAME DAY AS LONG AS YOU CALL BEFORE 4:00 PM  At the [facility], you and your health needs are our priority. As part of our continuing mission to provide you with exceptional heart care, we have created designated Provider Care Teams. These Care Teams include your primary Cardiologist (physician) and Advanced Practice Providers (APPs- Physician Assistants and Nurse Practitioners) who all work together to provide you with the care you need, when you need it.   You may see any of the following providers on your designated Care Team at your next follow up: Dr Arvilla Meres Dr Marca Ancona Dr. Dorthula Nettles Dr. Clearnce Hasten Amy Filbert Schilder, NP Robbie Lis, [place] [facility] [place], [place] Brynda Peon, NP [place] Lee, NP Karle Plumber, PharmD   Please be sure to bring in all your medications bottles to every appointment.    Thank you for choosing [facility]

## 2023-04-16 NOTE — Progress Notes (Signed)
*  PRELIMINARY RESULTS* Echocardiogram 2D Echocardiogram has been performed.  Mark Garrett 04/16/2023, 2:04 PM

## 2023-04-16 NOTE — Telephone Encounter (Signed)
-----   Message from Marca Ancona sent at 04/16/2023  5:48 PM EST ----- K is high.  Do not start spironolactone yet.  Instead, have him increase Toprol XL to 50 mg daily.
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
## 2023-04-16 NOTE — Telephone Encounter (Signed)
Patient advised and verbalized understanding. New Rx sent into patients pharmacy.   Meds ordered this encounter  Medications   metoprolol succinate (TOPROL-XL) 50 MG 24 hr tablet    Sig: Take 1 tablet (50 mg total) by mouth daily.    Dispense:  30 tablet    Refill:  5    Please mail to patient, please call patient when it is being mailed. Please cancel all previous orders for current medication. Change in dosage or pill size.

## 2023-04-16 NOTE — Research (Signed)
SITE: [id number]     Subject #_ [id number]    Subprotocol: A  Inclusion Criteria  Patients who meet all of the following criteria are eligible for enrollment as study participants:  Yes No  Age > 45 years old X   Eligible to wear Holter Study X    Exclusion Criteria  Patients who meet any of these criteria are not eligible for enrollment as study participants: Yes No  1. Receiving any mechanical (respiratory or circulatory) or renal support therapy at Screening or during Visit #1.  X  2.  Any other conditions that in the opinion of the investigators are likely to prevent compliance with the study protocol or pose a safety concern if the subject participates in the study.  X  3. Poor tolerance, namely susceptible to severe skin allergies from ECG adhesive patch application.  X   Protocol: REV H                                     Residential Zip code*  _ [address]_ (First 3 digits ONLY)                                             [facility] Informed Consent   Subject Name: [facility]  Subject met inclusion and exclusion criteria.  The informed consent form, study requirements and expectations were reviewed with the subject. Subject had opportunity to read consent and questions and concerns were addressed prior to the signing of the consent form.  The subject verbalized understanding of the trial requirements.  The subject agreed to participate in the [facility] EF trial and signed the informed consent at 1400 on 04/16/2023.  The informed consent was obtained prior to performance of any protocol-specific procedures for the subject.  A copy of the signed informed consent was given to the subject and a copy was placed in the subject's medical record.   [place] [facility]          Current Outpatient Medications:    albuterol (VENTOLIN HFA) 108 (90 Base) MCG/ACT inhaler, Inhale 2 puffs into the lungs every 6 (six) hours as needed for wheezing or shortness of breath., Disp: 18 g, Rfl: 2   aspirin 81  MG chewable tablet, Chew 1 tablet (81 mg total) by mouth daily., Disp: 90 tablet, Rfl: 3   empagliflozin (JARDIANCE) 10 MG TABS tablet, Take 1 tablet (10 mg total) by mouth daily before breakfast., Disp: 90 tablet, Rfl: 3   metoprolol succinate (TOPROL-XL) 25 MG 24 hr tablet, Take 1 tablet (25 mg total) by mouth daily., Disp: 90 tablet, Rfl: 3   rosuvastatin (CRESTOR) 40 MG tablet, Take 1 tablet (40 mg total) by mouth daily., Disp: 90 tablet, Rfl: 3   sacubitril-valsartan (ENTRESTO) 97-103 MG, Take 1 tablet by mouth 2 (two) times daily., Disp: 180 tablet, Rfl: 3   sildenafil (REVATIO) 20 MG tablet, Take 2 tablets (40 mg total) by mouth 3 (three) times daily., Disp: 180 tablet, Rfl: 6   spironolactone (ALDACTONE) 25 MG tablet, Take 0.5 tablets (12.5 mg total) by mouth daily., Disp: 45 tablet, Rfl: 3   Varenicline Tartrate, Starter, (CHANTIX STARTING MONTH PAK) 0.5 MG X 11 & 1 MG X 42 TBPK, Day 1-3 .5mg  daily, Day 4-7 .5mg  Twice daily, Day 8 onward  1 mg Twice daily, Disp: 53 each, Rfl: 3   warfarin (COUMADIN) 2 MG tablet, Take 1-1.5 tablets (2-3 mg total) by mouth daily AS DIRECTED BY COUMADIN CLINIC, Disp: 40 tablet, Rfl: 2

## 2023-04-17 ENCOUNTER — Other Ambulatory Visit (HOSPITAL_COMMUNITY): Payer: Self-pay

## 2023-04-17 ENCOUNTER — Other Ambulatory Visit: Payer: Self-pay

## 2023-04-17 ENCOUNTER — Ambulatory Visit: Payer: MEDICAID | Attending: Cardiology

## 2023-04-17 DIAGNOSIS — Z5181 Encounter for therapeutic drug level monitoring: Secondary | ICD-10-CM

## 2023-04-17 DIAGNOSIS — I4891 Unspecified atrial fibrillation: Secondary | ICD-10-CM

## 2023-04-17 LAB — POCT INR: INR: 1.3 — AB (ref 2.0–3.0)

## 2023-04-17 NOTE — Patient Instructions (Signed)
Description   Take 2 tablets today and 1.5 tablets tomorrow and then continue taking 1 tablet daily EXCEPT 1.5 tablets on Sundays.  Recheck INR in 1 week.  Called to schedule overdue office visit with Dr Shirlee Latch  [facility]in Clinic [phone]

## 2023-04-23 ENCOUNTER — Other Ambulatory Visit: Payer: Self-pay

## 2023-04-23 ENCOUNTER — Other Ambulatory Visit (HOSPITAL_COMMUNITY): Payer: Self-pay | Admitting: Adult Health

## 2023-04-24 ENCOUNTER — Other Ambulatory Visit (HOSPITAL_COMMUNITY): Payer: Self-pay

## 2023-04-24 ENCOUNTER — Ambulatory Visit: Payer: MEDICAID | Attending: Cardiology

## 2023-04-24 DIAGNOSIS — I4891 Unspecified atrial fibrillation: Secondary | ICD-10-CM

## 2023-04-24 LAB — POCT INR: INR: 3.3 — AB (ref 2.0–3.0)

## 2023-04-24 MED ORDER — SILDENAFIL CITRATE 20 MG PO TABS
40.0000 mg | ORAL_TABLET | Freq: Three times a day (TID) | ORAL | 6 refills | Status: AC
  Filled 2023-04-24 – 2023-06-04 (×2): qty 180, 30d supply, fill #0

## 2023-04-24 NOTE — Patient Instructions (Signed)
Description   Continue taking 1 tablet daily EXCEPT 1.5 tablets on Sundays.  Recheck INR in 3 weeks.  Called to schedule overdue office visit with Dr Shirlee Latch  C[facility]madin Clinic [phone]

## 2023-04-26 ENCOUNTER — Other Ambulatory Visit (HOSPITAL_COMMUNITY): Payer: Self-pay

## 2023-04-26 ENCOUNTER — Telehealth (HOSPITAL_COMMUNITY): Payer: Self-pay | Admitting: Pharmacy Technician

## 2023-04-26 NOTE — Telephone Encounter (Signed)
 [facility] Patient Advocate Encounter  Received PA request for Sildenafil . Patient does not meet the criteria for PA approval through insurance. Should pay out of pocket for the medication. Message delivered to pharmacy as well.  Almarie JULIANNA Pa, CPhT

## 2023-04-30 ENCOUNTER — Other Ambulatory Visit (HOSPITAL_COMMUNITY): Payer: Self-pay

## 2023-05-15 ENCOUNTER — Other Ambulatory Visit: Payer: Self-pay | Admitting: Cardiology

## 2023-05-15 ENCOUNTER — Ambulatory Visit (INDEPENDENT_AMBULATORY_CARE_PROVIDER_SITE_OTHER): Payer: MEDICAID

## 2023-05-15 ENCOUNTER — Ambulatory Visit: Payer: MEDICAID | Attending: Cardiology

## 2023-05-15 DIAGNOSIS — I5022 Chronic systolic (congestive) heart failure: Secondary | ICD-10-CM

## 2023-05-15 DIAGNOSIS — I4891 Unspecified atrial fibrillation: Secondary | ICD-10-CM

## 2023-05-15 DIAGNOSIS — I739 Peripheral vascular disease, unspecified: Secondary | ICD-10-CM

## 2023-05-15 DIAGNOSIS — I2510 Atherosclerotic heart disease of native coronary artery without angina pectoris: Secondary | ICD-10-CM | POA: Diagnosis not present

## 2023-05-15 LAB — POCT INR: INR: 6 — AB (ref 2.0–3.0)

## 2023-05-15 NOTE — Patient Instructions (Addendum)
 Description   HOLD Warfarin tomorrow, Thursday, and Friday and then Continue taking 1 tablet daily EXCEPT 1.5 tablets on Sundays.  Recheck INR in 1 week  Called to schedule overdue office visit with Dr Shirlee Latch  C[facility]in Clinic [phone]

## 2023-05-17 ENCOUNTER — Other Ambulatory Visit (HOSPITAL_COMMUNITY): Payer: Self-pay | Admitting: Family Medicine

## 2023-05-17 ENCOUNTER — Other Ambulatory Visit (HOSPITAL_COMMUNITY): Payer: Self-pay | Admitting: Cardiology

## 2023-05-17 ENCOUNTER — Other Ambulatory Visit (HOSPITAL_COMMUNITY): Payer: Self-pay

## 2023-05-17 ENCOUNTER — Other Ambulatory Visit (HOSPITAL_COMMUNITY): Payer: Self-pay | Admitting: Adult Health

## 2023-05-17 DIAGNOSIS — Z7901 Long term (current) use of anticoagulants: Secondary | ICD-10-CM

## 2023-05-17 MED ORDER — WARFARIN SODIUM 2 MG PO TABS
2.0000 mg | ORAL_TABLET | Freq: Every day | ORAL | 2 refills | Status: DC
  Filled 2023-05-17: qty 35, 23d supply, fill #0
  Filled 2023-10-22: qty 35, 23d supply, fill #1

## 2023-05-17 NOTE — Telephone Encounter (Signed)
 Prescription refill request received for warfarin [place]: 04/16/23 Shirlee Latch)  Next INR check: 05/22/23 Warfarin tablet strength: 2mg   Appropriate dose. Refill sent.

## 2023-05-18 ENCOUNTER — Other Ambulatory Visit: Payer: Self-pay

## 2023-05-18 MED ORDER — SPIRONOLACTONE 25 MG PO TABS
25.0000 mg | ORAL_TABLET | Freq: Every day | ORAL | 3 refills | Status: DC
  Filled 2023-05-18: qty 30, 30d supply, fill #0
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
## 2023-05-22 ENCOUNTER — Other Ambulatory Visit (HOSPITAL_COMMUNITY): Payer: Self-pay | Admitting: *Deleted

## 2023-05-22 ENCOUNTER — Telehealth (HOSPITAL_COMMUNITY): Payer: Self-pay | Admitting: *Deleted

## 2023-05-22 ENCOUNTER — Ambulatory Visit: Payer: MEDICAID

## 2023-05-22 ENCOUNTER — Ambulatory Visit: Payer: MEDICAID | Attending: Cardiology

## 2023-05-22 DIAGNOSIS — I739 Peripheral vascular disease, unspecified: Secondary | ICD-10-CM | POA: Diagnosis not present

## 2023-05-22 DIAGNOSIS — I5022 Chronic systolic (congestive) heart failure: Secondary | ICD-10-CM

## 2023-05-22 DIAGNOSIS — I4891 Unspecified atrial fibrillation: Secondary | ICD-10-CM

## 2023-05-22 LAB — POCT INR: INR: 5.5 — AB (ref 2.0–3.0)

## 2023-05-22 NOTE — Patient Instructions (Addendum)
 Description   HOLD Warfarin tomorrow, Thursday, and Friday and then START taking 1 tablet daily.  Recheck INR in 2 weeks Called to schedule overdue office visit with Dr Shirlee Latch  Coumadin Clinic [phone]

## 2023-05-22 NOTE — Telephone Encounter (Signed)
 Per Dr. Shirlee Latch after reviewing lower extremity arterial duplex:  "This patient may have a critical aortic aneurysm.  He needs a BMET done in [place] to make sure creatinine is stable, then will need CTA abdomen/pelvis for abdominal aortic aneurysm.  Can do in [place] if possible."  Attempted to call patient several times, messages left each time (no emergency contact) asking him to call [phone] option 2 for results and scheduled urgent follow up test.  Unable to schedule CTA in [place] (per centralized radiology scheduler - "no answer"). CTA scheduled tomorrow at [facility] at 11:45. BMP ordered and scheduled tomorrow at 10:30 here. Advised radiology scheduler of patients iodine allergy.

## 2023-05-22 NOTE — Addendum Note (Signed)
 Addended by: Hessie Diener B on: 05/22/2023 02:55 PM   Modules accepted: Orders

## 2023-05-23 ENCOUNTER — Ambulatory Visit (HOSPITAL_COMMUNITY): Admission: RE | Admit: 2023-05-23 | Payer: MEDICAID | Source: Ambulatory Visit

## 2023-05-23 ENCOUNTER — Other Ambulatory Visit (HOSPITAL_COMMUNITY): Payer: MEDICAID

## 2023-05-28 ENCOUNTER — Ambulatory Visit (HOSPITAL_COMMUNITY)
Admission: RE | Admit: 2023-05-28 | Discharge: 2023-05-28 | Disposition: A | Discharge status: Home / Self Care | Payer: MEDICAID | Source: Ambulatory Visit | Attending: Family Medicine | Admitting: Family Medicine

## 2023-05-28 ENCOUNTER — Encounter (HOSPITAL_COMMUNITY): Payer: Self-pay

## 2023-05-28 ENCOUNTER — Other Ambulatory Visit (HOSPITAL_COMMUNITY): Payer: Self-pay

## 2023-05-28 ENCOUNTER — Other Ambulatory Visit: Payer: Self-pay

## 2023-05-28 VITALS — BP 120/88 | HR 83 | Ht 68.0 in | Wt 194.6 lb

## 2023-05-28 DIAGNOSIS — I2510 Atherosclerotic heart disease of native coronary artery without angina pectoris: Secondary | ICD-10-CM | POA: Insufficient documentation

## 2023-05-28 DIAGNOSIS — Z7984 Long term (current) use of oral hypoglycemic drugs: Secondary | ICD-10-CM | POA: Insufficient documentation

## 2023-05-28 DIAGNOSIS — I2721 Secondary pulmonary arterial hypertension: Secondary | ICD-10-CM

## 2023-05-28 DIAGNOSIS — F17210 Nicotine dependence, cigarettes, uncomplicated: Secondary | ICD-10-CM | POA: Diagnosis not present

## 2023-05-28 DIAGNOSIS — Z5986 Financial insecurity: Secondary | ICD-10-CM | POA: Insufficient documentation

## 2023-05-28 DIAGNOSIS — Z7901 Long term (current) use of anticoagulants: Secondary | ICD-10-CM | POA: Diagnosis not present

## 2023-05-28 DIAGNOSIS — Z8673 Personal history of transient ischemic attack (TIA), and cerebral infarction without residual deficits: Secondary | ICD-10-CM | POA: Diagnosis not present

## 2023-05-28 DIAGNOSIS — I255 Ischemic cardiomyopathy: Secondary | ICD-10-CM | POA: Diagnosis not present

## 2023-05-28 DIAGNOSIS — Z5982 Transportation insecurity: Secondary | ICD-10-CM | POA: Diagnosis not present

## 2023-05-28 DIAGNOSIS — Z7982 Long term (current) use of aspirin: Secondary | ICD-10-CM | POA: Insufficient documentation

## 2023-05-28 DIAGNOSIS — I739 Peripheral vascular disease, unspecified: Secondary | ICD-10-CM | POA: Diagnosis not present

## 2023-05-28 DIAGNOSIS — Z86718 Personal history of other venous thrombosis and embolism: Secondary | ICD-10-CM | POA: Insufficient documentation

## 2023-05-28 DIAGNOSIS — Z951 Presence of aortocoronary bypass graft: Secondary | ICD-10-CM | POA: Insufficient documentation

## 2023-05-28 DIAGNOSIS — I10 Essential (primary) hypertension: Secondary | ICD-10-CM

## 2023-05-28 DIAGNOSIS — I5022 Chronic systolic (congestive) heart failure: Secondary | ICD-10-CM | POA: Diagnosis present

## 2023-05-28 DIAGNOSIS — F1010 Alcohol abuse, uncomplicated: Secondary | ICD-10-CM

## 2023-05-28 DIAGNOSIS — Z8679 Personal history of other diseases of the circulatory system: Secondary | ICD-10-CM

## 2023-05-28 DIAGNOSIS — I871 Compression of vein: Secondary | ICD-10-CM

## 2023-05-28 DIAGNOSIS — Z952 Presence of prosthetic heart valve: Secondary | ICD-10-CM | POA: Insufficient documentation

## 2023-05-28 DIAGNOSIS — Z79899 Other long term (current) drug therapy: Secondary | ICD-10-CM | POA: Diagnosis not present

## 2023-05-28 DIAGNOSIS — S98919A Complete traumatic amputation of unspecified foot, level unspecified, initial encounter: Secondary | ICD-10-CM

## 2023-05-28 DIAGNOSIS — Z720 Tobacco use: Secondary | ICD-10-CM

## 2023-05-28 DIAGNOSIS — I714 Abdominal aortic aneurysm, without rupture, unspecified: Secondary | ICD-10-CM

## 2023-05-28 LAB — BRAIN NATRIURETIC PEPTIDE: B Natriuretic Peptide: 154.8 pg/mL — ABNORMAL HIGH (ref 0.0–100.0)

## 2023-05-28 LAB — BASIC METABOLIC PANEL
Anion gap: 9 (ref 5–15)
BUN: 47 mg/dL — ABNORMAL HIGH (ref 6–20)
CO2: 21 mmol/L — ABNORMAL LOW (ref 22–32)
Calcium: 9.2 mg/dL (ref 8.9–10.3)
Chloride: 107 mmol/L (ref 98–111)
Creatinine, Ser: 3.16 mg/dL — ABNORMAL HIGH (ref 0.61–1.24)
GFR, Estimated: 24 mL/min — ABNORMAL LOW (ref >60)
Glucose, Bld: 114 mg/dL — ABNORMAL HIGH (ref 70–99)
Potassium: 4.2 mmol/L (ref 3.5–5.1)
Sodium: 137 mmol/L (ref 135–145)

## 2023-05-28 MED ORDER — PREDNISONE 50 MG PO TABS
ORAL_TABLET | ORAL | 0 refills | Status: AC
  Filled 2023-05-28: qty 3, 2d supply, fill #0

## 2023-05-28 MED ORDER — DIPHENHYDRAMINE HCL 50 MG PO CAPS
ORAL_CAPSULE | ORAL | 0 refills | Status: AC
  Filled 2023-05-28: qty 1, 1d supply, fill #0

## 2023-05-28 NOTE — Progress Notes (Signed)
 Medication Samples have been provided to the patient.  Drug name: LOKELMA       Strength: 10GRAM        Qty: 1 PACKET  LOT: [id number]  Exp.Date: 08/17/2024  Dosing instructions: DO NOT TAKE UNLESS DIRECTED BY HEART FAILURE CLINIC  The patient has been instructed regarding the correct time, dose, and frequency of taking this medication, including desired effects and most common side effects.   Callee Rohrig B Ishani Goldwasser 3:13 PM 05/28/2023

## 2023-05-28 NOTE — Progress Notes (Signed)
 Attempted to get patient scheduled for CTA abd, pelvis due to aorta size on recent testing.   Patient reports he can not come back to [place] this week- called centralized scheduling to get this scheduled in [place]- per scheduler patient can not have scan there due to IV contrast allergy.   Attempted to get patient scheduled in [place] at hospital but patient unable to return to have this done this week and is unsure about next week as patient reports that he may be going to jail tomorrow and has had a death in the family.  Patient also requires pre medications for CTA so will require a driver for scan.   Advised patient to call us tomorrow to try and arrange this once he works out transportation and or any other issues hindering him from having scan.   Went ahead and sent in pre medications so that patient would have these- and educated patient on the need and use for these. Patient aware of all instructions and verbalized that he will call back to attempt to get this scan scheduled.

## 2023-05-28 NOTE — Patient Instructions (Addendum)
 Medication Changes:  STOP TAKING SPIRONOLACTONE   SAMPLE OF LOKELMA GIVEN TODAY- DO NOT TAKE THIS UNLESS WE CALL AND TELL YOU TO  PLEASE FOLLOW MEDICATION INSTRUCTIONS FOR PRE MEDICATIONS FOR CTA SCAN   Patient will need a prescription for Prednisone and very clear instructions (as follows): Prednisone 50 mg - take 13 hours prior to test Take another Prednisone 50 mg 7 hours prior to test Take another Prednisone 50 mg 1 hour prior to test Take Benadryl 50 mg 1 hour prior to test Patient must complete all four doses of above prophylactic medications. Patient will need a ride after test due to Benadryl.  Lab Work:  Labs done today, your results will be available in MyChart, we will contact you for abnormal readings.  Testing/Procedures:  CALL OUR OFFICE [phone] OPTION 2 TO GET YOUR CTA SCAN SCHEDULED   Follow-Up in: 3 MONTHS WITH DR. Shirlee Latch PLEASE CALL OUR OFFICE AROUND APRIL TO GET SCHEDULED FOR YOUR APPOINTMENT. PHONE NUMBER IS [phone] OPTION 2   At the [facility], you and your health needs are our priority. We have a designated team specialized in the treatment of Heart Failure. This Care Team includes your primary Heart Failure Specialized Cardiologist (physician), Advanced Practice Providers (APPs- Physician Assistants and Nurse Practitioners), and Pharmacist who all work together to provide you with the care you need, when you need it.   You may see any of the following providers on your designated Care Team at your next follow up:  Dr. Arvilla Meres Dr. Marca Ancona Dr. Dorthula Nettles Dr. Theresia Bough Tonye Becket, NP Robbie Lis, [facility] [place], [place] Brynda Peon, NP [place] Lee, NP Karle Plumber, PharmD   Please be sure to bring in all your medications bottles to every appointment.   Need to Contact us:  If you have any questions or concerns before your next appointment please send [place] a message through [place] or  call our office at [phone].    TO LEAVE A MESSAGE FOR THE NURSE SELECT OPTION 2, PLEASE LEAVE A MESSAGE INCLUDING: YOUR NAME DATE OF BIRTH CALL BACK NUMBER REASON FOR CALL**this is important as we prioritize the call backs  YOU WILL RECEIVE A CALL BACK THE SAME DAY AS LONG AS YOU CALL BEFORE 4:00 PM

## 2023-05-29 ENCOUNTER — Other Ambulatory Visit (HOSPITAL_COMMUNITY): Payer: Self-pay

## 2023-05-29 ENCOUNTER — Other Ambulatory Visit: Payer: Self-pay

## 2023-05-30 ENCOUNTER — Ambulatory Visit (HOSPITAL_COMMUNITY): Payer: MEDICAID

## 2023-05-30 ENCOUNTER — Other Ambulatory Visit (HOSPITAL_COMMUNITY): Payer: Self-pay

## 2023-05-31 ENCOUNTER — Other Ambulatory Visit (HOSPITAL_BASED_OUTPATIENT_CLINIC_OR_DEPARTMENT_OTHER): Payer: MEDICAID | Admitting: Radiology

## 2023-06-01 NOTE — Addendum Note (Signed)
 Encounter addended by: Howell Rucks, RDCS on: 06/01/2023 3:15 PM  Actions taken: Imaging Exam ended

## 2023-06-04 ENCOUNTER — Other Ambulatory Visit: Payer: Self-pay

## 2023-06-04 ENCOUNTER — Telehealth: Payer: Self-pay | Admitting: Pharmacy Technician

## 2023-06-04 ENCOUNTER — Other Ambulatory Visit (HOSPITAL_COMMUNITY): Payer: Self-pay | Admitting: Cardiology

## 2023-06-04 ENCOUNTER — Other Ambulatory Visit (HOSPITAL_COMMUNITY): Payer: Self-pay

## 2023-06-04 MED ORDER — EMPAGLIFLOZIN 10 MG PO TABS
10.0000 mg | ORAL_TABLET | Freq: Every day | ORAL | 3 refills | Status: AC
  Filled 2023-06-04: qty 90, 90d supply, fill #0
  Filled 2023-10-22: qty 90, 90d supply, fill #1
  Filled 2024-02-04: qty 90, 90d supply, fill #2

## 2023-06-04 NOTE — Telephone Encounter (Signed)
 Pharmacy Patient Advocate Encounter   Received notification from Fax that prior authorization for Jardiance is required/requested.   Insurance verification completed.   The patient is insured through [facility] .   Per test claim: PA required; PA submitted to above mentioned insurance via CoverMyMeds Key/confirmation #/EOC [id number] Status is pending

## 2023-06-05 ENCOUNTER — Ambulatory Visit: Payer: MEDICAID | Attending: Cardiology

## 2023-06-05 ENCOUNTER — Telehealth (HOSPITAL_COMMUNITY): Payer: Self-pay

## 2023-06-05 ENCOUNTER — Other Ambulatory Visit (HOSPITAL_COMMUNITY): Payer: Self-pay

## 2023-06-05 DIAGNOSIS — I4891 Unspecified atrial fibrillation: Secondary | ICD-10-CM

## 2023-06-05 LAB — POCT INR: INR: 2.9 (ref 2.0–3.0)

## 2023-06-05 NOTE — Telephone Encounter (Signed)
 Pharmacy Patient Advocate Encounter  Received notification from [facility] that Prior Authorization for [place] has been APPROVED from 06/04/23 to 06/03/24   PA #/Case ID/Reference #: [id number]

## 2023-06-05 NOTE — Patient Instructions (Signed)
 Description   Continue taking 1 tablet daily.  Recheck INR in 2 weeks Called to schedule overdue office visit with Dr Shirlee Latch  Coumadin Clinic [phone]

## 2023-06-12 ENCOUNTER — Other Ambulatory Visit: Payer: Self-pay

## 2023-06-12 ENCOUNTER — Other Ambulatory Visit (HOSPITAL_COMMUNITY): Payer: Self-pay

## 2023-06-14 ENCOUNTER — Encounter (HOSPITAL_BASED_OUTPATIENT_CLINIC_OR_DEPARTMENT_OTHER): Payer: Self-pay

## 2023-06-14 ENCOUNTER — Other Ambulatory Visit (HOSPITAL_COMMUNITY): Payer: Self-pay

## 2023-06-14 ENCOUNTER — Telehealth (HOSPITAL_COMMUNITY): Payer: Self-pay | Admitting: *Deleted

## 2023-06-14 ENCOUNTER — Telehealth (HOSPITAL_COMMUNITY): Payer: Self-pay | Admitting: Cardiology

## 2023-06-14 ENCOUNTER — Ambulatory Visit (HOSPITAL_BASED_OUTPATIENT_CLINIC_OR_DEPARTMENT_OTHER)
Admission: RE | Admit: 2023-06-14 | Discharge: 2023-06-14 | Disposition: A | Discharge status: Home / Self Care | Payer: MEDICAID | Source: Ambulatory Visit | Attending: Cardiology | Admitting: Cardiology

## 2023-06-14 DIAGNOSIS — I5022 Chronic systolic (congestive) heart failure: Secondary | ICD-10-CM

## 2023-06-14 DIAGNOSIS — I739 Peripheral vascular disease, unspecified: Secondary | ICD-10-CM

## 2023-06-14 LAB — I-STAT CREATININE (MANUAL ENTRY): Creatinine, Ser: 5.2 — AB (ref 0.50–1.10)

## 2023-06-14 NOTE — Telephone Encounter (Signed)
 Wellness check follow up Per Sgt.Moore Patient was notified at approx 1423  Per patient and patients brother, will reports to ER at [facility]

## 2023-06-14 NOTE — Telephone Encounter (Signed)
 Per Dr Shirlee Latch He should also go to the ER with marked rise in creatinine. He can get the CT in the ER. Would advise that he go today, would go to the [facility] or [facility] ER.

## 2023-06-14 NOTE — Telephone Encounter (Signed)
 Called [facility] Department dispatch requesting a wellness check for this patient per Dr. Shirlee Latch.  Pt arrived to hospital earlier today for CT abd and pelvis and was found to have an elevated creatinine of 5.2. Radiology was unable to perform CT as ordered.   Per Dr. Shirlee Latch' s instructions:  "Patient should also go to the ER with marked rise in creatinine. He can get the CT in the ER. Would advise that he go today, would go to the [facility] or [facility] ER."  We have been unable to reach patient or leave message on phone.   We have contacted [facility] dispatcher at [phone] and asked for welfare check per Dr. Alford Highland request. We have asked that patient be instructed to go to ED. Asked deputy to call us at [phone] for questions and confirmation.
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
## 2023-06-18 ENCOUNTER — Other Ambulatory Visit (HOSPITAL_COMMUNITY): Payer: Self-pay

## 2023-06-18 ENCOUNTER — Other Ambulatory Visit: Payer: Self-pay

## 2023-06-18 MED ORDER — ATORVASTATIN CALCIUM 40 MG PO TABS
40.0000 mg | ORAL_TABLET | Freq: Every day | ORAL | 0 refills | Status: AC
  Filled 2023-06-18: qty 30, 30d supply, fill #0

## 2023-06-18 MED ORDER — CARVEDILOL 6.25 MG PO TABS
6.2500 mg | ORAL_TABLET | Freq: Two times a day (BID) | ORAL | 0 refills | Status: AC
  Filled 2023-06-18: qty 60, 30d supply, fill #0

## 2023-06-18 MED ORDER — AMLODIPINE BESYLATE 5 MG PO TABS
5.0000 mg | ORAL_TABLET | Freq: Every day | ORAL | 0 refills | Status: AC
Start: 2023-06-18 — End: ?
  Filled 2023-06-18: qty 30, 30d supply, fill #0

## 2023-06-19 ENCOUNTER — Ambulatory Visit: Payer: MEDICAID | Attending: Cardiology

## 2023-06-19 ENCOUNTER — Other Ambulatory Visit (HOSPITAL_COMMUNITY): Payer: Self-pay

## 2023-06-19 DIAGNOSIS — I4891 Unspecified atrial fibrillation: Secondary | ICD-10-CM | POA: Diagnosis not present

## 2023-06-19 LAB — POCT INR: INR: 2.3 (ref 2.0–3.0)

## 2023-06-19 NOTE — Patient Instructions (Signed)
 Description   Continue taking 1 tablet daily.  Recheck INR in 1 week Called to schedule overdue office visit with Dr Shirlee Latch  Coumadin Clinic [phone]

## 2023-06-26 ENCOUNTER — Ambulatory Visit: Payer: MEDICAID | Attending: Cardiology

## 2023-06-26 ENCOUNTER — Telehealth: Payer: Self-pay

## 2023-06-26 NOTE — Telephone Encounter (Signed)
 INR due. Pt missed scheduled coumadin clinic appt. Called pt, no answer. Left message on voicemail.

## 2023-10-09 ENCOUNTER — Ambulatory Visit: Payer: MEDICAID | Attending: Cardiology

## 2023-10-09 DIAGNOSIS — I4891 Unspecified atrial fibrillation: Secondary | ICD-10-CM | POA: Insufficient documentation

## 2023-10-09 LAB — POCT INR: INR: 3.9 — AB (ref 2.0–3.0)

## 2023-10-09 NOTE — Progress Notes (Signed)
 INR 3.9. Please see anticoagulation encounter

## 2023-10-09 NOTE — Patient Instructions (Signed)
 Description   HOLD today's dose and then resume taking 1 tablet daily.  Recheck INR in 1 week Called to schedule overdue office visit with Dr Rolan  Coumadin  Clinic [phone]

## 2023-10-16 ENCOUNTER — Ambulatory Visit: Payer: MEDICAID | Attending: Cardiology

## 2023-10-16 DIAGNOSIS — I4891 Unspecified atrial fibrillation: Secondary | ICD-10-CM | POA: Insufficient documentation

## 2023-10-16 LAB — POCT INR: INR: 1.5 — AB (ref 2.0–3.0)

## 2023-10-16 NOTE — Progress Notes (Signed)
 INR 1.5. Please see anticoagulation encounter

## 2023-10-16 NOTE — Patient Instructions (Signed)
 Description   Take 2 tablets today and then START taking 1 tablet daily except 2 tablets on Sundays.  Recheck INR in 1 week Called to schedule overdue office visit with Dr Rolan  Coumadin  Clinic [phone]

## 2023-10-19 ENCOUNTER — Other Ambulatory Visit: Payer: Self-pay

## 2023-10-22 ENCOUNTER — Other Ambulatory Visit (HOSPITAL_COMMUNITY): Payer: Self-pay

## 2023-10-22 ENCOUNTER — Other Ambulatory Visit: Payer: Self-pay

## 2023-10-23 ENCOUNTER — Other Ambulatory Visit (HOSPITAL_COMMUNITY): Payer: Self-pay

## 2023-10-23 ENCOUNTER — Other Ambulatory Visit: Payer: Self-pay

## 2023-10-23 ENCOUNTER — Ambulatory Visit: Payer: MEDICAID | Attending: Cardiology

## 2023-10-23 DIAGNOSIS — Z7901 Long term (current) use of anticoagulants: Secondary | ICD-10-CM | POA: Diagnosis present

## 2023-10-23 DIAGNOSIS — I4891 Unspecified atrial fibrillation: Secondary | ICD-10-CM | POA: Diagnosis present

## 2023-10-23 LAB — POCT INR: INR: 2.3 (ref 2.0–3.0)

## 2023-10-23 MED ORDER — WARFARIN SODIUM 2 MG PO TABS: ORAL_TABLET | ORAL | 1 refills | Status: DC

## 2023-10-23 NOTE — Progress Notes (Signed)
 INR 2.3. Please see anticoagulation encounter

## 2023-10-23 NOTE — Patient Instructions (Signed)
 Description   Take 1.5 tablets today and then resume taking 1 tablet daily except 2 tablets on Sundays.  Recheck INR in 1 week Called to schedule overdue office visit with Dr Rolan  Coumadin  Clinic [phone]

## 2023-10-30 ENCOUNTER — Ambulatory Visit: Payer: MEDICAID | Attending: Cardiology

## 2023-10-30 DIAGNOSIS — I4891 Unspecified atrial fibrillation: Secondary | ICD-10-CM | POA: Diagnosis present

## 2023-10-30 LAB — POCT INR: INR: 3.4 — AB (ref 2.0–3.0)

## 2023-10-30 NOTE — Progress Notes (Signed)
 INR 3.4  Please see anticoagulation encounter

## 2023-10-30 NOTE — Patient Instructions (Signed)
 Description   START taking 1 tablet daily except 1.5 tablets on Sundays.  Recheck INR in 2 weeks Called to schedule overdue office visit with Dr Rolan  [facility] [phone]

## 2023-11-13 ENCOUNTER — Ambulatory Visit: Payer: MEDICAID | Attending: Cardiology

## 2023-11-13 DIAGNOSIS — I4891 Unspecified atrial fibrillation: Secondary | ICD-10-CM | POA: Insufficient documentation

## 2023-11-13 DIAGNOSIS — I639 Cerebral infarction, unspecified: Secondary | ICD-10-CM | POA: Insufficient documentation

## 2023-11-13 DIAGNOSIS — Z7901 Long term (current) use of anticoagulants: Secondary | ICD-10-CM | POA: Diagnosis present

## 2023-11-13 DIAGNOSIS — Z952 Presence of prosthetic heart valve: Secondary | ICD-10-CM | POA: Diagnosis present

## 2023-11-13 LAB — POCT INR: INR: 1.8 — AB (ref 2.0–3.0)

## 2023-11-13 NOTE — Patient Instructions (Signed)
 Take 2 tablets today only then continue taking 1 tablet daily except 1.5 tablets on Sundays.  Recheck INR in 4 weeks  [facility] [phone]

## 2023-11-14 ENCOUNTER — Other Ambulatory Visit (HOSPITAL_COMMUNITY): Payer: Self-pay

## 2023-11-23 ENCOUNTER — Other Ambulatory Visit (HOSPITAL_COMMUNITY): Payer: Self-pay

## 2023-11-27 ENCOUNTER — Other Ambulatory Visit (HOSPITAL_COMMUNITY): Payer: Self-pay

## 2023-12-03 ENCOUNTER — Other Ambulatory Visit: Payer: Self-pay | Admitting: Cardiology

## 2023-12-03 DIAGNOSIS — Z7901 Long term (current) use of anticoagulants: Secondary | ICD-10-CM

## 2023-12-03 DIAGNOSIS — I4891 Unspecified atrial fibrillation: Secondary | ICD-10-CM

## 2023-12-03 DIAGNOSIS — Z952 Presence of prosthetic heart valve: Secondary | ICD-10-CM

## 2023-12-03 NOTE — Telephone Encounter (Signed)
 Warfarin 2mg  refill S/P MVR (mitral valve replacement), dvt, cva Last INR 11/13/23 Last OV 05/28/23

## 2023-12-11 ENCOUNTER — Ambulatory Visit: Payer: MEDICAID | Attending: Cardiology

## 2023-12-11 DIAGNOSIS — I639 Cerebral infarction, unspecified: Secondary | ICD-10-CM | POA: Insufficient documentation

## 2023-12-11 DIAGNOSIS — I4891 Unspecified atrial fibrillation: Secondary | ICD-10-CM | POA: Diagnosis present

## 2023-12-11 DIAGNOSIS — Z7901 Long term (current) use of anticoagulants: Secondary | ICD-10-CM | POA: Insufficient documentation

## 2023-12-11 DIAGNOSIS — Z952 Presence of prosthetic heart valve: Secondary | ICD-10-CM | POA: Diagnosis present

## 2023-12-11 LAB — POCT INR: INR: 1.7 — AB (ref 2.0–3.0)

## 2023-12-11 NOTE — Patient Instructions (Signed)
 Increase to 1 tablet daily except 1.5 tablets on Mondays, Wednesdays and Fridays.  Recheck INR in 3 weeks  [facility] [phone]

## 2024-01-01 ENCOUNTER — Ambulatory Visit: Payer: MEDICAID | Attending: Cardiology

## 2024-01-01 DIAGNOSIS — I639 Cerebral infarction, unspecified: Secondary | ICD-10-CM | POA: Insufficient documentation

## 2024-01-01 DIAGNOSIS — Z952 Presence of prosthetic heart valve: Secondary | ICD-10-CM | POA: Insufficient documentation

## 2024-01-01 DIAGNOSIS — Z7901 Long term (current) use of anticoagulants: Secondary | ICD-10-CM | POA: Diagnosis present

## 2024-01-01 DIAGNOSIS — I4891 Unspecified atrial fibrillation: Secondary | ICD-10-CM | POA: Insufficient documentation

## 2024-01-01 LAB — POCT INR: INR: 2.8 (ref 2.0–3.0)

## 2024-01-01 NOTE — Patient Instructions (Signed)
 Continue 1 tablet daily except 1.5 tablets on Mondays, Wednesdays and Fridays.  Recheck INR in 5 weeks  [facility] [phone]

## 2024-01-18 ENCOUNTER — Other Ambulatory Visit: Payer: Self-pay | Admitting: Cardiology

## 2024-01-18 DIAGNOSIS — Z952 Presence of prosthetic heart valve: Secondary | ICD-10-CM

## 2024-01-18 DIAGNOSIS — Z7901 Long term (current) use of anticoagulants: Secondary | ICD-10-CM

## 2024-01-18 DIAGNOSIS — I4891 Unspecified atrial fibrillation: Secondary | ICD-10-CM

## 2024-02-04 ENCOUNTER — Telehealth (HOSPITAL_COMMUNITY): Payer: Self-pay

## 2024-02-04 ENCOUNTER — Other Ambulatory Visit (HOSPITAL_COMMUNITY): Payer: Self-pay

## 2024-02-04 ENCOUNTER — Other Ambulatory Visit: Payer: Self-pay

## 2024-02-04 NOTE — Telephone Encounter (Signed)
 Insurance requires brand, PA not needed at this time.

## 2024-02-05 ENCOUNTER — Ambulatory Visit: Payer: MEDICAID

## 2024-02-12 ENCOUNTER — Ambulatory Visit: Payer: MEDICAID | Attending: Cardiology

## 2024-02-12 DIAGNOSIS — Z952 Presence of prosthetic heart valve: Secondary | ICD-10-CM | POA: Diagnosis present

## 2024-02-12 DIAGNOSIS — I4891 Unspecified atrial fibrillation: Secondary | ICD-10-CM | POA: Diagnosis present

## 2024-02-12 DIAGNOSIS — I639 Cerebral infarction, unspecified: Secondary | ICD-10-CM | POA: Insufficient documentation

## 2024-02-12 DIAGNOSIS — Z7901 Long term (current) use of anticoagulants: Secondary | ICD-10-CM | POA: Diagnosis present

## 2024-02-12 LAB — POCT INR: INR: 4.8 — AB (ref 2.0–3.0)

## 2024-02-12 NOTE — Patient Instructions (Signed)
 Hold tomorrow only then Continue 1 tablet daily except 1.5 tablets on Mondays, Wednesdays and Fridays.  Recheck INR in 4 weeks  [facility] [phone]

## 2024-03-03 ENCOUNTER — Other Ambulatory Visit (HOSPITAL_COMMUNITY): Payer: Self-pay | Admitting: Cardiology

## 2024-03-03 DIAGNOSIS — Z952 Presence of prosthetic heart valve: Secondary | ICD-10-CM

## 2024-03-03 DIAGNOSIS — I4891 Unspecified atrial fibrillation: Secondary | ICD-10-CM

## 2024-03-03 DIAGNOSIS — Z7901 Long term (current) use of anticoagulants: Secondary | ICD-10-CM

## 2024-03-03 MED ORDER — WARFARIN SODIUM 2 MG PO TABS
2.0000 mg | ORAL_TABLET | Freq: Every day | ORAL | 0 refills | Status: AC
  Filled 2024-03-03: qty 60, 30d supply, fill #0

## 2024-03-03 NOTE — Telephone Encounter (Signed)
 Patient called to request refill on coumadin    Message to coumadin  clinic to ensure correct dose is sent over   [facility] [place]

## 2024-03-04 ENCOUNTER — Other Ambulatory Visit (HOSPITAL_COMMUNITY): Payer: Self-pay

## 2024-03-11 ENCOUNTER — Ambulatory Visit: Payer: MEDICAID | Attending: Cardiology

## 2024-03-11 DIAGNOSIS — Z952 Presence of prosthetic heart valve: Secondary | ICD-10-CM | POA: Insufficient documentation

## 2024-03-11 DIAGNOSIS — I639 Cerebral infarction, unspecified: Secondary | ICD-10-CM | POA: Insufficient documentation

## 2024-03-11 DIAGNOSIS — I4891 Unspecified atrial fibrillation: Secondary | ICD-10-CM | POA: Diagnosis present

## 2024-03-11 DIAGNOSIS — Z7901 Long term (current) use of anticoagulants: Secondary | ICD-10-CM | POA: Diagnosis present

## 2024-03-11 LAB — POCT INR: INR: 3.7 — AB (ref 2.0–3.0)

## 2024-03-11 NOTE — Patient Instructions (Signed)
 Continue 1 tablet daily except 1.5 tablets on Mondays, Wednesdays and Fridays. Eat greens tonight or tomorrow. Recheck INR in 4 weeks  [facility] [phone]

## 2024-03-26 ENCOUNTER — Other Ambulatory Visit (HOSPITAL_COMMUNITY): Payer: Self-pay

## 2024-03-26 ENCOUNTER — Other Ambulatory Visit (HOSPITAL_COMMUNITY): Payer: Self-pay | Admitting: Cardiology

## 2024-03-26 DIAGNOSIS — J4520 Mild intermittent asthma, uncomplicated: Secondary | ICD-10-CM

## 2024-03-26 MED ORDER — ALBUTEROL SULFATE HFA 108 (90 BASE) MCG/ACT IN AERS
2.0000 | INHALATION_SPRAY | Freq: Four times a day (QID) | RESPIRATORY_TRACT | 2 refills | Status: AC | PRN
  Filled 2024-03-26: qty 6.7, 25d supply, fill #0

## 2024-04-08 ENCOUNTER — Ambulatory Visit: Payer: MEDICAID | Attending: Cardiology

## 2024-04-08 DIAGNOSIS — I4891 Unspecified atrial fibrillation: Secondary | ICD-10-CM

## 2024-04-08 DIAGNOSIS — Z952 Presence of prosthetic heart valve: Secondary | ICD-10-CM

## 2024-04-08 DIAGNOSIS — Z7901 Long term (current) use of anticoagulants: Secondary | ICD-10-CM

## 2024-04-08 DIAGNOSIS — I639 Cerebral infarction, unspecified: Secondary | ICD-10-CM

## 2024-04-08 LAB — POCT INR: INR: 2.9 (ref 2.0–3.0)

## 2024-04-08 NOTE — Patient Instructions (Signed)
 Continue 1 tablet daily except 1.5 tablets on Mondays, Wednesdays and Fridays.  Recheck INR in 6 weeks  [facility] [phone]

## 2024-05-20 ENCOUNTER — Ambulatory Visit: Payer: Self-pay
